# Patient Record
Sex: Female | Born: 1980 | Race: White | Hispanic: No | Marital: Married | Smoking: Never smoker
Health system: Southern US, Community
[De-identification: ages and names within clinical notes are randomized; demographics above are authoritative.]

## PROBLEM LIST (undated history)

## (undated) ENCOUNTER — Inpatient Hospital Stay (HOSPITAL_COMMUNITY): Payer: Self-pay

## (undated) DIAGNOSIS — R0602 Shortness of breath: Secondary | ICD-10-CM

## (undated) DIAGNOSIS — Z950 Presence of cardiac pacemaker: Secondary | ICD-10-CM

## (undated) DIAGNOSIS — F329 Major depressive disorder, single episode, unspecified: Secondary | ICD-10-CM

## (undated) DIAGNOSIS — E039 Hypothyroidism, unspecified: Secondary | ICD-10-CM

## (undated) DIAGNOSIS — M549 Dorsalgia, unspecified: Secondary | ICD-10-CM

## (undated) DIAGNOSIS — Z8719 Personal history of other diseases of the digestive system: Secondary | ICD-10-CM

## (undated) DIAGNOSIS — Z87898 Personal history of other specified conditions: Secondary | ICD-10-CM

## (undated) DIAGNOSIS — F439 Reaction to severe stress, unspecified: Secondary | ICD-10-CM

## (undated) DIAGNOSIS — N201 Calculus of ureter: Secondary | ICD-10-CM

## (undated) DIAGNOSIS — K219 Gastro-esophageal reflux disease without esophagitis: Secondary | ICD-10-CM

## (undated) DIAGNOSIS — I471 Supraventricular tachycardia, unspecified: Secondary | ICD-10-CM

## (undated) DIAGNOSIS — E785 Hyperlipidemia, unspecified: Secondary | ICD-10-CM

## (undated) DIAGNOSIS — R519 Headache, unspecified: Secondary | ICD-10-CM

## (undated) DIAGNOSIS — N200 Calculus of kidney: Secondary | ICD-10-CM

## (undated) DIAGNOSIS — I10 Essential (primary) hypertension: Secondary | ICD-10-CM

## (undated) DIAGNOSIS — G4730 Sleep apnea, unspecified: Secondary | ICD-10-CM

## (undated) DIAGNOSIS — R002 Palpitations: Secondary | ICD-10-CM

## (undated) DIAGNOSIS — Z973 Presence of spectacles and contact lenses: Secondary | ICD-10-CM

## (undated) DIAGNOSIS — F419 Anxiety disorder, unspecified: Secondary | ICD-10-CM

## (undated) DIAGNOSIS — Z9289 Personal history of other medical treatment: Secondary | ICD-10-CM

## (undated) DIAGNOSIS — Z8669 Personal history of other diseases of the nervous system and sense organs: Secondary | ICD-10-CM

## (undated) DIAGNOSIS — K829 Disease of gallbladder, unspecified: Secondary | ICD-10-CM

## (undated) DIAGNOSIS — G479 Sleep disorder, unspecified: Secondary | ICD-10-CM

## (undated) DIAGNOSIS — I319 Disease of pericardium, unspecified: Secondary | ICD-10-CM

## (undated) DIAGNOSIS — R55 Syncope and collapse: Secondary | ICD-10-CM

## (undated) DIAGNOSIS — Z87442 Personal history of urinary calculi: Secondary | ICD-10-CM

## (undated) DIAGNOSIS — R12 Heartburn: Secondary | ICD-10-CM

## (undated) DIAGNOSIS — F32A Depression, unspecified: Secondary | ICD-10-CM

## (undated) DIAGNOSIS — R51 Headache: Secondary | ICD-10-CM

## (undated) DIAGNOSIS — M940 Chondrocostal junction syndrome [Tietze]: Secondary | ICD-10-CM

## (undated) HISTORY — DX: Sleep disorder, unspecified: G47.9

## (undated) HISTORY — DX: Syncope and collapse: R55

## (undated) HISTORY — DX: Personal history of other diseases of the nervous system and sense organs: Z86.69

## (undated) HISTORY — DX: Palpitations: R00.2

## (undated) HISTORY — DX: Heartburn: R12

## (undated) HISTORY — DX: Shortness of breath: R06.02

## (undated) HISTORY — DX: Calculus of kidney: N20.0

## (undated) HISTORY — DX: Personal history of other diseases of the digestive system: Z87.19

## (undated) HISTORY — DX: Anxiety disorder, unspecified: F41.9

## (undated) HISTORY — PX: ABDOMINAL HYSTERECTOMY: SHX81

## (undated) HISTORY — DX: Essential (primary) hypertension: I10

## (undated) HISTORY — DX: Personal history of other medical treatment: Z92.89

## (undated) HISTORY — DX: Supraventricular tachycardia, unspecified: I47.10

## (undated) HISTORY — DX: Gastro-esophageal reflux disease without esophagitis: K21.9

## (undated) HISTORY — PX: CHOLECYSTECTOMY: SHX55

## (undated) HISTORY — DX: Supraventricular tachycardia: I47.1

## (undated) HISTORY — PX: WISDOM TOOTH EXTRACTION: SHX21

## (undated) HISTORY — DX: Dorsalgia, unspecified: M54.9

## (undated) HISTORY — DX: Chondrocostal junction syndrome (tietze): M94.0

## (undated) HISTORY — DX: Disease of pericardium, unspecified: I31.9

## (undated) HISTORY — DX: Reaction to severe stress, unspecified: F43.9

## (undated) HISTORY — DX: Personal history of other specified conditions: Z87.898

## (undated) HISTORY — DX: Disease of gallbladder, unspecified: K82.9

## (undated) HISTORY — DX: Hyperlipidemia, unspecified: E78.5

## (undated) SURGERY — Surgical Case
Anesthesia: *Unknown

---

## 1998-11-24 ENCOUNTER — Other Ambulatory Visit: Admission: RE | Admit: 1998-11-24 | Discharge: 1998-11-24 | Payer: Self-pay | Admitting: Obstetrics and Gynecology

## 2000-01-05 ENCOUNTER — Other Ambulatory Visit: Admission: RE | Admit: 2000-01-05 | Discharge: 2000-01-05 | Payer: Self-pay | Admitting: Obstetrics and Gynecology

## 2000-07-18 ENCOUNTER — Ambulatory Visit (HOSPITAL_COMMUNITY): Admission: RE | Admit: 2000-07-18 | Discharge: 2000-07-18 | Payer: Self-pay | Admitting: *Deleted

## 2001-02-28 ENCOUNTER — Other Ambulatory Visit: Admission: RE | Admit: 2001-02-28 | Discharge: 2001-02-28 | Payer: Self-pay | Admitting: Obstetrics and Gynecology

## 2001-07-12 ENCOUNTER — Emergency Department (HOSPITAL_COMMUNITY): Admission: EM | Admit: 2001-07-12 | Discharge: 2001-07-12 | Payer: Self-pay

## 2002-06-05 ENCOUNTER — Other Ambulatory Visit: Admission: RE | Admit: 2002-06-05 | Discharge: 2002-06-05 | Payer: Self-pay | Admitting: Obstetrics and Gynecology

## 2003-07-04 ENCOUNTER — Emergency Department (HOSPITAL_COMMUNITY): Admission: EM | Admit: 2003-07-04 | Discharge: 2003-07-04 | Payer: Self-pay | Admitting: Family Medicine

## 2003-07-12 ENCOUNTER — Emergency Department (HOSPITAL_COMMUNITY): Admission: EM | Admit: 2003-07-12 | Discharge: 2003-07-12 | Payer: Self-pay | Admitting: Emergency Medicine

## 2003-09-16 ENCOUNTER — Ambulatory Visit (HOSPITAL_COMMUNITY): Admission: RE | Admit: 2003-09-16 | Discharge: 2003-09-16 | Payer: Self-pay | Admitting: Family Medicine

## 2004-04-24 ENCOUNTER — Other Ambulatory Visit: Admission: RE | Admit: 2004-04-24 | Discharge: 2004-04-24 | Payer: Self-pay | Admitting: Obstetrics and Gynecology

## 2004-11-15 ENCOUNTER — Encounter (HOSPITAL_COMMUNITY): Admission: RE | Admit: 2004-11-15 | Discharge: 2004-12-13 | Payer: Self-pay | Admitting: Family Medicine

## 2004-11-18 ENCOUNTER — Emergency Department (HOSPITAL_COMMUNITY): Admission: EM | Admit: 2004-11-18 | Discharge: 2004-11-18 | Payer: Self-pay | Admitting: Emergency Medicine

## 2005-02-02 ENCOUNTER — Emergency Department (HOSPITAL_COMMUNITY): Admission: EM | Admit: 2005-02-02 | Discharge: 2005-02-02 | Payer: Self-pay | Admitting: Family Medicine

## 2005-05-04 ENCOUNTER — Other Ambulatory Visit: Admission: RE | Admit: 2005-05-04 | Discharge: 2005-05-04 | Payer: Self-pay | Admitting: Obstetrics and Gynecology

## 2005-06-07 ENCOUNTER — Encounter: Admission: RE | Admit: 2005-06-07 | Discharge: 2005-06-07 | Payer: Self-pay | Admitting: Obstetrics and Gynecology

## 2005-07-11 ENCOUNTER — Emergency Department (HOSPITAL_COMMUNITY): Admission: EM | Admit: 2005-07-11 | Discharge: 2005-07-11 | Payer: Self-pay | Admitting: Emergency Medicine

## 2005-07-17 ENCOUNTER — Ambulatory Visit (HOSPITAL_COMMUNITY): Admission: RE | Admit: 2005-07-17 | Discharge: 2005-07-17 | Payer: Self-pay | Admitting: *Deleted

## 2005-07-17 HISTORY — PX: OTHER SURGICAL HISTORY: SHX169

## 2005-07-31 ENCOUNTER — Ambulatory Visit: Payer: Self-pay | Admitting: Internal Medicine

## 2006-04-09 ENCOUNTER — Emergency Department (HOSPITAL_COMMUNITY): Admission: EM | Admit: 2006-04-09 | Discharge: 2006-04-09 | Payer: Self-pay | Admitting: Emergency Medicine

## 2006-07-15 ENCOUNTER — Ambulatory Visit (HOSPITAL_COMMUNITY): Admission: RE | Admit: 2006-07-15 | Discharge: 2006-07-15 | Payer: Self-pay | Admitting: Obstetrics and Gynecology

## 2006-07-25 ENCOUNTER — Inpatient Hospital Stay (HOSPITAL_COMMUNITY): Admission: AD | Admit: 2006-07-25 | Discharge: 2006-07-25 | Payer: Self-pay | Admitting: Obstetrics and Gynecology

## 2006-09-22 ENCOUNTER — Inpatient Hospital Stay (HOSPITAL_COMMUNITY): Admission: AD | Admit: 2006-09-22 | Discharge: 2006-09-22 | Payer: Self-pay | Admitting: Obstetrics and Gynecology

## 2006-12-18 ENCOUNTER — Inpatient Hospital Stay (HOSPITAL_COMMUNITY): Admission: AD | Admit: 2006-12-18 | Discharge: 2006-12-21 | Payer: Self-pay | Admitting: Obstetrics and Gynecology

## 2006-12-24 ENCOUNTER — Ambulatory Visit: Admission: RE | Admit: 2006-12-24 | Discharge: 2006-12-24 | Payer: Self-pay | Admitting: Obstetrics and Gynecology

## 2007-07-06 ENCOUNTER — Emergency Department (HOSPITAL_COMMUNITY): Admission: EM | Admit: 2007-07-06 | Discharge: 2007-07-06 | Payer: Self-pay | Admitting: Family Medicine

## 2007-11-24 HISTORY — PX: CHOLECYSTECTOMY: SHX55

## 2007-12-10 ENCOUNTER — Encounter (INDEPENDENT_AMBULATORY_CARE_PROVIDER_SITE_OTHER): Payer: Self-pay | Admitting: General Surgery

## 2007-12-10 ENCOUNTER — Ambulatory Visit (HOSPITAL_COMMUNITY): Admission: RE | Admit: 2007-12-10 | Discharge: 2007-12-10 | Payer: Self-pay | Admitting: General Surgery

## 2007-12-18 ENCOUNTER — Encounter: Admission: RE | Admit: 2007-12-18 | Discharge: 2007-12-18 | Payer: Self-pay | Admitting: General Surgery

## 2007-12-19 ENCOUNTER — Encounter: Admission: RE | Admit: 2007-12-19 | Discharge: 2007-12-19 | Payer: Self-pay | Admitting: General Surgery

## 2007-12-24 ENCOUNTER — Ambulatory Visit (HOSPITAL_COMMUNITY): Admission: RE | Admit: 2007-12-24 | Discharge: 2007-12-24 | Payer: Self-pay | Admitting: Gastroenterology

## 2008-01-25 ENCOUNTER — Emergency Department (HOSPITAL_BASED_OUTPATIENT_CLINIC_OR_DEPARTMENT_OTHER): Admission: EM | Admit: 2008-01-25 | Discharge: 2008-01-25 | Payer: Self-pay | Admitting: Emergency Medicine

## 2010-07-16 ENCOUNTER — Encounter: Payer: Self-pay | Admitting: Obstetrics and Gynecology

## 2010-07-17 ENCOUNTER — Encounter: Payer: Self-pay | Admitting: Gastroenterology

## 2010-09-13 ENCOUNTER — Ambulatory Visit (INDEPENDENT_AMBULATORY_CARE_PROVIDER_SITE_OTHER): Payer: Self-pay | Admitting: Cardiovascular Disease

## 2010-09-13 ENCOUNTER — Encounter: Payer: Self-pay | Admitting: Cardiovascular Disease

## 2010-09-13 ENCOUNTER — Ambulatory Visit: Payer: Self-pay | Admitting: *Deleted

## 2010-09-13 ENCOUNTER — Encounter: Payer: Self-pay | Admitting: *Deleted

## 2010-09-13 VITALS — BP 126/88 | HR 64 | Wt 175.0 lb

## 2010-09-13 DIAGNOSIS — I319 Disease of pericardium, unspecified: Secondary | ICD-10-CM

## 2010-09-13 DIAGNOSIS — I10 Essential (primary) hypertension: Secondary | ICD-10-CM | POA: Insufficient documentation

## 2010-09-13 DIAGNOSIS — M940 Chondrocostal junction syndrome [Tietze]: Secondary | ICD-10-CM

## 2010-09-13 DIAGNOSIS — R55 Syncope and collapse: Secondary | ICD-10-CM

## 2010-09-13 HISTORY — DX: Disease of pericardium, unspecified: I31.9

## 2010-09-13 HISTORY — DX: Chondrocostal junction syndrome (tietze): M94.0

## 2010-09-13 MED ORDER — HYDROCHLOROTHIAZIDE 12.5 MG PO CAPS: 12.5000 mg | ORAL_CAPSULE | Freq: Every day | ORAL | Status: DC

## 2010-09-13 NOTE — Progress Notes (Signed)
Subjective: Carmen [name] is a young female who we are asked to see today for episodes of chest pain. She is a previous patient of Dr. Reyes Ivan.She started having episodes of chest pain several days ago. The chest pain was described as a heaviness and was centered around her left breast. She originally thought she had some breast tenderness and started taking some vitamin E. The pain has persisted although it is a little bit better. It is exacerbated by lying back and is easily she leans forwards.It was associated with dyspnea.  She denies any PND or orthopnea. She denies any syncope or presyncope.  Current Outpatient Prescriptions  Medication Sig Dispense Refill  . aspirin 81 MG tablet Take 81 mg by mouth daily.        . calcium-vitamin D (OSCAL WITH D) 500-200 MG-UNIT per tablet Take 1 tablet by mouth 2 (two) times daily.        . diclofenac (VOLTAREN) 75 MG EC tablet Take 75 mg by mouth 2 (two) times daily.        . fish oil-omega-3 fatty acids 1000 MG capsule Take 2 g by mouth 3 (three) times daily.        . metoprolol (TOPROL-XL) 50 MG 24 hr tablet Take 50 mg by mouth daily.        . multivitamin (THERAGRAN) per tablet Take 1 tablet by mouth daily.        Marland Kitchen omeprazole (PRILOSEC) 40 MG capsule Take 40 mg by mouth daily.        . SUMAtriptan (IMITREX) 50 MG tablet Take 50 mg by mouth every 2 (two) hours as needed.        . vitamin E 400 UNIT capsule Take 400 Units by mouth daily.        Marland Kitchen DISCONTD: carvedilol (COREG) 12.5 MG tablet Take 12.5 mg by mouth 2 (two) times daily with a meal.         Allergies  Allergen Reactions  . Quinolones Nausea Only    Patient Active Problem List  Diagnoses  . Pericarditis  . Hypertension  . Syncope  . Costochondritis    History  Smoking status  . Never Smoker   Smokeless tobacco  . Not on file    History  Alcohol Use No    Family History  Problem Relation Age of Onset  . Hypertension Mother   . Heart disease Mother   . Heart attack Father    . Hyperlipidemia Father     Review of Systems: The patient denies any heat or cold intolerance.  No weight gain or weight loss.  The patient denies headaches or blurry vision.  There is no cough or sputum production.  The patient denies dizziness.  There is no hematuria or hematochezia.  The patient denies any muscle aches or arthritis.  The patient denies any rash.  The patient denies frequent falling or instability.  There is no history of depression or anxiety.  All other systems were reviewed and are negative.  Physical Exam: The patient is alert and oriented x 3.  The mood and affect are normal.  The skin is warm and dry.  Color is normal.  The HEENT exam reveals that the sclera are nonicteric.  The mucous membranes are moist.  The carotids are 2+ without bruits.  There is no thyromegaly.  There is no JVD.  The lungs are clear.  The chest wall Is very tender especially along her sternum.  The heart exam reveals a  regular rate with a normal S1 and S2.  There are no murmurs, gallops, or rubs.  The PMI is not displaced.   Abdominal exam reveals good bowel sounds.  There is no guarding or rebound.  There is no hepatosplenomegaly or tenderness.  There are no masses.  Exam of the legs reveal no clubbing, cyanosis, or edema.  The legs are without rashes.  The distal pulses are intact.  Cranial nerves II - XII are intact.  Motor and sensory functions are intact.  The gait is normal.  ECG: Normal sinus rhythm. She has no ST or T wave changes.  Assessment / Plan:

## 2010-09-13 NOTE — Assessment & Plan Note (Signed)
Carmen [name] presents with chest wall tenderness and I suspect that she has costochondritis.Her symptoms certainly sound like pericarditis from last week although she does not have any PR depression and ST segment elevation on her EKG today. I suspect that this costochondritis will gradually resolve. She is already on Voltaren. I'll see her back in a month.

## 2010-09-13 NOTE — Assessment & Plan Note (Signed)
Her diastolic pressure is a little bit high today and she states that it typically is in the 90 range. We'll add HCTZ 12.5 mg a day. I'll see her back in one month for an office visit and complete metabolic profile.

## 2010-09-13 NOTE — Assessment & Plan Note (Signed)
She had symptoms consistent with pericarditis last week. I think that her chest pain today is no due to costochondritis. We'll continue to follow her closely for this.

## 2010-10-12 ENCOUNTER — Other Ambulatory Visit: Payer: Self-pay | Admitting: *Deleted

## 2010-10-12 DIAGNOSIS — E7800 Pure hypercholesterolemia, unspecified: Secondary | ICD-10-CM

## 2010-10-13 ENCOUNTER — Ambulatory Visit: Payer: Self-pay | Admitting: Cardiovascular Disease

## 2010-10-16 ENCOUNTER — Other Ambulatory Visit (INDEPENDENT_AMBULATORY_CARE_PROVIDER_SITE_OTHER): Payer: Managed Care, Other (non HMO) | Admitting: *Deleted

## 2010-10-16 ENCOUNTER — Ambulatory Visit (INDEPENDENT_AMBULATORY_CARE_PROVIDER_SITE_OTHER): Payer: Managed Care, Other (non HMO) | Admitting: Cardiovascular Disease

## 2010-10-16 ENCOUNTER — Encounter: Payer: Self-pay | Admitting: Cardiovascular Disease

## 2010-10-16 ENCOUNTER — Other Ambulatory Visit (INDEPENDENT_AMBULATORY_CARE_PROVIDER_SITE_OTHER): Payer: PRIVATE HEALTH INSURANCE | Admitting: Cardiovascular Disease

## 2010-10-16 VITALS — BP 120/90 | HR 86 | Ht 63.0 in | Wt 177.4 lb

## 2010-10-16 DIAGNOSIS — E785 Hyperlipidemia, unspecified: Secondary | ICD-10-CM

## 2010-10-16 DIAGNOSIS — E7800 Pure hypercholesterolemia, unspecified: Secondary | ICD-10-CM

## 2010-10-16 DIAGNOSIS — I10 Essential (primary) hypertension: Secondary | ICD-10-CM

## 2010-10-16 LAB — BASIC METABOLIC PANEL
BUN: 18 mg/dL (ref 6–23)
CO2: 29 mEq/L (ref 19–32)
Calcium: 9.6 mg/dL (ref 8.4–10.5)
Chloride: 100 mEq/L (ref 96–112)
Creatinine, Ser: 0.7 mg/dL (ref 0.4–1.2)
GFR: 112.56 mL/min (ref >60.00)
Glucose, Bld: 80 mg/dL (ref 70–99)
Potassium: 4 mEq/L (ref 3.5–5.1)
Sodium: 136 mEq/L (ref 135–145)

## 2010-10-16 LAB — HEPATIC FUNCTION PANEL
ALT: 20 U/L (ref 0–35)
AST: 22 U/L (ref 0–37)
Albumin: 4.2 g/dL (ref 3.5–5.2)
Alkaline Phosphatase: 56 U/L (ref 39–117)
Bilirubin, Direct: 0 mg/dL (ref 0.0–0.3)
Total Bilirubin: 0.6 mg/dL (ref 0.3–1.2)
Total Protein: 7.5 g/dL (ref 6.0–8.3)

## 2010-10-16 LAB — LIPID PANEL
Cholesterol: 203 mg/dL — ABNORMAL HIGH (ref 0–200)
HDL: 36.7 mg/dL — ABNORMAL LOW (ref >39.00)
Total CHOL/HDL Ratio: 6
Triglycerides: 231 mg/dL — ABNORMAL HIGH (ref 0.0–149.0)
VLDL: 46.2 mg/dL — ABNORMAL HIGH (ref 0.0–40.0)

## 2010-10-16 LAB — LDL CHOLESTEROL, DIRECT: Direct LDL: 136.1 mg/dL

## 2010-10-16 MED ORDER — POTASSIUM CHLORIDE ER 10 MEQ PO TBCR: 10.0000 meq | EXTENDED_RELEASE_TABLET | Freq: Two times a day (BID) | ORAL | Status: DC

## 2010-10-16 MED ORDER — HYDROCHLOROTHIAZIDE 25 MG PO TABS: 25.0000 mg | ORAL_TABLET | Freq: Every day | ORAL | Status: DC

## 2010-10-16 NOTE — Assessment & Plan Note (Signed)
Carmen [name] presents with some persistently elevated blood pressure.  She's been on low-dose HCTZ for the past month.  I would like to check a basic metabolic profile today as well as a lipid profile and hepatic profile.  We will increase her HCTZ to 25 mg a day. We will also add potassium chloride 10 mEq a day. I'll see her back in the office in 3 months for office visit and basic metabolic profile.

## 2010-10-16 NOTE — Progress Notes (Signed)
[name] Date of Birth  May 30, 1981 [facility] [address]     [address] [place], [place]  [address] [phone]  Fax  [phone]  History of Present Illness:  Carmen [name]  is a young female with a history of hypertension and history of chest pains.  She has not had any further episodes of chest pain since I last saw her a month ago. Her blood pressure has remained a little bit elevated. She continues to walk at lunch about 1.2 miles every day. She complains of back and leg pain.   Current Outpatient Prescriptions on File Prior to Visit  Medication Sig Dispense Refill  . aspirin 81 MG tablet Take 81 mg by mouth daily.        . calcium-vitamin D (OSCAL WITH D) 500-200 MG-UNIT per tablet Take 1 tablet by mouth 2 (two) times daily.        . diclofenac (VOLTAREN) 75 MG EC tablet Take 75 mg by mouth 2 (two) times daily.        . fish oil-omega-3 fatty acids 1000 MG capsule Take 2 g by mouth 3 (three) times daily.        . metoprolol (TOPROL-XL) 50 MG 24 hr tablet Take 50 mg by mouth daily.        . multivitamin (THERAGRAN) per tablet Take 1 tablet by mouth daily.        Marland Kitchen omeprazole (PRILOSEC) 40 MG capsule Take 40 mg by mouth daily.        . SUMAtriptan (IMITREX) 50 MG tablet Take 50 mg by mouth every 2 (two) hours as needed.        . vitamin E 400 UNIT capsule Take 400 Units by mouth daily.        Marland Kitchen DISCONTD: hydrochlorothiazide (,MICROZIDE/HYDRODIURIL,) 12.5 MG capsule Take 1 capsule (12.5 mg total) by mouth daily.  30 capsule  11    Allergies  Allergen Reactions  . Quinolones Nausea Only    Past Medical History  Diagnosis Date  . Syncope and collapse     neurocardiogenic  . Hypertension     Past Surgical History  Procedure Date  . Cholecystectomy     11/2007    History  Smoking status  . Never Smoker   Smokeless tobacco  . Not on file    History  Alcohol Use No    Family History  Problem Relation Age of Onset  .  Hypertension Mother   . Heart disease Mother   . Heart attack Father   . Hyperlipidemia Father     Reviw of Systems:  Reviewed in the HPI.  All other systems are negative.  Physical Exam: BP 120/90  Pulse 86  Ht 5\' 3"  (1.6 m)  Wt 177 lb 6.4 oz (80.468 kg)  BMI 31.42 kg/m2  LMP 10/07/2010 The patient is alert and oriented x 3.  The mood and affect are normal.  The skin is warm and dry.  Color is normal.  The HEENT exam reveals that the sclera are nonicteric.  The mucous membranes are moist.  The carotids are 2+ without bruits.  There is no thyromegaly.  There is no JVD.  The lungs are clear.  The chest wall is non tender.  The heart exam reveals a regular rate with a normal S1 and S2.  There are no murmurs, gallops, or rubs.  The PMI is not displaced.   Abdominal exam reveals good bowel sounds.  There is no guarding or rebound.  There is no hepatosplenomegaly or tenderness.  There are no masses.  Exam of the legs reveal no clubbing, cyanosis, or edema.  The legs are without rashes.  The distal pulses are intact.  Cranial nerves II - XII are intact.  Motor and sensory functions are intact.  The gait is normal.  Assessment / Plan:

## 2010-10-17 ENCOUNTER — Telehealth: Payer: Self-pay | Admitting: *Deleted

## 2010-10-17 DIAGNOSIS — E785 Hyperlipidemia, unspecified: Secondary | ICD-10-CM

## 2010-10-17 NOTE — Telephone Encounter (Signed)
Message copied by Mahalia Longest on Tue Oct 17, 2010  4:33 PM ------      Message from: [place], [place]      Created: Mon Oct 16, 2010  4:30 PM       Continue a good diet and exercise.  Recheck in 6 months.

## 2010-10-25 ENCOUNTER — Telehealth: Payer: Self-pay | Admitting: Cardiovascular Disease

## 2010-10-25 ENCOUNTER — Telehealth: Payer: Self-pay | Admitting: *Deleted

## 2010-10-25 NOTE — Telephone Encounter (Signed)
Pt called and she will call me back as she is with a pt. jodette Cherrill Scrima rn

## 2010-10-25 NOTE — Telephone Encounter (Signed)
Pt having a hard time taking deep breath since she was started on potassium with the increased HCTZ. She understands that SOB isn't a side effect of K+ but wanted your opinion. I asked her to get a bp and pulse, she stated her pulse has been normal and will have someone check her BP. Please  Advise, jodette

## 2010-10-25 NOTE — Telephone Encounter (Signed)
Pt to come in am between 12 and 1pm tomorrow for spo2 check for c/o "cant get a good breathe. Pt will call before coming to make sure he is here. Alfonso Ramus RN

## 2010-10-25 NOTE — Telephone Encounter (Signed)
HAVE PATIENT OVER HEAD PAGED, POTASSIUM QUESTION/SOB SOMETIMES. CHART PLACED IN BOX.

## 2010-10-25 NOTE — Telephone Encounter (Signed)
I dont think her dyspnea is due to the HCTZ and KCl.  ? Due to the weather / bronchospasm / high ozone levels. We can check her O2 sat tomorrow in the afternoon with ambulation.  Check BP and HR as you have already suggested.  I can see her Thursday  Am for ov if needed.

## 2010-10-26 ENCOUNTER — Ambulatory Visit: Payer: Managed Care, Other (non HMO) | Admitting: Cardiovascular Disease

## 2010-10-26 ENCOUNTER — Encounter: Payer: Self-pay | Admitting: *Deleted

## 2010-10-26 DIAGNOSIS — I471 Supraventricular tachycardia: Secondary | ICD-10-CM

## 2010-10-26 DIAGNOSIS — G479 Sleep disorder, unspecified: Secondary | ICD-10-CM | POA: Insufficient documentation

## 2010-10-26 DIAGNOSIS — R002 Palpitations: Secondary | ICD-10-CM

## 2010-10-26 DIAGNOSIS — R55 Syncope and collapse: Secondary | ICD-10-CM | POA: Insufficient documentation

## 2010-11-07 NOTE — Op Note (Signed)
NAMELAWANDA, HOLZHEIMER                 ACCOUNT NO.:  [id number]   MEDICAL RECORD NO.:  [id number]          PATIENT TYPE:  AMB   LOCATION:  SDS                          FACILITY:  [facility]   PHYSICIAN:  Sharlet Salina T. Hoxworth, M.D.DATE OF BIRTH:  02-03-1981   DATE OF PROCEDURE:  12/10/2007  DATE OF DISCHARGE:  12/10/2007                               OPERATIVE REPORT   PREOPERATIVE DIAGNOSIS:  Cholecystitis.   POSTOPERATIVE DIAGNOSIS:  Cholecystitis.   SURGICAL PROCEDURES:  Laparoscopic cholecystectomy with intraoperative  cholangiogram.   SURGEON:  Sharlet Salina T. Hoxworth, MD   ASSISTANT:  Magnus Ivan, RNFA   BRIEF HISTORY:  Ms. Bishop Limbo is a 30 year old female with repeated episodic  epigastric right upper quadrant abdominal pain and nausea.  She has had  a thorough workup including a CT scan that suggested possible  cholelithiasis, although abdominal ultrasound was negative.  The HIDA  scan was equivocal.  She has a strong family history of gallbladder  disease and her symptoms developed during pregnancy.  Due to the strong  clinical suspicion for cholecystitis and small gallstones, I recommended  proceeding with laparoscopic cholecystectomy with cholangiogram.  The  nature of the procedure, its indications, risks of bleeding, infection,  bile leak, and bile duct injury, and failure to relieve symptoms were  discussed and understood.  The patient was then brought to the operating  room for this procedure.   DESCRIPTION OF OPERATION:  The patient was brought to the operating room  and placed in the supine position on the operating table, and general  orotracheal anesthesia was induced.  The abdomen was widely sterilely  prepped and draped.  PAS were placed.  Correct patient and procedure  were verified.  She received preoperative antibiotics.  Local anesthesia  was used to infiltrate the trocar sites prior to the incisions.  Access  was obtained with a 1-cm open Hasson technique at the  umbilicus.  Standard four-port technique was used.  The gallbladder was visualized  and was not severely inflamed, but did have evidence of cholesterolosis.  The fundus was grasped and elevated above the liver and the infundibulum  retracted inferolaterally.  Peritoneum anterior and posterior to Calot  triangle was incised, and fibrofatty tissue was stripped off the neck of  the gallbladder toward the porta hepatis.  Calot triangle was thoroughly  dissected.  The cystic artery was identified coursing up out of the  gallbladder wall.  The distal gallbladder and cystic duct were  skeletonized and the cystic duct gallbladder junction dissected 360  degrees.  When the anatomy appeared clear, the cystic artery was singly  clipped as was the cystic duct at the gallbladder junction and an  operative cholangiogram obtained through the cystic duct.  This showed  good filling of a small delicate common bile duct and intrahepatic ducts  with free flow into the duodenum and no filling defects.  Following  this, the cholangiocatheter was removed, and the cystic duct was doubly  clipped proximally and divided.  The cystic artery was further clipped  distally and divided.  The gallbladder was then dissected free from  its  bed using hook cautery and removed through the umbilicus.  Complete  hemostasis was obtained in the gallbladder bed and the abdomen was  irrigated until clear.  There was no evidence of trocar injury.  All CO2  was evacuated, trocars were  removed, and the mattress sutures were secured to the umbilicus.  Skin  incisions were closed with subcuticular Monocryl and Dermabond.  Sponge,  needle, and instrument counts were correct.  The patient was taken to  recovery in good condition.      Lorne Skeens. Hoxworth, M.D.  Electronically Signed     BTH/MEDQ  D:  12/10/2007  T:  12/11/2007  Job:  [id number]

## 2010-11-10 NOTE — Cardiovascular Report (Signed)
Carmen [name], Carmen [name] NO.:  [id number]   MEDICAL RECORD NO.:  [id number]          PATIENT TYPE:  OIB   LOCATION:  2899                         FACILITY:  [facility]   PHYSICIAN:  Elmore Guise., M.D.DATE OF BIRTH:  1980-09-06   DATE OF PROCEDURE:  07/17/2005  DATE OF DISCHARGE:  07/17/2005                              CARDIAC CATHETERIZATION   INDICATIONS FOR PROCEDURE:  Syncopal episode   DESCRIPTION OF PROCEDURE:  The procedure risks and benefits were explained  to the patient at length and informed consent was obtained. The patient was  brought to the cardiac cath lab in a fasting and nonsedated state. She had a  peripheral IV in her right forearm area. She was placed on blood pressure,  ECG and pulse oximetry monitoring. Baseline recordings were then recorded in  the supine position for 5 minutes. The patient was then placed in a 70  degree head up tilt position and monitored.   RESULTS:  Baseline tilt:  1.  Baseline supine vital signs showed blood pressure ranging from 127-      131/65-80 with heart rate to be in normal sinus rhythm with a low of 68      and a high of 70. Her oxygen saturations were 100%  2.  With the 70 degree tilt, her initial vital signs were blood pressure      117/68 and heart rate of 71, in normal sinus rhythm with O2 saturations      to be at 95%. She stay in the upright position. After 10 minutes, her      blood pressure remained 117/63, heart rate of 77. At 20 minutes, her      blood pressure was 110/49, heart rate of 77. At 21 minutes, the patient      started to feel clammy and weird.  Her blood pressure was 100/48 and      her heart rate had decreased down to 58 beats per minute in a sinus      bradycardia. At 1 minute later, her blood pressure was 105/60. She      continued to have sinus bradycardia down to 40 beats per minute. This      was followed by an unresponsive episode and a significant sinus pause up      to 8-10  seconds. The patient did receive transcutaneous pacing for      approximately 5 seconds. She was placed immediately in the supine      position. Her heart rate responded appropriately back to 80 beats per      minute and her blood pressure 1 minute later was 136/86. The patient was      without complaint back to her normal mental state. Her blood pressure      was 119/64, heart rate of 75 in normal sinus rhythm. Procedure was      stopped after the patient was observed for approximately 20 minutes      following her syncopal spell.   IMPRESSION:  Positive tilt table study with significant cardioinhibitory  response.   PLAN:  At  this time, we will titrate off her Toprol. We will add Florinef  and Zoloft to her regimen. I have asked her to restrict her driving until  she is given the okay to resume her normal activities. She will have a  follow-up event monitor as well as repeat tilt in four weeks. Should she  have any further bradycardia or a significant sinus pauses, we also  discussed the potential for permanent pacemaker implant.      Elmore Guise., M.D.  Electronically Signed     TWK/MEDQ  D:  07/17/2005  T:  07/18/2005  Job:  [id number]

## 2010-11-10 NOTE — Consult Note (Signed)
NAME:  Carmen [name], Carmen [name]                       ACCOUNT NO.:  [id number]   MEDICAL RECORD NO.:  [id number]                   PATIENT TYPE:  EMS   LOCATION:  MAJO                                 FACILITY:  [facility]   PHYSICIAN:  Doylene Canning. Ladona Ridgel, M.D.               DATE OF BIRTH:  Jan 07, 1981   DATE OF CONSULTATION:  07/12/2003  DATE OF DISCHARGE:                                   CONSULTATION   Consultation requested by Dr. Nanetta Batty.   INDICATIONS FOR CONSULTATION:  Evaluation of tachypalpitations and  supraventricular tachycardia.   HISTORY OF PRESENT ILLNESS:  The patient is a 30 year old young lady with a  history of hypertension.  She has had tachypalpitations for at least a year,  perhaps longer.  She also notes that she has felt weak and fatigued and  tired for the last 6-8 months.  She initially noted severe palpitations when  she had a head cold.  At that time, she had an episode of frank syncope.  She continued to have tachypalpitations and pulse rates in the 140-150 beats  per minute range.  The patient works as a Pharmacologist.  She was  subsequently seen by Dr. Nanetta Batty for evaluation and had a cardiac  monitor worn.  This has demonstrated an episode of what appears to be a long  RP tachycardia (sinus tachycardia versus atrial tachycardia versus unusual  AV NRT back on January 11).  This tachycardia is interesting in that it is a  regular narrow tachycardia at a rate of approximately 160 beats per minute.  There appear to be P waves in front of each QRS apparently.  Interestingly,  when this occurred the patient was not doing any extreme exertion.  With the  palpitations, she feels fatigued.  She is not certain that these start and  stop suddenly and certainly stop gradually.  At times, there is associated  nausea and diaphoresis.  She does have palpitations but these are not  severe.  In the past, she has had three or four or more caffeinated  beverages a  day though she has tried to cut back on these.   PAST MEDICAL HISTORY:  1. Hypertension.  She is currently on an ACE inhibitor.  2. She has a remote history of seizure activity and was evaluated at [facility] for this in the past.  She is presently not on any seizure     medications and has not had any seizures for many years.   FAMILY HISTORY:  Notable for a father dying of an MI at age 25.   SOCIAL HISTORY:  The patient is single.  She works at [employer].  She  has a boyfriend.  She denies tobacco or ethanol abuse though she does use up  to caffeinated beverages per day.   REVIEW OF SYSTEMS:  Notable for no fevers, chills, or night sweats.  No  vision or hearing problems.  She does note an approximate 20-pound weight  gain in the last eight months.  She denies any skin rashes or skin problems.  She denies chest pain but does have shortness of breath and dyspnea on  exertion.  She has had one episode of syncope and palpitations.  She denies  arthralgias or joint pain and swelling.  She denies dysuria, hematuria, or  nocturia.  She denies weakness, numbness, depression.  She denies nausea,  vomiting, diarrhea, or constipation at present.  She denies heat or cold  intolerance and has had normal thyroid study.   PHYSICAL EXAMINATION:  GENERAL:  She is a pleasant, well-appearing young  woman in no distress.  VITAL SIGNS:  Blood pressure was 162/97, the pulse was 100 and regular, the  respirations were 18, temperature is 98.5.  HEENT:  Normocephalic and atraumatic.  Pupils equal and round.  The  oropharynx was moist.  The sclerae were anicteric.  NECK:  No jugular venous distention.  There was no thyromegaly.  The trachea  was midline and carotids were 2+ and symmetric.  LUNGS:  Clear bilaterally to auscultation.  There were no wheezes, rales, or  rhonchi.  CARDIOVASCULAR:  Regular rate and rhythm with normal S1 and S2.  ABDOMEN:  Soft, nontender, nondistended.  There was no  organomegaly.  EXTREMITIES:  No cyanosis, clubbing, or edema.  NEUROLOGIC:  She was alert and oriented x3 with cranial nerves II-XII  grossly intact.  Her strength was 5/5 and symmetric.   EKG at baseline demonstrates a normal sinus rhythm with no ventricular  preexcitation.   IMPRESSION:  1. Hypertension.  2. Recurrent tachypalpitations with a documented narrow complex tachycardia     at a rate of approximately 160 beats per minute.  3. Syncope as previously noted, most likely neurally mediated.   DISCUSSION:  The patient's tachypalpitations are most likely due to sinus  tachycardia.  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  There is also the  possibility of an atrial tachycardia.  I discussed treatment options with  the patient.  I agree with Dr. Hazle Coca recommendations for continuing her  calcium channel blockers and starting low-dose beta-blockers to see if her  heart rate can be under better control.  If she is intolerant of these  medications or having other problems, then I would be happy to see her back  on an as-needed basis.  Ultimately, electrophysiology study may be required  to determine what the mechanism of her arrhythmia is and if possible  catheter ablation.                                               Doylene Canning. Ladona Ridgel, M.D.    GWT/MEDQ  D:  07/12/2003  T:  07/12/2003  Job:  [id number]   cc:   Kathrine Cords, R.N. [facility]

## 2010-12-22 ENCOUNTER — Other Ambulatory Visit: Payer: Self-pay | Admitting: Family Medicine

## 2010-12-22 DIAGNOSIS — R51 Headache: Secondary | ICD-10-CM

## 2010-12-26 ENCOUNTER — Ambulatory Visit
Admission: RE | Admit: 2010-12-26 | Discharge: 2010-12-26 | Disposition: A | Discharge status: Home / Self Care | Payer: PRIVATE HEALTH INSURANCE | Source: Ambulatory Visit | Attending: Family Medicine | Admitting: Family Medicine

## 2010-12-26 DIAGNOSIS — R51 Headache: Secondary | ICD-10-CM

## 2011-02-28 ENCOUNTER — Other Ambulatory Visit: Payer: Self-pay | Admitting: Family Medicine

## 2011-02-28 DIAGNOSIS — Z1231 Encounter for screening mammogram for malignant neoplasm of breast: Secondary | ICD-10-CM

## 2011-02-28 DIAGNOSIS — Z803 Family history of malignant neoplasm of breast: Secondary | ICD-10-CM

## 2011-03-08 ENCOUNTER — Ambulatory Visit: Payer: PRIVATE HEALTH INSURANCE

## 2011-03-12 ENCOUNTER — Ambulatory Visit
Admission: RE | Admit: 2011-03-12 | Discharge: 2011-03-12 | Disposition: A | Discharge status: Home / Self Care | Payer: PRIVATE HEALTH INSURANCE | Source: Ambulatory Visit | Attending: Family Medicine | Admitting: Family Medicine

## 2011-03-12 DIAGNOSIS — Z803 Family history of malignant neoplasm of breast: Secondary | ICD-10-CM

## 2011-03-12 DIAGNOSIS — Z1231 Encounter for screening mammogram for malignant neoplasm of breast: Secondary | ICD-10-CM

## 2011-03-13 ENCOUNTER — Ambulatory Visit: Payer: PRIVATE HEALTH INSURANCE

## 2011-03-22 LAB — COMPREHENSIVE METABOLIC PANEL
ALT: 18
AST: 17
Albumin: 4.3
Alkaline Phosphatase: 59
BUN: 11
CO2: 24
Calcium: 9.5
Chloride: 111
Creatinine, Ser: 0.78
GFR calc Af Amer: >60
GFR calc non Af Amer: >60
Glucose, Bld: 92
Potassium: 4.2
Sodium: 140
Total Bilirubin: 0.6
Total Protein: 7.5

## 2011-03-22 LAB — URINALYSIS, ROUTINE W REFLEX MICROSCOPIC
Bilirubin Urine: NEGATIVE
Glucose, UA: NEGATIVE
Hgb urine dipstick: NEGATIVE
Ketones, ur: NEGATIVE
Nitrite: NEGATIVE
Protein, ur: NEGATIVE
Specific Gravity, Urine: 1.02
Urobilinogen, UA: 0.2
pH: 6.5

## 2011-03-22 LAB — DIFFERENTIAL
Basophils Absolute: 0
Basophils Relative: 1
Eosinophils Absolute: 0.2
Eosinophils Relative: 3
Lymphocytes Relative: 26
Lymphs Abs: 1.9
Monocytes Absolute: 0.4
Monocytes Relative: 6
Neutro Abs: 4.8
Neutrophils Relative %: 66

## 2011-03-22 LAB — URINE MICROSCOPIC-ADD ON

## 2011-03-22 LAB — CBC
HCT: 42
Hemoglobin: 14.8
MCHC: 35.2
MCV: 89.6
Platelets: 231
RBC: 4.69
RDW: 12.2
WBC: 7.3

## 2011-03-23 LAB — DIFFERENTIAL
Basophils Absolute: 0
Basophils Relative: 0
Eosinophils Absolute: 0.2
Eosinophils Relative: 3
Lymphocytes Relative: 34
Lymphs Abs: 2.5
Monocytes Absolute: 0.5
Monocytes Relative: 6
Neutro Abs: 4.1
Neutrophils Relative %: 56

## 2011-03-23 LAB — URINALYSIS, ROUTINE W REFLEX MICROSCOPIC
Bilirubin Urine: NEGATIVE
Glucose, UA: NEGATIVE
Ketones, ur: NEGATIVE
Nitrite: POSITIVE — AB
Protein, ur: NEGATIVE
Specific Gravity, Urine: 1.024
Urobilinogen, UA: 1
pH: 5.5

## 2011-03-23 LAB — COMPREHENSIVE METABOLIC PANEL
ALT: 19
AST: 37
Albumin: 4.9
Alkaline Phosphatase: 69
BUN: 18
CO2: 21
Calcium: 9.8
Chloride: 109
Creatinine, Ser: 0.8
GFR calc Af Amer: >60
GFR calc non Af Amer: >60
Glucose, Bld: 89
Potassium: 4
Sodium: 143
Total Bilirubin: 0.5
Total Protein: 8.4 — ABNORMAL HIGH

## 2011-03-23 LAB — URINE MICROSCOPIC-ADD ON

## 2011-03-23 LAB — CBC
HCT: 42.8
Hemoglobin: 14.8
MCHC: 34.6
MCV: 89.8
Platelets: 212
RBC: 4.77
RDW: 12.2
WBC: 7.3

## 2011-03-23 LAB — POCT CARDIAC MARKERS
CKMB, poc: <1 — ABNORMAL LOW
Myoglobin, poc: 31.7
Troponin i, poc: <0.05

## 2011-03-23 LAB — URINE CULTURE: Colony Count: 60000

## 2011-03-23 LAB — PREGNANCY, URINE: Preg Test, Ur: NEGATIVE

## 2011-03-23 LAB — LIPASE, BLOOD: Lipase: 287

## 2011-04-02 ENCOUNTER — Ambulatory Visit
Admission: RE | Admit: 2011-04-02 | Discharge: 2011-04-02 | Disposition: A | Discharge status: Home / Self Care | Payer: PRIVATE HEALTH INSURANCE | Source: Ambulatory Visit | Attending: Family Medicine | Admitting: Family Medicine

## 2011-04-02 ENCOUNTER — Other Ambulatory Visit: Payer: Self-pay | Admitting: Family Medicine

## 2011-04-02 DIAGNOSIS — N63 Unspecified lump in unspecified breast: Secondary | ICD-10-CM

## 2011-04-11 LAB — CBC
HCT: 28.8 — ABNORMAL LOW
HCT: 30.2 — ABNORMAL LOW
HCT: 34.5 — ABNORMAL LOW
Hemoglobin: 10.3 — ABNORMAL LOW
Hemoglobin: 11.7 — ABNORMAL LOW
Hemoglobin: 9.9 — ABNORMAL LOW
MCHC: 33.8
MCHC: 34
MCHC: 34.4
MCV: 87.8
MCV: 88
MCV: 88.6
Platelets: 194
Platelets: 228
Platelets: 241
RBC: 3.29 — ABNORMAL LOW
RBC: 3.41 — ABNORMAL LOW
RBC: 3.92
RDW: 13.5
RDW: 13.8
RDW: 14
WBC: 10.3
WBC: 15.8 — ABNORMAL HIGH
WBC: 9.9

## 2011-04-11 LAB — RPR: RPR Ser Ql: NONREACTIVE

## 2011-04-11 LAB — COMPREHENSIVE METABOLIC PANEL
ALT: 15
AST: 23
Albumin: 2.4 — ABNORMAL LOW
Alkaline Phosphatase: 72
BUN: 5 — ABNORMAL LOW
CO2: 24
Calcium: 8.6
Chloride: 110
Creatinine, Ser: 0.59
GFR calc Af Amer: >60
GFR calc non Af Amer: >60
Glucose, Bld: 118 — ABNORMAL HIGH
Potassium: 3.3 — ABNORMAL LOW
Sodium: 140
Total Bilirubin: 0.2 — ABNORMAL LOW
Total Protein: 5.7 — ABNORMAL LOW

## 2011-04-11 LAB — LACTATE DEHYDROGENASE: LDH: 214

## 2011-04-11 LAB — URIC ACID: Uric Acid, Serum: 5.2

## 2011-10-26 ENCOUNTER — Emergency Department
Admission: EM | Admit: 2011-10-26 | Discharge: 2011-10-26 | Disposition: A | Discharge status: Home / Self Care | Payer: PRIVATE HEALTH INSURANCE | Source: Home / Self Care | Attending: Family Medicine | Admitting: Family Medicine

## 2011-10-26 DIAGNOSIS — H6980 Other specified disorders of Eustachian tube, unspecified ear: Secondary | ICD-10-CM

## 2011-10-26 NOTE — ED Notes (Signed)
Patient complains of bilateral ear pain, left is worse than right, for 2 days. She describes the pain as severe sharp shooting pain that is 10/10 on the pain scale. The episodes last for a few seconds. The frequency of the episodes have increased in the last 24 hours. Carmen [name] has taken Sudafed with little relief.

## 2011-10-26 NOTE — Discharge Instructions (Signed)
Take plain Mucinex (guaifenesin) twice daily for cough and congestion.  Increase fluid intake May use Afrin nasal spray (or generic oxymetazoline) twice daily for about 5 days.  Also recommend using saline nasal spray several times daily and saline nasal irrigation (AYR is a common brand) Stop all antihistamines for now, and other non-prescription cough/cold preparations.

## 2011-10-26 NOTE — ED Provider Notes (Signed)
History     CSN: [id number]  Arrival date & time 10/26/11  1708   First MD Initiated Contact with Patient 10/26/11 1730      Chief Complaint  Patient presents with  . Otalgia    x 2 days      HPI Comments: Patient states that she had a cold about a week ago that resolved.  For the past two days her ears have felt clogged, worse on the left, and she feels as if her hearing is decreased.  Mild nasal congestion.  No recent fever.                             Patient is a 31 y.o. female presenting with ear pain. The history is provided by the patient.  Otalgia This is a new problem. The current episode started 2 days ago. There is pain in both ears. The problem occurs constantly. The problem has not changed since onset.There has been no fever. The pain is mild. Associated symptoms include rhinorrhea. Pertinent negatives include no ear discharge, no headaches, no hearing loss, no sore throat, no abdominal pain, no diarrhea, no vomiting, no neck pain, no cough and no rash.    Past Medical History  Diagnosis Date  . Syncope and collapse     neurocardiogenic  . Hypertension   . PSVT (paroxysmal supraventricular tachycardia)   . Kidney stones     history of  . Heart palpitations     long history of   . History of migraine headaches   . History of chest pain   . Sleeping difficulties   . History of cholecystitis     Laparoscopic cholecystectomy  . History of tilt table evaluation     IMPRESSION: Positive tilt table study with significant cardioinhibitory  response.  Marland Kitchen History of seizure disorder     remote history of seizure activity and was evaluated at [facility] for this in the past  She is presently not on any seizure  medications and has not had any seizures for many years.  . History of fatigue   . Stress   . Pericarditis 09/13/2010  . Costochondritis 09/13/2010    Past Surgical History  Procedure Date  . Cholecystectomy 11/2007    Cholecystitis - Laparoscopic  cholecystectomy with intraoperative  cholangiogram.  . Tilt table study 07/17/2005    Positive tilt table study with significant cardioinhibitory  response.    Family History  Problem Relation Age of Onset  . Hypertension Mother   . Heart disease Mother   . Cancer Mother     breast  . Heart attack Father   . Hyperlipidemia Father     History  Substance Use Topics  . Smoking status: Never Smoker   . Smokeless tobacco: Never Used  . Alcohol Use: No    OB History    Grav Para Term Preterm Abortions TAB SAB Ect Mult Living                  Review of Systems  HENT: Positive for ear pain and rhinorrhea. Negative for hearing loss, sore throat, neck pain and ear discharge.   Respiratory: Negative for cough.   Gastrointestinal: Negative for vomiting, abdominal pain and diarrhea.  Skin: Negative for rash.  Neurological: Negative for headaches.    Allergies  Quinolones  Home Medications   Current Outpatient Rx  Name Route Sig Dispense Refill  . ACETAMINOPHEN 325 MG PO  TABS Oral Take 650 mg by mouth every 6 (six) hours as needed.    Marland Kitchen OMEPRAZOLE 40 MG PO CPDR Oral Take 40 mg by mouth daily.      . ASPIRIN 81 MG PO TABS Oral Take 81 mg by mouth daily.      Marland Kitchen CALCIUM CARBONATE-VITAMIN D 500-200 MG-UNIT PO TABS Oral Take 1 tablet by mouth 2 (two) times daily.      Marland Kitchen DICLOFENAC SODIUM 75 MG PO TBEC Oral Take 75 mg by mouth 2 (two) times daily.      . OMEGA-3 FATTY ACIDS 1000 MG PO CAPS Oral Take 2 g by mouth 3 (three) times daily.      Marland Kitchen HYDROCHLOROTHIAZIDE 25 MG PO TABS Oral Take 1 tablet (25 mg total) by mouth daily. 30 tablet 11  . METOPROLOL SUCCINATE ER 50 MG PO TB24 Oral Take 50 mg by mouth daily.      . MULTIVITAMINS PO TABS Oral Take 1 tablet by mouth daily.      Marland Kitchen POTASSIUM CHLORIDE ER 10 MEQ PO TBCR Oral Take 1 tablet (10 mEq total) by mouth 2 (two) times daily. 60 tablet 11  . SUMATRIPTAN SUCCINATE 50 MG PO TABS Oral Take 50 mg by mouth every 2 (two) hours as needed.       Marland Kitchen VITAMIN E 400 UNITS PO CAPS Oral Take 400 Units by mouth daily.        BP 130/86  Pulse 74  Temp(Src) 98.5 F (36.9 C) (Oral)  Resp 16  Ht 5\' 3"  (1.6 m)  Wt 187 lb (84.823 kg)  BMI 33.13 kg/m2  SpO2 100%  Physical Exam Nursing notes and Vital Signs reviewed. Appearance:  Patient appears healthy, stated age, and in no acute distress Eyes:  Pupils are equal, round, and reactive to light and accomodation.  Extraocular movement is intact.  Conjunctivae are not inflamed  Ears:  Canals normal.  Tympanic membranes normal.  No TMJ tenderness Nose:  Mildly congested turbinates.  No sinus tenderness.   Pharynx:  Normal Neck:  Supple.  Slightly tender shotty left anterior node is palpated   Lungs:  Clear to auscultation.  Breath sounds are equal.  Heart:  Regular rate and rhythm without murmurs, rubs, or gallops.  Skin:  No rash present.   ED Course  Procedures  none  Labs Reviewed -  Tympanogram:  Normal bilaterally    1. Eustachian tube dysfunction       MDM  There is no evidence of bacterial infection today.   Take plain Mucinex (guaifenesin) twice daily for cough and congestion.  Increase fluid intake May use Afrin nasal spray (or generic oxymetazoline) twice daily for about 5 days.  Also recommend using saline nasal spray several times daily and saline nasal irrigation (AYR is a common brand) Stop all antihistamines for now, and other non-prescription cough/cold preparations. Followup with ENT if not improving.        Lattie Haw, MD 10/26/11 1850

## 2011-10-28 ENCOUNTER — Telehealth: Payer: Self-pay | Admitting: Family Medicine

## 2012-02-26 ENCOUNTER — Other Ambulatory Visit: Payer: Self-pay | Admitting: Family Medicine

## 2012-02-26 DIAGNOSIS — Z1231 Encounter for screening mammogram for malignant neoplasm of breast: Secondary | ICD-10-CM

## 2012-02-26 DIAGNOSIS — Z803 Family history of malignant neoplasm of breast: Secondary | ICD-10-CM

## 2012-02-27 ENCOUNTER — Ambulatory Visit
Admission: RE | Admit: 2012-02-27 | Discharge: 2012-02-27 | Disposition: A | Discharge status: Home / Self Care | Payer: PRIVATE HEALTH INSURANCE | Source: Ambulatory Visit | Attending: Family Medicine | Admitting: Family Medicine

## 2012-02-27 DIAGNOSIS — Z1231 Encounter for screening mammogram for malignant neoplasm of breast: Secondary | ICD-10-CM

## 2012-03-05 ENCOUNTER — Ambulatory Visit
Admission: RE | Admit: 2012-03-05 | Discharge: 2012-03-05 | Disposition: A | Discharge status: Home / Self Care | Payer: PRIVATE HEALTH INSURANCE | Source: Ambulatory Visit | Attending: Family Medicine | Admitting: Family Medicine

## 2012-03-05 DIAGNOSIS — Z803 Family history of malignant neoplasm of breast: Secondary | ICD-10-CM

## 2012-03-05 MED ORDER — GADOBENATE DIMEGLUMINE 529 MG/ML IV SOLN: 17.0000 mL | Freq: Once | INTRAVENOUS | Status: AC | PRN

## 2012-08-21 ENCOUNTER — Ambulatory Visit (INDEPENDENT_AMBULATORY_CARE_PROVIDER_SITE_OTHER): Payer: PRIVATE HEALTH INSURANCE | Admitting: General Surgery

## 2012-09-02 ENCOUNTER — Ambulatory Visit (INDEPENDENT_AMBULATORY_CARE_PROVIDER_SITE_OTHER): Payer: PRIVATE HEALTH INSURANCE | Admitting: General Surgery

## 2012-09-02 ENCOUNTER — Encounter (INDEPENDENT_AMBULATORY_CARE_PROVIDER_SITE_OTHER): Payer: Self-pay | Admitting: General Surgery

## 2012-09-02 VITALS — BP 130/70 | HR 70 | Temp 98.0°F | Resp 18 | Ht 63.0 in | Wt 174.0 lb

## 2012-09-02 DIAGNOSIS — K602 Anal fissure, unspecified: Secondary | ICD-10-CM

## 2012-09-02 MED ORDER — NITROGLYCERIN 0.4 % RE OINT: 1.0000 "application " | TOPICAL_OINTMENT | Freq: Two times a day (BID) | RECTAL | Status: DC

## 2012-09-02 NOTE — Patient Instructions (Signed)

## 2012-09-02 NOTE — Progress Notes (Addendum)
Chief Complaint  Patient presents with  . New Evaluation    eval anal fissure    HISTORY: Carmen [name] is a 32 y.o. female who presents to the office with rectal pain.  This had been occurring for since early Dec.  She has tried diltiazem cream for over 8 wks straight in the past with no success.  It is intermittent in nature.  Her bowel habits are regular and her bowel movements are soft.  Her fiber intake is moderate.  She denies any bleeding.  She has underwent a couple anal dilations with Dr Elnoria Howard with no permanent success.  She has a history of episiotomies and vaginal tearing with her childbirths.  She currently denies any pain with BM's but does having aching pain after BM's.    Past Medical History  Diagnosis Date  . Syncope and collapse     neurocardiogenic  . Hypertension   . PSVT (paroxysmal supraventricular tachycardia)   . Kidney stones     history of  . Heart palpitations     long history of   . History of migraine headaches   . History of chest pain   . Sleeping difficulties   . History of cholecystitis     Laparoscopic cholecystectomy  . History of tilt table evaluation     IMPRESSION: Positive tilt table study with significant cardioinhibitory  response.  Marland Kitchen History of seizure disorder     remote history of seizure activity and was evaluated at [facility] for this in the past  She is presently not on any seizure  medications and has not had any seizures for many years.  . History of fatigue   . Stress   . Pericarditis 09/13/2010  . Costochondritis 09/13/2010  . GERD (gastroesophageal reflux disease)   . Anxiety   . Hyperlipidemia       Past Surgical History  Procedure Laterality Date  . Cholecystectomy  11/2007    Cholecystitis - Laparoscopic cholecystectomy with intraoperative  cholangiogram.  . Tilt table study  07/17/2005    Positive tilt table study with significant cardioinhibitory  response.        Current Outpatient Prescriptions  Medication Sig  Dispense Refill  . ALPRAZolam (XANAX) 0.25 MG tablet Take 0.25 mg by mouth at bedtime as needed for sleep. 1/2 tablet PRN.      Marland Kitchen aspirin 81 MG tablet Take 81 mg by mouth daily.        Marland Kitchen docusate sodium (COLACE) 100 MG capsule Take 100 mg by mouth at bedtime.      . fish oil-omega-3 fatty acids 1000 MG capsule Take 2 g by mouth 2 (two) times daily.       . metoprolol (TOPROL-XL) 50 MG 24 hr tablet Take 50 mg by mouth 2 (two) times daily.       . norgestrel-ethinyl estradiol (LO/OVRAL,CRYSELLE) 0.3-30 MG-MCG tablet Take 1 tablet by mouth daily.      Marland Kitchen omeprazole (PRILOSEC) 40 MG capsule Take 40 mg by mouth daily.        . SUMAtriptan (IMITREX) 50 MG tablet Take 50 mg by mouth every 2 (two) hours as needed.        . topiramate (TOPAMAX) 100 MG tablet Take 100 mg by mouth at bedtime.      . vitamin E 400 UNIT capsule Take 400 Units by mouth daily.         No current facility-administered medications for this visit.      Allergies  Allergen Reactions  . Quinolones Nausea Only      Family History  Problem Relation Age of Onset  . Hypertension Mother   . Heart disease Mother   . Cancer Mother     breast  . Heart attack Father   . Hyperlipidemia Father   . Hepatitis C Father   . Cancer Maternal Aunt     Breast    History   Social History  . Marital Status: Married    Spouse Name: N/A    Number of Children: N/A  . Years of Education: N/A   Social History Main Topics  . Smoking status: Never Smoker   . Smokeless tobacco: Never Used  . Alcohol Use: No  . Drug Use: No  . Sexually Active: Yes    Birth Control/ Protection: IUD   Other Topics Concern  . None   Social History Narrative  . None      REVIEW OF SYSTEMS - PERTINENT POSITIVES ONLY: Review of Systems - General ROS: negative for - chills, fever or weight loss Hematological and Lymphatic ROS: negative for - bleeding problems, blood clots or bruising Respiratory ROS: no cough, shortness of breath, or  wheezing Cardiovascular ROS: no chest pain or dyspnea on exertion Gastrointestinal ROS: no abdominal pain, change in bowel habits, or black or bloody stools Genito-Urinary ROS: no dysuria, trouble voiding, or hematuria  EXAM: Filed Vitals:   09/02/12 1023  BP: 130/70  Pulse: 70  Temp: 98 F (36.7 C)  Resp: 18    General appearance: alert and cooperative Resp: clear to auscultation bilaterally Cardio: regular rate and rhythm GI: soft, non-tender; bowel sounds normal; no masses,  no organomegaly   Procedure: Anoscopy Surgeon: Maisie Fus Diagnosis: anal pain  Assistant: Morris After the risks and benefits were explained, verbal consent was obtained for above procedure  Anesthesia: none Findings: moderate sized posterior anal fissure with inflammatory skin tag    ASSESSMENT AND PLAN: Carmen [name] is a 32 y.o. F with a 4 month history of anal fissure.  She has failed medical management.  Given her childbirth history, I do not think she would be a good candidate for a sphincterotomy.  I told her that I did not feel that nitroglycerine cream will change the situation enough to heal the area completely, given the size of the fissure.  I have recommended that she undergo a chemical sphincterotomy with botox.  We discussed the risk of temporary incontinence of this procedure.  All management options were discussed in detail.  She has agreed to proceed with the botox injections.   Addendum: After evaluating the cost of surgery, the patient has elected to try Rectiv first.  We will perform surgery if this does not work.  I will see her back in 4 weeks.    Vanita Panda, MD Colon and Rectal Surgery / General Surgery [facility]      Visit Diagnoses: 1. Anal fissure     Primary Care Physician: Alichia Alridge Not In System

## 2012-09-02 NOTE — Addendum Note (Signed)
Addended by: Vanita Panda on: 09/02/2012 12:13 PM   Modules accepted: Orders

## 2012-09-07 ENCOUNTER — Encounter (INDEPENDENT_AMBULATORY_CARE_PROVIDER_SITE_OTHER): Payer: Self-pay | Admitting: General Surgery

## 2013-04-30 ENCOUNTER — Other Ambulatory Visit: Payer: Self-pay

## 2014-02-10 ENCOUNTER — Encounter (HOSPITAL_COMMUNITY): Payer: Self-pay | Admitting: *Deleted

## 2014-02-10 ENCOUNTER — Inpatient Hospital Stay (HOSPITAL_COMMUNITY)
Admission: AD | Admit: 2014-02-10 | Discharge: 2014-02-10 | Disposition: A | Discharge status: Home / Self Care | Payer: Managed Care, Other (non HMO) | Source: Ambulatory Visit | Attending: Family Medicine | Admitting: Family Medicine

## 2014-02-10 ENCOUNTER — Inpatient Hospital Stay (HOSPITAL_COMMUNITY): Payer: Managed Care, Other (non HMO)

## 2014-02-10 ENCOUNTER — Inpatient Hospital Stay (EMERGENCY_DEPARTMENT_HOSPITAL)
Admission: AD | Admit: 2014-02-10 | Discharge: 2014-02-10 | Disposition: A | Discharge status: Home / Self Care | Payer: Managed Care, Other (non HMO) | Source: Ambulatory Visit | Attending: Family Medicine | Admitting: Family Medicine

## 2014-02-10 DIAGNOSIS — R109 Unspecified abdominal pain: Secondary | ICD-10-CM

## 2014-02-10 DIAGNOSIS — O210 Mild hyperemesis gravidarum: Secondary | ICD-10-CM | POA: Diagnosis not present

## 2014-02-10 DIAGNOSIS — O26899 Other specified pregnancy related conditions, unspecified trimester: Secondary | ICD-10-CM

## 2014-02-10 DIAGNOSIS — O9989 Other specified diseases and conditions complicating pregnancy, childbirth and the puerperium: Secondary | ICD-10-CM | POA: Diagnosis not present

## 2014-02-10 DIAGNOSIS — R1032 Left lower quadrant pain: Secondary | ICD-10-CM

## 2014-02-10 LAB — URINALYSIS, ROUTINE W REFLEX MICROSCOPIC
Bilirubin Urine: NEGATIVE
Glucose, UA: NEGATIVE mg/dL
Hgb urine dipstick: NEGATIVE
Ketones, ur: 15 mg/dL — AB
Nitrite: NEGATIVE
Protein, ur: NEGATIVE mg/dL
Specific Gravity, Urine: 1.01 (ref 1.005–1.030)
Urobilinogen, UA: 0.2 mg/dL (ref 0.0–1.0)
pH: 6 (ref 5.0–8.0)

## 2014-02-10 LAB — WET PREP, GENITAL
Clue Cells Wet Prep HPF POC: NONE SEEN
Trich, Wet Prep: NONE SEEN
Yeast Wet Prep HPF POC: NONE SEEN

## 2014-02-10 LAB — CBC
HCT: 41.4 % (ref 36.0–46.0)
HCT: 39 % (ref 36.0–46.0)
Hemoglobin: 14.4 g/dL (ref 12.0–15.0)
Hemoglobin: 13.7 g/dL (ref 12.0–15.0)
MCH: 30.3 pg (ref 26.0–34.0)
MCH: 30.6 pg (ref 26.0–34.0)
MCHC: 34.8 g/dL (ref 30.0–36.0)
MCHC: 35.1 g/dL (ref 30.0–36.0)
MCV: 87.2 fL (ref 78.0–100.0)
MCV: 87.1 fL (ref 78.0–100.0)
Platelets: 201 10*3/uL (ref 150–400)
Platelets: 203 10*3/uL (ref 150–400)
RBC: 4.75 MIL/uL (ref 3.87–5.11)
RBC: 4.48 MIL/uL (ref 3.87–5.11)
RDW: 13.1 % (ref 11.5–15.5)
RDW: 13.1 % (ref 11.5–15.5)
WBC: 6.3 10*3/uL (ref 4.0–10.5)
WBC: 8.7 10*3/uL (ref 4.0–10.5)

## 2014-02-10 LAB — URINE MICROSCOPIC-ADD ON

## 2014-02-10 LAB — ABO/RH: ABO/RH(D): AB POS

## 2014-02-10 LAB — POCT PREGNANCY, URINE: Preg Test, Ur: POSITIVE — AB

## 2014-02-10 LAB — HCG, QUANTITATIVE, PREGNANCY: hCG, Beta Chain, Quant, S: 329 m[IU]/mL — ABNORMAL HIGH (ref <5)

## 2014-02-10 MED ORDER — PROMETHAZINE HCL 25 MG PO TABS: 12.5000 mg | ORAL_TABLET | Freq: Four times a day (QID) | ORAL | Status: DC | PRN

## 2014-02-10 MED ORDER — HYDROCODONE-ACETAMINOPHEN 5-325 MG PO TABS
2.0000 | ORAL_TABLET | Freq: Once | ORAL | Status: AC
  Administered 2014-02-10: 2 via ORAL
  Filled 2014-02-10: qty 2

## 2014-02-10 MED ORDER — HYOSCYAMINE SULFATE 0.125 MG PO TABS
0.2500 mg | ORAL_TABLET | Freq: Once | ORAL | Status: AC
  Administered 2014-02-10: 0.25 mg via ORAL
  Filled 2014-02-10: qty 2

## 2014-02-10 MED ORDER — HYDROMORPHONE HCL PF 1 MG/ML IJ SOLN: 1.0000 mg | Freq: Once | INTRAMUSCULAR | Status: DC

## 2014-02-10 MED ORDER — OXYCODONE-ACETAMINOPHEN 5-325 MG PO TABS: 1.0000 | ORAL_TABLET | Freq: Four times a day (QID) | ORAL | Status: DC | PRN

## 2014-02-10 NOTE — MAU Note (Signed)
Pt was scheduled Friday for Novasure.  Has now canceled due to positive UPT at home.  Pt states had been using condom except for one time on 01-22-14.  Pt took the morning after pill on 01-23-14.

## 2014-02-10 NOTE — Discharge Instructions (Signed)

## 2014-02-10 NOTE — MAU Note (Signed)
Pt presents to MAU with complaints of an increase in abdominal pain. Pt was evaluated this morning in MAU for the same issue and had an Ultrasound and blood work. Reports she was suppose to follow up this friday

## 2014-02-10 NOTE — MAU Provider Note (Signed)
History     CSN: [id number]  Arrival date and time: 02/10/14 1659   First Provider Initiated Contact with Patient 02/10/14 1856      Chief Complaint  Patient presents with  . Abdominal Pain   HPI  Ms. Carmen [name] is a 33 y.o. female G2P1001 at [redacted]w[redacted]d who presents with worsening abdominal pain. She was here in MAU earlier today with left sided abdominal pain; positive Quant and US showed possible IUP, no yolk sac. The patient was instructed to return in 48 hours for a repeat beta hcg level and given ectopic precautions. The patient returns this evening with worsening pain; denies vaginal bleeding.   OB History   Grav Para Term Preterm Abortions TAB SAB Ect Mult Living   2 1 1       1       Past Medical History  Diagnosis Date  . Syncope and collapse     neurocardiogenic  . Hypertension   . PSVT (paroxysmal supraventricular tachycardia)   . Kidney stones     history of  . Heart palpitations     long history of   . History of migraine headaches   . History of chest pain   . Sleeping difficulties   . History of cholecystitis     Laparoscopic cholecystectomy  . History of tilt table evaluation     IMPRESSION: Positive tilt table study with significant cardioinhibitory  response.  Marland Kitchen History of seizure disorder     remote history of seizure activity and was evaluated at [facility] for this in the past  She is presently not on any seizure  medications and has not had any seizures for many years.  . History of fatigue   . Stress   . Pericarditis 09/13/2010  . Costochondritis 09/13/2010  . GERD (gastroesophageal reflux disease)   . Anxiety   . Hyperlipidemia     Past Surgical History  Procedure Laterality Date  . Cholecystectomy  11/2007    Cholecystitis - Laparoscopic cholecystectomy with intraoperative  cholangiogram.  . Tilt table study  07/17/2005    Positive tilt table study with significant cardioinhibitory  response.  . Cholecystectomy      Family History   Problem Relation Age of Onset  . Hypertension Mother   . Heart disease Mother   . Cancer Mother     breast  . Heart attack Father   . Hyperlipidemia Father   . Hepatitis C Father   . Cancer Maternal Aunt     Breast    History  Substance Use Topics  . Smoking status: Never Smoker   . Smokeless tobacco: Never Used  . Alcohol Use: No    Allergies:  Allergies  Allergen Reactions  . Dilaudid [Hydromorphone Hcl]     "I just about crawled out of my skin"  . Quinolones Nausea Only    Prescriptions prior to admission  Medication Sig Dispense Refill  . omeprazole (PRILOSEC) 40 MG capsule Take 40 mg by mouth daily.        . promethazine (PHENERGAN) 25 MG tablet Take 0.5 tablets (12.5 mg total) by mouth every 6 (six) hours as needed for nausea or vomiting.  30 tablet  0   Results for orders placed during the hospital encounter of 02/10/14 (from the past 48 hour(s))  CBC     Status: None   Collection Time    02/10/14  7:58 PM      Result Value Ref Range   WBC 8.7  4.0 - 10.5 K/uL   RBC 4.48  3.87 - 5.11 MIL/uL   Hemoglobin 13.7  12.0 - 15.0 g/dL   HCT 16.139.0  09.636.0 - 04.546.0 %   MCV 87.1  78.0 - 100.0 fL   MCH 30.6  26.0 - 34.0 pg   MCHC 35.1  30.0 - 36.0 g/dL   RDW 40.913.1  81.111.5 - 91.415.5 %   Platelets 203  150 - 400 K/uL    Review of Systems  Gastrointestinal: Positive for nausea, vomiting, abdominal pain (Left mid- lower abdominal pain; sharp and shooting ) and diarrhea (3 loose stools in the past 24 hours ). Negative for constipation.   Physical Exam   Blood pressure 126/80, pulse 93, resp. rate 16, last menstrual period 12/30/2013.  Physical Exam  Constitutional: She appears well-developed and well-nourished. No distress.  Cardiovascular: Normal rate.   Respiratory: Effort normal. No respiratory distress.  GI: Soft. Normal appearance. There is tenderness. There is guarding. There is no rebound.    Neurological: She is alert.  Skin: Skin is warm. She is diaphoretic.   Psychiatric: Her mood appears anxious.    MAU Course  Procedures None  MDM Vicodin 2 tabs Levsin PO CBC Discussed patient with Dr. Emelda FearFerguson; I will send the patient for another US to assess for free fluid.  Pt requests additional pain medication; unable to given dilaudid due to allergy. I offered morphine to the patient and she decided to hold off on additional pain medication at this time.  Report given to J.Ethier PAC who resumes care of the patient.    Iona HansenJennifer Irene Rasch, NP  02/10/2014, 8:30 PM   2000 - Patient in US. Care assumed from Venia CarbonJennifer Rasch, NP  [place] Ob Comp Less 14 Wks  02/10/2014   CLINICAL DATA:  Abdominal pain, early pregnancy.  EXAM: OBSTETRIC <14 WK ULTRASOUND  TECHNIQUE: Transabdominal ultrasound was performed for evaluation of the gestation as well as the maternal uterus and adnexal regions.  COMPARISON:  None.  FINDINGS: Intrauterine gestational sac: Not visualized  Yolk sac:  Not visualized  Embryo:  Not visualized  Maternal uterus/adnexae: Right ovary unremarkable. Left ovary not well visualized due to overlying bowel gas. No adnexal masses. No free fluid. Heterogeneous appearance of the endometrium which is approximately 11 mm in thickness.  IMPRESSION: No intrauterine pregnancy visualized. Differential considerations would include early intrauterine pregnancy too early to visualize, spontaneous abortion, or occult ectopic pregnancy. Recommend close clinical followup and serial quantitative beta HCGs and ultrasounds.   Electronically Signed   By: Charlett NoseKevin  Dover M.D.   On: 02/10/2014 12:19   [place] Ob Transvaginal  02/10/2014   CLINICAL DATA:  Positive pregnancy test with worsening left lower quadrant pain.  EXAM: TRANSVAGINAL OB ULTRASOUND  TECHNIQUE: Transvaginal ultrasound was performed for complete evaluation of the gestation as well as the maternal uterus, adnexal regions, and pelvic cul-de-sac.  COMPARISON:  Ultrasound exam from earlier today.  FINDINGS:  Intrauterine gestational sac: No intrauterine gestational sac visualized.  Yolk sac:  Not visualized.  Embryo:  Not visualized.  Cardiac Activity: N/A  Maternal uterus/adnexae: No evidence for adnexal mass. The maternal ovaries are sonographically normal bilaterally. There is a trace amount of simple appearing free fluid in the cul-de-sac.  IMPRESSION: No interval change in exam. No intrauterine gestation identified. Given the history of a positive pregnancy test, differential considerations include intrauterine gestation too early to visualize, completed abortion, or nonvisualized ectopic pregnancy. Close clinical correlation is recommended with serial beta-hCG and followup ultrasound  as warranted.   Electronically Signed   By: Kennith Center M.D.   On: 02/10/2014 20:53   US Ob Transvaginal  02/10/2014   CLINICAL DATA:  Abdominal pain, early pregnancy.  EXAM: OBSTETRIC <14 WK ULTRASOUND  TECHNIQUE: Transabdominal ultrasound was performed for evaluation of the gestation as well as the maternal uterus and adnexal regions.  COMPARISON:  None.  FINDINGS: Intrauterine gestational sac: Not visualized  Yolk sac:  Not visualized  Embryo:  Not visualized  Maternal uterus/adnexae: Right ovary unremarkable. Left ovary not well visualized due to overlying bowel gas. No adnexal masses. No free fluid. Heterogeneous appearance of the endometrium which is approximately 11 mm in thickness.  IMPRESSION: No intrauterine pregnancy visualized. Differential considerations would include early intrauterine pregnancy too early to visualize, spontaneous abortion, or occult ectopic pregnancy. Recommend close clinical followup and serial quantitative beta HCGs and ultrasounds.   Electronically Signed   By: Charlett Nose M.D.   On: 02/10/2014 12:19   MDM Patient reports some improvement with medications Discussed with Dr. Emelda Fear. Ok for discharge and follow-up in 48 hours  Assessment and Plan  A: LLQ abdominal pain Pregnancy of  unknown location Diarrhea  P: Discharge home Rx for percocet given to patient Ectopic precautions discussed Discussed Imodium PRN diarrhea BRAT diet information given Patient advised to return to MAU in 48 hours for follow-up labs or sooner if symptoms worsen  Freddi Starr, PA-C  02/10/2014 9:02 PM

## 2014-02-10 NOTE — MAU Provider Note (Signed)
Attestation of Attending Supervision of Advanced Practitioner (PA/CNM/NP): Evaluation and management procedures were performed by the Advanced Practitioner under my supervision and collaboration.  I have reviewed the Advanced Practitioner's note and chart, and I agree with the management and plan.  Reva BoresPRATT,Aailyah Dunbar S, MD [facility] Attending 02/10/2014 1:09 PM

## 2014-02-10 NOTE — MAU Note (Signed)
Pt presents to MAU with complaints of lower abdominal cramping with nausea for a couple of weeks. Positive pregnancy test 2 weeks ago

## 2014-02-10 NOTE — Discharge Instructions (Signed)

## 2014-02-10 NOTE — MAU Provider Note (Signed)
History     CSN: [id number]  Arrival date and time: 02/10/14 1025   First Provider Initiated Contact with Patient 02/10/14 1109      Chief Complaint  Patient presents with  . Morning Sickness  . Possible Pregnancy   HPI  Ms. Carmen [name] is a 33 y.o. female G2P1001 at [redacted]w[redacted]d who presents with N/V, possible pregnancy and left sided abdominal pain. She was scheduled to have Norasure done on Friday; this was to be done at [facility]. The patient had unprotected sex and took the morning after pill on August 1. She had a positive pregnancy test last week. She had one episode of vaginal spotting on August 12 and denies spotting or vaginal bleeding at this time.    OB History   Grav Para Term Preterm Abortions TAB SAB Ect Mult Living   2 1 1       1       Past Medical History  Diagnosis Date  . Syncope and collapse     neurocardiogenic  . Hypertension   . PSVT (paroxysmal supraventricular tachycardia)   . Kidney stones     history of  . Heart palpitations     long history of   . History of migraine headaches   . History of chest pain   . Sleeping difficulties   . History of cholecystitis     Laparoscopic cholecystectomy  . History of tilt table evaluation     IMPRESSION: Positive tilt table study with significant cardioinhibitory  response.  Marland Kitchen History of seizure disorder     remote history of seizure activity and was evaluated at [facility] for this in the past  She is presently not on any seizure  medications and has not had any seizures for many years.  . History of fatigue   . Stress   . Pericarditis 09/13/2010  . Costochondritis 09/13/2010  . GERD (gastroesophageal reflux disease)   . Anxiety   . Hyperlipidemia     Past Surgical History  Procedure Laterality Date  . Cholecystectomy  11/2007    Cholecystitis - Laparoscopic cholecystectomy with intraoperative  cholangiogram.  . Tilt table study  07/17/2005    Positive tilt table study with significant cardioinhibitory   response.  . Cholecystectomy      Family History  Problem Relation Age of Onset  . Hypertension Mother   . Heart disease Mother   . Cancer Mother     breast  . Heart attack Father   . Hyperlipidemia Father   . Hepatitis C Father   . Cancer Maternal Aunt     Breast    History  Substance Use Topics  . Smoking status: Never Smoker   . Smokeless tobacco: Never Used  . Alcohol Use: No    Allergies:  Allergies  Allergen Reactions  . Quinolones Nausea Only    Prescriptions prior to admission  Medication Sig Dispense Refill  . ALPRAZolam (XANAX) 0.25 MG tablet Take 0.25 mg by mouth at bedtime as needed for sleep. 1/2 tablet PRN.      Marland Kitchen aspirin 81 MG tablet Take 81 mg by mouth daily.        Marland Kitchen docusate sodium (COLACE) 100 MG capsule Take 100 mg by mouth at bedtime.      . fish oil-omega-3 fatty acids 1000 MG capsule Take 2 g by mouth 2 (two) times daily.       . metoprolol (TOPROL-XL) 50 MG 24 hr tablet Take 50 mg by mouth 2 (  two) times daily.       . Nitroglycerin 0.4 % OINT Place 1 application rectally 2 (two) times daily.  1 Tube  0  . norgestrel-ethinyl estradiol (LO/OVRAL,CRYSELLE) 0.3-30 MG-MCG tablet Take 1 tablet by mouth daily.      Marland Kitchen omeprazole (PRILOSEC) 40 MG capsule Take 40 mg by mouth daily.        . SUMAtriptan (IMITREX) 50 MG tablet Take 50 mg by mouth every 2 (two) hours as needed.        . topiramate (TOPAMAX) 100 MG tablet Take 100 mg by mouth at bedtime.      . vitamin E 400 UNIT capsule Take 400 Units by mouth daily.         Results for orders placed during the hospital encounter of 02/10/14 (from the past 48 hour(s))  URINALYSIS, ROUTINE W REFLEX MICROSCOPIC     Status: Abnormal   Collection Time    02/10/14 10:52 AM      Result Value Ref Range   Color, Urine YELLOW  YELLOW   APPearance CLOUDY (*) CLEAR   Specific Gravity, Urine 1.010  1.005 - 1.030   pH 6.0  5.0 - 8.0   Glucose, UA NEGATIVE  NEGATIVE mg/dL   Hgb urine dipstick NEGATIVE  NEGATIVE    Bilirubin Urine NEGATIVE  NEGATIVE   Ketones, ur 15 (*) NEGATIVE mg/dL   Protein, ur NEGATIVE  NEGATIVE mg/dL   Urobilinogen, UA 0.2  0.0 - 1.0 mg/dL   Nitrite NEGATIVE  NEGATIVE   Leukocytes, UA MODERATE (*) NEGATIVE  URINE MICROSCOPIC-ADD ON     Status: Abnormal   Collection Time    02/10/14 10:52 AM      Result Value Ref Range   Squamous Epithelial / LPF MANY (*) RARE   WBC, UA 3-6  <3 WBC/hpf   Bacteria, UA FEW (*) RARE   Urine-Other MUCOUS PRESENT    POCT PREGNANCY, URINE     Status: Abnormal   Collection Time    02/10/14 10:55 AM      Result Value Ref Range   Preg Test, Ur POSITIVE (*) NEGATIVE   Comment:            THE SENSITIVITY OF THIS     METHODOLOGY IS >24 mIU/mL  WET PREP, GENITAL     Status: Abnormal   Collection Time    02/10/14 11:15 AM      Result Value Ref Range   Yeast Wet Prep HPF POC NONE SEEN  NONE SEEN   Trich, Wet Prep NONE SEEN  NONE SEEN   Clue Cells Wet Prep HPF POC NONE SEEN  NONE SEEN   WBC, Wet Prep HPF POC FEW (*) NONE SEEN   Comment: FEW BACTERIA SEEN  CBC     Status: None   Collection Time    02/10/14 11:25 AM      Result Value Ref Range   WBC 6.3  4.0 - 10.5 K/uL   RBC 4.75  3.87 - 5.11 MIL/uL   Hemoglobin 14.4  12.0 - 15.0 g/dL   HCT 16.1  09.6 - 04.5 %   MCV 87.2  78.0 - 100.0 fL   MCH 30.3  26.0 - 34.0 pg   MCHC 34.8  30.0 - 36.0 g/dL   RDW 40.9  81.1 - 91.4 %   Platelets 201  150 - 400 K/uL  HCG, QUANTITATIVE, PREGNANCY     Status: Abnormal   Collection Time    02/10/14 11:25 AM  Result Value Ref Range   hCG, Beta Chain, Quant, S 329 (*) <5 mIU/mL   Comment:              GEST. AGE      CONC.  (mIU/mL)       <=1 WEEK        5 - 50         2 WEEKS       50 - 500         3 WEEKS       100 - 10,000         4 WEEKS     1,000 - 30,000         5 WEEKS     3,500 - 115,000       6-8 WEEKS     12,000 - 270,000        12 WEEKS     15,000 - 220,000                FEMALE AND NON-PREGNANT FEMALE:         LESS THAN 5 mIU/mL    US  Ob Comp Less 14 Wks  02/10/2014   CLINICAL DATA:  Abdominal pain, early pregnancy.  EXAM: OBSTETRIC <14 WK ULTRASOUND  TECHNIQUE: Transabdominal ultrasound was performed for evaluation of the gestation as well as the maternal uterus and adnexal regions.  COMPARISON:  None.  FINDINGS: Intrauterine gestational sac: Not visualized  Yolk sac:  Not visualized  Embryo:  Not visualized  Maternal uterus/adnexae: Right ovary unremarkable. Left ovary not well visualized due to overlying bowel gas. No adnexal masses. No free fluid. Heterogeneous appearance of the endometrium which is approximately 11 mm in thickness.  IMPRESSION: No intrauterine pregnancy visualized. Differential considerations would include early intrauterine pregnancy too early to visualize, spontaneous abortion, or occult ectopic pregnancy. Recommend close clinical followup and serial quantitative beta HCGs and ultrasounds.   Electronically Signed   By: Charlett Nose M.D.   On: 02/10/2014 12:19   US Ob Transvaginal  02/10/2014   CLINICAL DATA:  Abdominal pain, early pregnancy.  EXAM: OBSTETRIC <14 WK ULTRASOUND  TECHNIQUE: Transabdominal ultrasound was performed for evaluation of the gestation as well as the maternal uterus and adnexal regions.  COMPARISON:  None.  FINDINGS: Intrauterine gestational sac: Not visualized  Yolk sac:  Not visualized  Embryo:  Not visualized  Maternal uterus/adnexae: Right ovary unremarkable. Left ovary not well visualized due to overlying bowel gas. No adnexal masses. No free fluid. Heterogeneous appearance of the endometrium which is approximately 11 mm in thickness.  IMPRESSION: No intrauterine pregnancy visualized. Differential considerations would include early intrauterine pregnancy too early to visualize, spontaneous abortion, or occult ectopic pregnancy. Recommend close clinical followup and serial quantitative beta HCGs and ultrasounds.   Electronically Signed   By: Charlett Nose M.D.   On: 02/10/2014 12:19     Review of Systems  Constitutional: Negative for fever and chills.  Gastrointestinal: Positive for nausea, vomiting, abdominal pain (Left sided abdominal cramping ) and diarrhea.  Genitourinary: Negative for dysuria, urgency, frequency and hematuria.       No vaginal discharge. No vaginal bleeding. No dysuria.   Musculoskeletal: Negative for back pain.   Physical Exam   Blood pressure 141/97, pulse 93, temperature 98.2 F (36.8 C), temperature source Oral, resp. rate 16, height 5' 2.5" (1.588 m), weight 82.555 kg (182 lb), last menstrual period 12/30/2013.  Physical Exam  Constitutional: She is oriented to person, place,  and time. She appears well-developed and well-nourished. No distress.  HENT:  Head: Normocephalic.  Eyes: Pupils are equal, round, and reactive to light.  Respiratory: Effort normal.  GI: Soft. Normal appearance. She exhibits no distension. There is tenderness. There is no rebound and no guarding.    Genitourinary: No vaginal discharge found.  Speculum exam: Vagina - Small amount of creamy discharge, no odor Cervix - No contact bleeding Bimanual exam: Cervix closed Uterus non tender, normal size Adnexa non tender, no masses bilaterally GC/Chlam, wet prep done Chaperone present for exam.   Musculoskeletal: Normal range of motion.  Neurological: She is alert and oriented to person, place, and time.  Skin: Skin is warm. She is not diaphoretic.  Psychiatric: Her behavior is normal.    MAU Course  Procedures None  MDM CBC ABO Hcg UA Urine preg US  Assessment and Plan   A:  1. Abdominal pain in pregnancy   2.   Nausea and vomiting in pregnancy  P: Discharge home in stable condition  Ectopic precautions discussed at length; return to MAU with any increased pain  Return in 48 hours for beta hcg  RX: phenergan  Increase PO intake   Iona HansenJennifer Irene Janella Rogala, NP  02/10/2014, 12:54 PM

## 2014-02-11 LAB — GC/CHLAMYDIA PROBE AMP
CT Probe RNA: NEGATIVE
GC Probe RNA: NEGATIVE

## 2014-02-12 ENCOUNTER — Encounter (HOSPITAL_COMMUNITY): Payer: Self-pay | Admitting: Advanced Practice Midwife

## 2014-02-12 ENCOUNTER — Inpatient Hospital Stay (HOSPITAL_COMMUNITY)
Admission: AD | Admit: 2014-02-12 | Discharge: 2014-02-12 | Disposition: A | Discharge status: Home / Self Care | Payer: Managed Care, Other (non HMO) | Source: Ambulatory Visit | Attending: Obstetrics & Gynecology | Admitting: Obstetrics & Gynecology

## 2014-02-12 DIAGNOSIS — O209 Hemorrhage in early pregnancy, unspecified: Secondary | ICD-10-CM | POA: Diagnosis present

## 2014-02-12 DIAGNOSIS — O99891 Other specified diseases and conditions complicating pregnancy: Secondary | ICD-10-CM | POA: Diagnosis not present

## 2014-02-12 DIAGNOSIS — O9989 Other specified diseases and conditions complicating pregnancy, childbirth and the puerperium: Secondary | ICD-10-CM

## 2014-02-12 DIAGNOSIS — O26899 Other specified pregnancy related conditions, unspecified trimester: Secondary | ICD-10-CM

## 2014-02-12 DIAGNOSIS — R109 Unspecified abdominal pain: Secondary | ICD-10-CM | POA: Diagnosis not present

## 2014-02-12 LAB — HCG, QUANTITATIVE, PREGNANCY: hCG, Beta Chain, Quant, S: 667 m[IU]/mL — ABNORMAL HIGH (ref <5)

## 2014-02-12 NOTE — MAU Provider Note (Signed)
Attestation of Attending Supervision of Advanced Practitioner: Evaluation and management procedures were performed by the PA/NP/CNM/OB Fellow under my supervision/collaboration. Chart reviewed and agree with management and plan.We discussed pt management during eval process  Brolin Dambrosia V 02/12/2014 7:53 AM

## 2014-02-12 NOTE — MAU Provider Note (Signed)
Carmen [name] is a 33 y.o. G2P1001 at 2757w2d here for follow up quant HCG. Vaginal bleeding: none. Abdominal pain: shooting and intermittent.   Prior HCGs: 8/19 - 329  Prior U/S: 8/19 (u/s x 2) - no evidence of IUP or ectopic  Past Medical History  Diagnosis Date  . Syncope and collapse     neurocardiogenic  . Hypertension   . PSVT (paroxysmal supraventricular tachycardia)   . Kidney stones     history of  . Heart palpitations     long history of   . History of migraine headaches   . History of chest pain   . Sleeping difficulties   . History of cholecystitis     Laparoscopic cholecystectomy  . History of tilt table evaluation     IMPRESSION: Positive tilt table study with significant cardioinhibitory  response.  Marland Kitchen. History of seizure disorder     remote history of seizure activity and [facility]  She is presently not on any seizure  medications and has not had any seizures for many years.  . History of fatigue   . Stress   . Pericarditis 09/13/2010  . Costochondritis 09/13/2010  . GERD (gastroesophageal reflux disease)   . Anxiety   . Hyperlipidemia     No prescriptions prior to admission    Allergies  Allergen Reactions  . Dilaudid [Hydromorphone Hcl]     "I just about crawled out of my skin"  . Quinolones Nausea Only    Review of Systems - Negative except as noted in HPI  Objective BP 139/94  Pulse 94  Temp(Src) 98.7 F (37.1 C) (Oral)  Resp 16  SpO2 100%  LMP 12/30/2013  General: Alert, oriented, no acute distress  Results for orders placed during the hospital encounter of 02/12/14 (from the past 24 hour(s))  HCG, QUANTITATIVE, PREGNANCY     Status: Abnormal   Collection Time    02/12/14  9:00 AM      Result Value Ref Range   hCG, Beta Chain, Quant, S 667 (*) <5 mIU/mL    Assessment/Plan 1. Abdominal pain in pregnancy, antepartum   F/u in 1 week for rpt u/s, ectopic precautions rev'd    Medication List          omeprazole 40 MG capsule  Commonly known as:  PRILOSEC  Take 40 mg by mouth daily.     oxyCODONE-acetaminophen 5-325 MG per tablet  Commonly known as:  PERCOCET/ROXICET  Take 1-2 tablets by mouth every 6 (six) hours as needed for severe pain.     promethazine 25 MG tablet  Commonly known as:  PHENERGAN  Take 0.5 tablets (12.5 mg total) by mouth every 6 (six) hours as needed for nausea or vomiting.        Follow-up Information   [facility] 1 week. (someone will call to schedule appointment)    Specialty:  Radiology   Contact information:   [address] [id number] [place] [address] [phone]       Follow-up Information   [facility] with [facility] In 1 week. (someone will call to schedule appointment)    Specialty:  Radiology   Contact information:   [address] [id number] [place] [address] [phone]      Liadan [name] 2:13 PM 02/12/14

## 2014-02-12 NOTE — MAU Note (Signed)
[facility]. Has intermittent sharp shooting pain, denies bleeding.

## 2014-02-15 NOTE — MAU Provider Note (Signed)
Attestation of Attending Supervision of Advanced Practitioner (PA/CNM/NP): Evaluation and management procedures were performed by the Advanced Practitioner under my supervision and collaboration.  I have reviewed the Advanced Practitioner's note and chart, and I agree with the management and plan.  Shevawn Langenberg, MD, FACOG Attending Obstetrician & Gynecologist Faculty Practice, [facility]   

## 2014-04-26 ENCOUNTER — Encounter (HOSPITAL_COMMUNITY): Payer: Self-pay | Admitting: Advanced Practice Midwife

## 2014-06-25 HISTORY — PX: PACEMAKER INSERTION: SHX728

## 2014-08-22 ENCOUNTER — Encounter: Payer: Self-pay | Admitting: *Deleted

## 2014-08-22 ENCOUNTER — Emergency Department (INDEPENDENT_AMBULATORY_CARE_PROVIDER_SITE_OTHER)
Admission: EM | Admit: 2014-08-22 | Discharge: 2014-08-22 | Disposition: A | Discharge status: Home / Self Care | Payer: Managed Care, Other (non HMO) | Source: Home / Self Care

## 2014-08-22 DIAGNOSIS — S68119A Complete traumatic metacarpophalangeal amputation of unspecified finger, initial encounter: Secondary | ICD-10-CM

## 2014-08-22 DIAGNOSIS — S61209A Unspecified open wound of unspecified finger without damage to nail, initial encounter: Secondary | ICD-10-CM

## 2014-08-22 DIAGNOSIS — Z23 Encounter for immunization: Secondary | ICD-10-CM

## 2014-08-22 MED ORDER — HYDROCODONE-ACETAMINOPHEN 5-325 MG PO TABS: ORAL_TABLET | ORAL | Status: DC

## 2014-08-22 MED ORDER — TETANUS-DIPHTH-ACELL PERTUSSIS 5-2.5-18.5 LF-MCG/0.5 IM SUSP
0.5000 mL | Freq: Once | INTRAMUSCULAR | Status: AC
  Administered 2014-08-22: 0.5 mL via INTRAMUSCULAR

## 2014-08-22 MED ORDER — KETOROLAC TROMETHAMINE 60 MG/2ML IM SOLN
60.0000 mg | Freq: Once | INTRAMUSCULAR | Status: AC
  Administered 2014-08-22: 60 mg via INTRAMUSCULAR

## 2014-08-22 NOTE — ED Notes (Signed)
Pt cut thumb and middle finger on R hand while using a new slicer in her kitchen.  The bleeding has not stopped and it happened approx 45 minutes ago.  Pain 10/10

## 2014-08-22 NOTE — ED Provider Notes (Signed)
CSN: [id number]     Arrival date & time 08/22/14  1740 History   None    Chief Complaint  Patient presents with  . Extremity Laceration    2 fingers R hand      HPI Comments: Patient cut her thumb and middle finger on right hand while using a new slicer in her kitchen. The bleeding has not stopped and it happened approx 45 minutes ago. Pain 10/10.  Not sure of last Tdap.      Patient is a 34 y.o. female presenting with skin laceration. The history is provided by the patient and the spouse.  Laceration Location:  Finger Finger laceration location:  R thumb and R middle finger Depth:  Through underlying tissue Quality: avulsion   Bleeding: controlled with pressure   Time since incident:  45 minutes Laceration mechanism:  Knife Pain details:    Quality:  Aching   Severity:  Severe   Timing:  Constant   Progression:  Unchanged Foreign body present:  No foreign bodies Relieved by:  Pressure Worsened by:  Pressure Tetanus status:  Out of date   Past Medical History  Diagnosis Date  . Syncope and collapse     neurocardiogenic  . Hypertension   . PSVT (paroxysmal supraventricular tachycardia)   . Kidney stones     history of  . Heart palpitations     long history of   . History of migraine headaches   . History of chest pain   . Sleeping difficulties   . History of cholecystitis     Laparoscopic cholecystectomy  . History of tilt table evaluation     IMPRESSION: Positive tilt table study with significant cardioinhibitory  response.  Marland Kitchen History of seizure disorder     remote history of seizure activity and was evaluated at [facility] for this in the past  She is presently not on any seizure  medications and has not had any seizures for many years.  . History of fatigue   . Stress   . Pericarditis 09/13/2010  . Costochondritis 09/13/2010  . GERD (gastroesophageal reflux disease)   . Anxiety   . Hyperlipidemia    Past Surgical History  Procedure Laterality Date    . Cholecystectomy  11/2007    Cholecystitis - Laparoscopic cholecystectomy with intraoperative  cholangiogram.  . Tilt table study  07/17/2005    Positive tilt table study with significant cardioinhibitory  response.  . Cholecystectomy    . Pacemaker insertion  2016   Family History  Problem Relation Age of Onset  . Hypertension Mother   . Heart disease Mother   . Cancer Mother     breast  . Heart attack Father   . Hyperlipidemia Father   . Hepatitis C Father   . Cancer Maternal Aunt     Breast   History  Substance Use Topics  . Smoking status: Never Smoker   . Smokeless tobacco: Never Used  . Alcohol Use: No   OB History    Gravida Para Term Preterm AB TAB SAB Ectopic Multiple Living   Review of Systems  All other systems reviewed and are negative.   Allergies  Dilaudid and Quinolones  Home Medications   Prior to Admission medications   Medication Sig Start Date End Date Taking? Authorizing Provider  metoprolol (LOPRESSOR) 50 MG tablet Take 50 mg by mouth 2 (two) times daily.   Yes Historical  Provider, MD  sertraline (ZOLOFT) 50 MG tablet Take 50 mg by mouth daily.   Yes Historical Provider, MD  HYDROcodone-acetaminophen (NORCO/VICODIN) 5-325 MG per tablet Take one by mouth at bedtime as needed for pain 08/22/14   Lattie HawStephen A Apolonia Ellwood, MD  omeprazole (PRILOSEC) 40 MG capsule Take 40 mg by mouth daily.      Historical Provider, MD  oxyCODONE-acetaminophen (PERCOCET/ROXICET) 5-325 MG per tablet Take 1-2 tablets by mouth every 6 (six) hours as needed for severe pain. 02/10/14   Marny LowensteinJulie N Wenzel, PA-C  promethazine (PHENERGAN) 25 MG tablet Take 0.5 tablets (12.5 mg total) by mouth every 6 (six) hours as needed for nausea or vomiting. 02/10/14   Iona HansenJennifer Irene Rasch, NP   Pulse 83  Temp(Src) 98.3 F (36.8 C) (Oral)  Wt 190 lb (86.183 kg)  SpO2 99%  LMP  (LMP Unknown)  Breastfeeding? Unknown Physical Exam  Constitutional: She is oriented to person, place,  and time. She appears well-developed and well-nourished. No distress.  HENT:  Head: Atraumatic.  Eyes: Pupils are equal, round, and reactive to light.  Musculoskeletal:       Hands: Right thumb tip and right third finger tips have superficial cutaneous avulsions, each about 1cm2 area.  Neurological: She is alert and oriented to person, place, and time.  Skin: Skin is warm and dry.    ED Course  Procedures  Fingertip wounds cleansed with Betadine and lavaged with saline.  Bleeding controlled with pressure and Celox hemostatic powder.  Applied Xeroform gauze dressing and light compression dressing.          MDM   1. Avulsion of fingertip, initial encounter    Toradol 60mg  IM.  Tdap administered.  Rx for Vicodin 5-325 for pain at night.  Leave bandage in place until followup visit tomorrow.  Elevate hand as much as possible.  May take Tylenol or Ibuprofen daytime for pain. Return tomorrow for follow-up     Lattie HawStephen A Adanya Sosinski, MD 08/23/14 [phone]

## 2014-08-22 NOTE — Discharge Instructions (Signed)
Leave bandage in place until followup visit tomorrow.  Elevate hand as much as possible.  May take Tylenol or Ibuprofen daytime for pain.

## 2014-08-23 ENCOUNTER — Emergency Department
Admission: EM | Admit: 2014-08-23 | Discharge: 2014-08-23 | Disposition: A | Discharge status: Home / Self Care | Payer: Managed Care, Other (non HMO) | Source: Home / Self Care

## 2014-08-23 ENCOUNTER — Encounter: Payer: Self-pay | Admitting: *Deleted

## 2014-08-23 NOTE — ED Notes (Signed)
The pt is here today for a dressing change.

## 2014-08-25 ENCOUNTER — Emergency Department
Admission: EM | Admit: 2014-08-25 | Discharge: 2014-08-25 | Disposition: A | Discharge status: Home / Self Care | Payer: Managed Care, Other (non HMO) | Source: Home / Self Care | Attending: Family Medicine | Admitting: Family Medicine

## 2014-08-25 ENCOUNTER — Encounter: Payer: Self-pay | Admitting: *Deleted

## 2014-08-25 DIAGNOSIS — Z4800 Encounter for change or removal of nonsurgical wound dressing: Secondary | ICD-10-CM

## 2014-08-25 NOTE — ED Notes (Signed)
Carmen [name] is here for a dressing change from her finger lacerations on 08/22/14.

## 2014-08-25 NOTE — ED Provider Notes (Signed)
CSN: [id number]     Arrival date & time 08/25/14  0912 History   First MD Initiated Contact with Patient 08/25/14 1014     Chief Complaint  Patient presents with  . Dressing Change      HPI Comments: Patient returns for follow-up and dressing change of fingertip avulsion wounds.  She has no complaints and states that pain has decreased.  The history is provided by the patient.    Past Medical History  Diagnosis Date  . Syncope and collapse     neurocardiogenic  . Hypertension   . PSVT (paroxysmal supraventricular tachycardia)   . Kidney stones     history of  . Heart palpitations     long history of   . History of migraine headaches   . History of chest pain   . Sleeping difficulties   . History of cholecystitis     Laparoscopic cholecystectomy  . History of tilt table evaluation     IMPRESSION: Positive tilt table study with significant cardioinhibitory  response.  Marland Kitchen History of seizure disorder     remote history of seizure activity and was evaluated at [facility] for this in the past  She is presently not on any seizure  medications and has not had any seizures for many years.  . History of fatigue   . Stress   . Pericarditis 09/13/2010  . Costochondritis 09/13/2010  . GERD (gastroesophageal reflux disease)   . Anxiety   . Hyperlipidemia    Past Surgical History  Procedure Laterality Date  . Cholecystectomy  11/2007    Cholecystitis - Laparoscopic cholecystectomy with intraoperative  cholangiogram.  . Tilt table study  07/17/2005    Positive tilt table study with significant cardioinhibitory  response.  . Cholecystectomy    . Pacemaker insertion  2016   Family History  Problem Relation Age of Onset  . Hypertension Mother   . Heart disease Mother   . Cancer Mother     breast  . Heart attack Father   . Hyperlipidemia Father   . Hepatitis C Father   . Cancer Maternal Aunt     Breast   History  Substance Use Topics  . Smoking status: Never Smoker   .  Smokeless tobacco: Never Used  . Alcohol Use: No   OB History    Gravida Para Term Preterm AB TAB SAB Ectopic Multiple Living   Review of Systems No fevers, chills, and sweats.  No fingertip drainage, and no increase in pain. Allergies  Dilaudid and Quinolones  Home Medications   Prior to Admission medications   Medication Sig Start Date End Date Taking? Authorizing Provider  HYDROcodone-acetaminophen (NORCO/VICODIN) 5-325 MG per tablet Take one by mouth at bedtime as needed for pain 08/22/14   Lattie Haw, MD  metoprolol (LOPRESSOR) 50 MG tablet Take 50 mg by mouth 2 (two) times daily.    Historical Provider, MD  omeprazole (PRILOSEC) 40 MG capsule Take 40 mg by mouth daily.      Historical Provider, MD  oxyCODONE-acetaminophen (PERCOCET/ROXICET) 5-325 MG per tablet Take 1-2 tablets by mouth every 6 (six) hours as needed for severe pain. 02/10/14   Marny Lowenstein, PA-C  promethazine (PHENERGAN) 25 MG tablet Take 0.5 tablets (12.5 mg total) by mouth every 6 (six) hours as needed for nausea or vomiting. 02/10/14   Iona Hansen Rasch, NP  sertraline (ZOLOFT) 50 MG tablet Take  50 mg by mouth daily.    Historical Provider, MD   Temp(Src) 98.4 F (36.9 C) (Oral)  LMP  (LMP Unknown) Physical Exam Patient appears comfortable and in no distress.  After removing fingertip bandages, wounds show good granulation tissue and no evidence cellulitis. ED Course  Procedures  none     MDM   1. Dressing change or removal, nonsurgical wound; no evidence cellulitis     Remove dressing every 3 days and reapply Mepelex Lite dressing until healed.  Given extra pack of Mepelex Lite dressing.  Return for any signs of infection.    Lattie HawStephen A Melonie Germani, MD 08/26/14 2200

## 2014-08-25 NOTE — Discharge Instructions (Signed)
Remove dressing every 3 days and reapply Mepelex Lite dressing until healed.  Return for any signs of infection.

## 2015-06-21 ENCOUNTER — Emergency Department (HOSPITAL_BASED_OUTPATIENT_CLINIC_OR_DEPARTMENT_OTHER)
Admission: EM | Admit: 2015-06-21 | Discharge: 2015-06-21 | Disposition: A | Discharge status: Home / Self Care | Payer: Federal, State, Local not specified - PPO | Attending: Emergency Medicine | Admitting: Emergency Medicine

## 2015-06-21 ENCOUNTER — Encounter (HOSPITAL_BASED_OUTPATIENT_CLINIC_OR_DEPARTMENT_OTHER): Payer: Self-pay | Admitting: Emergency Medicine

## 2015-06-21 DIAGNOSIS — N390 Urinary tract infection, site not specified: Secondary | ICD-10-CM

## 2015-06-21 DIAGNOSIS — Z8639 Personal history of other endocrine, nutritional and metabolic disease: Secondary | ICD-10-CM | POA: Diagnosis not present

## 2015-06-21 DIAGNOSIS — F419 Anxiety disorder, unspecified: Secondary | ICD-10-CM | POA: Insufficient documentation

## 2015-06-21 DIAGNOSIS — I10 Essential (primary) hypertension: Secondary | ICD-10-CM | POA: Diagnosis not present

## 2015-06-21 DIAGNOSIS — Z8669 Personal history of other diseases of the nervous system and sense organs: Secondary | ICD-10-CM | POA: Diagnosis not present

## 2015-06-21 DIAGNOSIS — R109 Unspecified abdominal pain: Secondary | ICD-10-CM

## 2015-06-21 DIAGNOSIS — R300 Dysuria: Secondary | ICD-10-CM | POA: Diagnosis present

## 2015-06-21 DIAGNOSIS — Z3202 Encounter for pregnancy test, result negative: Secondary | ICD-10-CM | POA: Diagnosis not present

## 2015-06-21 DIAGNOSIS — Z79899 Other long term (current) drug therapy: Secondary | ICD-10-CM | POA: Insufficient documentation

## 2015-06-21 DIAGNOSIS — Z8739 Personal history of other diseases of the musculoskeletal system and connective tissue: Secondary | ICD-10-CM | POA: Diagnosis not present

## 2015-06-21 DIAGNOSIS — K219 Gastro-esophageal reflux disease without esophagitis: Secondary | ICD-10-CM | POA: Insufficient documentation

## 2015-06-21 DIAGNOSIS — Z87442 Personal history of urinary calculi: Secondary | ICD-10-CM | POA: Diagnosis not present

## 2015-06-21 LAB — URINALYSIS, ROUTINE W REFLEX MICROSCOPIC
Bilirubin Urine: NEGATIVE
Glucose, UA: NEGATIVE mg/dL
Ketones, ur: NEGATIVE mg/dL
Nitrite: NEGATIVE
Protein, ur: NEGATIVE mg/dL
Specific Gravity, Urine: 1.026 (ref 1.005–1.030)
pH: 5.5 (ref 5.0–8.0)

## 2015-06-21 LAB — URINE MICROSCOPIC-ADD ON

## 2015-06-21 LAB — PREGNANCY, URINE: Preg Test, Ur: NEGATIVE

## 2015-06-21 MED ORDER — IBUPROFEN 400 MG PO TABS
600.0000 mg | ORAL_TABLET | Freq: Once | ORAL | Status: AC
  Administered 2015-06-21: 600 mg via ORAL
  Filled 2015-06-21: qty 1

## 2015-06-21 MED ORDER — SULFAMETHOXAZOLE-TRIMETHOPRIM 800-160 MG PO TABS
1.0000 | ORAL_TABLET | Freq: Once | ORAL | Status: AC
  Administered 2015-06-21: 1 via ORAL
  Filled 2015-06-21: qty 1

## 2015-06-21 MED ORDER — OXYCODONE-ACETAMINOPHEN 5-325 MG PO TABS
1.0000 | ORAL_TABLET | Freq: Once | ORAL | Status: DC
  Filled 2015-06-21: qty 1

## 2015-06-21 MED ORDER — TRAMADOL HCL 50 MG PO TABS: 50.0000 mg | ORAL_TABLET | Freq: Four times a day (QID) | ORAL | Status: DC | PRN

## 2015-06-21 MED ORDER — SULFAMETHOXAZOLE-TRIMETHOPRIM 800-160 MG PO TABS: 1.0000 | ORAL_TABLET | Freq: Once | ORAL | Status: DC

## 2015-06-21 NOTE — Discharge Instructions (Signed)
Dysuria °Dysuria is pain or discomfort while urinating. The pain or discomfort may be felt in the tube that carries urine out of the bladder (urethra) or in the surrounding tissue of the genitals. The pain may also be felt in the groin area, lower abdomen, and lower back. You may have to urinate frequently or have the sudden feeling that you have to urinate (urgency). Dysuria can affect both men and women, but is more common in women. °Dysuria can be caused by many different things, including: °· Urinary tract infection in women. °· Infection of the kidney or bladder. °· Kidney stones or bladder stones. °· Certain sexually transmitted infections (STIs), such as chlamydia. °· Dehydration. °· Inflammation of the vagina. °· Use of certain medicines. °· Use of certain soaps or scented products that cause irritation. °HOME CARE INSTRUCTIONS °Watch your dysuria for any changes. The following actions may help to reduce any discomfort you are feeling: °· Drink enough fluid to keep your urine clear or pale yellow. °· Empty your bladder often. Avoid holding urine for long periods of time. °· After a bowel movement or urination, women should cleanse from front to back, using each tissue only once. °· Empty your bladder after sexual intercourse. °· Take medicines only as directed by your health care provider. °· If you were prescribed an antibiotic medicine, finish it all even if you start to feel better. °· Avoid caffeine, tea, and alcohol. They can irritate the bladder and make dysuria worse. In men, alcohol may irritate the prostate. °· Keep all follow-up visits as directed by your health care provider. This is important. °· If you had any tests done to find the cause of dysuria, it is your responsibility to obtain your test results. Ask the lab or department performing the test when and how you will get your results. Talk with your health care provider if you have any questions about your results. °SEEK MEDICAL CARE  IF: °· You develop pain in your back or sides. °· You have a fever. °· You have nausea or vomiting. °· You have blood in your urine. °· You are not urinating as often as you usually do. °SEEK IMMEDIATE MEDICAL CARE IF: °· You pain is severe and not relieved with medicines. °· You are unable to hold down any fluids. °· You or someone else notices a change in your mental function. °· You have a rapid heartbeat at rest. °· You have shaking or chills. °· You feel extremely weak. °  °This information is not intended to replace advice given to you by your health care provider. Make sure you discuss any questions you have with your health care provider. °  °Document Released: 03/09/2004 Document Revised: 07/02/2014 Document Reviewed: 02/04/2014 °Elsevier Interactive Patient Education ©2016 Elsevier Inc. ° °Urinary Tract Infection °Urinary tract infections (UTIs) can develop anywhere along your urinary tract. Your urinary tract is your body's drainage system for removing wastes and extra water. Your urinary tract includes two kidneys, two ureters, a bladder, and a urethra. Your kidneys are a pair of bean-shaped organs. Each kidney is about the size of your fist. They are located below your ribs, one on each side of your spine. °CAUSES °Infections are caused by microbes, which are microscopic organisms, including fungi, viruses, and bacteria. These organisms are so small that they can only be seen through a microscope. Bacteria are the microbes that most commonly cause UTIs. °SYMPTOMS  °Symptoms of UTIs may vary by age and gender of the patient   and by the location of the infection. Symptoms in young women typically include a frequent and intense urge to urinate and a painful, burning feeling in the bladder or urethra during urination. Older women and men are more likely to be tired, shaky, and weak and have muscle aches and abdominal pain. A fever may mean the infection is in your kidneys. Other symptoms of a kidney  infection include pain in your back or sides below the ribs, nausea, and vomiting. °DIAGNOSIS °To diagnose a UTI, your caregiver will ask you about your symptoms. Your caregiver will also ask you to provide a urine sample. The urine sample will be tested for bacteria and white blood cells. White blood cells are made by your body to help fight infection. °TREATMENT  °Typically, UTIs can be treated with medication. Because most UTIs are caused by a bacterial infection, they usually can be treated with the use of antibiotics. The choice of antibiotic and length of treatment depend on your symptoms and the type of bacteria causing your infection. °HOME CARE INSTRUCTIONS °· If you were prescribed antibiotics, take them exactly as your caregiver instructs you. Finish the medication even if you feel better after you have only taken some of the medication. °· Drink enough water and fluids to keep your urine clear or pale yellow. °· Avoid caffeine, tea, and carbonated beverages. They tend to irritate your bladder. °· Empty your bladder often. Avoid holding urine for long periods of time. °· Empty your bladder before and after sexual intercourse. °· After a bowel movement, women should cleanse from front to back. Use each tissue only once. °SEEK MEDICAL CARE IF:  °· You have back pain. °· You develop a fever. °· Your symptoms do not begin to resolve within 3 days. °SEEK IMMEDIATE MEDICAL CARE IF:  °· You have severe back pain or lower abdominal pain. °· You develop chills. °· You have nausea or vomiting. °· You have continued burning or discomfort with urination. °MAKE SURE YOU:  °· Understand these instructions. °· Will watch your condition. °· Will get help right away if you are not doing well or get worse. °  °This information is not intended to replace advice given to you by your health care provider. Make sure you discuss any questions you have with your health care provider. °  °Document Released: 03/21/2005 Document  Revised: 03/02/2015 Document Reviewed: 07/20/2011 °Elsevier Interactive Patient Education ©2016 Elsevier Inc. ° °

## 2015-06-21 NOTE — ED Notes (Signed)
Pt having right sided flank pain radiating to front groin.  Pt having some urgency and frequency.  Pain started this am.

## 2015-06-21 NOTE — ED Notes (Signed)
Pt refused oxycodone because she is by herself and driving.

## 2015-06-22 LAB — URINE CULTURE

## 2015-07-01 NOTE — ED Provider Notes (Signed)
CSN: [id number]     Arrival date & time 06/21/15  0801 History   First MD Initiated Contact with Patient 06/21/15 [phone]     Chief Complaint  Patient presents with  . Dysuria     (Consider location/radiation/quality/duration/timing/severity/associated sxs/prior Treatment) HPI   34yF with R flank pain radiating to RLQ. Onset this morning. Persistent since. No appreciable exacerbating or relieving factors. Associated dysuria and urgency. No n/v. No unusual vaginal bleeding or discharge. No fever or chills.   Past Medical History  Diagnosis Date  . Syncope and collapse     neurocardiogenic  . Hypertension   . PSVT (paroxysmal supraventricular tachycardia) (HCC)   . Kidney stones     history of  . Heart palpitations     long history of   . History of migraine headaches   . History of chest pain   . Sleeping difficulties   . History of cholecystitis     Laparoscopic cholecystectomy  . History of tilt table evaluation     IMPRESSION: Positive tilt table study with significant cardioinhibitory  response.  Marland Kitchen. History of seizure disorder     remote history of seizure activity and was evaluated at [facility] for this in the past  She is presently not on any seizure  medications and has not had any seizures for many years.  . History of fatigue   . Stress   . Pericarditis 09/13/2010  . Costochondritis 09/13/2010  . GERD (gastroesophageal reflux disease)   . Anxiety   . Hyperlipidemia    Past Surgical History  Procedure Laterality Date  . Cholecystectomy  11/2007    Cholecystitis - Laparoscopic cholecystectomy with intraoperative  cholangiogram.  . Tilt table study  07/17/2005    Positive tilt table study with significant cardioinhibitory  response.  . Cholecystectomy    . Pacemaker insertion  2016   Family History  Problem Relation Age of Onset  . Hypertension Mother   . Heart disease Mother   . Cancer Mother     breast  . Heart attack Father   . Hyperlipidemia Father   .  Hepatitis C Father   . Cancer Maternal Aunt     Breast   Social History  Substance Use Topics  . Smoking status: Never Smoker   . Smokeless tobacco: Never Used  . Alcohol Use: No   OB History    Gravida Para Term Preterm AB TAB SAB Ectopic Multiple Living   2 1 1       1      Review of Systems  All systems reviewed and negative, other than as noted in HPI.   Allergies  Dilaudid and Quinolones  Home Medications   Prior to Admission medications   Medication Sig Start Date End Date Taking? Authorizing Provider  FLUoxetine (PROZAC) 20 MG capsule Take 20 mg by mouth daily.   Yes Historical Provider, MD  methylphenidate 36 MG PO CR tablet Take 36 mg by mouth daily.   Yes Historical Provider, MD  metoprolol succinate (TOPROL-XL) 50 MG 24 hr tablet Take 50 mg by mouth 2 (two) times daily. Take with or immediately following a meal.   Yes Historical Provider, MD  metoprolol (LOPRESSOR) 50 MG tablet Take 50 mg by mouth 2 (two) times daily.    Historical Provider, MD  omeprazole (PRILOSEC) 40 MG capsule Take 40 mg by mouth daily.      Historical Provider, MD  sulfamethoxazole-trimethoprim (BACTRIM DS,SEPTRA DS) 800-160 MG tablet Take 1 tablet by mouth  once. 06/21/15   Raeford Razor, MD  traMADol (ULTRAM) 50 MG tablet Take 1 tablet (50 mg total) by mouth every 6 (six) hours as needed. 06/21/15   Raeford Razor, MD   BP 138/98 mmHg  Pulse 70  Temp(Src) 98.5 F (36.9 C) (Oral)  Resp 20  Ht 5\' 3"  (1.6 m)  Wt 198 lb (89.812 kg)  BMI 35.08 kg/m2  SpO2 100% Physical Exam  Constitutional: She appears well-developed and well-nourished. No distress.  HENT:  Head: Normocephalic and atraumatic.  Eyes: Conjunctivae are normal. Right eye exhibits no discharge. Left eye exhibits no discharge.  Neck: Neck supple.  Cardiovascular: Normal rate, regular rhythm and normal heart sounds.  Exam reveals no gallop and no friction rub.   No murmur heard. Pulmonary/Chest: Effort normal and breath sounds  normal. No respiratory distress.  Abdominal: Soft. She exhibits no distension. There is no tenderness.  Genitourinary:  No cva tenderness  Musculoskeletal: She exhibits no edema or tenderness.  Neurological: She is alert.  Skin: Skin is warm and dry.  Psychiatric: She has a normal mood and affect. Her behavior is normal. Thought content normal.  Nursing note and vitals reviewed.   ED Course  Procedures (including critical care time) Labs Review Labs Reviewed  URINALYSIS, ROUTINE W REFLEX MICROSCOPIC (NOT AT [facility]) - Abnormal; Notable for the following:    APPearance CLOUDY (*)    Hgb urine dipstick LARGE (*)    Leukocytes, UA SMALL (*)    All other components within normal limits  URINE MICROSCOPIC-ADD ON - Abnormal; Notable for the following:    Squamous Epithelial / LPF 6-30 (*)    Bacteria, UA MANY (*)    All other components within normal limits  URINE CULTURE  PREGNANCY, URINE    Imaging Review No results found. I have personally reviewed and evaluated these images and lab results as part of my medical decision-making.   EKG Interpretation None      MDM   Final diagnoses:  UTI (lower urinary tract infection)  Flank pain    34yF with dysuria. Afebrile. Nontoxic. Benign abdominal exam.  Possible uti. Send culture. Abx.     Raeford Razor, MD 07/01/15 1147

## 2016-06-08 ENCOUNTER — Encounter: Payer: Self-pay | Admitting: Obstetrics and Gynecology

## 2016-07-13 ENCOUNTER — Encounter (HOSPITAL_COMMUNITY): Payer: Self-pay

## 2016-07-13 ENCOUNTER — Emergency Department (HOSPITAL_COMMUNITY)
Admission: EM | Admit: 2016-07-13 | Discharge: 2016-07-13 | Disposition: A | Discharge status: Home / Self Care | Payer: Managed Care, Other (non HMO) | Attending: Emergency Medicine | Admitting: Emergency Medicine

## 2016-07-13 DIAGNOSIS — Z950 Presence of cardiac pacemaker: Secondary | ICD-10-CM | POA: Insufficient documentation

## 2016-07-13 DIAGNOSIS — I10 Essential (primary) hypertension: Secondary | ICD-10-CM | POA: Insufficient documentation

## 2016-07-13 DIAGNOSIS — R55 Syncope and collapse: Secondary | ICD-10-CM | POA: Insufficient documentation

## 2016-07-13 DIAGNOSIS — K529 Noninfective gastroenteritis and colitis, unspecified: Secondary | ICD-10-CM

## 2016-07-13 DIAGNOSIS — Z79899 Other long term (current) drug therapy: Secondary | ICD-10-CM | POA: Insufficient documentation

## 2016-07-13 LAB — BASIC METABOLIC PANEL
Anion gap: 8 (ref 5–15)
BUN: 7 mg/dL (ref 6–20)
CO2: 21 mmol/L — ABNORMAL LOW (ref 22–32)
Calcium: 9.3 mg/dL (ref 8.9–10.3)
Chloride: 109 mmol/L (ref 101–111)
Creatinine, Ser: 0.93 mg/dL (ref 0.44–1.00)
GFR calc Af Amer: >60 mL/min (ref >60)
GFR calc non Af Amer: >60 mL/min (ref >60)
Glucose, Bld: 96 mg/dL (ref 65–99)
Potassium: 3.8 mmol/L (ref 3.5–5.1)
Sodium: 138 mmol/L (ref 135–145)

## 2016-07-13 LAB — CBC
HCT: 43 % (ref 36.0–46.0)
Hemoglobin: 15.3 g/dL — ABNORMAL HIGH (ref 12.0–15.0)
MCH: 31 pg (ref 26.0–34.0)
MCHC: 35.6 g/dL (ref 30.0–36.0)
MCV: 87.2 fL (ref 78.0–100.0)
Platelets: 262 10*3/uL (ref 150–400)
RBC: 4.93 MIL/uL (ref 3.87–5.11)
RDW: 13.3 % (ref 11.5–15.5)
WBC: 6.9 10*3/uL (ref 4.0–10.5)

## 2016-07-13 LAB — I-STAT BETA HCG BLOOD, ED (MC, WL, AP ONLY): I-stat hCG, quantitative: <5 m[IU]/mL (ref <5)

## 2016-07-13 LAB — URINALYSIS, ROUTINE W REFLEX MICROSCOPIC
Bilirubin Urine: NEGATIVE
Glucose, UA: NEGATIVE mg/dL
Hgb urine dipstick: NEGATIVE
Ketones, ur: NEGATIVE mg/dL
Leukocytes, UA: NEGATIVE
Nitrite: NEGATIVE
Protein, ur: NEGATIVE mg/dL
Specific Gravity, Urine: 1.017 (ref 1.005–1.030)
pH: 5 (ref 5.0–8.0)

## 2016-07-13 MED ORDER — ONDANSETRON HCL 4 MG/2ML IJ SOLN
4.0000 mg | Freq: Once | INTRAMUSCULAR | Status: AC
  Administered 2016-07-13: 4 mg via INTRAVENOUS
  Filled 2016-07-13: qty 2

## 2016-07-13 MED ORDER — DIPHENOXYLATE-ATROPINE 2.5-0.025 MG PO TABS
2.0000 | ORAL_TABLET | Freq: Once | ORAL | Status: AC
  Administered 2016-07-13: 2 via ORAL
  Filled 2016-07-13: qty 2

## 2016-07-13 MED ORDER — SODIUM CHLORIDE 0.9 % IV BOLUS (SEPSIS)
2000.0000 mL | Freq: Once | INTRAVENOUS | Status: AC
  Administered 2016-07-13: 2000 mL via INTRAVENOUS

## 2016-07-13 NOTE — ED Triage Notes (Signed)
PER EMS: pt from work; reports feeling bad the past few days but today she felt more weak and nauseous than normal. Around noon she told co-workers "I dont feel so well" and they said she became pale, cool and clammy and then had syncopal episode lasting about 45 seconds. Co-workers lowered her to ground, no fall. Pt was A&Ox4 upon EMS arrival. Pt on abx for UTI, started abx yesterday. 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 Cardiologist is with [facility]. BP-134/70, HR-72, CBG-92, RR-16, 100% RA.

## 2016-07-13 NOTE — ED Provider Notes (Signed)
[facility]-EMERGENCY DEPT Provider Note   CSN: [id number] Arrival date & time: 07/13/16  1258     History   Chief Complaint Chief Complaint  Patient presents with  . Loss of Consciousness    HPI Carmen [name] is a 36 y.o. female. Chief complaint is syncope  HPI:  Patient presents for evaluation after episode of syncope/near syncope at her workplace. She's been ill for the last 2 days with vomiting and diarrhea. 2 days ago she had dysuria and called her family practice physician who called her in an antibiotic for possible UTI. She was aware. She was feeling lightheaded. 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 She states she felt the symptoms that she used to feel before passing out. However she felt like her pacemaker "took over" she was slumped against the wall and slid to the ground but did not have syncope and does not have amnesia for the event. No tongue biting, seizure activity, incontinence.  She used to be on Midrin and Florinef for syncope. Once she had her pacemaker she was taken off of these. 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 3 weeks ago. She queries as to whether the "diuretic" could be causing her symptoms as well.  Past Medical History:  Diagnosis Date  . Anxiety   . Costochondritis 09/13/2010  . GERD (gastroesophageal reflux disease)   . Heart palpitations    long history of   . History of chest pain   . History of cholecystitis    Laparoscopic cholecystectomy  . History of fatigue   . History of migraine headaches   . History of seizure disorder    remote history of seizure activity and was evaluated at [facility] for this in the past  She is presently not on any seizure  medications and has not had any seizures for many years.  . History of tilt table evaluation    IMPRESSION: Positive tilt table study with significant  cardioinhibitory  response.  Marland Kitchen. Hyperlipidemia   . Hypertension   . Kidney stones    history of  . Pericarditis 09/13/2010  . PSVT (paroxysmal supraventricular tachycardia) (HCC)   . Sleeping difficulties   . Stress   . Syncope and collapse    neurocardiogenic    Patient Active Problem List   Diagnosis Date Noted  . PSVT (paroxysmal supraventricular tachycardia) (HCC)   . Heart palpitations   . Syncope and collapse   . Sleeping difficulties   . Pericarditis 09/13/2010  . Hypertension 09/13/2010  . Syncope 09/13/2010  . Costochondritis 09/13/2010    Past Surgical History:  Procedure Laterality Date  . CHOLECYSTECTOMY  11/2007   Cholecystitis - Laparoscopic cholecystectomy with intraoperative  cholangiogram.  . CHOLECYSTECTOMY    . PACEMAKER INSERTION  2016  . Tilt Table Study  07/17/2005   Positive tilt table study with significant cardioinhibitory  response.    OB History    Gravida Para Term Preterm AB Living   2 1 1     1    SAB TAB Ectopic Multiple Live Births                   Home Medications    Prior to Admission medications   Medication Sig Start Date End Date Taking? Authorizing Provider  acetaZOLAMIDE (DIAMOX) 500 MG capsule Take 500 mg by mouth 2 (two) times daily.  Yes Historical Provider, MD  baclofen (LIORESAL) 10 MG tablet Take 10 mg by mouth 3 (three) times daily as needed for muscle spasms.   Yes Historical Provider, MD  chlorzoxazone (PARAFON FORTE DSC) 500 MG tablet Take 500 mg by mouth 4 (four) times daily as needed for muscle spasms. Headache, muscle spasms.   Yes Historical Provider, MD  FLUoxetine (PROZAC) 20 MG capsule Take 40 mg by mouth daily.    Yes Historical Provider, MD  levothyroxine (SYNTHROID, LEVOTHROID) 50 MCG tablet Take 50 mcg by mouth daily before breakfast.   Yes Historical Provider, MD  metoprolol succinate (TOPROL-XL) 50 MG 24 hr tablet Take 100 mg by mouth every morning. Take with or immediately following a meal.    Yes  Historical Provider, MD  nitrofurantoin (MACRODANTIN) 100 MG capsule Take 100 mg by mouth 2 (two) times daily. Started on 07-12-16 for 5 days   Yes Historical Provider, MD  omeprazole (PRILOSEC) 40 MG capsule Take 40 mg by mouth daily.     Yes Historical Provider, MD  SUMAtriptan (IMITREX) 50 MG tablet Take 50 mg by mouth every 2 (two) hours as needed for migraine. May repeat in 2 hours if headache persists or recurs.   Yes Historical Provider, MD  methylphenidate 36 MG PO CR tablet Take 36 mg by mouth daily.    Historical Provider, MD  metoprolol (LOPRESSOR) 50 MG tablet Take 50 mg by mouth 2 (two) times daily.    Historical Provider, MD  sulfamethoxazole-trimethoprim (BACTRIM DS,SEPTRA DS) 800-160 MG tablet Take 1 tablet by mouth once. Patient not taking: Reported on 07/13/2016 06/21/15   Raeford Razor, MD  traMADol (ULTRAM) 50 MG tablet Take 1 tablet (50 mg total) by mouth every 6 (six) hours as needed. Patient not taking: Reported on 07/13/2016 06/21/15   Raeford Razor, MD    Family History Family History  Problem Relation Age of Onset  . Hypertension Mother   . Heart disease Mother   . Cancer Mother     breast  . Heart attack Father   . Hyperlipidemia Father   . Hepatitis C Father   . Cancer Maternal Aunt     Breast    Social History Social History  Substance Use Topics  . Smoking status: Never Smoker  . Smokeless tobacco: Never Used  . Alcohol use No     Allergies   Dilaudid [hydromorphone hcl] and Quinolones   Review of Systems Review of Systems  Constitutional: Negative for appetite change, chills, diaphoresis, fatigue and fever.  HENT: Negative for mouth sores, sore throat and trouble swallowing.   Eyes: Negative for visual disturbance.  Respiratory: Negative for cough, chest tightness, shortness of breath and wheezing.   Cardiovascular: Negative for chest pain.       Syncope  Gastrointestinal: Positive for diarrhea, nausea and vomiting. Negative for abdominal  distention and abdominal pain.  Endocrine: Negative for polydipsia, polyphagia and polyuria.  Genitourinary: Positive for dysuria and urgency. Negative for frequency and hematuria.  Musculoskeletal: Negative for gait problem.  Skin: Negative for color change, pallor and rash.  Neurological: Negative for dizziness, syncope, light-headedness and headaches.  Hematological: Does not bruise/bleed easily.  Psychiatric/Behavioral: Negative for behavioral problems and confusion.     Physical Exam Updated Vital Signs BP 129/79 (BP Location: Left Arm)   Pulse 62   Temp 98.9 F (37.2 C) (Oral)   Resp 18   SpO2 99%   Physical Exam  Constitutional: She is oriented to person, place, and time. She appears  well-developed and well-nourished. No distress.  HENT:  Head: Normocephalic.  Eyes: Conjunctivae are normal. Pupils are equal, round, and reactive to light. No scleral icterus.  Neck: Normal range of motion. Neck supple. No thyromegaly present.  Cardiovascular: Normal rate and regular rhythm.  Exam reveals no gallop and no friction rub.   No murmur heard. Heart rate 60-65. Occasionally paced at 60. Occasionally at her native rhythm.  Pulmonary/Chest: Effort normal and breath sounds normal. No respiratory distress. She has no wheezes. She has no rales.  Abdominal: Soft. Bowel sounds are normal. She exhibits no distension. There is no tenderness. There is no rebound.  Musculoskeletal: Normal range of motion.  Neurological: She is alert and oriented to person, place, and time.  Skin: Skin is warm and dry. No rash noted.  Psychiatric: She has a normal mood and affect. Her behavior is normal.     ED Treatments / Results  Labs (all labs ordered are listed, but only abnormal results are displayed) Labs Reviewed  BASIC METABOLIC PANEL - Abnormal; Notable for the following:       Result Value   CO2 21 (*)    All other components within normal limits  CBC - Abnormal; Notable for the following:     Hemoglobin 15.3 (*)    All other components within normal limits  URINALYSIS, ROUTINE W REFLEX MICROSCOPIC - Abnormal; Notable for the following:    Color, Urine AMBER (*)    All other components within normal limits  I-STAT BETA HCG BLOOD, ED ([facility], [facility], [facility] ONLY)    EKG  EKG Interpretation  Date/Time:  Friday July 13 2016 13:03:03 EST Ventricular Rate:  65 PR Interval:    QRS Duration: 100 QT Interval:  425 QTC Calculation: 442 R Axis:   51 Text Interpretation:  Atrial-paced complexes New since previous tracing Otherwise no significant change Confirmed by KNOTT MD, DANIEL ([id number]) on 07/14/2016 7:08:48 PM       Radiology No results found.  Procedures Procedures (including critical care time)  Medications Ordered in ED Medications  sodium chloride 0.9 % bolus 2,000 mL (0 mLs Intravenous Stopped 07/13/16 1548)  ondansetron (ZOFRAN) injection 4 mg (4 mg Intravenous Given 07/13/16 1431)  diphenoxylate-atropine (LOMOTIL) 2.5-0.025 MG per tablet 2 tablet (2 tablets Oral Given 07/13/16 1429)     Initial Impression / Assessment and Plan / ED Course  I have reviewed the triage vital signs and the nursing notes.  Pertinent labs & imaging results that were available during my care of the patient were reviewed by me and considered in my medical decision making (see chart for details).   syncope, likely multifactorial.  Likely some dehydration secondary to her vomiting and diarrhea for the last 48 hours. Possibly related to her Diamox, although the diuretic and this is negligible. Anabiotic for her possible UTI may be contributing to her diarrhea. We'll check urine and rehydrate.  Pacemaker interrogation shows a dual-chamber pacemaker last checked 1/4. Device function normally. 0.3% ventricular paced. Atrial paced ventricular  63 bpm back up. No episodes of tachycardia events today.   Final Clinical Impressions(s) / ED Diagnoses   Final diagnoses:  Near syncope  Gastroenteritis      New Prescriptions Discharge Medication List as of 07/13/2016  3:22 PM       Rolland Porter, MD 07/17/16 2045

## 2016-07-13 NOTE — ED Notes (Signed)
Pt reports that she has not been feeling well for a few months and prior to syncopal episode today pt called cardiologist for a check up. Pt reports taking Diamox starting Dec 22nd and that is when she started to feel different. Pt reports she did not have her am dose of diamox. Pt also reports this is not like her syncopal episodes in the past because she remembers it and it was not as long as prior episodes. Pt reports this is the first episode she has had since her pacemaker was in place.

## 2016-07-13 NOTE — ED Notes (Signed)
Patient verbalized understanding of discharge instructions and denies any further needs or questions at this time. VS stable. Patient ambulatory with steady gait, RN escorted to ED entrance in wheelchair.   

## 2016-07-13 NOTE — Discharge Instructions (Signed)
Stop antibiotic. If urine culture shows need for additional antibiotics she will be contacted. Follow-up with your cardiologist. However, no indication that today's episode was a cardiac event. It is likely related to vomiting diarrhea, and dehydration.

## 2016-07-13 NOTE — ED Notes (Signed)
Iv removed and pt getting dressed at this time.

## 2017-02-26 ENCOUNTER — Emergency Department
Admission: EM | Admit: 2017-02-26 | Discharge: 2017-02-26 | Disposition: A | Discharge status: Home / Self Care | Payer: Managed Care, Other (non HMO) | Source: Home / Self Care

## 2017-02-26 ENCOUNTER — Encounter: Payer: Self-pay | Admitting: *Deleted

## 2017-02-26 DIAGNOSIS — T424X4A Poisoning by benzodiazepines, undetermined, initial encounter: Secondary | ICD-10-CM

## 2017-02-26 NOTE — ED Triage Notes (Signed)
Pt reports that she took approximately 40 tablets of Klonopin .5mg  at 10:25am. She was upset because her Autistic son had "an anger outburst" this morning while at his behavior health appt. She went home and took "a hand full" of Klonopin pills, but realized it was a bad choice and came to our facility.

## 2017-02-26 NOTE — ED Notes (Signed)
EMS arrived. Report given. Saline locked IV left in place for EMS. Clemens Catholichristy Miyuki Rzasa, LPN
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
## 2017-02-27 ENCOUNTER — Telehealth: Payer: Self-pay | Admitting: *Deleted

## 2017-02-27 NOTE — Telephone Encounter (Signed)
Attempted to call to check pts status NA and her VM was full. I was unable to leave a message.

## 2017-02-28 ENCOUNTER — Telehealth: Payer: Self-pay

## 2017-02-28 NOTE — Telephone Encounter (Signed)
Left VM to call UC

## 2017-03-01 ENCOUNTER — Telehealth: Payer: Self-pay | Admitting: *Deleted

## 2017-08-09 ENCOUNTER — Emergency Department (HOSPITAL_BASED_OUTPATIENT_CLINIC_OR_DEPARTMENT_OTHER)
Admission: EM | Admit: 2017-08-09 | Discharge: 2017-08-09 | Disposition: A | Discharge status: Home / Self Care | Payer: BLUE CROSS/BLUE SHIELD | Attending: Emergency Medicine | Admitting: Emergency Medicine

## 2017-08-09 ENCOUNTER — Encounter (HOSPITAL_BASED_OUTPATIENT_CLINIC_OR_DEPARTMENT_OTHER): Payer: Self-pay | Admitting: *Deleted

## 2017-08-09 ENCOUNTER — Other Ambulatory Visit: Payer: Self-pay

## 2017-08-09 ENCOUNTER — Emergency Department (HOSPITAL_BASED_OUTPATIENT_CLINIC_OR_DEPARTMENT_OTHER): Payer: BLUE CROSS/BLUE SHIELD

## 2017-08-09 DIAGNOSIS — Z950 Presence of cardiac pacemaker: Secondary | ICD-10-CM | POA: Diagnosis not present

## 2017-08-09 DIAGNOSIS — Y92411 Interstate highway as the place of occurrence of the external cause: Secondary | ICD-10-CM | POA: Insufficient documentation

## 2017-08-09 DIAGNOSIS — I10 Essential (primary) hypertension: Secondary | ICD-10-CM | POA: Insufficient documentation

## 2017-08-09 DIAGNOSIS — Y9389 Activity, other specified: Secondary | ICD-10-CM | POA: Insufficient documentation

## 2017-08-09 DIAGNOSIS — S1093XA Contusion of unspecified part of neck, initial encounter: Secondary | ICD-10-CM | POA: Diagnosis not present

## 2017-08-09 DIAGNOSIS — Y999 Unspecified external cause status: Secondary | ICD-10-CM | POA: Diagnosis not present

## 2017-08-09 DIAGNOSIS — T07XXXA Unspecified multiple injuries, initial encounter: Secondary | ICD-10-CM

## 2017-08-09 DIAGNOSIS — Z79899 Other long term (current) drug therapy: Secondary | ICD-10-CM | POA: Insufficient documentation

## 2017-08-09 DIAGNOSIS — S20229A Contusion of unspecified back wall of thorax, initial encounter: Secondary | ICD-10-CM | POA: Diagnosis not present

## 2017-08-09 DIAGNOSIS — S20219A Contusion of unspecified front wall of thorax, initial encounter: Secondary | ICD-10-CM | POA: Diagnosis not present

## 2017-08-09 DIAGNOSIS — S299XXA Unspecified injury of thorax, initial encounter: Secondary | ICD-10-CM | POA: Diagnosis present

## 2017-08-09 MED ORDER — IBUPROFEN 600 MG PO TABS: 600.0000 mg | ORAL_TABLET | Freq: Four times a day (QID) | ORAL | 0 refills | Status: DC | PRN

## 2017-08-09 MED ORDER — NAPROXEN 250 MG PO TABS
500.0000 mg | ORAL_TABLET | Freq: Once | ORAL | Status: AC
  Administered 2017-08-09: 500 mg via ORAL
  Filled 2017-08-09: qty 2
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
## 2017-08-09 NOTE — ED Notes (Signed)
Pt verbalizes understanding of d/c instructions and denies any further needs at this time. 
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
## 2017-10-15 ENCOUNTER — Other Ambulatory Visit: Payer: Self-pay | Admitting: Obstetrics and Gynecology

## 2017-10-31 NOTE — Patient Instructions (Addendum)
Your procedure is scheduled on: Thursday Nov 14, 2017 at 8:30 am  Enter through the Main Entrance of [facility] at: 7:00 am  Pick up the phone at the desk and dial [phone].  Call this number if you have problems the morning of surgery: [phone].  Remember: Do NOT eat food or drink any liquids after: Midnight on Wednesday May 22  Take these medicines the morning of surgery with a SIP OF WATER: Allegra, Levothyroxine, Vyvanse, Metoprolol, oxcarbazepine, Venlafaxine XR, Xiidra eye drops   Prilosec, clonazepam, ranitidine, sumatriptan  STOP ALL VITAMINS, HERBAL MEDICATIONS,SUPPLEMENTS 1 WEEK PRIOR TO SURGERY  DO NOT SMOKE DAY OF SURGERY  Do NOT wear jewelry (body piercing), metal hair clips/bobby pins, make-up, or nail polish. Do NOT wear lotions, powders, or perfumes.  You may wear deoderant. Do NOT shave for 48 hours prior to surgery. Do NOT bring valuables to the hospital. Contacts, dentures, or bridgework may not be worn into surgery. Leave suitcase in car.  After surgery it may be brought to your room.    For patients admitted to the hospital, checkout time is 11:00 AM the day of discharge.

## 2017-11-04 ENCOUNTER — Other Ambulatory Visit: Payer: Self-pay

## 2017-11-04 ENCOUNTER — Encounter (HOSPITAL_COMMUNITY): Payer: Self-pay

## 2017-11-04 ENCOUNTER — Encounter (HOSPITAL_COMMUNITY)
Admission: RE | Admit: 2017-11-04 | Discharge: 2017-11-04 | Disposition: A | Discharge status: Home / Self Care | Payer: BLUE CROSS/BLUE SHIELD | Source: Ambulatory Visit | Attending: Obstetrics and Gynecology | Admitting: Obstetrics and Gynecology

## 2017-11-04 DIAGNOSIS — Z01812 Encounter for preprocedural laboratory examination: Secondary | ICD-10-CM | POA: Diagnosis present

## 2017-11-04 HISTORY — DX: Headache, unspecified: R51.9

## 2017-11-04 HISTORY — DX: Hypothyroidism, unspecified: E03.9

## 2017-11-04 HISTORY — DX: Personal history of urinary calculi: Z87.442

## 2017-11-04 HISTORY — DX: Major depressive disorder, single episode, unspecified: F32.9

## 2017-11-04 HISTORY — DX: Depression, unspecified: F32.A

## 2017-11-04 HISTORY — DX: Presence of cardiac pacemaker: Z95.0

## 2017-11-04 HISTORY — DX: Headache: R51

## 2017-11-04 LAB — BASIC METABOLIC PANEL
Anion gap: 9 (ref 5–15)
BUN: 12 mg/dL (ref 6–20)
CO2: 22 mmol/L (ref 22–32)
Calcium: 9.1 mg/dL (ref 8.9–10.3)
Chloride: 105 mmol/L (ref 101–111)
Creatinine, Ser: 0.64 mg/dL (ref 0.44–1.00)
GFR calc Af Amer: >60 mL/min (ref >60)
GFR calc non Af Amer: >60 mL/min (ref >60)
Glucose, Bld: 91 mg/dL (ref 65–99)
Potassium: 3.7 mmol/L (ref 3.5–5.1)
Sodium: 136 mmol/L (ref 135–145)

## 2017-11-04 LAB — CBC
HCT: 40.7 % (ref 36.0–46.0)
Hemoglobin: 14.1 g/dL (ref 12.0–15.0)
MCH: 31.1 pg (ref 26.0–34.0)
MCHC: 34.6 g/dL (ref 30.0–36.0)
MCV: 89.8 fL (ref 78.0–100.0)
Platelets: 242 10*3/uL (ref 150–400)
RBC: 4.53 MIL/uL (ref 3.87–5.11)
RDW: 13.1 % (ref 11.5–15.5)
WBC: 4.8 10*3/uL (ref 4.0–10.5)

## 2017-11-05 ENCOUNTER — Other Ambulatory Visit (HOSPITAL_COMMUNITY): Payer: Self-pay | Admitting: Obstetrics and Gynecology

## 2017-11-05 NOTE — H&P (Signed)
Carmen [name] is a 37 y.o.  female, 8P: 1-0-2-1 who presents for hysterectomy , placement of tension free vaginal tape,  and anterior-posterior colporrhaphy because of urinary incontinence and pelvic relaxation.  For many years the patient has noticed increasing difficulty with the evacuation of her stool, such that,  she now has to apply perineal pressure to achieve. During intercourse she feels a "blockage" as if something within her vagina is hindering penetration and has not been able to retain a tampon during her menstrual period.  Additionally she has urinary incontinence with cough or sneeze, urinary urgency and post void dribbling. She admits to positional dyspareunia but only spots during her menstrual period because she has a Mirena IUD.   This spotting, in recent months occurs 20 days out of the month and is accompanied by left sided cramping rated 4/10 on a 10 point pain scale.  The patient was considering pelvic floor physical therapy for her symptoms however,  since she has a pacemaker she is not a candidate. A Lumax urodynamic study showed stress urinary incontinence with intrinsic sphincter dysfunction with some components of overactive bladder.  In June 2018 a pelvic ultrasound showed an anteverted uterus: 6.3 x 3.88 x 5.31 cm (fundus to external os-8.8 cm)  endometrium: 0.63 cm,  right ovary-2.85 cm and left ovary-2.89 cm.  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   Past Medical History  OB History: G: 3: P: 1-0-2-1;  SVB 2008,  6lbs. 9 oz.  GYN History: menarche: 37 YO;      LMP: see HPI     Contraception: Mirena IUD    Denies history of abnormal PAP smear or STDs.    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 Tape Hives  . Dilaudid [Hydromorphone Hcl] Itching    "I just about crawled out of my skin"  . Quinolones Nausea Only   Ultram, Percocet and Vicodin causes the patient to have insomnia and become hyperactive   Denies sensitivity to peanuts, shellfish, soy, latex or adhesives (but able to use skin glue).   ROS: Admits to glasses,  frequent migraines, seasonal allergies, urticaria but   denies  vision changes, nasal congestion, dysphagia, tinnitus, dizziness, hoarseness, cough,  chest pain, shortness of breath, nausea, vomiting, diarrhea,constipation,  urinary frequency, dysuria, hematuria, vaginitis symptoms, pelvic pain, swelling of joints,easy bruising,  myalgias, arthralgias, skin rashes, unexplained weight loss and except as is mentioned in the history of present illness, patient's review of systems is otherwise negative.  Physical Exam  Bp: 126/78   P: 91 bpm  Temperature: 98.8 degree F orally  Weight: 206 lbs.  Height:   BMI: 36.5  Neck: supple without masses or thyromegaly Lungs: clear to auscultation Heart: regular rate and  rhythm Abdomen: soft, non-tender and no organomegaly Pelvic:EGBUS- wnl; vagina-normal rugae with relaxation; uterus-normal size, cervix without lesions or motion tenderness; IUD strings visible; adnexae-no tenderness or masses Extremities:  no clubbing, cyanosis or edema   Assesment: Female Stress Incontinence                      Pelvic Prolapse   Disposition:  A discussion was held with patient regarding the indication for her procedure(s) along with the risks, which include but are not limited  reaction to anesthesia, damage to adjacent organs, infection, urinary retention, erosion of tension free vaginal tape and excessive bleeding.  The patient verbalized understanding of these risks and has consented to proceed with a Total Vaginal Hysterectomy,  Bilateral Salpingectomy,  Posterior Colporrhaphy,  Possible Anterior Colporrhaphy,  Placement of Tension Free Vaginal Tape and Cystoscopy at [facility] on Nov 14, 2017.  CSN# [id number]   Jamire Shabazz J. Lowell Guitar, PA-C  for Dr  Dois Davenport A. Rivard

## 2017-11-14 ENCOUNTER — Observation Stay (HOSPITAL_COMMUNITY)
Admission: RE | Admit: 2017-11-14 | Discharge: 2017-11-17 | Disposition: A | Discharge status: Home / Self Care | Payer: BLUE CROSS/BLUE SHIELD | Source: Ambulatory Visit | Attending: Obstetrics and Gynecology | Admitting: Obstetrics and Gynecology

## 2017-11-14 ENCOUNTER — Encounter (HOSPITAL_COMMUNITY)
Admission: RE | Disposition: A | Discharge status: Home / Self Care | Payer: Self-pay | Source: Ambulatory Visit | Attending: Obstetrics and Gynecology

## 2017-11-14 ENCOUNTER — Encounter (HOSPITAL_COMMUNITY): Payer: Self-pay | Admitting: Anesthesiology

## 2017-11-14 ENCOUNTER — Ambulatory Visit (HOSPITAL_COMMUNITY): Payer: BLUE CROSS/BLUE SHIELD | Admitting: Anesthesiology

## 2017-11-14 ENCOUNTER — Other Ambulatory Visit: Payer: Self-pay

## 2017-11-14 DIAGNOSIS — N3946 Mixed incontinence: Secondary | ICD-10-CM | POA: Diagnosis not present

## 2017-11-14 DIAGNOSIS — F419 Anxiety disorder, unspecified: Secondary | ICD-10-CM | POA: Insufficient documentation

## 2017-11-14 DIAGNOSIS — N800 Endometriosis of uterus: Secondary | ICD-10-CM | POA: Diagnosis not present

## 2017-11-14 DIAGNOSIS — N819 Female genital prolapse, unspecified: Secondary | ICD-10-CM

## 2017-11-14 DIAGNOSIS — N8110 Cystocele, unspecified: Secondary | ICD-10-CM | POA: Insufficient documentation

## 2017-11-14 DIAGNOSIS — E669 Obesity, unspecified: Secondary | ICD-10-CM | POA: Diagnosis not present

## 2017-11-14 DIAGNOSIS — I10 Essential (primary) hypertension: Secondary | ICD-10-CM | POA: Diagnosis not present

## 2017-11-14 DIAGNOSIS — E785 Hyperlipidemia, unspecified: Secondary | ICD-10-CM | POA: Diagnosis not present

## 2017-11-14 DIAGNOSIS — Z7982 Long term (current) use of aspirin: Secondary | ICD-10-CM | POA: Insufficient documentation

## 2017-11-14 DIAGNOSIS — Z79899 Other long term (current) drug therapy: Secondary | ICD-10-CM | POA: Diagnosis not present

## 2017-11-14 DIAGNOSIS — I471 Supraventricular tachycardia: Secondary | ICD-10-CM

## 2017-11-14 DIAGNOSIS — F329 Major depressive disorder, single episode, unspecified: Secondary | ICD-10-CM | POA: Insufficient documentation

## 2017-11-14 DIAGNOSIS — Z6836 Body mass index (BMI) 36.0-36.9, adult: Secondary | ICD-10-CM | POA: Diagnosis not present

## 2017-11-14 DIAGNOSIS — N816 Rectocele: Secondary | ICD-10-CM | POA: Insufficient documentation

## 2017-11-14 DIAGNOSIS — R32 Unspecified urinary incontinence: Secondary | ICD-10-CM | POA: Diagnosis present

## 2017-11-14 DIAGNOSIS — N8189 Other female genital prolapse: Secondary | ICD-10-CM | POA: Diagnosis present

## 2017-11-14 DIAGNOSIS — E039 Hypothyroidism, unspecified: Secondary | ICD-10-CM | POA: Insufficient documentation

## 2017-11-14 HISTORY — PX: BLADDER SUSPENSION: SHX72

## 2017-11-14 HISTORY — PX: CYSTOSCOPY: SHX5120

## 2017-11-14 HISTORY — PX: VAGINAL HYSTERECTOMY: SHX2639

## 2017-11-14 HISTORY — PX: ANTERIOR AND POSTERIOR REPAIR: SHX5121

## 2017-11-14 HISTORY — PX: BILATERAL SALPINGECTOMY: SHX5743

## 2017-11-14 LAB — TYPE AND SCREEN
ABO/RH(D): AB POS
Antibody Screen: NEGATIVE

## 2017-11-14 LAB — BASIC METABOLIC PANEL
Anion gap: 11 (ref 5–15)
BUN: 17 mg/dL (ref 6–20)
CO2: 23 mmol/L (ref 22–32)
Calcium: 8.6 mg/dL — ABNORMAL LOW (ref 8.9–10.3)
Chloride: 102 mmol/L (ref 101–111)
Creatinine, Ser: 0.71 mg/dL (ref 0.44–1.00)
GFR calc Af Amer: >60 mL/min (ref >60)
GFR calc non Af Amer: >60 mL/min (ref >60)
Glucose, Bld: 77 mg/dL (ref 65–99)
Potassium: 3.4 mmol/L — ABNORMAL LOW (ref 3.5–5.1)
Sodium: 136 mmol/L (ref 135–145)

## 2017-11-14 LAB — CBC
HCT: 41.1 % (ref 36.0–46.0)
Hemoglobin: 14.2 g/dL (ref 12.0–15.0)
MCH: 31.9 pg (ref 26.0–34.0)
MCHC: 34.5 g/dL (ref 30.0–36.0)
MCV: 92.4 fL (ref 78.0–100.0)
Platelets: 212 10*3/uL (ref 150–400)
RBC: 4.45 MIL/uL (ref 3.87–5.11)
RDW: 13 % (ref 11.5–15.5)
WBC: 6.8 10*3/uL (ref 4.0–10.5)

## 2017-11-14 LAB — PREGNANCY, URINE: Preg Test, Ur: NEGATIVE

## 2017-11-14 SURGERY — URETHROPEXY, USING TRANSVAGINAL TAPE
Anesthesia: General | Site: Vagina

## 2017-11-14 MED ORDER — DOCUSATE SODIUM 100 MG PO CAPS
100.0000 mg | ORAL_CAPSULE | Freq: Two times a day (BID) | ORAL | Status: DC
  Administered 2017-11-14 – 2017-11-17 (×6): 100 mg via ORAL
  Filled 2017-11-14 (×6): qty 1

## 2017-11-14 MED ORDER — PANTOPRAZOLE SODIUM 40 MG PO TBEC
40.0000 mg | DELAYED_RELEASE_TABLET | Freq: Every day | ORAL | Status: DC
  Administered 2017-11-15 – 2017-11-17 (×3): 40 mg via ORAL
  Filled 2017-11-14 (×3): qty 1

## 2017-11-14 MED ORDER — FENTANYL CITRATE (PF) 100 MCG/2ML IJ SOLN
25.0000 ug | INTRAMUSCULAR | Status: DC | PRN
  Administered 2017-11-14: 50 ug via INTRAVENOUS

## 2017-11-14 MED ORDER — LACTATED RINGERS IV SOLN
INTRAVENOUS | Status: DC
  Administered 2017-11-14 (×3): via INTRAVENOUS

## 2017-11-14 MED ORDER — GABAPENTIN 300 MG PO CAPS
ORAL_CAPSULE | ORAL | Status: AC
  Filled 2017-11-14: qty 1

## 2017-11-14 MED ORDER — LIDOCAINE HCL (CARDIAC) PF 100 MG/5ML IV SOSY
PREFILLED_SYRINGE | INTRAVENOUS | Status: AC
  Filled 2017-11-14: qty 5

## 2017-11-14 MED ORDER — PHENYLEPHRINE 40 MCG/ML (10ML) SYRINGE FOR IV PUSH (FOR BLOOD PRESSURE SUPPORT)
PREFILLED_SYRINGE | INTRAVENOUS | Status: AC
  Filled 2017-11-14: qty 10

## 2017-11-14 MED ORDER — ONDANSETRON HCL 4 MG PO TABS: 4.0000 mg | ORAL_TABLET | Freq: Three times a day (TID) | ORAL | Status: DC | PRN

## 2017-11-14 MED ORDER — STERILE WATER FOR IRRIGATION IR SOLN
Status: DC | PRN
  Administered 2017-11-14: 1000 mL via INTRAVESICAL

## 2017-11-14 MED ORDER — FENTANYL CITRATE (PF) 100 MCG/2ML IJ SOLN
INTRAMUSCULAR | Status: AC
  Filled 2017-11-14: qty 2

## 2017-11-14 MED ORDER — LACTATED RINGERS IV SOLN: INTRAVENOUS | Status: DC

## 2017-11-14 MED ORDER — FENTANYL CITRATE (PF) 100 MCG/2ML IJ SOLN
25.0000 ug | INTRAMUSCULAR | Status: DC | PRN
  Administered 2017-11-14 – 2017-11-15 (×4): 25 ug via INTRAVENOUS
  Filled 2017-11-14 (×4): qty 2

## 2017-11-14 MED ORDER — LIDOCAINE-EPINEPHRINE (PF) 1 %-1:200000 IJ SOLN
INTRAMUSCULAR | Status: DC | PRN
  Administered 2017-11-14: 17 mL

## 2017-11-14 MED ORDER — CEFOTETAN DISODIUM 2 G IJ SOLR
INTRAMUSCULAR | Status: AC
  Filled 2017-11-14: qty 2

## 2017-11-14 MED ORDER — FENTANYL CITRATE (PF) 250 MCG/5ML IJ SOLN
INTRAMUSCULAR | Status: AC
  Filled 2017-11-14: qty 5

## 2017-11-14 MED ORDER — DIPHENHYDRAMINE HCL 50 MG/ML IJ SOLN
INTRAMUSCULAR | Status: AC
  Filled 2017-11-14: qty 1

## 2017-11-14 MED ORDER — IBUPROFEN 600 MG PO TABS: 600.0000 mg | ORAL_TABLET | Freq: Four times a day (QID) | ORAL | Status: DC

## 2017-11-14 MED ORDER — ESMOLOL HCL 100 MG/10ML IV SOLN
INTRAVENOUS | Status: AC
  Filled 2017-11-14: qty 10

## 2017-11-14 MED ORDER — SCOPOLAMINE 1 MG/3DAYS TD PT72
1.0000 | MEDICATED_PATCH | Freq: Once | TRANSDERMAL | Status: DC
  Administered 2017-11-14: 1.5 mg via TRANSDERMAL

## 2017-11-14 MED ORDER — FENTANYL CITRATE (PF) 250 MCG/5ML IJ SOLN
INTRAMUSCULAR | Status: AC
Start: 2017-11-14 — End: ?
  Filled 2017-11-14: qty 5

## 2017-11-14 MED ORDER — LEVOTHYROXINE SODIUM 50 MCG PO TABS
50.0000 ug | ORAL_TABLET | Freq: Every day | ORAL | Status: DC
  Administered 2017-11-15 – 2017-11-17 (×3): 50 ug via ORAL
  Filled 2017-11-14 (×4): qty 1

## 2017-11-14 MED ORDER — ZOLPIDEM TARTRATE 5 MG PO TABS
5.0000 mg | ORAL_TABLET | Freq: Every evening | ORAL | Status: DC | PRN
  Administered 2017-11-14: 5 mg via ORAL
  Filled 2017-11-14: qty 1

## 2017-11-14 MED ORDER — KETOROLAC TROMETHAMINE 30 MG/ML IJ SOLN
30.0000 mg | Freq: Four times a day (QID) | INTRAMUSCULAR | Status: DC
  Administered 2017-11-14: 30 mg via INTRAVENOUS
  Filled 2017-11-14: qty 1

## 2017-11-14 MED ORDER — ESTRADIOL 0.1 MG/GM VA CREA
TOPICAL_CREAM | VAGINAL | Status: AC
  Filled 2017-11-14: qty 42.5

## 2017-11-14 MED ORDER — CEFOTETAN DISODIUM-DEXTROSE 2-2.08 GM-%(50ML) IV SOLR
2.0000 g | INTRAVENOUS | Status: AC
  Administered 2017-11-14: 2 g via INTRAVENOUS

## 2017-11-14 MED ORDER — MENTHOL 3 MG MT LOZG: 1.0000 | LOZENGE | OROMUCOSAL | Status: DC | PRN

## 2017-11-14 MED ORDER — GLYCOPYRROLATE 0.2 MG/ML IJ SOLN
INTRAMUSCULAR | Status: DC | PRN
  Administered 2017-11-14: 0.1 mg via INTRAVENOUS

## 2017-11-14 MED ORDER — LIDOCAINE-EPINEPHRINE (PF) 1 %-1:200000 IJ SOLN
INTRAMUSCULAR | Status: AC
  Filled 2017-11-14: qty 30

## 2017-11-14 MED ORDER — DEXAMETHASONE SODIUM PHOSPHATE 10 MG/ML IJ SOLN
INTRAMUSCULAR | Status: AC
  Filled 2017-11-14: qty 1

## 2017-11-14 MED ORDER — KETOROLAC TROMETHAMINE 30 MG/ML IJ SOLN
INTRAMUSCULAR | Status: AC
  Filled 2017-11-14: qty 2

## 2017-11-14 MED ORDER — PROPOFOL 10 MG/ML IV BOLUS
INTRAVENOUS | Status: AC
  Filled 2017-11-14: qty 20

## 2017-11-14 MED ORDER — PROPOFOL 10 MG/ML IV BOLUS
INTRAVENOUS | Status: DC | PRN
  Administered 2017-11-14: 200 mg via INTRAVENOUS

## 2017-11-14 MED ORDER — LISDEXAMFETAMINE DIMESYLATE 20 MG PO CAPS
40.0000 mg | ORAL_CAPSULE | ORAL | Status: DC
Start: 2017-11-15 — End: 2017-11-14
  Filled 2017-11-14: qty 2

## 2017-11-14 MED ORDER — DEXAMETHASONE SODIUM PHOSPHATE 10 MG/ML IJ SOLN
INTRAMUSCULAR | Status: DC | PRN
  Administered 2017-11-14 (×2): 5 mg via INTRAVENOUS

## 2017-11-14 MED ORDER — ONDANSETRON HCL 4 MG/2ML IJ SOLN
INTRAMUSCULAR | Status: AC
  Filled 2017-11-14: qty 2

## 2017-11-14 MED ORDER — VASOPRESSIN 20 UNIT/ML IV SOLN
INTRAVENOUS | Status: AC
  Filled 2017-11-14: qty 2

## 2017-11-14 MED ORDER — ONDANSETRON HCL 4 MG/2ML IJ SOLN
INTRAMUSCULAR | Status: DC | PRN
  Administered 2017-11-14: 4 mg via INTRAVENOUS

## 2017-11-14 MED ORDER — ROCURONIUM BROMIDE 100 MG/10ML IV SOLN
INTRAVENOUS | Status: DC | PRN
  Administered 2017-11-14: 2 mg via INTRAVENOUS
  Administered 2017-11-14: 50 mg via INTRAVENOUS
  Administered 2017-11-14: 10 mg via INTRAVENOUS
  Administered 2017-11-14: 5 mg via INTRAVENOUS
  Administered 2017-11-14: 10 mg via INTRAVENOUS
  Administered 2017-11-14: 5 mg via INTRAVENOUS

## 2017-11-14 MED ORDER — OXCARBAZEPINE 300 MG PO TABS
300.0000 mg | ORAL_TABLET | Freq: Two times a day (BID) | ORAL | Status: DC
  Administered 2017-11-14 – 2017-11-17 (×6): 300 mg via ORAL
  Filled 2017-11-14 (×8): qty 1

## 2017-11-14 MED ORDER — GABAPENTIN 300 MG PO CAPS
300.0000 mg | ORAL_CAPSULE | Freq: Once | ORAL | Status: AC
  Administered 2017-11-14: 300 mg via ORAL

## 2017-11-14 MED ORDER — ACETAMINOPHEN 500 MG PO TABS
ORAL_TABLET | ORAL | Status: AC
  Filled 2017-11-14: qty 2

## 2017-11-14 MED ORDER — MIDAZOLAM HCL 2 MG/2ML IJ SOLN
INTRAMUSCULAR | Status: DC | PRN
  Administered 2017-11-14: 1 mg via INTRAVENOUS

## 2017-11-14 MED ORDER — IBUPROFEN 600 MG PO TABS
600.0000 mg | ORAL_TABLET | Freq: Four times a day (QID) | ORAL | Status: DC | PRN
  Administered 2017-11-15: 600 mg via ORAL
  Filled 2017-11-14 (×2): qty 1

## 2017-11-14 MED ORDER — SODIUM CHLORIDE 0.9 % IJ SOLN
INTRAMUSCULAR | Status: AC
  Filled 2017-11-14: qty 10

## 2017-11-14 MED ORDER — SUGAMMADEX SODIUM 200 MG/2ML IV SOLN
INTRAVENOUS | Status: DC | PRN
  Administered 2017-11-14: 200 mg via INTRAVENOUS

## 2017-11-14 MED ORDER — SUGAMMADEX SODIUM 200 MG/2ML IV SOLN
INTRAVENOUS | Status: AC
  Filled 2017-11-14: qty 2

## 2017-11-14 MED ORDER — METOPROLOL SUCCINATE ER 100 MG PO TB24
100.0000 mg | ORAL_TABLET | Freq: Every day | ORAL | Status: DC
  Administered 2017-11-15 – 2017-11-17 (×3): 100 mg via ORAL
  Filled 2017-11-14 (×4): qty 1

## 2017-11-14 MED ORDER — GLYCOPYRROLATE 0.2 MG/ML IJ SOLN
INTRAMUSCULAR | Status: AC
  Filled 2017-11-14: qty 1

## 2017-11-14 MED ORDER — ONDANSETRON HCL 4 MG/2ML IJ SOLN: 4.0000 mg | Freq: Three times a day (TID) | INTRAMUSCULAR | Status: DC | PRN

## 2017-11-14 MED ORDER — VENLAFAXINE HCL ER 150 MG PO CP24
150.0000 mg | ORAL_CAPSULE | Freq: Every day | ORAL | Status: DC
  Administered 2017-11-15 – 2017-11-17 (×3): 150 mg via ORAL
  Filled 2017-11-14 (×4): qty 1

## 2017-11-14 MED ORDER — CLONAZEPAM 0.5 MG PO TABS
1.0000 mg | ORAL_TABLET | Freq: Two times a day (BID) | ORAL | Status: DC | PRN
  Administered 2017-11-15: 1 mg via ORAL
  Filled 2017-11-14: qty 2

## 2017-11-14 MED ORDER — VASOPRESSIN 20 UNIT/ML IV SOLN
INTRAVENOUS | Status: DC | PRN
  Administered 2017-11-14: 5 mL via INTRAMUSCULAR

## 2017-11-14 MED ORDER — SODIUM CHLORIDE 0.9 % IJ SOLN
INTRAMUSCULAR | Status: AC
  Filled 2017-11-14: qty 150

## 2017-11-14 MED ORDER — FENTANYL CITRATE (PF) 100 MCG/2ML IJ SOLN
INTRAMUSCULAR | Status: DC | PRN
  Administered 2017-11-14 (×6): 50 ug via INTRAVENOUS
  Administered 2017-11-14: 100 ug via INTRAVENOUS

## 2017-11-14 MED ORDER — KETOROLAC TROMETHAMINE 30 MG/ML IJ SOLN
INTRAMUSCULAR | Status: DC | PRN
  Administered 2017-11-14 (×2): 30 mg via INTRAVENOUS

## 2017-11-14 MED ORDER — MIDAZOLAM HCL 2 MG/2ML IJ SOLN
INTRAMUSCULAR | Status: AC
  Filled 2017-11-14: qty 2

## 2017-11-14 MED ORDER — SCOPOLAMINE 1 MG/3DAYS TD PT72
MEDICATED_PATCH | TRANSDERMAL | Status: AC
  Filled 2017-11-14: qty 1

## 2017-11-14 MED ORDER — VASOPRESSIN 20 UNIT/ML IV SOLN
INTRAVENOUS | Status: DC | PRN
  Administered 2017-11-14: 20 mL via INTRAMUSCULAR

## 2017-11-14 MED ORDER — ESTRADIOL 0.1 MG/GM VA CREA
TOPICAL_CREAM | VAGINAL | Status: DC | PRN
  Administered 2017-11-14: 1 via VAGINAL

## 2017-11-14 MED ORDER — ROCURONIUM BROMIDE 100 MG/10ML IV SOLN
INTRAVENOUS | Status: AC
  Filled 2017-11-14: qty 1

## 2017-11-14 MED ORDER — PROMETHAZINE HCL 25 MG/ML IJ SOLN: 6.2500 mg | INTRAMUSCULAR | Status: DC | PRN

## 2017-11-14 MED ORDER — LIDOCAINE HCL (CARDIAC) PF 100 MG/5ML IV SOSY
PREFILLED_SYRINGE | INTRAVENOUS | Status: DC | PRN
  Administered 2017-11-14: 100 mg via INTRAVENOUS

## 2017-11-14 MED ORDER — ACETAMINOPHEN 500 MG PO TABS
1000.0000 mg | ORAL_TABLET | Freq: Once | ORAL | Status: AC
  Administered 2017-11-14: 1000 mg via ORAL

## 2017-11-14 MED ORDER — OXYCODONE-ACETAMINOPHEN 5-325 MG PO TABS
1.0000 | ORAL_TABLET | Freq: Four times a day (QID) | ORAL | Status: DC | PRN
  Administered 2017-11-14 – 2017-11-16 (×5): 2 via ORAL
  Administered 2017-11-16: 1 via ORAL
  Administered 2017-11-16: 2 via ORAL
  Administered 2017-11-16 – 2017-11-17 (×2): 1 via ORAL
  Filled 2017-11-14: qty 2
  Filled 2017-11-14: qty 1
  Filled 2017-11-14: qty 2
  Filled 2017-11-14 (×2): qty 1
  Filled 2017-11-14 (×4): qty 2

## 2017-11-14 MED ORDER — SUMATRIPTAN SUCCINATE 100 MG PO TABS
100.0000 mg | ORAL_TABLET | ORAL | Status: DC | PRN
  Filled 2017-11-14: qty 1

## 2017-11-14 SURGICAL SUPPLY — 56 items
ADH SKN CLS APL DERMABOND .7 (GAUZE/BANDAGES/DRESSINGS) ×2
BLADE SURG 11 STRL SS (BLADE) ×4 IMPLANT
BLADE SURG 15 STRL LF C SS BP (BLADE) ×2 IMPLANT
BLADE SURG 15 STRL SS (BLADE) ×4
CANISTER SUCT 3000ML PPV (MISCELLANEOUS) ×8 IMPLANT
CATH FOLEY 2WAY SLVR  5CC 18FR (CATHETERS) ×2
CATH FOLEY 2WAY SLVR 5CC 18FR (CATHETERS) ×2 IMPLANT
CATH ROBINSON RED A/P 14FR (CATHETERS) ×2 IMPLANT
CONT PATH 16OZ SNAP LID 3702 (MISCELLANEOUS) IMPLANT
DECANTER SPIKE VIAL GLASS SM (MISCELLANEOUS) ×4 IMPLANT
DERMABOND ADVANCED (GAUZE/BANDAGES/DRESSINGS) ×2
DERMABOND ADVANCED .7 DNX12 (GAUZE/BANDAGES/DRESSINGS) ×2 IMPLANT
DRAPE SHEET LG 3/4 BI-LAMINATE (DRAPES) ×16 IMPLANT
DRAPE STERI URO 9X17 APER PCH (DRAPES) ×4 IMPLANT
GAUZE PACKING 1 X5 YD ST (GAUZE/BANDAGES/DRESSINGS) ×4 IMPLANT
GAUZE PACKING 2X5 YD STRL (GAUZE/BANDAGES/DRESSINGS) IMPLANT
GAUZE SPONGE 4X4 16PLY XRAY LF (GAUZE/BANDAGES/DRESSINGS) ×6 IMPLANT
GLOVE BIO SURGEON STRL SZ7.5 (GLOVE) ×4 IMPLANT
GLOVE BIOGEL PI IND STRL 6.5 (GLOVE) IMPLANT
GLOVE BIOGEL PI IND STRL 7.0 (GLOVE) ×6 IMPLANT
GLOVE BIOGEL PI IND STRL 7.5 (GLOVE) ×2 IMPLANT
GLOVE BIOGEL PI INDICATOR 6.5 (GLOVE) ×4
GLOVE BIOGEL PI INDICATOR 7.0 (GLOVE) ×6
GLOVE BIOGEL PI INDICATOR 7.5 (GLOVE) ×2
GLOVE ECLIPSE 6.5 STRL STRAW (GLOVE) ×6 IMPLANT
GLOVE SURG SS PI 7.0 STRL IVOR (GLOVE) ×10 IMPLANT
GOWN STRL REUS W/TWL LRG LVL3 (GOWN DISPOSABLE) ×24 IMPLANT
NDL SPNL 20GX3.5 QUINCKE YW (NEEDLE) ×2 IMPLANT
NEEDLE HYPO 22GX1.5 SAFETY (NEEDLE) ×4 IMPLANT
NEEDLE SPNL 20GX3.5 QUINCKE YW (NEEDLE) ×4 IMPLANT
NS IRRIG 1000ML POUR BTL (IV SOLUTION) ×8 IMPLANT
PACK VAGINAL WOMENS (CUSTOM PROCEDURE TRAY) ×6 IMPLANT
PAD OB MATERNITY 4.3X12.25 (PERSONAL CARE ITEMS) ×4 IMPLANT
SET CYSTO W/LG BORE CLAMP LF (SET/KITS/TRAYS/PACK) ×4 IMPLANT
SLING TRANS VAGINAL TAPE (Sling) ×2 IMPLANT
SLING UTERINE/ABD GYNECARE TVT (Sling) ×4 IMPLANT
SUT MNCRL AB 3-0 PS2 27 (SUTURE) IMPLANT
SUT MNCRL AB 4-0 PS2 18 (SUTURE) IMPLANT
SUT VIC AB 0 CT1 18XCR BRD8 (SUTURE) ×6 IMPLANT
SUT VIC AB 0 CT1 27 (SUTURE) ×16
SUT VIC AB 0 CT1 27XBRD ANBCTR (SUTURE) ×6 IMPLANT
SUT VIC AB 0 CT1 36 (SUTURE) ×8 IMPLANT
SUT VIC AB 0 CT1 8-18 (SUTURE) ×12
SUT VIC AB 2-0 CT1 27 (SUTURE)
SUT VIC AB 2-0 CT1 TAPERPNT 27 (SUTURE) IMPLANT
SUT VIC AB 2-0 CT2 27 (SUTURE) IMPLANT
SUT VIC AB 2-0 SH 27 (SUTURE) ×36
SUT VIC AB 2-0 SH 27XBRD (SUTURE) ×24 IMPLANT
SUT VIC AB 3-0 SH 27 (SUTURE) ×8
SUT VIC AB 3-0 SH 27X BRD (SUTURE) ×4 IMPLANT
SUT VICRYL 0 TIES 12 18 (SUTURE) ×4 IMPLANT
SYR TB 1ML 25GX5/8 (SYRINGE) ×4 IMPLANT
TOWEL OR 17X24 6PK STRL BLUE (TOWEL DISPOSABLE) ×16 IMPLANT
TRAY FOL W/BAG SLVR 16FR STRL (SET/KITS/TRAYS/PACK) IMPLANT
TRAY FOLEY W/BAG SLVR 14FR (SET/KITS/TRAYS/PACK) ×8 IMPLANT
TRAY FOLEY W/BAG SLVR 16FR LF (SET/KITS/TRAYS/PACK) ×4

## 2017-11-14 NOTE — Transfer of Care (Signed)
Immediate Anesthesia Transfer of Care Note  Patient: Carmen [name]  Procedure(s) Performed: TRANSVAGINAL TAPE (TVT) PROCEDURE (Bilateral Vagina ) CYSTOSCOPY (N/A Vagina ) HYSTERECTOMY VAGINAL (N/A Vagina ) BILATERAL SALPINGECTOMY (Bilateral Vagina ) ANTERIOR (CYSTOCELE) AND POSTERIOR REPAIR (RECTOCELE) (N/A Vagina )  Patient Location: PACU  Anesthesia Type:General  Level of Consciousness: awake, alert  and oriented  Airway & Oxygen Therapy: Patient Spontanous Breathing and Patient connected to nasal cannula oxygen  Post-op Assessment: Report given to RN and Post -op Vital signs reviewed and stable  Post vital signs: Reviewed and stable  Last Vitals:  Vitals Value Taken Time  BP 135/83 11/14/2017  1:15 PM  Temp    Pulse 81 11/14/2017  1:17 PM  Resp 18 11/14/2017  1:17 PM  SpO2 95 % 11/14/2017  1:17 PM  Vitals shown include unvalidated device data.  Last Pain:  Vitals:   11/14/17 0740  TempSrc: Oral      Patients Stated Pain Goal: 3 (11/14/17 0740)  Complications: No apparent anesthesia complications

## 2017-11-14 NOTE — Discharge Instructions (Signed)
Call [facility] @ [phone] if: ° °You have a temperature greater than or equal to 100.4 degrees Farenheit orally °You have pain that is not made better by the pain medication given and taken as directed °You have excessive bleeding or problems urinating ° °Take Colace (Docusate Sodium/Stool Softener) 100 mg 2-3 times daily while taking narcotic pain medicine to avoid constipation or until bowel movements are regular. °Take Ibuprofen 600 mg  with food every 6 hours for 5 days then as needed for pain ° °You may drive after 2 weeks °You may walk up steps ° °You may shower  °You may resume a regular diet °Keep incisions clean and dry ° °Do not lift over 15 pounds for 6 weeks °Avoid anything in vagina for 6 weeks (or until after your post-operative visit) ° °

## 2017-11-14 NOTE — Progress Notes (Signed)
Contacted provider in regards to patients pain. Patient complains of pain in the bottom area and cramping at incision area. Doctor suggested a fentanyl PCA. Talked to patient about that idea and she said she would rather just have fentanyl in her IV every few hours and an Ambien for sleep.

## 2017-11-14 NOTE — Anesthesia Procedure Notes (Signed)
Procedure Name: Intubation Date/Time: 11/14/2017 9:08 AM Performed by: Flossie Dibble, CRNA Pre-anesthesia Checklist: Patient identified, Patient being monitored, Timeout performed, Emergency Drugs available and Suction available Patient Re-evaluated:Patient Re-evaluated prior to induction Oxygen Delivery Method: Circle System Utilized Preoxygenation: Pre-oxygenation with 100% oxygen Induction Type: IV induction Ventilation: Mask ventilation without difficulty and Oral airway inserted - appropriate to patient size Laryngoscope Size: Mac and 3 Grade View: Grade II Tube type: Oral Tube size: 7.0 mm Number of attempts: 1 Airway Equipment and Method: stylet Placement Confirmation: ETT inserted through vocal cords under direct vision,  positive ETCO2 and breath sounds checked- equal and bilateral Secured at: 21 cm Tube secured with: Tape Dental Injury: Teeth and Oropharynx as per pre-operative assessment  Difficulty Due To: Difficult Airway- due to anterior larynx

## 2017-11-14 NOTE — Anesthesia Postprocedure Evaluation (Signed)
Anesthesia Post Note  Patient: Tejasvi A Peche  Procedure(s) Performed: TRANSVAGINAL TAPE (TVT) PROCEDURE (Bilateral Vagina ) CYSTOSCOPY (N/A Vagina ) HYSTERECTOMY VAGINAL (N/A Vagina ) BILATERAL SALPINGECTOMY (Bilateral Vagina ) ANTERIOR (CYSTOCELE) AND POSTERIOR REPAIR (RECTOCELE) (N/A Vagina )     Patient location during evaluation: PACU Anesthesia Type: General Level of consciousness: awake and alert Pain management: pain level controlled Vital Signs Assessment: post-procedure vital signs reviewed and stable Respiratory status: spontaneous breathing, nonlabored ventilation, respiratory function stable and patient connected to nasal cannula oxygen Cardiovascular status: blood pressure returned to baseline and stable Postop Assessment: no apparent nausea or vomiting Anesthetic complications: no    Last Vitals:  Vitals:   11/14/17 1415 11/14/17 1433  BP: (!) 136/91 (!) 145/82  Pulse: 94 70  Resp: (!) 23 20  Temp:  36.6 C  SpO2: 97% 96%    Last Pain:  Vitals:   11/14/17 1433  TempSrc:   PainSc: 9    Pain Goal: Patients Stated Pain Goal: 3 (11/14/17 0740)               Cecile Hearing

## 2017-11-14 NOTE — Op Note (Signed)
Preoperative diagnosis:pelvic prolapse with urinary stress incontinence  Postoperative diagnosis: Same   Anesthesia: General  Anesthesiologist: Dr Desmond Lope  Procedure: Total vaginal hysterectomy, bilateral salpyngectomy with anterior and posterior repair and perineoplasty   Surgeon: Dr. Dois Davenport Akshath Mccarey   Assistant: [name] P.A.-C.   Estimated blood loss: 250 cc   Procedure:   After being informed of the planned procedure with possible complications including but not limited to infection, bleeding, injury to other organs, need for laparotomy, expected hospitals they and recovery time, informed consent was obtained and patient was taken to or #4.   She was given general anesthesia with endotracheal intubation without any complication. She was placed in the lithotomy position prepped and draped in a sterile fashion and a Foley catheter was inserted in her bladder.   A weighted speculum is inserted in the vagina and the uterus was grasped with 1 Christella Hartigan forcep. We infiltrate the mucosa around the cervix using  Vasopressine 20 units/100 cc saline. We perform a circular incision around the cervix and then proceed with blunt and sharp dissection of the anterior and posterior vaginal mucosa. This identifies the posterior cul-de-sac which was sharply opened. The bladder was retracted upward . Both uterosacral ligaments are isolated on a Rogers forcep, sectioned, sutured with a transfixed suture of 0 Vicryl maintained for future suspension. The anterior cul-de-sac is then dissected up and the peritoneum is opened sharply. This allows for easy isolation of the vascular pedicle on each side using a Rogers forcep. The vascular pedicle is then sectioned and sutured with a transfixed suture of 0 Vicryl. We can then isolate part of the broad ligament on each side using a Rogers forceps. These pedicles are sectioned and sutured with a transfixed suture of 0 Vicryl. We now have easy access to the final pedicle  incorporating the round ligament, the utero-ovarian ligament and tube. This last pedicle is then clamped with Rogers forcep, sectioned and doubly sutured with a transfixed suture of 0 Vicryl and a free tie of 0 Vicryl. The uterus is removed.   Both ovaries are visualized and normal. We have  access to both tubes so we proceed with bilateral salpingectomy by isolating the mesosalpinx on a Rogers forceps bilaterally. Each tube is then sharply removed and the pedicle is sutured with a transfix suture of 0 Vicryl and secured with a free tie of 0 Vicryl.   We then systematically verified hemostasis on all pedicles. Hemostasis is completed with 2 figure-of-eight sutures of 0 Vicryl. Hemostasis is then confirmed adequate. We proceed with closure of the posterior cul-de-sac by placing a suture reuniting the 2 uterosacral ligaments as well as the posterior cul-de-sac with 0 Vicryl. We then placed 2 vaginal suspension sutures using 0 Vicryl which will you reunite the posterior vaginal mucosa, the posterior peritoneum, the corresponding uterosacral ligaments and the anterior peritoneum. The sutures are then tied which closes the peritoneum at the same time.   Proceeding with anterior repair: We isolate the anterior vaginal mucosa using Allis forceps and infiltrated using Vasopressine 20 units/50 cc saline. The vaginal mucosa is then undermined medially and sharply dissected all the way to 1 cm below the urethrovesical junction. We proceed with sharp and blunt dissection of the prevesical fascia until the cystocele was completely freed. We then correct the cystocele with multilayered U- stitches of 2-0 Vicryl until complete resolution. Excess of vaginal mucosa is excised. The anterior vaginal mucosa is then closed using figure-of-eight stitches of 3-0 Vicryl.   The vaginal cuff is  then closed transversely using figure-of-eight sutures of 0 Vicryl.   Proceeding with posterior repair: We grasp the posterior forceps on a  distance of 2.5 cm infiltrate the posterior vaginal mucosa using 1% lidocaine with epinephrine 1 in 200,000. We sharply dissect the posterior vaginal mucosa midline until 2 cm from the vaginal cuff. We then sharply and bluntly dissect this mucosa away from the perirectal fascia until the rectocele was completely mobilized. We then correct the rectocele will multilayered U-stitches of 2-0 Vicryl until complete resolution. The excess vaginal mucosa is excised and the posterior vaginal mucosa is closed with a running lock suture of 3-0 Vicryl. The perineal muscles are then reapproximated with simple sutures of 3-0 Vicryl. The perineal skin is then closed with subcuticular suture of 3-0 Vicryl.   Instruments and sponge count is complete x2.  The procedure is well tolerated by the patient who is left to the care of Dr Su Hilt for placement of TVT (see separate op note)  Estimated blood loss is 250 cc   Specimen: Uterus with 2 tubes sent to pathology

## 2017-11-14 NOTE — Interval H&P Note (Signed)
History and Physical Interval Note:  11/14/2017 8:31 AM  Carmen [name]  has presented today for surgery, with the diagnosis of Mixed Urinary Incontience  The various methods of treatment have been discussed with the patient and family. After consideration of risks, benefits and other options for treatment, the patient has consented to  Procedure(s): TRANSVAGINAL TAPE (TVT) PROCEDURE (Bilateral) HYSTERECTOMY VAGINAL (N/A) BILATERAL SALPINGECTOMY (Bilateral) ANTERIOR (CYSTOCELE) AND POSTERIOR REPAIR (RECTOCELE) (N/A) as a surgical intervention .  The patient's history has been reviewed, patient examined, no change in status, stable for surgery.  I have reviewed the patient's chart and labs.  Questions were answered to the patient's satisfaction.     Dois Davenport A Valentine Kuechle

## 2017-11-14 NOTE — Op Note (Signed)
Preop Diagnosis: SUI  Postop Diagnosis: SUI  Procedure:1.TVT 2. Cystoscopy  Fluids: See flowsheet  UOP 75 cc  EBL 75 cc  Complications:none  Procedure:The patient was taken to the operating room after the risks, benefits and alternatives were discussed with patient, the patient verbalized understanding and consent signed and witnessed. The patient was placed under general anesthesia and prepped and draped in the normal sterile fashion in the dorsal lithotomy position. A weighted speculum was placed in the patient's vagina and the anterior vaginal wall was injected with dilute pitressin at a concentration of 20 units of pitressin in a total of 100cc of normal saline.  An incision was made in the anterior wall of the vagina for approximately 1cm beneath the midurethra and the underlying tissue was dissected away from the anterior vaginal wall down to the level of the lower symphysis pubis bilaterally. Attention was then turned to the mons pubis where two 5 mm incisions were made 2 fingerbreadths from the midline. The transabdominal guide was then passed through the mons pubis incision on the patient's right down through the space of Retzius and out through the anterior vaginal wall after deflecting the rigid urethral catheter guide to the ipsilateral side. The same was done on the contralateral side. Cystoscopy was performed and no invadvertant bladder injury was noted. The bladder was drained with a Foley while deflecting the rigid urethral catheter guide to the patient's right and the mesh was attached to the transabdominal guide and elevated up through the space of Retzius and out through the incision on the mons pubis on the ipsilateral side. The same was done on the contralateral side. Cystoscopy was performed again and no inadvertant bladder injury was noted. The 36 French Foley was left in the urethra and a large Tresa Endo was placed between the urethra and the mesh in order to leave the mesh slack  beneath the midurethra. The mesh was then cut flush with the skin at the mons pubis incisions bilaterally. Indigo carmine had been administered, cystoscopy was performed again and bilateral ureters were noted to efflux without difficulty. The bilateral incisions on the mons pubis were then cleaned and Dermabond applied. The anterior vaginal wall incision was repaired with 2-0 vicryl with interrupted stitches.  Vagina was packed with estrogen soaked vaginal packing.  Sponge, lap and needle count was correct.  The patient tolerated the procedure well and was returned to the recovery room in good condition.

## 2017-11-14 NOTE — Anesthesia Preprocedure Evaluation (Addendum)
Anesthesia Evaluation  Patient identified by MRN, date of birth, ID band Patient awake    Reviewed: Allergy & Precautions, NPO status , Patient's Chart, lab work & pertinent test results, reviewed documented beta blocker date and time   Airway Mallampati: II  TM Distance: >3 FB Neck ROM: Full    Dental  (+) Teeth Intact, Dental Advisory Given   Pulmonary neg pulmonary ROS,    Pulmonary exam normal breath sounds clear to auscultation       Cardiovascular hypertension, Pt. on home beta blockers Normal cardiovascular exam+ dysrhythmias Supra Ventricular Tachycardia + pacemaker  Rhythm:Regular Rate:Normal  postural orthostatic tachycardia syndrome  11/13/17 Stress Echo:  Normal left ventricular function and global wall motion with stress. There were no segmental wall motion abnormalities post exercise. There was normal   increase in global LV function post exercise. The estimated LV ejection fraction is 65-70% with stress. Negative exercise echocardiography for inducible ischemia at target heart rate.    Neuro/Psych  Headaches, Seizures -,  PSYCHIATRIC DISORDERS Anxiety Depression Neurocardiogenic syncope     GI/Hepatic Neg liver ROS, GERD  Medicated,  Endo/Other  Hypothyroidism Obesity   Renal/GU negative Renal ROS   Mixed Urinary Incontience    Musculoskeletal negative musculoskeletal ROS (+)   Abdominal   Peds  (+) ATTENTION DEFICIT DISORDER WITHOUT HYPERACTIVITY Hematology negative hematology ROS (+)   Anesthesia Other Findings Day of surgery medications reviewed with the patient.  Reproductive/Obstetrics                             Anesthesia Physical Anesthesia Plan  ASA: IV  Anesthesia Plan: General   Post-op Pain Management:    Induction: Intravenous  PONV Risk Score and Plan: 3 and Ondansetron, Dexamethasone and Midazolam  Airway Management Planned: Oral  ETT  Additional Equipment:   Intra-op Plan:   Post-operative Plan: Extubation in OR  Informed Consent: I have reviewed the patients History and Physical, chart, labs and discussed the procedure including the risks, benefits and alternatives for the proposed anesthesia with the patient or authorized representative who has indicated his/her understanding and acceptance.   Dental advisory given  Plan Discussed with: CRNA  Anesthesia Plan Comments:         Anesthesia Quick Evaluation

## 2017-11-15 DIAGNOSIS — N800 Endometriosis of uterus: Secondary | ICD-10-CM | POA: Diagnosis not present

## 2017-11-15 LAB — CBC
HCT: 35.3 % — ABNORMAL LOW (ref 36.0–46.0)
Hemoglobin: 11.9 g/dL — ABNORMAL LOW (ref 12.0–15.0)
MCH: 31.3 pg (ref 26.0–34.0)
MCHC: 33.7 g/dL (ref 30.0–36.0)
MCV: 92.9 fL (ref 78.0–100.0)
Platelets: 235 10*3/uL (ref 150–400)
RBC: 3.8 MIL/uL — ABNORMAL LOW (ref 3.87–5.11)
RDW: 13.3 % (ref 11.5–15.5)
WBC: 15.6 10*3/uL — ABNORMAL HIGH (ref 4.0–10.5)

## 2017-11-15 MED ORDER — BENZOCAINE-MENTHOL 20-0.5 % EX AERO
1.0000 "application " | INHALATION_SPRAY | Freq: Four times a day (QID) | CUTANEOUS | Status: DC | PRN
  Administered 2017-11-15: 1 via TOPICAL
  Filled 2017-11-15: qty 56

## 2017-11-15 MED ORDER — SIMETHICONE 80 MG PO CHEW
80.0000 mg | CHEWABLE_TABLET | Freq: Four times a day (QID) | ORAL | Status: DC | PRN
  Administered 2017-11-15 – 2017-11-16 (×3): 80 mg via ORAL
  Filled 2017-11-15 (×3): qty 1

## 2017-11-15 MED ORDER — DIBUCAINE 1 % RE OINT
TOPICAL_OINTMENT | Freq: Three times a day (TID) | RECTAL | Status: DC | PRN
  Administered 2017-11-15: 21:00:00 via RECTAL
  Filled 2017-11-15: qty 28

## 2017-11-15 MED ORDER — IBUPROFEN 600 MG PO TABS
600.0000 mg | ORAL_TABLET | Freq: Four times a day (QID) | ORAL | Status: DC
  Administered 2017-11-15 – 2017-11-17 (×9): 600 mg via ORAL
  Filled 2017-11-15 (×10): qty 1

## 2017-11-15 MED ORDER — LIDOCAINE 5 % EX OINT
TOPICAL_OINTMENT | Freq: Four times a day (QID) | CUTANEOUS | Status: DC | PRN
  Administered 2017-11-16: 22:00:00 via TOPICAL
  Filled 2017-11-15: qty 35.44

## 2017-11-15 MED ORDER — CLONAZEPAM 0.5 MG PO TABS
1.0000 mg | ORAL_TABLET | Freq: Two times a day (BID) | ORAL | Status: DC
  Administered 2017-11-15 – 2017-11-17 (×4): 1 mg via ORAL
  Filled 2017-11-15 (×4): qty 2

## 2017-11-15 MED ORDER — ZOLPIDEM TARTRATE 5 MG PO TABS
10.0000 mg | ORAL_TABLET | Freq: Every evening | ORAL | Status: DC | PRN
Start: 2017-11-15 — End: 2017-11-17
  Administered 2017-11-15 – 2017-11-16 (×2): 10 mg via ORAL
  Filled 2017-11-15 (×2): qty 2

## 2017-11-15 NOTE — Plan of Care (Addendum)
Patient is in a constant pain. She has pain in the buttocks and perineal area. Pain medicine has been given throughout the night but nothing takes the pain away. Will monitor patient today and see if it gets better.   Orders from doctor to set patient up with sitz bath, lidocaine spray, anxiety medicine, and 10 mg percocet. Dr. Stann Mainland be around to assess patient soon.

## 2017-11-15 NOTE — Progress Notes (Addendum)
Patient has removed both IV sites. Will give PO medications for pain.Patient is cramping and very emotional. She is complaining of buttocks pain and cannot lay on area. Nurse gave cold compress and witch hazel pads for possible hemorrhoids. Will reassess in 30 minutes.

## 2017-11-15 NOTE — Progress Notes (Addendum)
Carmen [name] is a36 y.o.  [id number]  Post Op Date # 1 TVH/BS/TVT/A-P Colporrhaphy/ Cystoscopy  Subjective: Patient is Postoperative course complicated by poor pain control.  Patient declined Fentanyl PCA and removed her IV therefore Ketorolac order was not given. Patient has Pain is not well controlled.  Medications being used: narcotic analgesics including Fentanyl and Percocet.. Most recent dosing of Percocet (#2) tablets offered some increased pain relief and the Fentanyl (when given) would offer only relief for 1.5 hours.  The patient has tolerated a regular diet and ambulated in the halls-both without difficulty.   Complains of pelvic cramping and pain in her perineal area.  She has not voided yet and will be catheterized.    Objective: Vital signs in last 24 hours: Temp:  [97.9 F (36.6 C)-99 F (37.2 C)] 99 F (37.2 C) (05/24 0751) Pulse Rate:  [70-97] 71 (05/24 0751) Resp:  [9-23] 18 (05/24 0751) BP: (110-148)/(60-96) 139/76 (05/24 0751) SpO2:  [93 %-99 %] 98 % (05/24 0751) Weight:  [207 lb 4 oz (94 kg)] 207 lb 4 oz (94 kg) (05/23 1609)  Intake/Output from previous day: 05/23 0701 - 05/24 0700 In: 2730 [P.O.:530; I.V.:2200] Out: 2685 [Urine:2360] Intake/Output this shift: No intake/output data recorded. Recent Labs  Lab 11/14/17 0716  WBC 6.8  HGB 14.2  HCT 41.1  PLT 212     Recent Labs  Lab 11/14/17 0716  NA 136  K 3.4*  CL 102  CO2 23  BUN 17  CREATININE 0.71  CALCIUM 8.6*  GLUCOSE 77     EXAM: General: alert, cooperative and moderate distress Resp: clear to auscultation bilaterally Cardio: regular rate and rhythm, S1, S2 normal, no murmur, click, rub or gallop GI: Bowel sounds present but somewhat diminished; soft with tenderness and guarding in supra-pubic area. Extremities: Homans sign is negative, no sign of DVT and no calf tenderness. Vaginal Bleeding: faint pink dry stain on perineal pad; ? mild edema of mons area.   Assessment: s/p  Procedure(s): TRANSVAGINAL TAPE (TVT) PROCEDURE HYSTERECTOMY VAGINAL BILATERAL SALPINGECTOMY ANTERIOR (CYSTOCELE) AND POSTERIOR REPAIR (RECTOCELE) CYSTOSCOPY: stable and poor pain management.  Plan: Consulted Drs. Su Hilt and Rivard with recommendatiions to: in & out catheterization; sitz baths, change pain medication to oxycodone 5-10 mg with acetaminophen 1000 mg every 6 hours along with Ibuprofen 600 mg every 6 hours and ice pack to perineal area.  Routine care  LOS: 0 days    [name], PA-C 11/15/2017 8:14 AM   Addendum:  In & Out Cath yielded 700 cc of urine with the patient feeling much better and tenderness in the supra pubic area now without guarding.  1435 Pt did not pass TOV.  Will replace foley and try again in the morning.  +BS.  Pt with severe pain after 2.5hrs.  Relieved once bladder emptied. Minimal vaginal bleeding.  Swelling at mons with some bruising.  If unable to pass TOV tomorrow, pt will likely go home with foley for a week.  Urine to cx.  Pt requesting dibucaine topical ointment for hemorrhoids.

## 2017-11-15 NOTE — Progress Notes (Signed)
Carmen [name] is a36 y.o.  [id number]  Post Op Date # 1 TVH/BS/TVT/A-P Colporrhaphy/ Cystoscopy  Subjective: Pain is much improved with current regimen of Ibuprofen/Percocet as well as in and out cath.  The patient has tolerated a regular diet and ambulated in the halls-both without difficulty.   Complains of pelvic cramping and pain in her perineal area which is also better with sitz bath. Her Foley was just reinserted by Dr Su Hilt for bladder rest and 2nd voiding trial tomorrow.    Objective: Vital signs in last 24 hours: Temp:  [98.2 F (36.8 C)-99 F (37.2 C)] 99 F (37.2 C) (05/24 0751) Pulse Rate:  [70-97] 77 (05/24 1218) Resp:  [16-18] 18 (05/24 1218) BP: (110-148)/(65-96) 134/81 (05/24 1218) SpO2:  [96 %-99 %] 99 % (05/24 1218) Weight:  [207 lb 4 oz (94 kg)] 207 lb 4 oz (94 kg) (05/23 1609)  Intake/Output from previous day: 05/23 0701 - 05/24 0700 In: 2730 [P.O.:530; I.V.:2200] Out: 2685 [Urine:2360] Intake/Output this shift: Total I/O In: -  Out: 1800 [Urine:1800] Recent Labs  Lab 11/14/17 0716 11/15/17 0745  WBC 6.8 15.6*  HGB 14.2 11.9*  HCT 41.1 35.3*  PLT 212 235     Recent Labs  Lab 11/14/17 0716  NA 136  K 3.4*  CL 102  CO2 23  BUN 17  CREATININE 0.71  CALCIUM 8.6*  GLUCOSE 77      Assessment: s/p Procedure(s): TRANSVAGINAL TAPE (TVT) PROCEDURE HYSTERECTOMY VAGINAL BILATERAL SALPINGECTOMY ANTERIOR (CYSTOCELE) AND POSTERIOR REPAIR (RECTOCELE) CYSTOSCOPY: stable and better pain control  Plan: Continue to optimize pain control Bladder rest until morning Post-op recovery expectations reviewed Questions answered  LOS: 0 days    Esmeralda Arthur, MD 11/15/2017 2:59 PM

## 2017-11-16 DIAGNOSIS — N800 Endometriosis of uterus: Secondary | ICD-10-CM | POA: Diagnosis not present

## 2017-11-16 LAB — URINALYSIS, ROUTINE W REFLEX MICROSCOPIC
Bilirubin Urine: NEGATIVE
Glucose, UA: NEGATIVE mg/dL
Ketones, ur: NEGATIVE mg/dL
Nitrite: NEGATIVE
Protein, ur: NEGATIVE mg/dL
RBC / HPF: >50 RBC/hpf — ABNORMAL HIGH (ref 0–5)
Specific Gravity, Urine: 1.014 (ref 1.005–1.030)
pH: 5 (ref 5.0–8.0)

## 2017-11-16 LAB — URINE CULTURE: Culture: NO GROWTH

## 2017-11-16 NOTE — Plan of Care (Signed)
Patient has been resting in bed. She has voided x4 this shift. Urine is clear, yellow, and no odor present. Patient has taken Percocet and Motrin for pain and reports that she has some relief.

## 2017-11-16 NOTE — Progress Notes (Signed)
Pt sleeping. 

## 2017-11-16 NOTE — Progress Notes (Signed)
Subjective: Patient reports incisional pain, tolerating PO and + flatus.  Pt no longer felt urge to void after straight cath.  Bladder function is returning.  She is ambulating.  Pain is adequately controlled.  Objective: I have reviewed patient's vital signs and intake and output.   General: alert and mild distress Resp: clear to auscultation bilaterally Cardio: regular rate and rhythm GI: softly distended, BS are slightly decreased compared with yesterday, no rebound or guarding, incisions c/d with dermabond intact Extremities: no calf tenderness Vaginal Bleeding: minimal   Assessment/Plan: POD 2 s/p TVH,BS,A-P Repair,TVT and Cystoscopy with slow GI function although improving.  May be worsened by the percocet.   Pt is voiding.  Since foley removed, she has put out 100, 300, 300 and 100.  Still felt like she had to go.  Cath'd for 700cc clear urine.  Pt had been pushing fluids.  Pt told to limit fluids and void every 1-2hrs.   Tmax 100.2 IS encouraged SCDs for DVT prophylaxis. Will cont to observe today   LOS: 0 days    Purcell Nails 11/16/2017, 12:36 PM

## 2017-11-17 DIAGNOSIS — N800 Endometriosis of uterus: Secondary | ICD-10-CM | POA: Diagnosis not present

## 2017-11-17 DIAGNOSIS — N819 Female genital prolapse, unspecified: Secondary | ICD-10-CM

## 2017-11-17 MED ORDER — OXYCODONE-ACETAMINOPHEN 5-325 MG PO TABS: 1.0000 | ORAL_TABLET | Freq: Four times a day (QID) | ORAL | 0 refills | Status: DC | PRN

## 2017-11-17 MED ORDER — IBUPROFEN 600 MG PO TABS: 600.0000 mg | ORAL_TABLET | Freq: Four times a day (QID) | ORAL | 2 refills | Status: DC

## 2017-11-17 MED ORDER — AMOXICILLIN-POT CLAVULANATE 875-125 MG PO TABS: 1.0000 | ORAL_TABLET | Freq: Two times a day (BID) | ORAL | 0 refills | Status: AC

## 2017-11-17 MED ORDER — ZOLPIDEM TARTRATE 5 MG PO TABS: 5.0000 mg | ORAL_TABLET | Freq: Every evening | ORAL | 0 refills | Status: DC | PRN

## 2017-11-17 NOTE — Progress Notes (Signed)
CSW received consult for patient due to patient having a ten year old son with autism who currently resides in a residential facility called [facility] in [place], [place]. CSW spoke with NT's invovled in patient care, [place] and [place] for information about case. Patient's son has been inpatient at facility for approximately 30 days and is well connected to resources per report to NT. Patient is able to visit her son weekly and have phone contact throughout the week. There is no further CSW intervention needed at this time.  Edwin Dada, MSW, LCSW-A Clinical Social Worker [facility] [phone]

## 2017-11-17 NOTE — Progress Notes (Signed)
All discharge teaching completed. All printed discharge instructions given and explained to the patient. Patient given prescription. She verbalizes an understanding of all instructions given and denies any questions at this time.

## 2017-11-17 NOTE — Progress Notes (Signed)
3 Days Post-Op Procedure(s) (LRB): TRANSVAGINAL TAPE (TVT) PROCEDURE (Bilateral) HYSTERECTOMY VAGINAL (N/A) BILATERAL SALPINGECTOMY (Bilateral) ANTERIOR (CYSTOCELE) AND POSTERIOR REPAIR (RECTOCELE) (N/A) CYSTOSCOPY (N/A)  Subjective: Patient reports that pain is well managed.  Tolerating normal diet  without difficulty. No nausea / vomiting.  Ambulating and voiding 300 cc per void.. Initial dysuria is improving. Feels like she is completely emptying.  Had a BM this morning.  Objective: BP 128/82   Pulse 71   Temp 97.7 F (36.5 C) (Oral)   Resp 16   Ht  (1.6 m)   Wt 207 lb 4 oz (94 kg)   SpO2 98%   BMI 36.71 kg/m  Lungs: clear Heart: normal rate and rhythm Abdomen:soft and appropriately tender with normal BS Extremities: Homans sign is negative, no sign of DVT Incision: suprapubic bruising is increased but incisions are healing well Perineum: healing well  Assessment: s/p Procedure(s): TRANSVAGINAL TAPE (TVT) PROCEDURE HYSTERECTOMY VAGINAL BILATERAL SALPINGECTOMY ANTERIOR (CYSTOCELE) AND POSTERIOR REPAIR (RECTOCELE) CYSTOSCOPY: progressing well  Plan: Discharge home  Instructions reviewed Questions answered  LOS: 0 days    Dois Davenport A Quinley Nesler 11/17/2017, 11:25 AM

## 2017-11-21 ENCOUNTER — Encounter (HOSPITAL_COMMUNITY): Payer: Self-pay | Admitting: Obstetrics and Gynecology

## 2017-11-21 NOTE — Discharge Summary (Signed)
Physician Discharge Summary  Patient ID: Carmen [name] MRN: [id number] DOB/AGE: 1981/05/01 37 y.o.  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  Post operative course was marked by poor pain control (due to narcotic analgesic intolerance)  and urinary retention.  By post operative day # 3 however,  the patient had good pain control with oral analgesia, had  resumed bowel and bladder function and was deemed ready for discharge home.  Discharge hemoglobin was 11.9.  Disposition: Home to Self Care  Discharge Medications:  Allergies as of 11/17/2017      Reactions   Tape Hives   Adhesive tape - tegaderm/paper tape ok   Dilaudid [hydromorphone Hcl] Itching   "I just about crawled out of my skin"   Quinolones Nausea Only      Medication List    TAKE these medications   amoxicillin-clavulanate 875-125 MG tablet Commonly known as:  AUGMENTIN Take 1 tablet by mouth 2 (two) times daily for 5 days.   aspirin EC 81 MG tablet Take 182 mg by mouth daily.   BIOTIN PO Take 1 capsule by mouth daily.   clonazePAM 1 MG tablet Commonly known as:  KLONOPIN Take 1 mg by mouth 2 (two) times daily as needed for anxiety.   fexofenadine 180 MG tablet Commonly known as:  ALLEGRA Take 180 mg by mouth 2 (two) times daily as needed (hives).   ibuprofen 600 MG tablet Commonly known as:  ADVIL,MOTRIN Take 1 tablet (600 mg total) by mouth every 6 (six) hours. What changed:    when to take this  reasons to take this    levothyroxine 50 MCG tablet Commonly known as:  SYNTHROID, LEVOTHROID Take 50 mcg by mouth daily before breakfast.   lisdexamfetamine 40 MG capsule Commonly known as:  VYVANSE Take 40 mg by mouth every morning.   Magnesium 400 MG Caps Take 400 mg by mouth daily.   metoprolol succinate 50 MG 24 hr tablet Commonly known as:  TOPROL-XL Take 100 mg by mouth daily. Take with or immediately following a meal.   multivitamin with minerals Tabs tablet Take 1 tablet by mouth daily.   omeprazole 40 MG capsule Commonly known as:  PRILOSEC Take 40 mg by mouth every other day.   Oxcarbazepine 300 MG tablet Commonly known as:  TRILEPTAL Take 300 mg by mouth 2 (two) times daily.   oxyCODONE-acetaminophen 5-325 MG tablet Commonly known as:  PERCOCET/ROXICET Take 1-2 tablets by mouth every 6 (six) hours as needed for moderate pain.   ranitidine 150 MG tablet Commonly known as:  ZANTAC Take 150 mg by mouth 2 (two) times daily as needed (hives).   SUMAtriptan 100 MG tablet Commonly known as:  IMITREX Take 100 mg by mouth every 2 (two) hours as needed for migraine. May repeat in 2 hours if headache persists or recurs.   venlafaxine XR 150 MG 24 hr capsule Commonly known as:  EFFEXOR-XR Take 150 mg by mouth daily.   XIIDRA 5 % Soln Generic drug:  Lifitegrast Place 1 drop into both eyes 2 (two) times daily.   zolpidem 5 MG tablet  Commonly known as:  AMBIEN Take 1 tablet (5 mg total) by mouth at bedtime as needed for sleep.         Follow-up: Dr. Woodroe Mode. Su Hilt on Nov 20, 2017 at 11:30 a.m.,  Dr. Dois Davenport A. Rivard on December 19, 2017 at 3:30 p.m.  and Dr. Woodroe Mode. Su Hilt on December 19, 2017 at 3:00 p.m.    Signed: [name], PA-C 11/21/2017, 2:34 PM

## 2018-01-09 ENCOUNTER — Other Ambulatory Visit: Payer: Self-pay

## 2018-01-09 ENCOUNTER — Emergency Department
Admission: EM | Admit: 2018-01-09 | Discharge: 2018-01-09 | Disposition: A | Discharge status: Home / Self Care | Payer: BLUE CROSS/BLUE SHIELD | Source: Home / Self Care | Attending: Family Medicine | Admitting: Family Medicine

## 2018-01-09 ENCOUNTER — Emergency Department (INDEPENDENT_AMBULATORY_CARE_PROVIDER_SITE_OTHER): Payer: BLUE CROSS/BLUE SHIELD

## 2018-01-09 ENCOUNTER — Encounter: Payer: Self-pay | Admitting: Emergency Medicine

## 2018-01-09 DIAGNOSIS — R9341 Abnormal radiologic findings on diagnostic imaging of renal pelvis, ureter, or bladder: Secondary | ICD-10-CM

## 2018-01-09 DIAGNOSIS — N200 Calculus of kidney: Secondary | ICD-10-CM

## 2018-01-09 DIAGNOSIS — R197 Diarrhea, unspecified: Secondary | ICD-10-CM

## 2018-01-09 DIAGNOSIS — Z9889 Other specified postprocedural states: Secondary | ICD-10-CM | POA: Diagnosis not present

## 2018-01-09 DIAGNOSIS — N3001 Acute cystitis with hematuria: Secondary | ICD-10-CM

## 2018-01-09 DIAGNOSIS — R112 Nausea with vomiting, unspecified: Secondary | ICD-10-CM

## 2018-01-09 LAB — POCT CBC W AUTO DIFF (K'VILLE URGENT CARE)

## 2018-01-09 MED ORDER — ONDANSETRON 4 MG PO TBDP
4.0000 mg | ORAL_TABLET | Freq: Once | ORAL | Status: AC
  Administered 2018-01-09: 4 mg via ORAL

## 2018-01-09 MED ORDER — CEFTRIAXONE SODIUM 1 G IJ SOLR
1.0000 g | Freq: Once | INTRAMUSCULAR | Status: AC
  Administered 2018-01-09: 1 g via INTRAMUSCULAR

## 2018-01-09 MED ORDER — ONDANSETRON HCL 4 MG PO TABS: 4.0000 mg | ORAL_TABLET | Freq: Four times a day (QID) | ORAL | 0 refills | Status: DC

## 2018-01-09 NOTE — ED Triage Notes (Signed)
Emesis, fatigue, UTI, nauseated since this am, DX with UTI yesterday, started on Macrobid, doesn't feel better.

## 2018-01-09 NOTE — Discharge Instructions (Signed)
°  Please continue to take your macrobid as prescribed. You may want to take with a meal to help prevent stomach upset. Avoid fried fatty foods and dairy until your nausea, vomiting and diarrhea have resolved for at least 24-48 hours as these foods can worsen stomach upset.  Please call your urologist tomorrow and let them know you were given a shot of Rocephin (ceftriaxone) antibiotic and to check on your urine culture in case the macrobid needs to be changed.  Please call 911 or go to the hospital if symptoms worsening- worsening pain, dizziness/passing out, unable to keep down fluids, or other new concerning symptoms develop.

## 2018-01-09 NOTE — ED Provider Notes (Signed)
[name]    CSN: [id number] Arrival date & time: 01/09/18  1315     History   Chief Complaint Chief Complaint  Patient presents with  . Emesis    HPI Carmen [name] is a 37 y.o. female.   HPI Carmen [name] is a 37 y.o. female presenting to UC with c/o nausea, vomiting x1, low back pain and fatigue that started this morning. She was dx with a UTI yesterday and started on Macrobid.  She called [facility] today who advised her to go to the emergency department for further evaluation and treatment. Due to financial reasons, pt decided to come to [facility] first.  She wants to make sure she does not have a kidney stone, which she has had in the past as well as a kidney infection. She does report having a transvaginal tape procedure and cystoscopy in May but has been cleared by her OB/GYN, who does not believe today symptoms are due to the surgery.  Denies known fever or chills. Denies vaginal bleeding but has had hematuria.    Past Medical History:  Diagnosis Date  . Anxiety   . Costochondritis 09/13/2010  . Depression   . GERD (gastroesophageal reflux disease)   . Headache    migrains  . Heart palpitations    long history of   . History of chest pain   . History of cholecystitis    Laparoscopic cholecystectomy  . History of fatigue   . History of kidney stones   . History of migraine headaches   . History of seizure disorder    remote history of seizure activity and was evaluated at [facility] for this in the past  She is presently not on any seizure  medications and has not had any seizures for many years.  . History of tilt table evaluation    IMPRESSION: Positive tilt table study with significant cardioinhibitory  response.  Marland Kitchen Hyperlipidemia   . Hypertension   . Hypothyroidism   . Kidney stones    history of  . Pericarditis 09/13/2010  . Presence of permanent cardiac pacemaker   . PSVT (paroxysmal supraventricular tachycardia) (HCC)   . Sleeping  difficulties   . Stress   . SVD (spontaneous vaginal delivery)    x 1  . Syncope and collapse    neurocardiogenic    Patient Active Problem List   Diagnosis Date Noted  . Pelvic prolapse 11/17/2017  . Incontinence of urine in female 11/14/2017  . PSVT (paroxysmal supraventricular tachycardia) (HCC)   . Heart palpitations   . Syncope and collapse   . Sleeping difficulties   . Pericarditis 09/13/2010  . Hypertension 09/13/2010  . Syncope 09/13/2010  . Costochondritis 09/13/2010    Past Surgical History:  Procedure Laterality Date  . ANTERIOR AND POSTERIOR REPAIR N/A 11/14/2017   Procedure: ANTERIOR (CYSTOCELE) AND POSTERIOR REPAIR (RECTOCELE);  Surgeon: Silverio Lay, MD;  Location: [facility] ORS;  Service: Gynecology;  Laterality: N/A;  . BILATERAL SALPINGECTOMY Bilateral 11/14/2017   Procedure: BILATERAL SALPINGECTOMY;  Surgeon: Silverio Lay, MD;  Location: [facility]S;  Service: Gynecology;  Laterality: Bilateral;  . BLADDER SUSPENSION Bilateral 11/14/2017   Procedure: TRANSVAGINAL TAPE (TVT) PROCEDURE;  Surgeon: Osborn Coho, MD;  Location: [facility] ORS;  Service: Gynecology;  Laterality: Bilateral;  . CHOLECYSTECTOMY  11/2007   Cholecystitis - Laparoscopic cholecystectomy with intraoperative  cholangiogram.  . CHOLECYSTECTOMY    . CYSTOSCOPY N/A 11/14/2017   Procedure: CYSTOSCOPY;  Surgeon: Osborn Coho, MD;  Location: [facility]  ORS;  Service: Gynecology;  Laterality: N/A;  . PACEMAKER INSERTION  2016  . Tilt Table Study  07/17/2005   Positive tilt table study with significant cardioinhibitory  response.  Marland Kitchen VAGINAL HYSTERECTOMY N/A 11/14/2017   Procedure: HYSTERECTOMY VAGINAL;  Surgeon: Silverio Lay, MD;  Location: [facility] ORS;  Service: Gynecology;  Laterality: N/A;  . WISDOM TOOTH EXTRACTION      OB History    Gravida  2   Para  1   Term  1   Preterm      AB      Living  1     SAB      TAB      Ectopic      Multiple      Live Births               Home Medications      Prior to Admission medications   Medication Sig Start Date End Date Taking? Authorizing Provider  aspirin EC 81 MG tablet Take 182 mg by mouth daily.    [provider]  BIOTIN PO Take 1 capsule by mouth daily.    [provider]  clonazePAM (KLONOPIN) 1 MG tablet Take 1 mg by mouth 2 (two) times daily as needed for anxiety.    [provider]  fexofenadine (ALLEGRA) 180 MG tablet Take 180 mg by mouth 2 (two) times daily as needed (hives).    [provider]  ibuprofen (ADVIL,MOTRIN) 600 MG tablet Take 1 tablet (600 mg total) by mouth every 6 (six) hours. 11/17/17   Silverio Lay, MD  levothyroxine (SYNTHROID, LEVOTHROID) 50 MCG tablet Take 50 mcg by mouth daily before breakfast.    [provider]  lisdexamfetamine (VYVANSE) 40 MG capsule Take 40 mg by mouth every morning.    [provider]  Magnesium 400 MG CAPS Take 400 mg by mouth daily.    [provider]  metoprolol succinate (TOPROL-XL) 50 MG 24 hr tablet Take 100 mg by mouth daily. Take with or immediately following a meal.    [provider]  Multiple Vitamin (MULTIVITAMIN WITH MINERALS) TABS tablet Take 1 tablet by mouth daily.    [provider]  omeprazole (PRILOSEC) 40 MG capsule Take 40 mg by mouth every other day.     [provider]  ondansetron (ZOFRAN) 4 MG tablet Take 1 tablet (4 mg total) by mouth every 6 (six) hours. 01/09/18   Lurene Shadow, PA-C  Oxcarbazepine (TRILEPTAL) 300 MG tablet Take 300 mg by mouth 2 (two) times daily.    [provider]  oxyCODONE-acetaminophen (PERCOCET/ROXICET) 5-325 MG tablet Take 1-2 tablets by mouth every 6 (six) hours as needed for moderate pain. 11/17/17   Silverio Lay, MD  ranitidine (ZANTAC) 150 MG tablet Take 150 mg by mouth 2 (two) times daily as needed (hives).    [provider]  SUMAtriptan (IMITREX) 100 MG tablet Take 100 mg by mouth every 2 (two) hours as needed for  migraine. May repeat in 2 hours if headache persists or recurs.    [provider]  venlafaxine XR (EFFEXOR-XR) 150 MG 24 hr capsule Take 150 mg by mouth daily.    [provider]  XIIDRA 5 % SOLN Place 1 drop into both eyes 2 (two) times daily. 09/26/17   [provider]  zolpidem (AMBIEN) 5 MG tablet Take 1 tablet (5 mg total) by mouth at bedtime as needed for sleep. 11/17/17   Silverio Lay, MD  Family History Family History  Problem Relation Age of Onset  . Hypertension Mother   . Heart disease Mother   . Cancer Mother        breast  . Heart attack Father   . Hyperlipidemia Father   . Hepatitis C Father   . Cancer Maternal Aunt        Breast    Social History Social History   Tobacco Use  . Smoking status: Never Smoker  . Smokeless tobacco: Never Used  Substance Use Topics  . Alcohol use: No  . Drug use: No     Allergies   Tape; Dilaudid [hydromorphone hcl]; and Quinolones   Review of Systems Review of Systems  Constitutional: Positive for fatigue. Negative for chills and fever.  HENT: Negative for congestion and sore throat.   Respiratory: Negative for chest tightness and shortness of breath.   Gastrointestinal: Positive for abdominal pain, diarrhea, nausea and vomiting.  Genitourinary: Positive for dysuria, flank pain and hematuria. Negative for frequency.  Musculoskeletal: Positive for back pain and myalgias.  Neurological: Negative for dizziness, light-headedness and headaches.     Physical Exam Triage Vital Signs ED Triage Vitals  Enc Vitals Group     BP 01/09/18 1342 (!) 150/103     Pulse Rate 01/09/18 1342 95     Resp --      Temp 01/09/18 1342 98.5 F (36.9 C)     Temp Source 01/09/18 1342 Oral     SpO2 01/09/18 1342 97 %     Weight 01/09/18 1344 205 lb (93 kg)     Height 01/09/18 1344 5\' 3"  (1.6 m)     Head Circumference --      Peak Flow --      Pain Score 01/09/18 1343 5     Pain Loc --      Pain Edu? --       Excl. in GC? --    No data found.  Updated Vital Signs BP (!) 136/108 (BP Location: Right Arm)   Pulse 95   Temp 98.5 F (36.9 C) (Oral)   Ht 5\' 3"  (1.6 m)   Wt 205 lb (93 kg)   SpO2 97%   BMI 36.31 kg/m      Physical Exam  Constitutional: She is oriented to person, place, and time. She appears well-developed and well-nourished. No distress.  HENT:  Head: Normocephalic and atraumatic.  Mouth/Throat: Oropharynx is clear and moist.  Eyes: EOM are normal.  Neck: Normal range of motion. Neck supple.  Cardiovascular: Normal rate and regular rhythm.  Pulmonary/Chest: Effort normal and breath sounds normal. No stridor. No respiratory distress. She has no wheezes. She has no rales.  Abdominal: Soft. She exhibits no distension and no mass. There is tenderness in the suprapubic area and left lower quadrant. There is CVA tenderness ( left). There is no rebound and no guarding.  Musculoskeletal: Normal range of motion.  Neurological: She is alert and oriented to person, place, and time.  Skin: Skin is warm and dry. She is not diaphoretic.  Psychiatric: She has a normal mood and affect. Her behavior is normal.  Nursing note and vitals reviewed.    UC Treatments / Results  Labs (all labs ordered are listed, but only abnormal results are displayed) Labs Reviewed  COMPLETE METABOLIC PANEL WITH GFR  POCT CBC W AUTO DIFF ([facility])   Urinalysis, Dipstick (automated) From [facility], Dr. Hadley Pen 01/08/2018  12:07:07 Nitrite: POSITIVE Leukocytes: 3+ WBC: >  60/hpf Bacteria: Mod (26-50/hpf)  Culture pending.  (see scanned UA report in pt files)  EKG None  Radiology Ct Renal Stone Study  Result Date: 01/09/2018 CLINICAL DATA:  Status post hysterectomy and bladder surgery in May of 2019. Patient complains of flank pain and bloating since surgery. EXAM: CT ABDOMEN AND PELVIS WITHOUT CONTRAST TECHNIQUE: Multidetector CT imaging of the abdomen and pelvis was  performed following the standard protocol without IV contrast. COMPARISON:  December 19, 2007 FINDINGS: Lower chest: No acute abnormality. Hepatobiliary: No focal liver abnormality is seen. Status post cholecystectomy. No biliary dilatation. Pancreas: Unremarkable. No pancreatic ductal dilatation or surrounding inflammatory changes. Spleen: Normal in size without focal abnormality. Adrenals/Urinary Tract: The adrenal glands are normal. There are nonobstructing stones in both kidneys. No hydronephrosis is identified bilaterally. No focal stone is identified in bilateral collecting systems. The bladder is decompressed with thickened wall which may be secondary to decompression of bladder. Stranding is noted surrounding the bladder. Stomach/Bowel: Small hiatal hernia is identified. The stomach is otherwise normal. The appendix is not definitely seen but no inflammation is noted around cecum. No evidence of bowel wall thickening, distention, or inflammatory changes. Vascular/Lymphatic: No significant vascular findings are present. No enlarged abdominal or pelvic lymph nodes. Reproductive: Status post hysterectomy. Small cyst is identified in the left ovary. Other: None. Musculoskeletal: No acute abnormality identified. IMPRESSION: Nonobstructing stones identified in both kidneys. There is no hydronephrosis bilaterally. Decompressed bladder with diffuse thickened wall which may be secondary to decompression. Stranding is noted surrounding the bladder, this can be seen in cystitis. Electronically Signed   By: Sherian ReinWei-Chen  Lin M.D.   On: 01/09/2018 14:42    Procedures Procedures (including critical care time)  Medications Ordered in UC Medications  ondansetron (ZOFRAN-ODT) disintegrating tablet 4 mg (4 mg Oral Given 01/09/18 1422)  cefTRIAXone (ROCEPHIN) injection 1 g (1 g Intramuscular Given 01/09/18 1521)    Initial Impression / Assessment and Plan / UC Course  I have reviewed the triage vital signs and the nursing  notes.  Pertinent labs & imaging results that were available during my care of the patient were reviewed by me and considered in my medical decision making (see chart for details).     Discussed imaging above.  Pt brought her UA results from yesterday, noted above. Pt did take Azo PTA. UA not repeated today  Pt noted [facility] did send off a urine culture yesterday, pending. Symptoms likely due to cystitis, possible early pyelonephritis Rocephin 1g IM given today while culture pending Home care instructions provided below.    Final Clinical Impressions(s) / UC Diagnoses   Final diagnoses:  Acute cystitis with hematuria  Nausea vomiting and diarrhea    Discharge Instructions      Please continue to take your macrobid as prescribed. You may want to take with a meal to help prevent stomach upset. Avoid fried fatty foods and dairy until your nausea, vomiting and diarrhea have resolved for at least 24-48 hours as these foods can worsen stomach upset.  Please call your urologist tomorrow and let them know you were given a shot of Rocephin (ceftriaxone) antibiotic and to check on your urine culture in case the macrobid needs to be changed.  Please call 911 or go to the hospital if symptoms worsening- worsening pain, dizziness/passing out, unable to keep down fluids, or other new concerning symptoms develop.     ED Prescriptions    Medication Sig Dispense Auth. Provider   ondansetron (ZOFRAN) 4 MG tablet  Take 1 tablet (4 mg total) by mouth every 6 (six) hours. 12 tablet Lurene Shadow, PA-C     Controlled Substance Prescriptions [place] Controlled Substance Registry consulted? Not Applicable   Rolla Plate 01/09/18 1836

## 2018-01-10 LAB — COMPLETE METABOLIC PANEL WITH GFR
AG Ratio: 1.6 (calc) (ref 1.0–2.5)
ALT: 17 U/L (ref 6–29)
AST: 18 U/L (ref 10–30)
Albumin: 4.5 g/dL (ref 3.6–5.1)
Alkaline phosphatase (APISO): 83 U/L (ref 33–115)
BUN: 11 mg/dL (ref 7–25)
CO2: 24 mmol/L (ref 20–32)
Calcium: 9.3 mg/dL (ref 8.6–10.2)
Chloride: 104 mmol/L (ref 98–110)
Creat: 0.65 mg/dL (ref 0.50–1.10)
GFR, Est African American: 131 mL/min/{1.73_m2} (ref >=60)
GFR, Est Non African American: 113 mL/min/{1.73_m2} (ref >=60)
Globulin: 2.8 g/dL (calc) (ref 1.9–3.7)
Glucose, Bld: 88 mg/dL (ref 65–99)
Potassium: 3.9 mmol/L (ref 3.5–5.3)
Sodium: 138 mmol/L (ref 135–146)
Total Bilirubin: 0.4 mg/dL (ref 0.2–1.2)
Total Protein: 7.3 g/dL (ref 6.1–8.1)

## 2018-01-10 LAB — EXTRA LAV TOP TUBE

## 2018-08-26 ENCOUNTER — Encounter (HOSPITAL_BASED_OUTPATIENT_CLINIC_OR_DEPARTMENT_OTHER): Payer: Self-pay | Admitting: Emergency Medicine

## 2018-08-26 ENCOUNTER — Other Ambulatory Visit: Payer: Self-pay

## 2018-08-26 ENCOUNTER — Emergency Department (HOSPITAL_BASED_OUTPATIENT_CLINIC_OR_DEPARTMENT_OTHER)
Admission: EM | Admit: 2018-08-26 | Discharge: 2018-08-27 | Disposition: A | Discharge status: Home / Self Care | Payer: BLUE CROSS/BLUE SHIELD | Attending: Emergency Medicine | Admitting: Emergency Medicine

## 2018-08-26 DIAGNOSIS — Y999 Unspecified external cause status: Secondary | ICD-10-CM | POA: Diagnosis not present

## 2018-08-26 DIAGNOSIS — Y929 Unspecified place or not applicable: Secondary | ICD-10-CM | POA: Diagnosis not present

## 2018-08-26 DIAGNOSIS — Z79899 Other long term (current) drug therapy: Secondary | ICD-10-CM | POA: Diagnosis not present

## 2018-08-26 DIAGNOSIS — E039 Hypothyroidism, unspecified: Secondary | ICD-10-CM | POA: Diagnosis not present

## 2018-08-26 DIAGNOSIS — I10 Essential (primary) hypertension: Secondary | ICD-10-CM | POA: Insufficient documentation

## 2018-08-26 DIAGNOSIS — S0990XA Unspecified injury of head, initial encounter: Secondary | ICD-10-CM | POA: Insufficient documentation

## 2018-08-26 DIAGNOSIS — Z7982 Long term (current) use of aspirin: Secondary | ICD-10-CM | POA: Insufficient documentation

## 2018-08-26 DIAGNOSIS — Y939 Activity, unspecified: Secondary | ICD-10-CM | POA: Diagnosis not present
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
## 2018-08-27 ENCOUNTER — Emergency Department (HOSPITAL_BASED_OUTPATIENT_CLINIC_OR_DEPARTMENT_OTHER): Payer: BLUE CROSS/BLUE SHIELD

## 2018-08-27 MED ORDER — IBUPROFEN 800 MG PO TABS
800.0000 mg | ORAL_TABLET | Freq: Once | ORAL | Status: AC
  Administered 2018-08-27: 800 mg via ORAL
  Filled 2018-08-27: qty 1

## 2018-08-27 MED ORDER — ACETAMINOPHEN 500 MG PO TABS
1000.0000 mg | ORAL_TABLET | Freq: Once | ORAL | Status: AC
  Administered 2018-08-27: 1000 mg via ORAL
  Filled 2018-08-27: qty 2

## 2018-08-27 NOTE — ED Notes (Signed)
PT states understanding of care given, follow up care. PT ambulated from ED to car with a steady gait.  
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
## 2018-11-06 ENCOUNTER — Other Ambulatory Visit: Payer: Self-pay | Admitting: Obstetrics and Gynecology

## 2018-11-06 DIAGNOSIS — R319 Hematuria, unspecified: Secondary | ICD-10-CM

## 2018-11-06 DIAGNOSIS — R109 Unspecified abdominal pain: Secondary | ICD-10-CM

## 2018-11-07 ENCOUNTER — Ambulatory Visit (INDEPENDENT_AMBULATORY_CARE_PROVIDER_SITE_OTHER): Payer: BLUE CROSS/BLUE SHIELD

## 2018-11-07 ENCOUNTER — Other Ambulatory Visit: Payer: Self-pay

## 2018-11-07 ENCOUNTER — Emergency Department (HOSPITAL_BASED_OUTPATIENT_CLINIC_OR_DEPARTMENT_OTHER)
Admission: EM | Admit: 2018-11-07 | Discharge: 2018-11-07 | Disposition: A | Discharge status: Home / Self Care | Payer: BLUE CROSS/BLUE SHIELD | Attending: Emergency Medicine | Admitting: Emergency Medicine

## 2018-11-07 ENCOUNTER — Encounter (HOSPITAL_BASED_OUTPATIENT_CLINIC_OR_DEPARTMENT_OTHER): Payer: Self-pay | Admitting: Adult Health

## 2018-11-07 DIAGNOSIS — N3289 Other specified disorders of bladder: Secondary | ICD-10-CM | POA: Diagnosis not present

## 2018-11-07 DIAGNOSIS — Z7982 Long term (current) use of aspirin: Secondary | ICD-10-CM | POA: Insufficient documentation

## 2018-11-07 DIAGNOSIS — R319 Hematuria, unspecified: Secondary | ICD-10-CM | POA: Diagnosis not present

## 2018-11-07 DIAGNOSIS — R109 Unspecified abdominal pain: Secondary | ICD-10-CM | POA: Diagnosis not present

## 2018-11-07 DIAGNOSIS — Z79899 Other long term (current) drug therapy: Secondary | ICD-10-CM | POA: Diagnosis not present

## 2018-11-07 DIAGNOSIS — R7989 Other specified abnormal findings of blood chemistry: Secondary | ICD-10-CM

## 2018-11-07 DIAGNOSIS — R339 Retention of urine, unspecified: Secondary | ICD-10-CM | POA: Insufficient documentation

## 2018-11-07 LAB — PREGNANCY, URINE: Preg Test, Ur: NEGATIVE

## 2018-11-07 LAB — URINALYSIS, ROUTINE W REFLEX MICROSCOPIC

## 2018-11-07 LAB — CBC WITH DIFFERENTIAL/PLATELET
Abs Immature Granulocytes: 0.01 10*3/uL (ref 0.00–0.07)
Basophils Absolute: 0 10*3/uL (ref 0.0–0.1)
Basophils Relative: 0 %
Eosinophils Absolute: 0.2 10*3/uL (ref 0.0–0.5)
Eosinophils Relative: 4 %
HCT: 37.1 % (ref 36.0–46.0)
Hemoglobin: 12.5 g/dL (ref 12.0–15.0)
Immature Granulocytes: 0 %
Lymphocytes Relative: 28 %
Lymphs Abs: 1.1 10*3/uL (ref 0.7–4.0)
MCH: 31.2 pg (ref 26.0–34.0)
MCHC: 33.7 g/dL (ref 30.0–36.0)
MCV: 92.5 fL (ref 80.0–100.0)
Monocytes Absolute: 0.3 10*3/uL (ref 0.1–1.0)
Monocytes Relative: 7 %
Neutro Abs: 2.3 10*3/uL (ref 1.7–7.7)
Neutrophils Relative %: 61 %
Platelets: 179 10*3/uL (ref 150–400)
RBC: 4.01 MIL/uL (ref 3.87–5.11)
RDW: 12.5 % (ref 11.5–15.5)
WBC: 3.9 10*3/uL — ABNORMAL LOW (ref 4.0–10.5)
nRBC: 0 % (ref 0.0–0.2)

## 2018-11-07 LAB — COMPREHENSIVE METABOLIC PANEL
ALT: 299 U/L — ABNORMAL HIGH (ref 0–44)
AST: 390 U/L — ABNORMAL HIGH (ref 15–41)
Albumin: 3.9 g/dL (ref 3.5–5.0)
Alkaline Phosphatase: 123 U/L (ref 38–126)
Anion gap: 7 (ref 5–15)
BUN: 9 mg/dL (ref 6–20)
CO2: 26 mmol/L (ref 22–32)
Calcium: 9.2 mg/dL (ref 8.9–10.3)
Chloride: 101 mmol/L (ref 98–111)
Creatinine, Ser: 0.85 mg/dL (ref 0.44–1.00)
GFR calc Af Amer: >60 mL/min (ref >60)
GFR calc non Af Amer: >60 mL/min (ref >60)
Glucose, Bld: 90 mg/dL (ref 70–99)
Potassium: 3.7 mmol/L (ref 3.5–5.1)
Sodium: 134 mmol/L — ABNORMAL LOW (ref 135–145)
Total Bilirubin: 0.7 mg/dL (ref 0.3–1.2)
Total Protein: 7.2 g/dL (ref 6.5–8.1)

## 2018-11-07 LAB — URINALYSIS, MICROSCOPIC (REFLEX): RBC / HPF: >50 RBC/hpf (ref 0–5)

## 2018-11-07 MED ORDER — FENTANYL CITRATE (PF) 100 MCG/2ML IJ SOLN
25.0000 ug | Freq: Once | INTRAMUSCULAR | Status: AC
  Administered 2018-11-07: 25 ug via INTRAVENOUS
  Filled 2018-11-07: qty 2

## 2018-11-07 NOTE — Discharge Instructions (Addendum)
Continue taking home medications as prescribed.   Call Dr. Shannan Harper office with urology on Monday to set up follow-up appointment. Keep the Foley in to help prevent urinary retention. Follow-up with your primary care doctor for further evaluation of your liver.  In the meantime, do not use Tylenol, and stop drinking alcohol. Return to the emergency room with any new, worsening, concerning symptoms.

## 2018-11-07 NOTE — ED Triage Notes (Signed)
Pt reports that she has hematuria and has since April 28th. She had a CT scan today, her MD believes it is a stone. What changed is that she is now unable to void and has not voided since 7:30 Am today. She says it feels like she needs to urinate but is unable.

## 2018-11-07 NOTE — ED Notes (Signed)
Instructed pt on leg bag application. Supplies and demonstration by pt complete. Verbalized understanding. Pt to see Urology on Monday 5/19

## 2018-11-07 NOTE — ED Provider Notes (Signed)
[facility] EMERGENCY DEPARTMENT Provider Note   CSN: [id number] Arrival date & time: 11/07/18  1403    History   Chief Complaint Chief Complaint  Patient presents with   Urinary Retention    HPI Carmen [name] is a 38 y.o. female presenting for evaluation of urinary retention.  Patient states she has been having issues with hematuria for the past several weeks.  She saw her GYN who performed surgery on her bladder and did her hysterectomy last year, and was started on antibiotic for possible UTI.  There was concern for kidney stone, so CT scan was ordered today.  Results are not returned.  However, patient has been unable to urinate since 730 this morning.  Patient states she has the need to urinate, but is unable to do so.  She states it is not pain that is preventing her from urinating, but actual inability.  Patient states she does have supra pubic pain, she does not present for the past several weeks.  She denies fevers, chills, sore throat, cough, chest pain, nausea, vomiting, upper abdominal pain.  She reports loose bowel movements, but denies diarrhea or blood in her stool.  Patient states she has not been having urgency or dysuria similar to previous UTIs.  Patient states she had her husband look at her external genitalia and he felt it looked abnormal, however when she sent a picture to her ob/gyn, they were unconcerned.  Additional history obtained from chart review.  Patient with a history of kidney stones, GERD, depression, hypertension, SVT status post pacemaker, hyperlipidemia, hypothyroidism.   Patient states she does not drink a glass of wine or a hard cider about once every 3 days.  She denies Tylenol use.  She denies smoking or drug use.     HPI  Past Medical History:  Diagnosis Date   Anxiety    Costochondritis 09/13/2010   Depression    GERD (gastroesophageal reflux disease)    Headache    migrains   Heart palpitations    long history of     History of chest pain    History of cholecystitis    Laparoscopic cholecystectomy   History of fatigue    History of kidney stones    History of migraine headaches    History of seizure disorder    remote history of seizure activity and was evaluated at [facility] for this in the past  She is presently not on any seizure  medications and has not had any seizures for many years.   History of tilt table evaluation    IMPRESSION: Positive tilt table study with significant cardioinhibitory  response.   Hyperlipidemia    Hypertension    Hypothyroidism    Kidney stones    history of   Pericarditis 09/13/2010   Presence of permanent cardiac pacemaker    PSVT (paroxysmal supraventricular tachycardia) (HCC)    Sleeping difficulties    Stress    SVD (spontaneous vaginal delivery)    x 1   Syncope and collapse    neurocardiogenic    Patient Active Problem List   Diagnosis Date Noted   Pelvic prolapse 11/17/2017   Incontinence of urine in female 11/14/2017   PSVT (paroxysmal supraventricular tachycardia) (HCC)    Heart palpitations    Syncope and collapse    Sleeping difficulties    Pericarditis 09/13/2010   Hypertension 09/13/2010   Syncope 09/13/2010   Costochondritis 09/13/2010    Past Surgical History:  Procedure Laterality Date  ANTERIOR AND POSTERIOR REPAIR N/A 11/14/2017   Procedure: ANTERIOR (CYSTOCELE) AND POSTERIOR REPAIR (RECTOCELE);  Surgeon: Silverio Lay, MD;  Location: [facility];  Service: Gynecology;  Laterality: N/A;   BILATERAL SALPINGECTOMY Bilateral 11/14/2017   Procedure: BILATERAL SALPINGECTOMY;  Surgeon: Silverio Lay, MD;  Location: [facility];  Service: Gynecology;  Laterality: Bilateral;   BLADDER SUSPENSION Bilateral 11/14/2017   Procedure: TRANSVAGINAL TAPE (TVT) PROCEDURE;  Surgeon: Osborn Coho, MD;  Location: [facility];  Service: Gynecology;  Laterality: Bilateral;   CHOLECYSTECTOMY  11/2007   Cholecystitis - Laparoscopic  cholecystectomy with intraoperative  cholangiogram.   CHOLECYSTECTOMY     CYSTOSCOPY N/A 11/14/2017   Procedure: CYSTOSCOPY;  Surgeon: Osborn Coho, MD;  Location: [facility];  Service: Gynecology;  Laterality: N/A;   PACEMAKER INSERTION  2016   Tilt Table Study  07/17/2005   Positive tilt table study with significant cardioinhibitory  response.   VAGINAL HYSTERECTOMY N/A 11/14/2017   Procedure: HYSTERECTOMY VAGINAL;  Surgeon: Silverio Lay, MD;  Location: [facility];  Service: Gynecology;  Laterality: N/A;   WISDOM TOOTH EXTRACTION       OB History    Gravida  2   Para  1   Term  1   Preterm      AB      Living  1     SAB      TAB      Ectopic      Multiple      Live Births               Home Medications    Prior to Admission medications   Medication Sig Start Date End Date Taking? Authorizing Provider  aspirin EC 81 MG tablet Take 182 mg by mouth daily.    [provider]  BIOTIN PO Take 1 capsule by mouth daily.    [provider]  clonazePAM (KLONOPIN) 1 MG tablet Take 1 mg by mouth 2 (two) times daily as needed for anxiety.    [provider]  fexofenadine (ALLEGRA) 180 MG tablet Take 180 mg by mouth 2 (two) times daily as needed (hives).    [provider]  ibuprofen (ADVIL,MOTRIN) 600 MG tablet Take 1 tablet (600 mg total) by mouth every 6 (six) hours. 11/17/17   Silverio Lay, MD  levothyroxine (SYNTHROID, LEVOTHROID) 50 MCG tablet Take 50 mcg by mouth daily before breakfast.    [provider]  lisdexamfetamine (VYVANSE) 40 MG capsule Take 40 mg by mouth every morning.    [provider]  Magnesium 400 MG CAPS Take 400 mg by mouth daily.    [provider]  metoprolol succinate (TOPROL-XL) 50 MG 24 hr tablet Take 100 mg by mouth daily. Take with or immediately following a meal.    [provider]  Multiple Vitamin (MULTIVITAMIN WITH MINERALS) TABS tablet Take 1 tablet by mouth daily.     [provider]  omeprazole (PRILOSEC) 40 MG capsule Take 40 mg by mouth every other day.     [provider]  ondansetron (ZOFRAN) 4 MG tablet Take 1 tablet (4 mg total) by mouth every 6 (six) hours. 01/09/18   Lurene Shadow, PA-C  Oxcarbazepine (TRILEPTAL) 300 MG tablet Take 300 mg by mouth 2 (two) times daily.    [provider]  oxyCODONE-acetaminophen (PERCOCET/ROXICET) 5-325 MG tablet Take 1-2 tablets by mouth every 6 (six) hours as needed for moderate pain. 11/17/17   Silverio Lay, MD  ranitidine (ZANTAC) 150 MG tablet  Take 150 mg by mouth 2 (two) times daily as needed (hives).    [provider]  SUMAtriptan (IMITREX) 100 MG tablet Take 100 mg by mouth every 2 (two) hours as needed for migraine. May repeat in 2 hours if headache persists or recurs.    [provider]  venlafaxine XR (EFFEXOR-XR) 150 MG 24 hr capsule Take 150 mg by mouth daily.    [provider]  XIIDRA 5 % SOLN Place 1 drop into both eyes 2 (two) times daily. 09/26/17   [provider]  zolpidem (AMBIEN) 5 MG tablet Take 1 tablet (5 mg total) by mouth at bedtime as needed for sleep. 11/17/17   Silverio Lay, MD    Family History Family History  Problem Relation Age of Onset   Hypertension Mother    Heart disease Mother    Cancer Mother        breast   Heart attack Father    Hyperlipidemia Father    Hepatitis C Father    Cancer Maternal Aunt        Breast    Social History Social History   Tobacco Use   Smoking status: Never Smoker   Smokeless tobacco: Never Used  Substance Use Topics   Alcohol use: No   Drug use: No     Allergies   Tape; Dilaudid [hydromorphone hcl]; and Quinolones   Review of Systems Review of Systems  Genitourinary: Positive for difficulty urinating, hematuria and pelvic pain (suprapubic pain).  All other systems reviewed and are negative.      Physical Exam Updated Vital Signs BP 125/86    Pulse  83    Temp 97.8 F (36.6 C) (Oral)    Resp 20    LMP  (LMP Unknown)    SpO2 100%   Physical Exam Vitals signs and nursing note reviewed. Exam conducted with a chaperone present.  Constitutional:      General: She is not in acute distress.    Appearance: She is well-developed.     Comments: Appears nontoxic  HENT:     Head: Normocephalic and atraumatic.  Eyes:     Conjunctiva/sclera: Conjunctivae normal.     Pupils: Pupils are equal, round, and reactive to light.  Neck:     Musculoskeletal: Normal range of motion and neck supple.  Cardiovascular:     Rate and Rhythm: Normal rate and regular rhythm.  Pulmonary:     Effort: Pulmonary effort is normal. No respiratory distress.     Breath sounds: Normal breath sounds. No wheezing.  Abdominal:     General: There is no distension.     Palpations: Abdomen is soft. There is no mass.     Tenderness: There is no abdominal tenderness. There is no guarding or rebound.     Comments: Obese female.  No tenderness palpation.  Soft without rigidity, guarding, distention.  Negative rebound.  Genitourinary:    Comments: No external bleeding noted.  No tears or signs of trauma. Musculoskeletal: Normal range of motion.  Skin:    General: Skin is warm and dry.  Neurological:     Mental Status: She is alert and oriented to person, place, and time.      ED Treatments / Results  Labs (all labs ordered are listed, but only abnormal results are displayed) Labs Reviewed  URINALYSIS, ROUTINE W REFLEX MICROSCOPIC - Abnormal; Notable for the following components:      Result Value   Color, Urine RED (*)  APPearance TURBID (*)    Glucose, UA   (*)    Value: TEST NOT REPORTED DUE TO COLOR INTERFERENCE OF URINE PIGMENT   Hgb urine dipstick   (*)    Value: TEST NOT REPORTED DUE TO COLOR INTERFERENCE OF URINE PIGMENT   Bilirubin Urine   (*)    Value: TEST NOT REPORTED DUE TO COLOR INTERFERENCE OF URINE PIGMENT   Ketones, ur   (*)    Value: TEST NOT  REPORTED DUE TO COLOR INTERFERENCE OF URINE PIGMENT   Protein, ur   (*)    Value: TEST NOT REPORTED DUE TO COLOR INTERFERENCE OF URINE PIGMENT   Nitrite   (*)    Value: TEST NOT REPORTED DUE TO COLOR INTERFERENCE OF URINE PIGMENT   Leukocytes,Ua   (*)    Value: TEST NOT REPORTED DUE TO COLOR INTERFERENCE OF URINE PIGMENT   All other components within normal limits  CBC WITH DIFFERENTIAL/PLATELET - Abnormal; Notable for the following components:   WBC 3.9 (*)    All other components within normal limits  COMPREHENSIVE METABOLIC PANEL - Abnormal; Notable for the following components:   Sodium 134 (*)    AST 390 (*)    ALT 299 (*)    All other components within normal limits  URINALYSIS, MICROSCOPIC (REFLEX) - Abnormal; Notable for the following components:   Bacteria, UA MANY (*)    All other components within normal limits  URINE CULTURE  PREGNANCY, URINE    EKG None  Radiology Ct Renal Stone Study  Result Date: 11/07/2018 CLINICAL DATA:  38 year old female with a history of gross hematuria EXAM: CT ABDOMEN AND PELVIS WITHOUT CONTRAST TECHNIQUE: Multidetector CT imaging of the abdomen and pelvis was performed following the standard protocol without IV contrast. COMPARISON:  01/09/2018 FINDINGS: Lower chest: No acute abnormality. Hepatobiliary: Unremarkable liver.  Cholecystectomy Pancreas: Unremarkable pancreas Spleen: Unremarkable spleen Adrenals/Urinary Tract: Unremarkable adrenal glands. Right kidney demonstrates punctate nonobstructive stones in the dependent collecting system measuring 1 mm-2 mm. Stone burden is relatively unchanged from the comparison CT. No hydronephrosis. Unremarkable course of the right ureter. Left kidney demonstrates punctate calcifications in the collecting system, measuring 1 mm-2 mm. No hydronephrosis. No perinephric stranding. Unremarkable course of the left ureter. Urinary bladder partially distended with hyperdense material layered at the dependent aspect  compatible with acute blood products. There is gas within the anti dependent aspect of the urinary bladder with mild stranding. No radiopaque stones within the urinary bladder. Stomach/Bowel: Unremarkable stomach. Unremarkable small bowel. Normal appendix. Moderate stool burden. No inflammatory changes adjacent to colon. No obstruction. Vascular/Lymphatic: No atherosclerotic changes.  No adenopathy. Reproductive: Hysterectomy. Other: None Musculoskeletal: No acute displaced fracture. IMPRESSION: Hyperdense fluid layered at the dependent urinary bladder compatible with acute blood products, with uncertain etiology. There is tiny punctate gas within the anti dependent urinary bladder which may be related to recent instrumentation. Bilateral nonobstructing nephrolithiasis with otherwise unremarkable appearance of the kidneys. Electronically Signed   By: Gilmer Mor D.O.   On: 11/07/2018 15:14    Procedures Procedures (including critical care time)  Medications Ordered in ED Medications  fentaNYL (SUBLIMAZE) injection 25 mcg (25 mcg Intravenous Given 11/07/18 1556)     Initial Impression / Assessment and Plan / ED Course  I have reviewed the triage vital signs and the nursing notes.  Pertinent labs & imaging results that were available during my care of the patient were reviewed by me and considered in my medical decision making (see chart for  details).        Patient presenting for evaluation of urinary retention and hematuria.  Physical exam reassuring, she appears nontoxic.  However, I am concerned about her inability to urinate.  Bladder scan shows almost 400 cc in her bladder.  Will place foley.  External exam reassuring, no bleeding or signs of trauma.  Will obtain labs to assess kidney function, hemoglobin, will send urine for testing.  Foley output shows bloody urine.  Labs show elevation in liver enzymes, but are otherwise reassuring.  Hemoglobin stable.  Unknown cause for patient's  elevation of liver enzymes, however as she does not have right upper quadrant abdominal pain, has a history of cholecystectomy, and denies alcohol, Tylenol, or other drug use, will have patient follow-up on an outpatient basis.  CT performed earlier reviewed by radiologist myself, shows what appears to be blood products in the bladder without obvious cause.  Also shows gas, but this is likely due to Foley placement several days ago.  No obvious kidney stone.  No abnormality of the liver noted on the CT.  Will discuss with Dr. Su Hiltoberts, patient's OB/GYN who performed her surgery last year.   Discussed with Dr. Su Hiltoberts' partner, who felt reassured from an ob/gyn perspective, but recommended urology consult.  Discussed with Dr. Alvester MorinBell from urology, who recommended OP f/u next week. Recommended d/c with foley.   Discussed findings and plan with pt. discussed importance of follow-up with urology as well as PCP for further evaluation of her liver enzymes.  Encourage cessation of alcohol and Tylenol until cause has been identified.  At this time, patient appears safe for discharge.  Return precautions given.  Patient states she understands agrees plan.  Final Clinical Impressions(s) / ED Diagnoses   Final diagnoses:  Urinary retention  Hematuria, unspecified type  Blood clot in bladder  Elevated LFTs    ED Discharge Orders    None       Alveria ApleyCaccavale, Velvie Thomaston, PA-C 11/07/18 1733    Cathren LaineSteinl, Kevin, MD 11/08/18 [phone]

## 2018-11-08 ENCOUNTER — Other Ambulatory Visit: Payer: Self-pay

## 2018-11-08 ENCOUNTER — Emergency Department (HOSPITAL_BASED_OUTPATIENT_CLINIC_OR_DEPARTMENT_OTHER)
Admission: EM | Admit: 2018-11-08 | Discharge: 2018-11-09 | Disposition: A | Discharge status: Home / Self Care | Payer: BLUE CROSS/BLUE SHIELD | Attending: Emergency Medicine | Admitting: Emergency Medicine

## 2018-11-08 ENCOUNTER — Encounter (HOSPITAL_BASED_OUTPATIENT_CLINIC_OR_DEPARTMENT_OTHER): Payer: Self-pay | Admitting: Emergency Medicine

## 2018-11-08 DIAGNOSIS — Y69 Unspecified misadventure during surgical and medical care: Secondary | ICD-10-CM | POA: Insufficient documentation

## 2018-11-08 DIAGNOSIS — Z950 Presence of cardiac pacemaker: Secondary | ICD-10-CM | POA: Insufficient documentation

## 2018-11-08 DIAGNOSIS — T839XXA Unspecified complication of genitourinary prosthetic device, implant and graft, initial encounter: Secondary | ICD-10-CM

## 2018-11-08 DIAGNOSIS — R7989 Other specified abnormal findings of blood chemistry: Secondary | ICD-10-CM | POA: Diagnosis not present

## 2018-11-08 DIAGNOSIS — Z79899 Other long term (current) drug therapy: Secondary | ICD-10-CM | POA: Diagnosis not present

## 2018-11-08 DIAGNOSIS — Z7982 Long term (current) use of aspirin: Secondary | ICD-10-CM | POA: Diagnosis not present

## 2018-11-08 DIAGNOSIS — T83091A Other mechanical complication of indwelling urethral catheter, initial encounter: Secondary | ICD-10-CM | POA: Diagnosis not present

## 2018-11-08 DIAGNOSIS — R339 Retention of urine, unspecified: Secondary | ICD-10-CM | POA: Diagnosis present

## 2018-11-08 LAB — URINE CULTURE: Culture: >=100000 — AB

## 2018-11-08 LAB — CBC WITH DIFFERENTIAL/PLATELET
Abs Immature Granulocytes: 0.02 10*3/uL (ref 0.00–0.07)
Basophils Absolute: 0 10*3/uL (ref 0.0–0.1)
Basophils Relative: 0 %
Eosinophils Absolute: 0.4 10*3/uL (ref 0.0–0.5)
Eosinophils Relative: 8 %
HCT: 39.1 % (ref 36.0–46.0)
Hemoglobin: 12.6 g/dL (ref 12.0–15.0)
Immature Granulocytes: 0 %
Lymphocytes Relative: 43 %
Lymphs Abs: 2.2 10*3/uL (ref 0.7–4.0)
MCH: 30.7 pg (ref 26.0–34.0)
MCHC: 32.2 g/dL (ref 30.0–36.0)
MCV: 95.1 fL (ref 80.0–100.0)
Monocytes Absolute: 0.4 10*3/uL (ref 0.1–1.0)
Monocytes Relative: 8 %
Neutro Abs: 2.1 10*3/uL (ref 1.7–7.7)
Neutrophils Relative %: 41 %
Platelets: 196 10*3/uL (ref 150–400)
RBC: 4.11 MIL/uL (ref 3.87–5.11)
RDW: 12.7 % (ref 11.5–15.5)
WBC: 5.2 10*3/uL (ref 4.0–10.5)
nRBC: 0 % (ref 0.0–0.2)

## 2018-11-08 NOTE — ED Provider Notes (Signed)
[facility] EMERGENCY DEPARTMENT Provider Note   CSN: [id number] Arrival date & time: 11/08/18  2247    History   Chief Complaint Chief Complaint  Patient presents with   Urinary Retention    HPI Carmen [name] is a 38 y.o. female.     Patient is a 38 year old female with past medical history of hypertension, renal calculi, depression, palpitations.  She presents today for evaluation of Foley catheter problems.  She has had UTIs in the past, and recently had gross hematuria.  She was seen by her primary doctor and treated with antibiotics.  She was seen here yesterday for urinary retention.  A Foley catheter was placed and CT scan was obtained.  This showed clots throughout the bladder, but no mass or stones.  Patient to follow-up next week with urology, however this evening she began to feel distended and reports leaking around her catheter.  Patient also with elevated liver functions.  The etiology of this is undetermined and she has outpatient follow-up with GI forthcoming.  She denies to me she is having any fevers, chills, or abdominal pain.  The history is provided by the patient.    Past Medical History:  Diagnosis Date   Anxiety    Costochondritis 09/13/2010   Depression    GERD (gastroesophageal reflux disease)    Headache    migrains   Heart palpitations    long history of    History of chest pain    History of cholecystitis    Laparoscopic cholecystectomy   History of fatigue    History of kidney stones    History of migraine headaches    History of seizure disorder    remote history of seizure activity and was evaluated at [facility] for this in the past  She is presently not on any seizure  medications and has not had any seizures for many years.   History of tilt table evaluation    IMPRESSION: Positive tilt table study with significant cardioinhibitory  response.   Hyperlipidemia    Hypertension    Hypothyroidism    Kidney  stones    history of   Pericarditis 09/13/2010   Presence of permanent cardiac pacemaker    PSVT (paroxysmal supraventricular tachycardia) (HCC)    Sleeping difficulties    Stress    SVD (spontaneous vaginal delivery)    x 1   Syncope and collapse    neurocardiogenic    Patient Active Problem List   Diagnosis Date Noted   Pelvic prolapse 11/17/2017   Incontinence of urine in female 11/14/2017   PSVT (paroxysmal supraventricular tachycardia) (HCC)    Heart palpitations    Syncope and collapse    Sleeping difficulties    Pericarditis 09/13/2010   Hypertension 09/13/2010   Syncope 09/13/2010   Costochondritis 09/13/2010    Past Surgical History:  Procedure Laterality Date   ANTERIOR AND POSTERIOR REPAIR N/A 11/14/2017   Procedure: ANTERIOR (CYSTOCELE) AND POSTERIOR REPAIR (RECTOCELE);  Surgeon: Silverio Lay, MD;  Location: [facility];  Service: Gynecology;  Laterality: N/A;   BILATERAL SALPINGECTOMY Bilateral 11/14/2017   Procedure: BILATERAL SALPINGECTOMY;  Surgeon: Silverio Lay, MD;  Location: [facility];  Service: Gynecology;  Laterality: Bilateral;   BLADDER SUSPENSION Bilateral 11/14/2017   Procedure: TRANSVAGINAL TAPE (TVT) PROCEDURE;  Surgeon: Osborn Coho, MD;  Location: [facility];  Service: Gynecology;  Laterality: Bilateral;   CHOLECYSTECTOMY  11/2007   Cholecystitis - Laparoscopic cholecystectomy with intraoperative  cholangiogram.   CHOLECYSTECTOMY  CYSTOSCOPY N/A 11/14/2017   Procedure: CYSTOSCOPY;  Surgeon: Osborn Cohooberts, Angela, MD;  Location: [facility];  Service: Gynecology;  Laterality: N/A;   PACEMAKER INSERTION  2016   Tilt Table Study  07/17/2005   Positive tilt table study with significant cardioinhibitory  response.   VAGINAL HYSTERECTOMY N/A 11/14/2017   Procedure: HYSTERECTOMY VAGINAL;  Surgeon: Silverio Layivard, Sandra, MD;  Location: [facility];  Service: Gynecology;  Laterality: N/A;   WISDOM TOOTH EXTRACTION       OB History    Gravida  2   Para    1   Term  1   Preterm      AB      Living  1     SAB      TAB      Ectopic      Multiple      Live Births               Home Medications    Prior to Admission medications   Medication Sig Start Date End Date Taking? Authorizing Provider  aspirin EC 81 MG tablet Take 182 mg by mouth daily.    [provider]  BIOTIN PO Take 1 capsule by mouth daily.    [provider]  clonazePAM (KLONOPIN) 1 MG tablet Take 1 mg by mouth 2 (two) times daily as needed for anxiety.    [provider]  fexofenadine (ALLEGRA) 180 MG tablet Take 180 mg by mouth 2 (two) times daily as needed (hives).    [provider]  ibuprofen (ADVIL,MOTRIN) 600 MG tablet Take 1 tablet (600 mg total) by mouth every 6 (six) hours. 11/17/17   Silverio Layivard, Sandra, MD  levothyroxine (SYNTHROID, LEVOTHROID) 50 MCG tablet Take 50 mcg by mouth daily before breakfast.    [provider]  lisdexamfetamine (VYVANSE) 40 MG capsule Take 40 mg by mouth every morning.    [provider]  Magnesium 400 MG CAPS Take 400 mg by mouth daily.    [provider]  metoprolol succinate (TOPROL-XL) 50 MG 24 hr tablet Take 100 mg by mouth daily. Take with or immediately following a meal.    [provider]  Multiple Vitamin (MULTIVITAMIN WITH MINERALS) TABS tablet Take 1 tablet by mouth daily.    [provider]  omeprazole (PRILOSEC) 40 MG capsule Take 40 mg by mouth every other day.     [provider]  ondansetron (ZOFRAN) 4 MG tablet Take 1 tablet (4 mg total) by mouth every 6 (six) hours. 01/09/18   Lurene ShadowPhelps, Erin O, PA-C  Oxcarbazepine (TRILEPTAL) 300 MG tablet Take 300 mg by mouth 2 (two) times daily.    [provider]  oxyCODONE-acetaminophen (PERCOCET/ROXICET) 5-325 MG tablet Take 1-2 tablets by mouth every 6 (six) hours as needed for moderate pain. 11/17/17   Silverio Layivard, Sandra, MD  ranitidine (ZANTAC) 150 MG tablet Take 150 mg by mouth 2  (two) times daily as needed (hives).    [provider]  SUMAtriptan (IMITREX) 100 MG tablet Take 100 mg by mouth every 2 (two) hours as needed for migraine. May repeat in 2 hours if headache persists or recurs.    [provider]  venlafaxine XR (EFFEXOR-XR) 150 MG 24 hr capsule Take 150 mg by mouth daily.    [provider]  XIIDRA 5 % SOLN Place 1 drop into both eyes 2 (two) times daily. 09/26/17   [provider]  zolpidem (AMBIEN) 5 MG tablet Take 1 tablet (5  mg total) by mouth at bedtime as needed for sleep. 11/17/17   Silverio Lay, MD    Family History Family History  Problem Relation Age of Onset   Hypertension Mother    Heart disease Mother    Cancer Mother        breast   Heart attack Father    Hyperlipidemia Father    Hepatitis C Father    Cancer Maternal Aunt        Breast    Social History Social History   Tobacco Use   Smoking status: Never Smoker   Smokeless tobacco: Never Used  Substance Use Topics   Alcohol use: No   Drug use: No     Allergies   Tape; Dilaudid [hydromorphone hcl]; and Quinolones   Review of Systems Review of Systems  All other systems reviewed and are negative.    Physical Exam Updated Vital Signs BP (!) 136/92    Pulse 88    Temp 98 F (36.7 C) (Oral)    Resp 18    Ht  (1.575 m)    Wt 93 kg    LMP  (LMP Unknown)    SpO2 100%    BMI 37.49 kg/m   Physical Exam Vitals signs and nursing note reviewed.  Constitutional:      General: She is not in acute distress.    Appearance: She is well-developed. She is not diaphoretic.  HENT:     Head: Normocephalic and atraumatic.  Neck:     Musculoskeletal: Normal range of motion and neck supple.  Cardiovascular:     Rate and Rhythm: Normal rate and regular rhythm.     Heart sounds: No murmur. No friction rub. No gallop.   Pulmonary:     Effort: Pulmonary effort is normal. No respiratory distress.     Breath sounds: Normal breath  sounds. No wheezing.  Abdominal:     General: Bowel sounds are normal. There is no distension.     Palpations: Abdomen is soft.     Tenderness: There is no abdominal tenderness.  Musculoskeletal: Normal range of motion.  Skin:    General: Skin is warm and dry.  Neurological:     Mental Status: She is alert and oriented to person, place, and time.      ED Treatments / Results  Labs (all labs ordered are listed, but only abnormal results are displayed) Labs Reviewed  COMPREHENSIVE METABOLIC PANEL  HEPATITIS PANEL, ACUTE    EKG None  Radiology Ct Renal Stone Study  Result Date: 11/07/2018 CLINICAL DATA:  38 year old female with a history of gross hematuria EXAM: CT ABDOMEN AND PELVIS WITHOUT CONTRAST TECHNIQUE: Multidetector CT imaging of the abdomen and pelvis was performed following the standard protocol without IV contrast. COMPARISON:  01/09/2018 FINDINGS: Lower chest: No acute abnormality. Hepatobiliary: Unremarkable liver.  Cholecystectomy Pancreas: Unremarkable pancreas Spleen: Unremarkable spleen Adrenals/Urinary Tract: Unremarkable adrenal glands. Right kidney demonstrates punctate nonobstructive stones in the dependent collecting system measuring 1 mm-2 mm. Stone burden is relatively unchanged from the comparison CT. No hydronephrosis. Unremarkable course of the right ureter. Left kidney demonstrates punctate calcifications in the collecting system, measuring 1 mm-2 mm. No hydronephrosis. No perinephric stranding. Unremarkable course of the left ureter. Urinary bladder partially distended with hyperdense material layered at the dependent aspect compatible with acute blood products. There is gas within the anti dependent aspect of the urinary bladder with mild stranding. No radiopaque stones within the urinary bladder. Stomach/Bowel: Unremarkable stomach. Unremarkable small bowel.  Normal appendix. Moderate stool burden. No inflammatory changes adjacent to colon. No obstruction.  Vascular/Lymphatic: No atherosclerotic changes.  No adenopathy. Reproductive: Hysterectomy. Other: None Musculoskeletal: No acute displaced fracture. IMPRESSION: Hyperdense fluid layered at the dependent urinary bladder compatible with acute blood products, with uncertain etiology. There is tiny punctate gas within the anti dependent urinary bladder which may be related to recent instrumentation. Bilateral nonobstructing nephrolithiasis with otherwise unremarkable appearance of the kidneys. Electronically Signed   By: Gilmer Mor D.O.   On: 11/07/2018 15:14    Procedures Procedures (including critical care time)  Medications Ordered in ED Medications - No data to display   Initial Impression / Assessment and Plan / ED Course  I have reviewed the triage vital signs and the nursing notes.  Pertinent labs & imaging results that were available during my care of the patient were reviewed by me and considered in my medical decision making (see chart for details).  Patient's catheter change and is now draining.  She is feeling much better.  Patient's laboratory studies were repeated.  Her AST and ALT which were elevated yesterday are significantly improving.  I am uncertain as to the etiology of this elevation of her LFTs, however an acute hepatitis panel was obtained.  This likely will not be back for many hours.  Patient will be discharged with these results pending.  To follow-up with urology as previously recommended.  She may also need to see a gastroenterologist related to her elevation of LFTs.  Final Clinical Impressions(s) / ED Diagnoses   Final diagnoses:  None    ED Discharge Orders    None       Geoffery Lyons, MD 11/09/18 [phone]

## 2018-11-08 NOTE — ED Triage Notes (Signed)
Pt was seen here yesterday and a foley was placed for urinary retention. Today she had the experience distention, the feeling of urgency. Last time she emptied her bag around 1730 and has a[prrox 100cc in foley bag

## 2018-11-09 ENCOUNTER — Telehealth (HOSPITAL_COMMUNITY): Payer: Self-pay | Admitting: Pharmacist

## 2018-11-09 ENCOUNTER — Telehealth: Payer: Self-pay | Admitting: Emergency Medicine

## 2018-11-09 LAB — COMPREHENSIVE METABOLIC PANEL
ALT: 186 U/L — ABNORMAL HIGH (ref 0–44)
AST: 116 U/L — ABNORMAL HIGH (ref 15–41)
Albumin: 3.8 g/dL (ref 3.5–5.0)
Alkaline Phosphatase: 115 U/L (ref 38–126)
Anion gap: 6 (ref 5–15)
BUN: 7 mg/dL (ref 6–20)
CO2: 23 mmol/L (ref 22–32)
Calcium: 8.6 mg/dL — ABNORMAL LOW (ref 8.9–10.3)
Chloride: 108 mmol/L (ref 98–111)
Creatinine, Ser: 0.8 mg/dL (ref 0.44–1.00)
GFR calc Af Amer: >60 mL/min (ref >60)
GFR calc non Af Amer: >60 mL/min (ref >60)
Glucose, Bld: 83 mg/dL (ref 70–99)
Potassium: 3.9 mmol/L (ref 3.5–5.1)
Sodium: 137 mmol/L (ref 135–145)
Total Bilirubin: 0.4 mg/dL (ref 0.3–1.2)
Total Protein: 6.7 g/dL (ref 6.5–8.1)

## 2018-11-09 MED ORDER — HYDROCODONE-ACETAMINOPHEN 5-325 MG PO TABS
2.0000 | ORAL_TABLET | Freq: Once | ORAL | Status: AC
  Administered 2018-11-09: 2 via ORAL
  Filled 2018-11-09: qty 2

## 2018-11-09 MED ORDER — TRAMADOL HCL 50 MG PO TABS: 50.0000 mg | ORAL_TABLET | Freq: Four times a day (QID) | ORAL | 0 refills | Status: DC | PRN

## 2018-11-09 NOTE — Discharge Instructions (Addendum)
Follow-up with urology on Monday as previously recommended, and return to the ER if symptoms significantly worsen or change.

## 2018-11-09 NOTE — ED Notes (Signed)
ED Provider at bedside. 

## 2018-11-09 NOTE — Progress Notes (Signed)
ED Antimicrobial Stewardship Positive Culture Follow Up   Carmen [name] is an 38 y.o. female who presented to [facility]   Recent Results (from the past 720 hour(s))  Urine culture     Status: Abnormal   Collection Time: 11/07/18  2:40 PM  Result Value Ref Range Status   Specimen Description   Final    URINE, CLEAN CATCH Performed at [facility], [address] [place], [place] [address]    Special Requests   Final    NONE Performed at [facility], [address] [place], [place] [address]    Culture >=100,000 COLONIES/mL VIRIDANS STREPTOCOCCUS (A)  Final   Report Status 11/08/2018 FINAL  Final    []  Treated with  organism resistant to prescribed antimicrobial [x]  Patient discharged originally without antimicrobial agent and treatment is now indicated  New antibiotic prescription: Amoxicillin 500 mg po bid x 7 days  ED Provider: B. Les Pou 11/09/2018, 9:31 AM Clinical Pharmacist [phone]

## 2018-11-09 NOTE — Telephone Encounter (Signed)
Post ED Visit - Positive Culture Follow-up: Unsuccessful Patient Follow-up  Culture assessed and recommendations reviewed by:  []  Enzo Bi, Pharm.D. []  Celedonio Miyamoto, Pharm.D., BCPS AQ-ID []  Garvin Fila, Pharm.D., BCPS []  Georgina Pillion, Pharm.D., BCPS []  Golf, [place].D., BCPS, AAHIVP []  Estella Husk, Pharm.D., BCPS, AAHIVP []  Sherlynn Carbon, PharmD [x]  Berlin Hun, PharmD, BCPS  Positive urine culture  [x]  Patient discharged without antimicrobial prescription and treatment is now indicated []  Organism is resistant to prescribed ED discharge antimicrobial []  Patient with positive blood cultures  Needs new antibiotic: Amoxicillin 500 mg PO BID x seven days Britni Henderly PA  Unable to contact patient after 3 attempts, letter will be sent to address on file  Norm Parcel 11/09/2018, 5:47 PM

## 2018-11-10 LAB — HEPATITIS PANEL, ACUTE
HCV Ab: <0.1 s/co ratio (ref 0.0–0.9)
Hep A IgM: NEGATIVE
Hep B C IgM: NEGATIVE
Hepatitis B Surface Ag: NEGATIVE

## 2019-01-22 ENCOUNTER — Encounter (HOSPITAL_BASED_OUTPATIENT_CLINIC_OR_DEPARTMENT_OTHER): Payer: Self-pay

## 2019-01-22 ENCOUNTER — Emergency Department (HOSPITAL_BASED_OUTPATIENT_CLINIC_OR_DEPARTMENT_OTHER)
Admission: EM | Admit: 2019-01-22 | Discharge: 2019-01-22 | Disposition: A | Discharge status: Home / Self Care | Payer: BC Managed Care – PPO | Attending: Emergency Medicine | Admitting: Emergency Medicine

## 2019-01-22 ENCOUNTER — Other Ambulatory Visit: Payer: Self-pay

## 2019-01-22 DIAGNOSIS — Z7982 Long term (current) use of aspirin: Secondary | ICD-10-CM | POA: Diagnosis not present

## 2019-01-22 DIAGNOSIS — Z79899 Other long term (current) drug therapy: Secondary | ICD-10-CM | POA: Insufficient documentation

## 2019-01-22 DIAGNOSIS — N390 Urinary tract infection, site not specified: Secondary | ICD-10-CM

## 2019-01-22 DIAGNOSIS — I10 Essential (primary) hypertension: Secondary | ICD-10-CM | POA: Insufficient documentation

## 2019-01-22 DIAGNOSIS — E039 Hypothyroidism, unspecified: Secondary | ICD-10-CM | POA: Insufficient documentation

## 2019-01-22 DIAGNOSIS — R339 Retention of urine, unspecified: Secondary | ICD-10-CM | POA: Insufficient documentation

## 2019-01-22 DIAGNOSIS — Z950 Presence of cardiac pacemaker: Secondary | ICD-10-CM | POA: Insufficient documentation

## 2019-01-22 LAB — COMPREHENSIVE METABOLIC PANEL
ALT: 20 U/L (ref 0–44)
AST: 20 U/L (ref 15–41)
Albumin: 4.2 g/dL (ref 3.5–5.0)
Alkaline Phosphatase: 77 U/L (ref 38–126)
Anion gap: 9 (ref 5–15)
BUN: 15 mg/dL (ref 6–20)
CO2: 24 mmol/L (ref 22–32)
Calcium: 9.2 mg/dL (ref 8.9–10.3)
Chloride: 103 mmol/L (ref 98–111)
Creatinine, Ser: 0.88 mg/dL (ref 0.44–1.00)
GFR calc Af Amer: >60 mL/min (ref >60)
GFR calc non Af Amer: >60 mL/min (ref >60)
Glucose, Bld: 98 mg/dL (ref 70–99)
Potassium: 3.9 mmol/L (ref 3.5–5.1)
Sodium: 136 mmol/L (ref 135–145)
Total Bilirubin: 0.2 mg/dL — ABNORMAL LOW (ref 0.3–1.2)
Total Protein: 7 g/dL (ref 6.5–8.1)

## 2019-01-22 LAB — URINALYSIS, ROUTINE W REFLEX MICROSCOPIC
Bilirubin Urine: NEGATIVE
Glucose, UA: NEGATIVE mg/dL
Ketones, ur: NEGATIVE mg/dL
Leukocytes,Ua: NEGATIVE
Nitrite: NEGATIVE
Protein, ur: NEGATIVE mg/dL
Specific Gravity, Urine: <1.005 — ABNORMAL LOW (ref 1.005–1.030)
pH: 6 (ref 5.0–8.0)

## 2019-01-22 LAB — CBC WITH DIFFERENTIAL/PLATELET
Abs Immature Granulocytes: 0.02 10*3/uL (ref 0.00–0.07)
Basophils Absolute: 0 10*3/uL (ref 0.0–0.1)
Basophils Relative: 1 %
Eosinophils Absolute: 0.4 10*3/uL (ref 0.0–0.5)
Eosinophils Relative: 4 %
HCT: 39.6 % (ref 36.0–46.0)
Hemoglobin: 13.5 g/dL (ref 12.0–15.0)
Immature Granulocytes: 0 %
Lymphocytes Relative: 30 %
Lymphs Abs: 2.5 10*3/uL (ref 0.7–4.0)
MCH: 31.5 pg (ref 26.0–34.0)
MCHC: 34.1 g/dL (ref 30.0–36.0)
MCV: 92.5 fL (ref 80.0–100.0)
Monocytes Absolute: 0.7 10*3/uL (ref 0.1–1.0)
Monocytes Relative: 9 %
Neutro Abs: 4.6 10*3/uL (ref 1.7–7.7)
Neutrophils Relative %: 56 %
Platelets: 245 10*3/uL (ref 150–400)
RBC: 4.28 MIL/uL (ref 3.87–5.11)
RDW: 13.2 % (ref 11.5–15.5)
WBC: 8.2 10*3/uL (ref 4.0–10.5)
nRBC: 0 % (ref 0.0–0.2)

## 2019-01-22 LAB — URINALYSIS, MICROSCOPIC (REFLEX)

## 2019-01-22 LAB — PREGNANCY, URINE: Preg Test, Ur: NEGATIVE

## 2019-01-22 MED ORDER — SODIUM CHLORIDE 0.9 % IV BOLUS
1000.0000 mL | Freq: Once | INTRAVENOUS | Status: AC
  Administered 2019-01-22: 22:00:00 1000 mL via INTRAVENOUS

## 2019-01-22 MED ORDER — CEPHALEXIN 500 MG PO CAPS: 500.0000 mg | ORAL_CAPSULE | Freq: Three times a day (TID) | ORAL | 0 refills | Status: DC

## 2019-01-22 MED ORDER — PROMETHAZINE HCL 12.5 MG PO TABS: 12.5000 mg | ORAL_TABLET | Freq: Four times a day (QID) | ORAL | 0 refills | Status: DC | PRN

## 2019-01-22 MED ORDER — PROMETHAZINE HCL 25 MG/ML IJ SOLN
12.5000 mg | Freq: Once | INTRAMUSCULAR | Status: AC
  Administered 2019-01-22: 12.5 mg via INTRAVENOUS

## 2019-01-22 MED ORDER — HYDROCODONE-ACETAMINOPHEN 5-325 MG PO TABS: 2.0000 | ORAL_TABLET | ORAL | 0 refills | Status: DC | PRN

## 2019-01-22 MED ORDER — SODIUM CHLORIDE 0.9 % IV SOLN: INTRAVENOUS | Status: DC | PRN

## 2019-01-22 MED ORDER — SODIUM CHLORIDE 0.9 % IV SOLN
1.0000 g | Freq: Once | INTRAVENOUS | Status: AC
  Administered 2019-01-22: 1 g via INTRAVENOUS
  Filled 2019-01-22: qty 10

## 2019-01-22 MED ORDER — PROMETHAZINE HCL 25 MG/ML IJ SOLN
INTRAMUSCULAR | Status: AC
  Filled 2019-01-22: qty 1

## 2019-01-22 MED ORDER — MORPHINE SULFATE (PF) 4 MG/ML IV SOLN
4.0000 mg | Freq: Once | INTRAVENOUS | Status: AC
  Administered 2019-01-22: 23:00:00 4 mg via INTRAVENOUS
  Filled 2019-01-22: qty 1

## 2019-01-22 MED ORDER — FENTANYL CITRATE (PF) 100 MCG/2ML IJ SOLN
50.0000 ug | Freq: Once | INTRAMUSCULAR | Status: AC
  Administered 2019-01-22: 22:00:00 50 ug via INTRAVENOUS
  Filled 2019-01-22: qty 2

## 2019-01-22 NOTE — ED Notes (Signed)
Pt teaching provided on medications that may cause drowsiness. Pt instructed not to drive or operate heavy machinery while taking the prescribed medication. Pt verbalized understanding.   

## 2019-01-22 NOTE — Discharge Instructions (Signed)
Keep foley in place   Take vicodin for severe pain   Take phenergan for nausea   Take keflex for UTI   See urologist on Monday   Return to ER if your foley is clogged, severe pain, fever, vomiting

## 2019-01-22 NOTE — ED Provider Notes (Signed)
[facility] EMERGENCY DEPARTMENT Provider Note   CSN: [id number] Arrival date & time: 01/22/19  2038     History   Chief Complaint Chief Complaint  Patient presents with  . Urinary Retention    HPI Carmen [name] is a 38 y.o. female history of reflux, recurrent UTIs and urinary retention requiring Foley catheters, here presenting with hematuria, urinary retention.  Patient states that she is on chronic nitrofurantoin over the last several months and she has not had any urinary tract infection .  But she states that prior to that, she has recurrent UTIs and had cystoscopies showed diffuse bladder irritation.  She also states that she had required previous Foley catheters and goes into retention.  She states that since 6 PM today.  She has trouble urinating.  She noticed a lot of pelvic pressure.  She is concerned that she may go into retention.  She denies any back pain or leg pain or numbness or weakness.  Patient states that she has no spinal problems that she knows of or fevers or chills.     The history is provided by the patient.    Past Medical History:  Diagnosis Date  . Anxiety   . Costochondritis 09/13/2010  . Depression   . GERD (gastroesophageal reflux disease)   . Headache    migrains  . Heart palpitations    long history of   . History of chest pain   . History of cholecystitis    Laparoscopic cholecystectomy  . History of fatigue   . History of kidney stones   . History of migraine headaches   . History of seizure disorder    remote history of seizure activity and was [facility] in the past  She is presently not on any seizure  medications and has not had any seizures for many years.  . History of tilt table evaluation    IMPRESSION: Positive tilt table study with significant cardioinhibitory  response.  Marland Kitchen. Hyperlipidemia   . Hypertension   . Hypothyroidism   . Kidney stones    history of  . Pericarditis 09/13/2010  .  Presence of permanent cardiac pacemaker   . PSVT (paroxysmal supraventricular tachycardia) (HCC)   . Sleeping difficulties   . Stress   . SVD (spontaneous vaginal delivery)    x 1  . Syncope and collapse    neurocardiogenic    Patient Active Problem List   Diagnosis Date Noted  . Pelvic prolapse 11/17/2017  . Incontinence of urine in female 11/14/2017  . PSVT (paroxysmal supraventricular tachycardia) (HCC)   . Heart palpitations   . Syncope and collapse   . Sleeping difficulties   . Pericarditis 09/13/2010  . Hypertension 09/13/2010  . Syncope 09/13/2010  . Costochondritis 09/13/2010    Past Surgical History:  Procedure Laterality Date  . ANTERIOR AND POSTERIOR REPAIR N/A 11/14/2017   Procedure: ANTERIOR (CYSTOCELE) AND POSTERIOR REPAIR (RECTOCELE);  Surgeon: Silverio Layivard, Sandra, MD;  Location: [facility];  Service: Gynecology;  Laterality: N/A;  . BILATERAL SALPINGECTOMY Bilateral 11/14/2017   Procedure: BILATERAL SALPINGECTOMY;  Surgeon: Silverio Layivard, Sandra, MD;  Location: [facility];  Service: Gynecology;  Laterality: Bilateral;  . BLADDER SUSPENSION Bilateral 11/14/2017   Procedure: TRANSVAGINAL TAPE (TVT) PROCEDURE;  Surgeon: Osborn Cohooberts, Angela, MD;  Location: [facility];  Service: Gynecology;  Laterality: Bilateral;  . CHOLECYSTECTOMY  11/2007   Cholecystitis - Laparoscopic cholecystectomy with intraoperative  cholangiogram.  . CHOLECYSTECTOMY    . CYSTOSCOPY N/A 11/14/2017  Procedure: CYSTOSCOPY;  Surgeon: Osborn Cohooberts, Angela, MD;  Location: [facility];  Service: Gynecology;  Laterality: N/A;  . PACEMAKER INSERTION  2016  . Tilt Table Study  07/17/2005   Positive tilt table study with significant cardioinhibitory  response.  Marland Kitchen. VAGINAL HYSTERECTOMY N/A 11/14/2017   Procedure: HYSTERECTOMY VAGINAL;  Surgeon: Silverio Layivard, Sandra, MD;  Location: [facility];  Service: Gynecology;  Laterality: N/A;  . WISDOM TOOTH EXTRACTION       OB History    Gravida  2   Para  1   Term  1   Preterm      AB      Living   1     SAB      TAB      Ectopic      Multiple      Live Births               Home Medications    Prior to Admission medications   Medication Sig Start Date End Date Taking? Authorizing Provider  aspirin EC 81 MG tablet Take 182 mg by mouth daily.    [provider]  BIOTIN PO Take 1 capsule by mouth daily.    [provider]  clonazePAM (KLONOPIN) 1 MG tablet Take 1 mg by mouth 2 (two) times daily as needed for anxiety.    [provider]  fexofenadine (ALLEGRA) 180 MG tablet Take 180 mg by mouth 2 (two) times daily as needed (hives).    [provider]  ibuprofen (ADVIL,MOTRIN) 600 MG tablet Take 1 tablet (600 mg total) by mouth every 6 (six) hours. 11/17/17   Silverio Layivard, Sandra, MD  levothyroxine (SYNTHROID, LEVOTHROID) 50 MCG tablet Take 50 mcg by mouth daily before breakfast.    [provider]  lisdexamfetamine (VYVANSE) 40 MG capsule Take 40 mg by mouth every morning.    [provider]  Magnesium 400 MG CAPS Take 400 mg by mouth daily.    [provider]  metoprolol succinate (TOPROL-XL) 50 MG 24 hr tablet Take 100 mg by mouth daily. Take with or immediately following a meal.    [provider]  Multiple Vitamin (MULTIVITAMIN WITH MINERALS) TABS tablet Take 1 tablet by mouth daily.    [provider]  omeprazole (PRILOSEC) 40 MG capsule Take 40 mg by mouth every other day.     [provider]  ondansetron (ZOFRAN) 4 MG tablet Take 1 tablet (4 mg total) by mouth every 6 (six) hours. 01/09/18   Lurene ShadowPhelps, Erin O, PA-C  Oxcarbazepine (TRILEPTAL) 300 MG tablet Take 300 mg by mouth 2 (two) times daily.    [provider]  oxyCODONE-acetaminophen (PERCOCET/ROXICET) 5-325 MG tablet Take 1-2 tablets by mouth every 6 (six) hours as needed for moderate pain. 11/17/17   Silverio Layivard, Sandra, MD  ranitidine (ZANTAC) 150 MG tablet Take 150 mg by mouth 2 (two) times daily as needed (hives).    [provider]  SUMAtriptan (IMITREX) 100 MG tablet Take 100 mg by mouth every 2 (two) hours as needed for migraine. May repeat in 2 hours if headache persists or recurs.    [provider]  traMADol (ULTRAM) 50 MG tablet Take 1 tablet (50 mg total) by mouth every 6 (six) hours as needed. 11/09/18   Geoffery Lyonselo, Douglas, MD  venlafaxine XR (EFFEXOR-XR) 150 MG 24 hr capsule Take 150 mg by mouth daily.    [provider]  XIIDRA 5 % SOLN Place 1 drop into both eyes  2 (two) times daily. 09/26/17   [provider]  zolpidem (AMBIEN) 5 MG tablet Take 1 tablet (5 mg total) by mouth at bedtime as needed for sleep. 11/17/17   Silverio Layivard, Sandra, MD    Family History Family History  Problem Relation Age of Onset  . Hypertension Mother   . Heart disease Mother   . Cancer Mother        breast  . Heart attack Father   . Hyperlipidemia Father   . Hepatitis C Father   . Cancer Maternal Aunt        Breast    Social History Social History   Tobacco Use  . Smoking status: Never Smoker  . Smokeless tobacco: Never Used  Substance Use Topics  . Alcohol use: No  . Drug use: No     Allergies   Tape, Dilaudid [hydromorphone hcl], and Quinolones   Review of Systems Review of Systems  Genitourinary: Positive for difficulty urinating.  All other systems reviewed and are negative.    Physical Exam Updated Vital Signs BP (!) 138/107 (BP Location: Left Arm)   Pulse 84   Temp 98.2 F (36.8 C) (Oral)   Resp 20   Ht 5\' 2"  (1.575 m)   Wt 93.4 kg   LMP  (LMP Unknown)   SpO2 100%   BMI 37.68 kg/m   Physical Exam Vitals signs and nursing note reviewed.  Constitutional:      Comments: Uncomfortable   HENT:     Head: Normocephalic.     Nose: Nose normal.     Mouth/Throat:     Mouth: Mucous membranes are moist.  Eyes:     Extraocular Movements: Extraocular movements intact.     Pupils: Pupils are equal, round, and reactive to light.  Neck:     Musculoskeletal: Normal  range of motion.  Cardiovascular:     Rate and Rhythm: Normal rate and regular rhythm.     Pulses: Normal pulses.     Heart sounds: Normal heart sounds.  Pulmonary:     Effort: Pulmonary effort is normal.     Breath sounds: Normal breath sounds.  Abdominal:     General: Abdomen is flat.     Comments: + suprapubic tenderness, no CVAT   Musculoskeletal: Normal range of motion.     Comments: No spinal tenderness. Nl strength and sensation bilateral lower extremities. Nl reflexes bilaterally, no saddle anesthesia   Skin:    General: Skin is warm.     Capillary Refill: Capillary refill takes less than 2 seconds.  Neurological:     General: No focal deficit present.     Mental Status: She is oriented to person, place, and time.  Psychiatric:        Mood and Affect: Mood normal.        Behavior: Behavior normal.      ED Treatments / Results  Labs (all labs ordered are listed, but only abnormal results are displayed) Labs Reviewed  PREGNANCY, URINE  URINALYSIS, ROUTINE W REFLEX MICROSCOPIC  CBC WITH DIFFERENTIAL/PLATELET  COMPREHENSIVE METABOLIC PANEL    EKG None  Radiology No results found.  Procedures Procedures (including critical care time)  Medications Ordered in ED Medications  sodium chloride 0.9 % bolus 1,000 mL (has no administration in time range)  fentaNYL (SUBLIMAZE) injection 50 mcg (has no administration in time range)     Initial Impression / Assessment and Plan / ED Course  I have reviewed the triage vital signs and the  nursing notes.  Pertinent labs & imaging results that were available during my care of the patient were reviewed by me and considered in my medical decision making (see chart for details).        Carmen [name] is a 38 y.o. female history urinary retention here presenting with urinary retention.  Bladder scan showed 360 cc in her bladder.  This is a recurrent problem and she has no back pain and no neuro deficits in the lower  extremities. I do not think this is a cauda equina syndrome.  I think this is likely recurrent UTI or secondary to her hematuria.  Will Place Foley and get CBC and chemistry.  11:25 PM  Patient's foley came out 500 cc. UA showed many bacteria but no blood. Kidney function unremarkable. Will dc home with foley and pain meds and keflex. She has urology follow up   Final Clinical Impressions(s) / ED Diagnoses   Final diagnoses:  None    ED Discharge Orders    None       Drenda Freeze, MD 01/22/19 2329

## 2019-01-22 NOTE — ED Triage Notes (Signed)
Pt c/o hematuria x 2 days-urinary retention x today-pt states she has been having urinary issues since April and has been on abx since then with a foley cath placed and removed-pt NAD-steady gait

## 2019-01-22 NOTE — ED Notes (Signed)
Pt vomiting after Morphine. States Zofran gives her a headache. To inform MD

## 2019-01-23 ENCOUNTER — Telehealth (HOSPITAL_BASED_OUTPATIENT_CLINIC_OR_DEPARTMENT_OTHER): Payer: Self-pay | Admitting: Emergency Medicine

## 2019-01-23 MED ORDER — HYDROCODONE-ACETAMINOPHEN 5-325 MG PO TABS: 2.0000 | ORAL_TABLET | ORAL | 0 refills | Status: DC | PRN

## 2019-01-23 NOTE — Telephone Encounter (Signed)
[facility] says that they did not receive pt's rx from yesterday.  Have called in the keflex and pheneragn.  Will re-prescribe the hydrocodone.

## 2019-06-15 ENCOUNTER — Other Ambulatory Visit: Payer: Self-pay

## 2019-06-15 ENCOUNTER — Emergency Department (HOSPITAL_BASED_OUTPATIENT_CLINIC_OR_DEPARTMENT_OTHER): Payer: Medicaid Other

## 2019-06-15 ENCOUNTER — Inpatient Hospital Stay (HOSPITAL_BASED_OUTPATIENT_CLINIC_OR_DEPARTMENT_OTHER)
Admission: EM | Admit: 2019-06-15 | Discharge: 2019-06-18 | DRG: 069 | Disposition: A | Discharge status: Home / Self Care | Payer: Medicaid Other | Attending: Neurology | Admitting: Neurology

## 2019-06-15 ENCOUNTER — Encounter (HOSPITAL_BASED_OUTPATIENT_CLINIC_OR_DEPARTMENT_OTHER): Payer: Self-pay

## 2019-06-15 ENCOUNTER — Inpatient Hospital Stay (HOSPITAL_COMMUNITY): Payer: Medicaid Other

## 2019-06-15 DIAGNOSIS — K219 Gastro-esophageal reflux disease without esophagitis: Secondary | ICD-10-CM | POA: Diagnosis present

## 2019-06-15 DIAGNOSIS — Z6836 Body mass index (BMI) 36.0-36.9, adult: Secondary | ICD-10-CM

## 2019-06-15 DIAGNOSIS — Z885 Allergy status to narcotic agent status: Secondary | ICD-10-CM | POA: Diagnosis not present

## 2019-06-15 DIAGNOSIS — F419 Anxiety disorder, unspecified: Secondary | ICD-10-CM | POA: Diagnosis present

## 2019-06-15 DIAGNOSIS — Z950 Presence of cardiac pacemaker: Secondary | ICD-10-CM | POA: Diagnosis not present

## 2019-06-15 DIAGNOSIS — R471 Dysarthria and anarthria: Secondary | ICD-10-CM | POA: Diagnosis present

## 2019-06-15 DIAGNOSIS — Z7982 Long term (current) use of aspirin: Secondary | ICD-10-CM

## 2019-06-15 DIAGNOSIS — G8191 Hemiplegia, unspecified affecting right dominant side: Secondary | ICD-10-CM | POA: Diagnosis present

## 2019-06-15 DIAGNOSIS — E669 Obesity, unspecified: Secondary | ICD-10-CM | POA: Diagnosis present

## 2019-06-15 DIAGNOSIS — Z79890 Hormone replacement therapy (postmenopausal): Secondary | ICD-10-CM | POA: Diagnosis not present

## 2019-06-15 DIAGNOSIS — E785 Hyperlipidemia, unspecified: Secondary | ICD-10-CM | POA: Diagnosis present

## 2019-06-15 DIAGNOSIS — I10 Essential (primary) hypertension: Secondary | ICD-10-CM | POA: Diagnosis present

## 2019-06-15 DIAGNOSIS — Z803 Family history of malignant neoplasm of breast: Secondary | ICD-10-CM | POA: Diagnosis not present

## 2019-06-15 DIAGNOSIS — Z23 Encounter for immunization: Secondary | ICD-10-CM

## 2019-06-15 DIAGNOSIS — Z8249 Family history of ischemic heart disease and other diseases of the circulatory system: Secondary | ICD-10-CM | POA: Diagnosis not present

## 2019-06-15 DIAGNOSIS — R29706 NIHSS score 6: Secondary | ICD-10-CM | POA: Diagnosis present

## 2019-06-15 DIAGNOSIS — Z91048 Other nonmedicinal substance allergy status: Secondary | ICD-10-CM | POA: Diagnosis not present

## 2019-06-15 DIAGNOSIS — R55 Syncope and collapse: Secondary | ICD-10-CM | POA: Diagnosis present

## 2019-06-15 DIAGNOSIS — G4733 Obstructive sleep apnea (adult) (pediatric): Secondary | ICD-10-CM | POA: Diagnosis present

## 2019-06-15 DIAGNOSIS — F449 Dissociative and conversion disorder, unspecified: Secondary | ICD-10-CM | POA: Diagnosis present

## 2019-06-15 DIAGNOSIS — I639 Cerebral infarction, unspecified: Secondary | ICD-10-CM

## 2019-06-15 DIAGNOSIS — W19XXXA Unspecified fall, initial encounter: Secondary | ICD-10-CM

## 2019-06-15 DIAGNOSIS — G459 Transient cerebral ischemic attack, unspecified: Secondary | ICD-10-CM | POA: Diagnosis present

## 2019-06-15 DIAGNOSIS — Z9282 Status post administration of tPA (rtPA) in a different facility within the last 24 hours prior to admission to current facility: Secondary | ICD-10-CM

## 2019-06-15 DIAGNOSIS — Z8349 Family history of other endocrine, nutritional and metabolic diseases: Secondary | ICD-10-CM | POA: Diagnosis not present

## 2019-06-15 DIAGNOSIS — I6389 Other cerebral infarction: Secondary | ICD-10-CM | POA: Diagnosis not present

## 2019-06-15 DIAGNOSIS — E119 Type 2 diabetes mellitus without complications: Secondary | ICD-10-CM | POA: Diagnosis present

## 2019-06-15 DIAGNOSIS — R2990 Unspecified symptoms and signs involving the nervous system: Secondary | ICD-10-CM | POA: Diagnosis present

## 2019-06-15 DIAGNOSIS — Z20828 Contact with and (suspected) exposure to other viral communicable diseases: Secondary | ICD-10-CM | POA: Diagnosis present

## 2019-06-15 DIAGNOSIS — E039 Hypothyroidism, unspecified: Secondary | ICD-10-CM | POA: Diagnosis present

## 2019-06-15 DIAGNOSIS — Z888 Allergy status to other drugs, medicaments and biological substances status: Secondary | ICD-10-CM

## 2019-06-15 DIAGNOSIS — I6300 Cerebral infarction due to thrombosis of unspecified precerebral artery: Secondary | ICD-10-CM | POA: Diagnosis not present

## 2019-06-15 DIAGNOSIS — G43109 Migraine with aura, not intractable, without status migrainosus: Secondary | ICD-10-CM | POA: Diagnosis present

## 2019-06-15 HISTORY — DX: Syncope and collapse: R55

## 2019-06-15 HISTORY — DX: Cerebral infarction, unspecified: I63.9

## 2019-06-15 LAB — DIFFERENTIAL
Abs Immature Granulocytes: 0.01 10*3/uL (ref 0.00–0.07)
Basophils Absolute: 0 10*3/uL (ref 0.0–0.1)
Basophils Relative: 1 %
Eosinophils Absolute: 0.2 10*3/uL (ref 0.0–0.5)
Eosinophils Relative: 3 %
Immature Granulocytes: 0 %
Lymphocytes Relative: 27 %
Lymphs Abs: 1.7 10*3/uL (ref 0.7–4.0)
Monocytes Absolute: 0.3 10*3/uL (ref 0.1–1.0)
Monocytes Relative: 5 %
Neutro Abs: 3.9 10*3/uL (ref 1.7–7.7)
Neutrophils Relative %: 64 %

## 2019-06-15 LAB — CBC
HCT: 42.3 % (ref 36.0–46.0)
Hemoglobin: 14.3 g/dL (ref 12.0–15.0)
MCH: 31.2 pg (ref 26.0–34.0)
MCHC: 33.8 g/dL (ref 30.0–36.0)
MCV: 92.4 fL (ref 80.0–100.0)
Platelets: 263 10*3/uL (ref 150–400)
RBC: 4.58 MIL/uL (ref 3.87–5.11)
RDW: 12.7 % (ref 11.5–15.5)
WBC: 6.1 10*3/uL (ref 4.0–10.5)
nRBC: 0 % (ref 0.0–0.2)

## 2019-06-15 LAB — COMPREHENSIVE METABOLIC PANEL
ALT: 22 U/L (ref 0–44)
AST: 20 U/L (ref 15–41)
Albumin: 4.4 g/dL (ref 3.5–5.0)
Alkaline Phosphatase: 66 U/L (ref 38–126)
Anion gap: 7 (ref 5–15)
BUN: 12 mg/dL (ref 6–20)
CO2: 22 mmol/L (ref 22–32)
Calcium: 9.1 mg/dL (ref 8.9–10.3)
Chloride: 110 mmol/L (ref 98–111)
Creatinine, Ser: 0.78 mg/dL (ref 0.44–1.00)
GFR calc Af Amer: >60 mL/min (ref >60)
GFR calc non Af Amer: >60 mL/min (ref >60)
Glucose, Bld: 94 mg/dL (ref 70–99)
Potassium: 3.8 mmol/L (ref 3.5–5.1)
Sodium: 139 mmol/L (ref 135–145)
Total Bilirubin: 0.4 mg/dL (ref 0.3–1.2)
Total Protein: 7.6 g/dL (ref 6.5–8.1)

## 2019-06-15 LAB — APTT: aPTT: 27 seconds (ref 24–36)

## 2019-06-15 LAB — PROTIME-INR
INR: 1 (ref 0.8–1.2)
Prothrombin Time: 12.9 seconds (ref 11.4–15.2)

## 2019-06-15 LAB — CBG MONITORING, ED: Glucose-Capillary: 82 mg/dL (ref 70–99)

## 2019-06-15 LAB — MRSA PCR SCREENING: MRSA by PCR: NEGATIVE

## 2019-06-15 LAB — ETHANOL: Alcohol, Ethyl (B): <10 mg/dL (ref <10)

## 2019-06-15 MED ORDER — DIPHENHYDRAMINE HCL 25 MG PO CAPS
25.0000 mg | ORAL_CAPSULE | Freq: Once | ORAL | Status: AC
  Administered 2019-06-16: 25 mg via ORAL
  Filled 2019-06-15: qty 1

## 2019-06-15 MED ORDER — VENLAFAXINE HCL ER 75 MG PO CP24
150.0000 mg | ORAL_CAPSULE | Freq: Every day | ORAL | Status: DC
  Administered 2019-06-16 – 2019-06-18 (×3): 150 mg via ORAL
  Filled 2019-06-15 (×2): qty 2
  Filled 2019-06-15: qty 1

## 2019-06-15 MED ORDER — ACETAMINOPHEN 650 MG RE SUPP: 650.0000 mg | RECTAL | Status: DC | PRN

## 2019-06-15 MED ORDER — ACETAMINOPHEN 325 MG PO TABS
650.0000 mg | ORAL_TABLET | ORAL | Status: DC | PRN
  Administered 2019-06-16 – 2019-06-17 (×4): 650 mg via ORAL
  Filled 2019-06-15 (×4): qty 2

## 2019-06-15 MED ORDER — OXCARBAZEPINE 300 MG PO TABS: 300.0000 mg | ORAL_TABLET | Freq: Two times a day (BID) | ORAL | Status: DC

## 2019-06-15 MED ORDER — CLEVIDIPINE BUTYRATE 0.5 MG/ML IV EMUL: 0.0000 mg/h | INTRAVENOUS | Status: DC

## 2019-06-15 MED ORDER — PROMETHAZINE HCL 25 MG/ML IJ SOLN
25.0000 mg | Freq: Once | INTRAMUSCULAR | Status: AC
  Administered 2019-06-16: 25 mg via INTRAVENOUS
  Filled 2019-06-15: qty 1

## 2019-06-15 MED ORDER — ACETAMINOPHEN 160 MG/5ML PO SOLN: 650.0000 mg | ORAL | Status: DC | PRN

## 2019-06-15 MED ORDER — STROKE: EARLY STAGES OF RECOVERY BOOK
Freq: Once | Status: AC
  Filled 2019-06-15: qty 1

## 2019-06-15 MED ORDER — PANTOPRAZOLE SODIUM 40 MG IV SOLR
40.0000 mg | Freq: Every day | INTRAVENOUS | Status: DC
  Administered 2019-06-16 (×2): 40 mg via INTRAVENOUS
  Filled 2019-06-15 (×2): qty 40

## 2019-06-15 MED ORDER — METOPROLOL SUCCINATE ER 100 MG PO TB24
100.0000 mg | ORAL_TABLET | Freq: Every day | ORAL | Status: DC
  Administered 2019-06-16 – 2019-06-18 (×3): 100 mg via ORAL
  Filled 2019-06-15: qty 1
  Filled 2019-06-15: qty 2
  Filled 2019-06-15: qty 1

## 2019-06-15 MED ORDER — ALTEPLASE (STROKE) FULL DOSE INFUSION
0.9000 mg/kg | Freq: Once | INTRAVENOUS | Status: AC
  Administered 2019-06-15: 82.4 mg via INTRAVENOUS
  Filled 2019-06-15 (×2): qty 100

## 2019-06-15 MED ORDER — LIFITEGRAST 5 % OP SOLN: 1.0000 [drp] | Freq: Two times a day (BID) | OPHTHALMIC | Status: DC

## 2019-06-15 MED ORDER — SODIUM CHLORIDE 0.9 % IV SOLN
50.0000 mL | Freq: Once | INTRAVENOUS | Status: AC
  Administered 2019-06-15: 1000 mL via INTRAVENOUS

## 2019-06-15 MED ORDER — ZOLPIDEM TARTRATE 5 MG PO TABS
5.0000 mg | ORAL_TABLET | Freq: Every evening | ORAL | Status: DC | PRN
  Administered 2019-06-16 – 2019-06-17 (×2): 5 mg via ORAL
  Filled 2019-06-15 (×2): qty 1

## 2019-06-15 MED ORDER — IOHEXOL 350 MG/ML SOLN
100.0000 mL | Freq: Once | INTRAVENOUS | Status: AC | PRN
  Administered 2019-06-15: 100 mL via INTRAVENOUS

## 2019-06-15 MED ORDER — LEVOTHYROXINE SODIUM 50 MCG PO TABS
50.0000 ug | ORAL_TABLET | Freq: Every day | ORAL | Status: DC
  Administered 2019-06-16 – 2019-06-18 (×3): 50 ug via ORAL
  Filled 2019-06-15 (×3): qty 1

## 2019-06-15 MED ORDER — SENNOSIDES-DOCUSATE SODIUM 8.6-50 MG PO TABS: 1.0000 | ORAL_TABLET | Freq: Every evening | ORAL | Status: DC | PRN

## 2019-06-15 MED ORDER — SODIUM CHLORIDE 0.9 % IV SOLN: INTRAVENOUS | Status: DC

## 2019-06-15 NOTE — ED Notes (Signed)
Pt speech continues 'to improve , right facial droop continues  but right lower lip has movement with talking. Also speech is different during certain circumstances and to whom she is speaking to

## 2019-06-15 NOTE — ED Notes (Signed)
Patient transported to CT 

## 2019-06-15 NOTE — ED Notes (Signed)
CBG 85

## 2019-06-15 NOTE — ED Notes (Signed)
ED Provider at bedside. 

## 2019-06-15 NOTE — ED Provider Notes (Signed)
[facility] EMERGENCY DEPARTMENT Provider Note   CSN: [id number] Arrival date & time: 06/15/19  1828     History Chief Complaint  Patient presents with  . Loss of Consciousness    Carmen [name] is a 38 y.o. female.  Patient brought in by POV.  Patient had 545 had a witnessed event with her son there.  Where she collapsed.  Complaint of headache and also had difficulty speaking had right facial droop.  Patient has a history of migraines but no history of complicated migraines.  Patient states she is never had any migraines doing thing like this before.  Code stroke via teleneurology was activated upon patient's arrival.  CT head done immediately no acute findings.        Past Medical History:  Diagnosis Date  . Anxiety   . Costochondritis 09/13/2010  . Depression   . GERD (gastroesophageal reflux disease)   . Headache    migrains  . Heart palpitations    long history of   . History of chest pain   . History of cholecystitis    Laparoscopic cholecystectomy  . History of fatigue   . History of kidney stones   . History of migraine headaches   . History of seizure disorder    remote history of seizure activity and was evaluated at [facility] for this in the past  She is presently not on any seizure  medications and has not had any seizures for many years.  . History of tilt table evaluation    IMPRESSION: Positive tilt table study with significant cardioinhibitory  response.  Marland Kitchen Hyperlipidemia   . Hypertension   . Hypothyroidism   . Kidney stones    history of  . Neurocardiogenic syncope   . Pericarditis 09/13/2010  . Presence of permanent cardiac pacemaker   . PSVT (paroxysmal supraventricular tachycardia) (HCC)   . Sleeping difficulties   . Stress   . SVD (spontaneous vaginal delivery)    x 1  . Syncope and collapse    neurocardiogenic    Patient Active Problem List   Diagnosis Date Noted  . Pelvic prolapse 11/17/2017  . Incontinence of urine in  female 11/14/2017  . PSVT (paroxysmal supraventricular tachycardia) (HCC)   . Heart palpitations   . Syncope and collapse   . Sleeping difficulties   . Pericarditis 09/13/2010  . Hypertension 09/13/2010  . Syncope 09/13/2010  . Costochondritis 09/13/2010    Past Surgical History:  Procedure Laterality Date  . ANTERIOR AND POSTERIOR REPAIR N/A 11/14/2017   Procedure: ANTERIOR (CYSTOCELE) AND POSTERIOR REPAIR (RECTOCELE);  Surgeon: Silverio Lay, MD;  Location: [facility] ORS;  Service: Gynecology;  Laterality: N/A;  . BILATERAL SALPINGECTOMY Bilateral 11/14/2017   Procedure: BILATERAL SALPINGECTOMY;  Surgeon: Silverio Lay, MD;  Location: [facility] ORS;  Service: Gynecology;  Laterality: Bilateral;  . BLADDER SUSPENSION Bilateral 11/14/2017   Procedure: TRANSVAGINAL TAPE (TVT) PROCEDURE;  Surgeon: Osborn Coho, MD;  Location: [facility] ORS;  Service: Gynecology;  Laterality: Bilateral;  . CHOLECYSTECTOMY  11/2007   Cholecystitis - Laparoscopic cholecystectomy with intraoperative  cholangiogram.  . CHOLECYSTECTOMY    . CYSTOSCOPY N/A 11/14/2017   Procedure: CYSTOSCOPY;  Surgeon: Osborn Coho, MD;  Location: [facility] ORS;  Service: Gynecology;  Laterality: N/A;  . PACEMAKER INSERTION  2016  . Tilt Table Study  07/17/2005   Positive tilt table study with significant cardioinhibitory  response.  Marland Kitchen VAGINAL HYSTERECTOMY N/A 11/14/2017   Procedure: HYSTERECTOMY VAGINAL;  Surgeon: Silverio Lay, MD;  Location: [facility]  ORS;  Service: Gynecology;  Laterality: N/A;  . WISDOM TOOTH EXTRACTION       OB History    Gravida  2   Para  1   Term  1   Preterm      AB      Living  1     SAB      TAB      Ectopic      Multiple      Live Births              Family History  Problem Relation Age of Onset  . Hypertension Mother   . Heart disease Mother   . Cancer Mother        breast  . Heart attack Father   . Hyperlipidemia Father   . Hepatitis C Father   . Cancer Maternal Aunt        Breast     Social History   Tobacco Use  . Smoking status: Never Smoker  . Smokeless tobacco: Never Used  Substance Use Topics  . Alcohol use: Yes    Comment: rare  . Drug use: No    Home Medications Prior to Admission medications   Medication Sig Start Date End Date Taking? Authorizing Provider  aspirin EC 81 MG tablet Take 182 mg by mouth daily.    [provider]  BIOTIN PO Take 1 capsule by mouth daily.    [provider]  cephALEXin (KEFLEX) 500 MG capsule Take 1 capsule (500 mg total) by mouth 3 (three) times daily. 01/22/19   Charlynne Pander, MD  clonazePAM (KLONOPIN) 1 MG tablet Take 1 mg by mouth 2 (two) times daily as needed for anxiety.    [provider]  fexofenadine (ALLEGRA) 180 MG tablet Take 180 mg by mouth 2 (two) times daily as needed (hives).    [provider]  HYDROcodone-acetaminophen (NORCO/VICODIN) 5-325 MG tablet Take 2 tablets by mouth every 4 (four) hours as needed. 01/23/19   Rolan Bucco, MD  ibuprofen (ADVIL,MOTRIN) 600 MG tablet Take 1 tablet (600 mg total) by mouth every 6 (six) hours. 11/17/17   Silverio Lay, MD  levothyroxine (SYNTHROID, LEVOTHROID) 50 MCG tablet Take 50 mcg by mouth daily before breakfast.    [provider]  lisdexamfetamine (VYVANSE) 40 MG capsule Take 40 mg by mouth every morning.    [provider]  Magnesium 400 MG CAPS Take 400 mg by mouth daily.    [provider]  metoprolol succinate (TOPROL-XL) 50 MG 24 hr tablet Take 100 mg by mouth daily. Take with or immediately following a meal.    [provider]  Multiple Vitamin (MULTIVITAMIN WITH MINERALS) TABS tablet Take 1 tablet by mouth daily.    [provider]  omeprazole (PRILOSEC) 40 MG capsule Take 40 mg by mouth every other day.     [provider]  ondansetron (ZOFRAN) 4 MG tablet Take 1 tablet (4 mg total) by mouth every 6 (six) hours. 01/09/18   Lurene Shadow, PA-C  Oxcarbazepine  (TRILEPTAL) 300 MG tablet Take 300 mg by mouth 2 (two) times daily.    [provider]  oxyCODONE-acetaminophen (PERCOCET/ROXICET) 5-325 MG tablet Take 1-2 tablets by mouth every 6 (six) hours as needed for moderate pain. 11/17/17   Silverio Lay, MD  promethazine (PHENERGAN) 12.5 MG tablet Take 1 tablet (12.5 mg total) by mouth every 6 (six) hours as needed for nausea or vomiting. 01/22/19   Chaney Malling  Hsienta, MD  ranitidine (ZANTAC) 150 MG tablet Take 150 mg by mouth 2 (two) times daily as needed (hives).    [provider]  SUMAtriptan (IMITREX) 100 MG tablet Take 100 mg by mouth every 2 (two) hours as needed for migraine. May repeat in 2 hours if headache persists or recurs.    [provider]  traMADol (ULTRAM) 50 MG tablet Take 1 tablet (50 mg total) by mouth every 6 (six) hours as needed. 11/09/18   Geoffery Lyonselo, Douglas, MD  venlafaxine XR (EFFEXOR-XR) 150 MG 24 hr capsule Take 150 mg by mouth daily.    [provider]  XIIDRA 5 % SOLN Place 1 drop into both eyes 2 (two) times daily. 09/26/17   [provider]  zolpidem (AMBIEN) 5 MG tablet Take 1 tablet (5 mg total) by mouth at bedtime as needed for sleep. 11/17/17   Silverio Layivard, Sandra, MD    Allergies    Tape, Dilaudid [hydromorphone hcl], and Quinolones  Review of Systems   Review of Systems  Constitutional: Negative for chills and fever.  HENT: Negative for congestion, rhinorrhea and sore throat.   Eyes: Negative for visual disturbance.  Respiratory: Negative for cough and shortness of breath.   Cardiovascular: Negative for chest pain and leg swelling.  Gastrointestinal: Negative for abdominal pain, diarrhea, nausea and vomiting.  Genitourinary: Negative for dysuria.  Musculoskeletal: Negative for back pain and neck pain.  Skin: Negative for rash.  Neurological: Positive for facial asymmetry, speech difficulty, weakness, numbness and headaches. Negative for dizziness, seizures and light-headedness.    Hematological: Does not bruise/bleed easily.  Psychiatric/Behavioral: Negative for confusion.    Physical Exam Updated Vital Signs BP (!) 119/94   Pulse 81   Resp 18   Wt 91.6 kg   LMP  (LMP Unknown)   SpO2 100%   BMI 36.95 kg/m   Physical Exam Vitals and nursing note reviewed.  Constitutional:      General: She is not in acute distress.    Appearance: Normal appearance. She is well-developed.  HENT:     Head: Normocephalic and atraumatic.  Eyes:     Conjunctiva/sclera: Conjunctivae normal.     Pupils: Pupils are equal, round, and reactive to light.     Comments: Patient with difficulty having her eyes track to the left side.  Cardiovascular:     Rate and Rhythm: Normal rate and regular rhythm.     Heart sounds: No murmur.  Pulmonary:     Effort: Pulmonary effort is normal. No respiratory distress.     Breath sounds: Normal breath sounds.  Abdominal:     Palpations: Abdomen is soft.     Tenderness: There is no abdominal tenderness.  Musculoskeletal:        General: No swelling. Normal range of motion.     Cervical back: Normal range of motion and neck supple.  Skin:    General: Skin is warm and dry.     Capillary Refill: Capillary refill takes less than 2 seconds.  Neurological:     Mental Status: She is alert.     Cranial Nerves: Cranial nerve deficit present.     Sensory: Sensory deficit present.     Motor: Weakness present.     Comments: With right facial droop.  Some numbness on the right side of the face.  Some right lower extremity weakness.  Patient with speech difficulty but may be secondary to the significant facial droop.     ED Results / Procedures /  Treatments   Labs (all labs ordered are listed, but only abnormal results are displayed) Labs Reviewed  CBC  DIFFERENTIAL  ETHANOL  PROTIME-INR  APTT  COMPREHENSIVE METABOLIC PANEL  RAPID URINE DRUG SCREEN, HOSP PERFORMED  URINALYSIS, ROUTINE W REFLEX MICROSCOPIC  PREGNANCY, URINE  CBG  MONITORING, ED    EKG EKG Interpretation  Date/Time:  Monday June 15 2019 18:44:07 EST Ventricular Rate:  87 PR Interval:    QRS Duration: 80 QT Interval:  372 QTC Calculation: 448 R Axis:   36 Text Interpretation: Sinus rhythm Confirmed by Vanetta Mulders [phone]) on 06/15/2019 6:49:39 PM   Radiology CT HEAD CODE STROKE WO CONTRAST  Result Date: 06/15/2019 CLINICAL DATA:  Code stroke.  Patent unresponsive EXAM: CT HEAD WITHOUT CONTRAST TECHNIQUE: Contiguous axial images were obtained from the base of the skull through the vertex without intravenous contrast. COMPARISON:  August 27, 2018 FINDINGS: Brain: No evidence of acute infarction, hemorrhage, hydrocephalus, extra-axial collection or mass lesion/mass effect. Vascular: No hyperdense vessel or unexpected calcification. Skull: Unremarkable. Sinuses/Orbits: No acute finding. Other: None. ASPECTS ([place]ta Stroke Program Early CT Score) - Ganglionic level infarction (caudate, lentiform nuclei, internal capsule, insula, M1-M3 cortex): 7 - Supraganglionic infarction (M4-M6 cortex): 3 Total score (0-10 with 10 being normal): 10 IMPRESSION: Intracranial hemorrhage, mass effect, or evidence of acute infarction. ASPECTS is 10. Electronically Signed   By: Guadlupe Spanish M.D.   On: 06/15/2019 19:07    Procedures Procedures (including critical care time)   CRITICAL CARE Performed by: Vanetta Mulders Total critical care time: 30 minutes Critical care time was exclusive of separately billable procedures and treating other patients. Critical care was necessary to treat or prevent imminent or life-threatening deterioration. Critical care was time spent personally by me on the following activities: development of treatment plan with patient and/or surrogate as well as nursing, discussions with consultants, evaluation of patient's response to treatment, examination of patient, obtaining history from patient or surrogate, ordering and performing  treatments and interventions, ordering and review of laboratory studies, ordering and review of radiographic studies, pulse oximetry and re-evaluation of patient's condition.  Medications Ordered in ED Medications  alteplase (ACTIVASE) 1 mg/mL infusion 82.4 mg (has no administration in time range)    Followed by  0.9 %  sodium chloride infusion (has no administration in time range)    ED Course  I have reviewed the triage vital signs and the nursing notes.  Pertinent labs & imaging results that were available during my care of the patient were reviewed by me and considered in my medical decision making (see chart for details).    MDM Rules/Calculators/A&P                      Patient with potential code stroke upon presentation went immediately for head CT CTA head was negative.  Teleneurology consult was done.  Based on her symptoms and young age they recommended TPA TPA was initiated.  Discussed with Dr. Otelia Limes at [facility].  He recommended CT angio head and neck which we got no acute findings on that.  Patient showing improvement with the TPA.  Patient will be transferred by [facility] to 4 N. ICU neuro bed.  Accepting is Dr. Otelia Limes.   Patient without any Covid symptoms.  Patient will have asymptomatic Covid testing performed.    Final Clinical Impression(s) / ED Diagnoses Final diagnoses:  Cerebrovascular accident (CVA), unspecified mechanism (HCC)    Rx / DC Orders ED Discharge Orders  None       Fredia Sorrow, MD 06/15/19 2012

## 2019-06-15 NOTE — ED Notes (Signed)
Neuro on teleneuro machine with pt

## 2019-06-15 NOTE — Progress Notes (Signed)
EEG complete - results pending 

## 2019-06-15 NOTE — ED Triage Notes (Addendum)
Per estranged husband and per pt's son-pt was ironing at 60pm-was found by son unresponsive on the floor at 46pm-pt was able to tell them that she had a HA and that was ~6pm-pt able to speak name only with much effort and is tearful-taken to tx area via w/c

## 2019-06-15 NOTE — H&P (Addendum)
Admission H&P    Chief Complaint: Acute onset of right facial weakness, mild right sided weakness, sensory deficit and dysarthria after apparent collapse at home.   HPI: Carmen [name] is an 38 y.o. female who was in her USOH today, ironing at home at 5:30 PM, then was found by her son on the floor at 5:45 after she had apparently collapsed. She was noted to have difficulty speaking with right facial droop and was complaining of a headache. In the ED, her CBG was 85. She has a history of migraines, but none with accompanying neurological deficit. Also with a remote history of seizures listed in Epic, but patient states that these spells were later revealed to be secondary to cardiogenic syncope. Code Stroke was called and Teleneurology assessment was performed. Decision was made to administer tPA. NIHSS per Teleneurology note was 6 (right facial weakness, RLE and RUE drift, sensory deficit and dysarthria).  LSN: 5:30 PM tPA Given: Yes   Past Medical History:  Diagnosis Date  . Anxiety   . Costochondritis 09/13/2010  . Depression   . GERD (gastroesophageal reflux disease)   . Headache    migrains  . Heart palpitations    long history of   . History of chest pain   . History of cholecystitis    Laparoscopic cholecystectomy  . History of fatigue   . History of kidney stones   . History of migraine headaches   . History of seizure disorder    remote history of seizure activity and was [facility] this in the past  She is presently not on any seizure  medications and has not had any seizures for many years.  . History of tilt table evaluation    IMPRESSION: Positive tilt table study with significant cardioinhibitory  response.  Marland Kitchen. Hyperlipidemia   . Hypertension   . Hypothyroidism   . Kidney stones    history of  . Neurocardiogenic syncope   . Pericarditis 09/13/2010  . Presence of permanent cardiac pacemaker   . PSVT (paroxysmal supraventricular tachycardia) (HCC)     . Sleeping difficulties   . Stress   . SVD (spontaneous vaginal delivery)    x 1  . Syncope and collapse    neurocardiogenic    Past Surgical History:  Procedure Laterality Date  . ANTERIOR AND POSTERIOR REPAIR N/A 11/14/2017   Procedure: ANTERIOR (CYSTOCELE) AND POSTERIOR REPAIR (RECTOCELE);  Surgeon: Silverio Layivard, Sandra, MD;  Location: [facility];  Service: Gynecology;  Laterality: N/A;  . BILATERAL SALPINGECTOMY Bilateral 11/14/2017   Procedure: BILATERAL SALPINGECTOMY;  Surgeon: Silverio Layivard, Sandra, MD;  Location: [facility];  Service: Gynecology;  Laterality: Bilateral;  . BLADDER SUSPENSION Bilateral 11/14/2017   Procedure: TRANSVAGINAL TAPE (TVT) PROCEDURE;  Surgeon: Osborn Cohooberts, Angela, MD;  Location: [facility];  Service: Gynecology;  Laterality: Bilateral;  . CHOLECYSTECTOMY  11/2007   Cholecystitis - Laparoscopic cholecystectomy with intraoperative  cholangiogram.  . CHOLECYSTECTOMY    . CYSTOSCOPY N/A 11/14/2017   Procedure: CYSTOSCOPY;  Surgeon: Osborn Cohooberts, Angela, MD;  Location: [facility];  Service: Gynecology;  Laterality: N/A;  . PACEMAKER INSERTION  2016  . Tilt Table Study  07/17/2005   Positive tilt table study with significant cardioinhibitory  response.  Marland Kitchen. VAGINAL HYSTERECTOMY N/A 11/14/2017   Procedure: HYSTERECTOMY VAGINAL;  Surgeon: Silverio Layivard, Sandra, MD;  Location: [facility];  Service: Gynecology;  Laterality: N/A;  . WISDOM TOOTH EXTRACTION      Family History  Problem Relation Age of Onset  . Hypertension Mother   .  Heart disease Mother   . Cancer Mother        breast  . Heart attack Father   . Hyperlipidemia Father   . Hepatitis C Father   . Cancer Maternal Aunt        Breast   Social History:  reports that she has never smoked. She has never used smokeless tobacco. She reports current alcohol use. She reports that she does not use drugs.  Allergies:  Allergies  Allergen Reactions  . Tape Hives    Adhesive tape - tegaderm/paper tape ok  . Dilaudid [Hydromorphone Hcl] Itching    "I  just about crawled out of my skin"  . Quinolones Nausea Only    Home Medications:   ROS: Complains of 10/10 headache and pain along lateral aspect of left ankle. Other symptoms as per HPI. Comprehensive ROS otherwise negative.   Physical Examination: Blood pressure 123/78, pulse 81, resp. rate 12, weight 91.6 kg, SpO2 100 %, unknown if currently breastfeeding.  HEENT-  [place]/AT Lungs - Respirations unlabored Extremities - No edema  Neurologic Examination: Mental Status: Awake and alert. Fully oriented, thought content appropriate.  Speech fluent with intact comprehension and naming.   Cranial Nerves: II:  Visual fields intact with no extinction to DSS. Pupils 5 mm and equally reactive bilaterally.  III,IV, VI: No ptosis. EOMI without nystagmus.  V,VII: Smile symmetric. Facial temp sensation increased on the right (hyperesthesia). VIII: hearing intact to conversation IX,X: Palate rises symmetrically XI: Symmetric shoulder shrug XII: midline tongue extension  Motor: RUE 5/5 RLE max strength elicited in hip flexors/extensors, knee flexors/extensors and ADF/APF is 4+/5 with give-way weakness manifesting several times LUE and LLE 5/5 Sensory: Hyperesthesia to temp RUE and RLE. FT intact x 4. Positive for extinction on the right.  Deep Tendon Reflexes:  2+ bilateral biceps and brachioradialis 3+ patellae bilaterally 2+ achilles bilaterally  Plantars: Right: downgoing   Left: downgoing Cerebellar: No ataxia with FNF bilaterally  Gait: Deferred  Results for orders placed or performed during the hospital encounter of 06/15/19 (from the past 48 hour(s))  Ethanol     Status: None   Collection Time: 06/15/19  6:47 PM  Result Value Ref Range   Alcohol, Ethyl (B) <10 <10 mg/dL    Comment:        LOWEST DETECTABLE LIMIT FOR SERUM ALCOHOL IS 10 mg/dL FOR MEDICAL PURPOSES ONLY Performed at [facility], [address] [place], [place] [address]   Protime-INR     Status:  None   Collection Time: 06/15/19  6:48 PM  Result Value Ref Range   Prothrombin Time 12.9 11.4 - 15.2 seconds   INR 1.0 0.8 - 1.2    Comment: (NOTE) INR goal varies based on device and disease states. Performed at [facility], [address] [place], [place] [address]   APTT     Status: None   Collection Time: 06/15/19  6:48 PM  Result Value Ref Range   aPTT 27 24 - 36 seconds    Comment: Performed at [facility], [address] [place], [place] [address]  CBC     Status: None   Collection Time: 06/15/19  6:48 PM  Result Value Ref Range   WBC 6.1 4.0 - 10.5 K/uL   RBC 4.58 3.87 - 5.11 MIL/uL   Hemoglobin 14.3 12.0 - 15.0 g/dL   HCT 91.4 78.2 - 95.6 %   MCV 92.4 80.0 - 100.0 fL  MCH 31.2 26.0 - 34.0 pg   MCHC 33.8 30.0 - 36.0 g/dL   RDW 09.8 11.9 - 14.7 %   Platelets 263 150 - 400 K/uL   nRBC 0.0 0.0 - 0.2 %    Comment: Performed at [facility], [address] [place], [place] [address]  Differential     Status: None   Collection Time: 06/15/19  6:48 PM  Result Value Ref Range   Neutrophils Relative % 64 %   Neutro Abs 3.9 1.7 - 7.7 K/uL   Lymphocytes Relative 27 %   Lymphs Abs 1.7 0.7 - 4.0 K/uL   Monocytes Relative 5 %   Monocytes Absolute 0.3 0.1 - 1.0 K/uL   Eosinophils Relative 3 %   Eosinophils Absolute 0.2 0.0 - 0.5 K/uL   Basophils Relative 1 %   Basophils Absolute 0.0 0.0 - 0.1 K/uL   Immature Granulocytes 0 %   Abs Immature Granulocytes 0.01 0.00 - 0.07 K/uL    Comment: Performed at [facility], [address] [place], [place] [address]  Comprehensive metabolic panel     Status: None   Collection Time: 06/15/19  6:48 PM  Result Value Ref Range   Sodium 139 135 - 145 mmol/L   Potassium 3.8 3.5 - 5.1 mmol/L   Chloride 110 98 - 111 mmol/L   CO2 22 22 - 32 mmol/L   Glucose, Bld 94 70 - 99 mg/dL   BUN 12 6 - 20 mg/dL   Creatinine, Ser 6.57 0.44 - 1.00 mg/dL   Calcium 9.1 8.9 - 84.6 mg/dL   Total Protein  7.6 6.5 - 8.1 g/dL   Albumin 4.4 3.5 - 5.0 g/dL   AST 20 15 - 41 U/L   ALT 22 0 - 44 U/L   Alkaline Phosphatase 66 38 - 126 U/L   Total Bilirubin 0.4 0.3 - 1.2 mg/dL   GFR calc non Af Amer >60 >60 mL/min   GFR calc Af Amer >60 >60 mL/min   Anion gap 7 5 - 15    Comment: Performed at [facility], [address] [place], [place] [address]  CBG monitoring, ED     Status: None   Collection Time: 06/15/19  6:54 PM  Result Value Ref Range   Glucose-Capillary 82 70 - 99 mg/dL   CT Angio Head W or Wo Contrast  Result Date: 06/15/2019 CLINICAL DATA:  Found unresponsive EXAM: CT ANGIOGRAPHY HEAD AND NECK TECHNIQUE: Multidetector CT imaging of the head and neck was performed using the standard protocol during bolus administration of intravenous contrast. Multiplanar CT image reconstructions and MIPs were obtained to evaluate the vascular anatomy. Carotid stenosis measurements (when applicable) are obtained utilizing NASCET criteria, using the distal internal carotid diameter as the denominator. CONTRAST:  OMNIPAQUE IOHEXOL 350 MG/ML SOLN COMPARISON:  None. FINDINGS: CTA NECK FINDINGS Aortic arch: Great vessel origins are patent. Right carotid system: Patent. No measurable stenosis or evidence of dissection. Left carotid system: Patent. No measurable stenosis or evidence of dissection. Vertebral arteries: Patent. The left vertebral artery is dominant with a likely congenitally diminutive right vertebral artery. Skeleton: Unremarkable. Other neck: No neck mass or adenopathy Upper chest: No apical lung mass. Review of the MIP images confirms the above findings CTA HEAD FINDINGS Anterior circulation: Intracranial internal carotid arteries, middle cerebral arteries, and anterior cerebral arteries are patent. Posterior circulation: Intracranial vertebral arteries, basilar artery, and posterior cerebral arteries are patent. Bilateral posterior communicating  arteries are present. Venous sinuses: As  permitted by contrast timing, patent. Anatomic variants: None Review of the MIP images confirms the above findings IMPRESSION: No large vessel occlusion, hemodynamically significant stenosis, or evidence of dissection. Electronically Signed   By: Macy Mis M.D.   On: 06/15/2019 20:04   CT Angio Neck W and/or Wo Contrast  Result Date: 06/15/2019 CLINICAL DATA:  Found unresponsive EXAM: CT ANGIOGRAPHY HEAD AND NECK TECHNIQUE: Multidetector CT imaging of the head and neck was performed using the standard protocol during bolus administration of intravenous contrast. Multiplanar CT image reconstructions and MIPs were obtained to evaluate the vascular anatomy. Carotid stenosis measurements (when applicable) are obtained utilizing NASCET criteria, using the distal internal carotid diameter as the denominator. CONTRAST:  152mL OMNIPAQUE IOHEXOL 350 MG/ML SOLN COMPARISON:  None. FINDINGS: CTA NECK FINDINGS Aortic arch: Great vessel origins are patent. Right carotid system: Patent. No measurable stenosis or evidence of dissection. Left carotid system: Patent. No measurable stenosis or evidence of dissection. Vertebral arteries: Patent. The left vertebral artery is dominant with a likely congenitally diminutive right vertebral artery. Skeleton: Unremarkable. Other neck: No neck mass or adenopathy Upper chest: No apical lung mass. Review of the MIP images confirms the above findings CTA HEAD FINDINGS Anterior circulation: Intracranial internal carotid arteries, middle cerebral arteries, and anterior cerebral arteries are patent. Posterior circulation: Intracranial vertebral arteries, basilar artery, and posterior cerebral arteries are patent. Bilateral posterior communicating arteries are present. Venous sinuses: As permitted by contrast timing, patent. Anatomic variants: None Review of the MIP images confirms the above findings IMPRESSION: No large vessel occlusion, hemodynamically significant stenosis, or evidence  of dissection. Electronically Signed   By: Macy Mis M.D.   On: 06/15/2019 20:04   CT HEAD CODE STROKE WO CONTRAST  Result Date: 06/15/2019 CLINICAL DATA:  Code stroke.  Patent unresponsive EXAM: CT HEAD WITHOUT CONTRAST TECHNIQUE: Contiguous axial images were obtained from the base of the skull through the vertex without intravenous contrast. COMPARISON:  August 27, 2018 FINDINGS: Brain: No evidence of acute infarction, hemorrhage, hydrocephalus, extra-axial collection or mass lesion/mass effect. Vascular: No hyperdense vessel or unexpected calcification. Skull: Unremarkable. Sinuses/Orbits: No acute finding. Other: None. ASPECTS ([place] Stroke Program Early CT Score) - Ganglionic level infarction (caudate, lentiform nuclei, internal capsule, insula, M1-M3 cortex): 7 - Supraganglionic infarction (M4-M6 cortex): 3 Total score (0-10 with 10 being normal): 10 IMPRESSION: Intracranial hemorrhage, mass effect, or evidence of acute infarction. ASPECTS is 10. Electronically Signed   By: Macy Mis M.D.   On: 06/15/2019 19:07    Assessment: 38 y.o. female presenting with lateralized weakness after being found on the floor initially unresponsive, then dysphasic, complaining of a headache.  1. Status post tPA at OSH. Teleneurology has assessed the patient.  2. CT head normal.  3. CTA head/neck: No LVO 4. DDx for the patient's presentation includes unwitnessed seizure with Todd's paresis, complicated migraine (has a history of migraines, but without neurological deficits in the past) versus stroke.  5. Stroke Risk Factors - HLD and HTN 6. Remote history of seizures listed in Epic, but patient states that these spells were later revealed to be secondary to cardiogenic syncope. 7. Has MRI-compatible pacemaker  Plan: 1. Admitting to the Neuro ICU after the patient has been transferred from [facility].  2. Post-tPA order set to include frequent neuro checks and BP management.  3. No  antiplatelet medications or anticoagulants for at least 24 hours following tPA.  4. DVT prophylaxis with SCDs.  5. May need to be started on a statin.  6. Will need escalation of antiplatelet therapy if follow up CT at 24 hours is negative for hemorrhagic conversion and her overall presentation is felt by the AM Stroke team to be more consistent with stroke than complicated migraine or seizure with Todd's paresis. 7. TTE.  8. Fasting lipid panel, HgbA1c 9. MRI brain with assistance of [employer] rep in the AM.  Model number of pacemaker is [id number] (two additional model numbers on card which may be leads: 5076-45 and 5076-52) 10. PT/OT/Speech.  11. NPO until passes swallow evaluation.  12. Telemetry monitoring 13. EEG (ordered) 14. Benadryl 25 mg po x 1 for mild neck rash (ordered). 15. Phenergan 25 mg IV x 1 for severe headache (ordered).  16. Discontinuing Vyvanse and Ultram 17. Stroke team to call Cardiology in the AM if patient continues to have anxiety regarding bradycardia 18. Plain films of left ankle (ordered)   Electronically signed: Dr. Caryl Pina 06/15/2019, 8:18 PM

## 2019-06-15 NOTE — ED Notes (Signed)
Pt's slurred speech has improved prior to TPA given, right side facial droop cont. Along with right  Grip slightly weak and right leg slightly weak.

## 2019-06-15 NOTE — Consult Note (Signed)
[facility] TeleNeurology Consult Services   Date of Service:   06/15/2019 18:48:04  Impression:     .  I63.9 - Cerebrovascular accident (CVA), unspecified mechanism (HCC)  Comments/Sign-Out: Patient presented with right sided weakness and dysarthria. HCT showed no acute process. Alteplase was offered to the patient. Etiology is ischemic vs complicated migraine. Will be transferred to ICU for further evaluation.  Metrics: Last Known Well: 06/15/2019 17:30:00 [facility] Notification Time: 06/15/2019 18:46:51 Arrival Time: 06/15/2019 18:28:00 Stamp Time: 06/15/2019 18:48:04 Time First Login Attempt: 06/15/2019 18:53:07 Video Start Time: 06/15/2019 18:53:07  Symptoms: AMS NIHSS Start Assessment Time: 06/15/2019 19:00:00 Alteplase Early Mix Decision Time: 06/15/2019 19:07:32 Patient is a candidate for Alteplase/Activase. Alteplase/Activase CPOE Order Time: 06/15/2019 19:10:47 Needle Time: 06/15/2019 19:23:54 Weight Noted by Staff: 202 lbs Video End Time: 06/15/2019 19:24:33 Reason for Alteplase/Activase Delay: Activation Delay  CT head showed no acute hemorrhage or acute core infarct.  Clinical Presentation is not Suggestive of Large Vessel Occlusive Disease  ED Physician notified of diagnostic impression and management plan on 06/15/2019 19:13:32  Alteplase/Activase Contraindications:  Last Known Well > 4.5 hours: No CT Head showing hemorrhage: No Ischemic stroke within 3 months: No Severe head trauma within 3 months: No Intracranial/intraspinal surgery within 3 months: No History of intracranial hemorrhage: No Symptoms and signs consistent with an SAH: No GI malignancy or GI bleed within 21 days: No Coagulopathy: Platelets <100 000/mm3, INR >1.7, aPTT>40 s, or PT >15 s: No Treatment dose of LMWH within the previous 24 hrs: No Use of NOACs in past 48 hours: No Glycoprotein IIb/IIIa receptor inhibitors use: No Symptoms consistent with infective endocarditis:  No Suspected aortic arch dissection: No Intra-axial intracranial neoplasm: No  Verbal Consent to Alteplase/Activase: I have explained to the Patient the nature of the patient's condition, reviewed the indications and contraindications to the use of Alteplase/Activase fibrinolytic agent, reviewed the indications and contraindications and the benefits to be reasonably expected compared with alternative approaches. I have discussed the likelihood of major risks or complications of this procedure including (if applicable) but not limited to loss of limb function, brain damage, paralysis, hemorrhage, infection, complications from transfusion of blood components, drug reactions, blood clots and loss of life. I have also indicated that with any procedure there is always the possibility of an unexpected complication. All questions were answered and Patient express understanding of the treatment plan and consent to the treatment.  Our recommendations are outlined below.  Recommendations: IV Alteplase/Activase recommended.  Alteplase/Activase bolus given Without Complication.   IV Alteplase/Activase Total Dose - 82.4 mg IV Alteplase/Activase Bolus Dose - 8.2 mg IV Alteplase/Activase Infusion Dose - 74.2 mg  Routine post Alteplase/Activase monitoring including neuro checks and blood pressure control during/after treatment Monitor blood pressure Check blood pressure and NIHSS every 15 min for 2 h, then every 30 min for 6 h, and finally every hour for 16 h.  Manage Blood Pressure per post Alteplase/Activase protocol.      .  Admission to ICU     .  CT brain 24 hours post Alteplase/Activase     .  NPO until swallowing screen performed and passed     .  No antiplatelet agents or anticoagulants (including heparin for DVT prophylaxis) in first 24 hours     .  No Foley catheter, nasogastric tube, arterial catheter or central venous catheter for 24 hr, unless absolutely necessary     .  Telemetry     .   Bedside swallow evaluation     .  HOB less than 30 degrees     .  Euglycemia     .  Avoid hyperthermia, PRN acetaminophen     .  DVT prophylaxis     .  Inpatient Neurology Consultation     .  Stroke evaluation as per inpatient neurology recommendations  Discussed with ED physician    ------------------------------------------------------------------------------  History of Present Illness: Patient is a 38 year old Female.  Patient was brought by private transportation with symptoms of AMS  Past Medical history Anxiety, Seizures, Hypothyroidism, Hypertension presented with altered mental status. pt was ironing at 530pm-was found by son unresponsive on the floor at 545pm-pt was able to tell the that she had a HA. Currently back to her normal self.  Last seen normal was within 4.5 hours. There is no history of hemorrhagic complications or intracranial hemorrhage. There is no history of Recent Anticoagulants. There is no history of recent major surgery. There is no history of recent stroke.  Anticoagulant use:  No  Antiplatelet use: No    Examination: BP(119/94), Pulse(87), Blood Glucose(85) 1A: Level of Consciousness - Alert; keenly responsive + 0 1B: Ask Month and Age - Both Questions Right + 0 1C: Blink Eyes & Squeeze Hands - Performs Both Tasks + 0 2: Test Horizontal Extraocular Movements - Normal + 0 3: Test Visual Fields - No Visual Loss + 0 4: Test Facial Palsy (Use Grimace if Obtunded) - Partial paralysis (lower face) + 2 5A: Test Left Arm Motor Drift - No Drift for 10 Seconds + 0 5B: Test Right Arm Motor Drift - Drift, but doesn't hit bed + 1 6A: Test Left Leg Motor Drift - No Drift for 5 Seconds + 0 6B: Test Right Leg Motor Drift - Drift, but doesn't hit bed + 1 7: Test Limb Ataxia (FNF/Heel-Shin) - No Ataxia + 0 8: Test Sensation - Mild-Moderate Loss: Less Sharp/More Dull + 1 9: Test Language/Aphasia - Normal; No aphasia + 0 10: Test Dysarthria - Mild-Moderate  Dysarthria: Slurring but can be understood + 1 11: Test Extinction/Inattention - No abnormality + 0  NIHSS Score: 6  Pre-Morbid Modified Ranking Scale: 0 Points = No symptoms at all  Patient/Family was informed the Neurology Consult would happen via TeleHealth consult by way of interactive audio and video telecommunications and consented to receiving care in this manner.   Due to the immediate potential for life-threatening deterioration due to underlying acute neurologic illness, I spent 34 minutes providing critical care. This time includes time for face to face visit via telemedicine, review of medical records, imaging studies and discussion of findings with providers, the patient and/or family.   Dr Hinda Lenis Zarif Rathje   [facility] [phone]  Case [id number]

## 2019-06-15 NOTE — ED Notes (Signed)
report given to [facility] ICU , report given to [facility]

## 2019-06-16 ENCOUNTER — Inpatient Hospital Stay (HOSPITAL_COMMUNITY): Payer: Medicaid Other

## 2019-06-16 DIAGNOSIS — I639 Cerebral infarction, unspecified: Secondary | ICD-10-CM

## 2019-06-16 DIAGNOSIS — I6300 Cerebral infarction due to thrombosis of unspecified precerebral artery: Secondary | ICD-10-CM

## 2019-06-16 DIAGNOSIS — I6389 Other cerebral infarction: Secondary | ICD-10-CM

## 2019-06-16 LAB — LIPID PANEL
Cholesterol: 231 mg/dL — ABNORMAL HIGH (ref 0–200)
HDL: 30 mg/dL — ABNORMAL LOW (ref >40)
LDL Cholesterol: UNDETERMINED mg/dL (ref 0–99)
Total CHOL/HDL Ratio: 7.7 RATIO
Triglycerides: 490 mg/dL — ABNORMAL HIGH (ref <150)
VLDL: UNDETERMINED mg/dL (ref 0–40)

## 2019-06-16 LAB — HEMOGLOBIN A1C
Hgb A1c MFr Bld: 5.1 % (ref 4.8–5.6)
Mean Plasma Glucose: 99.67 mg/dL

## 2019-06-16 LAB — SARS CORONAVIRUS 2 (TAT 6-24 HRS): SARS Coronavirus 2: NEGATIVE

## 2019-06-16 LAB — ECHOCARDIOGRAM COMPLETE
Height: 62 in
Weight: 3231.06 oz

## 2019-06-16 LAB — LDL CHOLESTEROL, DIRECT: Direct LDL: 142.6 mg/dL — ABNORMAL HIGH (ref 0–99)

## 2019-06-16 LAB — HIV ANTIBODY (ROUTINE TESTING W REFLEX): HIV Screen 4th Generation wRfx: NONREACTIVE

## 2019-06-16 MED ORDER — CHLORHEXIDINE GLUCONATE CLOTH 2 % EX PADS
6.0000 | MEDICATED_PAD | Freq: Every day | CUTANEOUS | Status: DC
  Administered 2019-06-16 – 2019-06-17 (×2): 6 via TOPICAL

## 2019-06-16 MED ORDER — DIPHENHYDRAMINE HCL 25 MG PO CAPS
50.0000 mg | ORAL_CAPSULE | Freq: Four times a day (QID) | ORAL | Status: DC | PRN
  Administered 2019-06-16 – 2019-06-17 (×4): 50 mg via ORAL
  Filled 2019-06-16 (×5): qty 2

## 2019-06-16 MED ORDER — ATORVASTATIN CALCIUM 40 MG PO TABS
40.0000 mg | ORAL_TABLET | Freq: Every day | ORAL | Status: DC
  Administered 2019-06-16: 40 mg via ORAL
  Filled 2019-06-16: qty 1

## 2019-06-16 MED ORDER — INFLUENZA VAC SPLIT QUAD 0.5 ML IM SUSY
0.5000 mL | PREFILLED_SYRINGE | INTRAMUSCULAR | Status: AC
  Administered 2019-06-17: 0.5 mL via INTRAMUSCULAR
  Filled 2019-06-16: qty 0.5

## 2019-06-16 NOTE — Procedures (Signed)
Patient Name: Carmen [name]  MRN: [id number]  Epilepsy Attending: Lora Havens  Referring Physician/Provider: Dr. Kerney Elbe Date: 06/15/2019  Duration: 21.49 minutes  Patient history: 38 year old female who presented with lateralized weakness and unresponsiveness.  EEG to evaluate for seizures.  Level of alertness: Awake  AEDs during EEG study: None  Technical aspects: This EEG study was done with scalp electrodes positioned according to the 10-20 International system of electrode placement. Electrical activity was acquired at a sampling rate of 500Hz  and reviewed with a high frequency filter of 70Hz  and a low frequency filter of 1Hz . EEG data were recorded continuously and digitally stored.   Description: The posterior dominant rhythm consists of 10 Hz activity of moderate voltage (25-35 uV) seen predominantly in posterior head regions, symmetric and reactive to eye opening and eye closing. Hyperventilation and photic stimulation were not performed.  IMPRESSION: This study is within normal limits. No seizures or epileptiform discharges were seen throughout the recording.  Lonzie Simmer Barbra Sarks

## 2019-06-16 NOTE — Progress Notes (Signed)
  Echocardiogram 2D Echocardiogram has been performed.  Randa Lynn Stephan Nelis 06/16/2019, 2:37 PM

## 2019-06-16 NOTE — Progress Notes (Signed)
PT Cancellation Note  Patient Details Name: Carmen [name] MRN: [id number] DOB: [birth year]   Cancelled Treatment:    Reason Eval/Treat Not Completed: Patient not medically ready;Patient at procedure or test/unavailable.  Pt finally off of bedrest and then went to MRI.  Will see 12/23 as able. 06/16/2019  Ginger Carne., PT Acute Rehabilitation Services [phone]  (pager) [phone]  (office)   Tessie Fass Kellon Chalk 06/16/2019, 4:43 PM

## 2019-06-16 NOTE — Progress Notes (Signed)
OT Cancellation Note  Patient Details Name: Carmen [name] MRN: [id number] DOB: Aug 23, 1980   Cancelled Treatment:    Reason Eval/Treat Not Completed: Active bedrest order OT order received and appreciated however this conflicts with current bedrest order set. Please increase activity tolerance as appropriate and remove bedrest from orders. . Please contact OT at [phone] if bed rest order is discontinued. OT will hold evaluation at this time and will check back as time allows pending increased activity orders.    Fleeta Emmer, OTR/L  Acute Rehabilitation Services Pager: [phone] Office: [phone] .   Jeri Modena 06/16/2019, 7:51 AM

## 2019-06-16 NOTE — Progress Notes (Addendum)
STROKE TEAM PROGRESS NOTE   HISTORY OF PRESENT ILLNESS (per record)  Carmen [name] is an 38 y.o. female who presented to outside hospital as CODE Stroke when she was found by her son on the floor after she had apparently collapsed. She was noted to have difficulty speaking with right facial droop and was complaining of a headache. She has a history of migraines, but none with accompanying neurological deficit. Teleneurology assessment was performed. Decision was made to administer tPA. NIHSS per Teleneurology note was 6 (right facial weakness, RLE and RUE drift, sensory deficit and dysarthria).   INTERVAL HISTORY Her RN is at the bedside. Appears that all presenting symptoms have resolved. NIH0. Awaiting MRI. EEG was neg. I have personally reviewed history of presenting illness with the patient, electronic medical records and imaging films in PACS.  She has remained stable overnight and made significant neurological recovery with full resolution of her deficits.  Blood pressure adequately controlled.  She has no complaints this morning  OBJECTIVE Vitals:   06/16/19 1000 06/16/19 1100 06/16/19 1200 06/16/19 1300  BP: (!) 136/92 118/86 130/87 130/81  Pulse: 85 79 78 88  Resp: (!) 23 18 (!) 26 20  Temp:   97.8 F (36.6 C)   TempSrc:   Oral   SpO2: 100% 98% 99% 100%  Weight:      Height:        CBC:  Recent Labs  Lab 06/15/19 1848  WBC 6.1  NEUTROABS 3.9  HGB 14.3  HCT 42.3  MCV 92.4  PLT 263    Basic Metabolic Panel:  Recent Labs  Lab 06/15/19 1848  NA 139  K 3.8  CL 110  CO2 22  GLUCOSE 94  BUN 12  CREATININE 0.78  CALCIUM 9.1    Lipid Panel:     Component Value Date/Time   CHOL 231 (H) 06/16/2019 0616   TRIG 490 (H) 06/16/2019 0616   HDL 30 (L) 06/16/2019 0616   CHOLHDL 7.7 06/16/2019 0616   VLDL UNABLE TO CALCULATE IF TRIGLYCERIDE OVER 400 mg/dL 46/96/295212/22/2020 84130616   LDLCALC UNABLE TO CALCULATE IF TRIGLYCERIDE OVER 400 mg/dL 24/40/102712/22/2020 25360616   [id number]:  Lab  Results  Component Value Date   HGBA1C 5.1 06/16/2019   Urine Drug Screen: No results found for: LABOPIA, COCAINSCRNUR, LABBENZ, AMPHETMU, THCU, LABBARB  Alcohol Level     Component Value Date/Time   [facility] 06/15/2019 1847    IMAGING   EEG  Result Date: 06/16/2019 Charlsie QuestYadav, Priyanka O, MD     06/16/2019  8:47 AM Patient Name: Carmen [name] MRN: [id number] Epilepsy Attending: Charlsie QuestPriyanka O Yadav Referring Physician/Provider: Dr. Caryl PinaEric Lindzen Date: 06/15/2019 Duration: 21.49 minutes Patient history: 38 year old female who presented with lateralized weakness and unresponsiveness.  EEG to evaluate for seizures. Level of alertness: Awake AEDs during EEG study: None Technical aspects: This EEG study was done with scalp electrodes positioned according to the 10-20 International system of electrode placement. Electrical activity was acquired at a sampling rate of 500Hz  and reviewed with a high frequency filter of 70Hz  and a low frequency filter of 1Hz . EEG data were recorded continuously and digitally stored. Description: The posterior dominant rhythm consists of 10 Hz activity of moderate voltage (25-35 uV) seen predominantly in posterior head regions, symmetric and reactive to eye opening and eye closing. Hyperventilation and photic stimulation were not performed. IMPRESSION: This study is within normal limits. No seizures or epileptiform discharges were seen throughout the recording. Priyanka Annabelle Harman Yadav  CT Angio Head W or Wo Contrast  Result Date: 06/15/2019 CLINICAL DATA:  Found unresponsive EXAM: CT ANGIOGRAPHY HEAD AND NECK TECHNIQUE: Multidetector CT imaging of the head and neck was performed using the standard protocol during bolus administration of intravenous contrast. Multiplanar CT image reconstructions and MIPs were obtained to evaluate the vascular anatomy. Carotid stenosis measurements (when applicable) are obtained utilizing NASCET criteria, using the distal internal carotid diameter as the  denominator. CONTRAST:  OMNIPAQUE IOHEXOL 350 MG/ML SOLN COMPARISON:  None. FINDINGS: CTA NECK FINDINGS Aortic arch: Great vessel origins are patent. Right carotid system: Patent. No measurable stenosis or evidence of dissection. Left carotid system: Patent. No measurable stenosis or evidence of dissection. Vertebral arteries: Patent. The left vertebral artery is dominant with a likely congenitally diminutive right vertebral artery. Skeleton: Unremarkable. Other neck: No neck mass or adenopathy Upper chest: No apical lung mass. Review of the MIP images confirms the above findings CTA HEAD FINDINGS Anterior circulation: Intracranial internal carotid arteries, middle cerebral arteries, and anterior cerebral arteries are patent. Posterior circulation: Intracranial vertebral arteries, basilar artery, and posterior cerebral arteries are patent. Bilateral posterior communicating arteries are present. Venous sinuses: As permitted by contrast timing, patent. Anatomic variants: None Review of the MIP images confirms the above findings IMPRESSION: No large vessel occlusion, hemodynamically significant stenosis, or evidence of dissection. Electronically Signed   By: Guadlupe Spanish M.D.   On: 06/15/2019 20:04   CT Angio Neck W and/or Wo Contrast  Result Date: 06/15/2019 CLINICAL DATA:  Found unresponsive EXAM: CT ANGIOGRAPHY HEAD AND NECK TECHNIQUE: Multidetector CT imaging of the head and neck was performed using the standard protocol during bolus administration of intravenous contrast. Multiplanar CT image reconstructions and MIPs were obtained to evaluate the vascular anatomy. Carotid stenosis measurements (when applicable) are obtained utilizing NASCET criteria, using the distal internal carotid diameter as the denominator. CONTRAST:  OMNIPAQUE IOHEXOL 350 MG/ML SOLN COMPARISON:  None. FINDINGS: CTA NECK FINDINGS Aortic arch: Great vessel origins are patent. Right carotid system: Patent. No measurable  stenosis or evidence of dissection. Left carotid system: Patent. No measurable stenosis or evidence of dissection. Vertebral arteries: Patent. The left vertebral artery is dominant with a likely congenitally diminutive right vertebral artery. Skeleton: Unremarkable. Other neck: No neck mass or adenopathy Upper chest: No apical lung mass. Review of the MIP images confirms the above findings CTA HEAD FINDINGS Anterior circulation: Intracranial internal carotid arteries, middle cerebral arteries, and anterior cerebral arteries are patent. Posterior circulation: Intracranial vertebral arteries, basilar artery, and posterior cerebral arteries are patent. Bilateral posterior communicating arteries are present. Venous sinuses: As permitted by contrast timing, patent. Anatomic variants: None Review of the MIP images confirms the above findings IMPRESSION: No large vessel occlusion, hemodynamically significant stenosis, or evidence of dissection. Electronically Signed   By: Guadlupe Spanish M.D.   On: 06/15/2019 20:04   DG Ankle Left Port  Result Date: 06/16/2019 CLINICAL DATA:  Fall.  Pain and swelling. EXAM: PORTABLE LEFT ANKLE - 2 VIEW COMPARISON:  No prior. FINDINGS: No acute bony or joint abnormality. No evidence of fracture or dislocation. IMPRESSION: No acute abnormality. Electronically Signed   By: Maisie Fus  Register   On: 06/16/2019 06:44   CT HEAD CODE STROKE WO CONTRAST  Result Date: 06/15/2019 CLINICAL DATA:  Code stroke.  Patent unresponsive EXAM: CT HEAD WITHOUT CONTRAST TECHNIQUE: Contiguous axial images were obtained from the base of the skull through the vertex without intravenous contrast. COMPARISON:  August 27, 2018 FINDINGS: Brain: No  evidence of acute infarction, hemorrhage, hydrocephalus, extra-axial collection or mass lesion/mass effect. Vascular: No hyperdense vessel or unexpected calcification. Skull: Unremarkable. Sinuses/Orbits: No acute finding. Other: None. ASPECTS ([place] Stroke Program  Early CT Score) - Ganglionic level infarction (caudate, lentiform nuclei, internal capsule, insula, M1-M3 cortex): 7 - Supraganglionic infarction (M4-M6 cortex): 3 Total score (0-10 with 10 being normal): 10 IMPRESSION: Intracranial hemorrhage, mass effect, or evidence of acute infarction. ASPECTS is 10. Electronically Signed   By: Guadlupe Spanish M.D.   On: 06/15/2019 19:07   Transthoracic Echocardiogram  00/00/2020 Pending  ECG - SR . (See cardiology reading for complete details)   EEG This study is within normal limits. No seizures or epileptiform discharges were seen throughout the recording.  PHYSICAL EXAM Blood pressure 130/81, pulse 88, temperature 97.8 F (36.6 C), temperature source Oral, resp. rate 20, height  (1.575 m), weight 91.6 kg, SpO2 100 %, unknown if currently breastfeeding. General- well nourished while middle aged lady in no acute distress HEENT-  [place]/AT Lungs - Respirations unlabored Extremities - No edema  Neurologic Examination: Mental Status: Awake and alert. Fully oriented, thought content appropriate.  Speech fluent with intact comprehension and naming.   Cranial Nerves: II:  Visual fields intact. Pupils 2 mm and equally reactive bilaterally.  III,IV, VI: No ptosis. EOMI without nystagmus.  V,VII: Smile symmetric. Facial sensation normal, equal. VIII: hearing intact to conversation IX,X: Palate rises symmetrically XI: Symmetric shoulder shrug XII: midline tongue extension  Motor: RUE 5/5 RLE 5/5 LUE and LLE 5/5.  Diminished fine finger movements on the right orbit left over right upper extremity.  Symmetric grip strength Sensory: intact to light touch throughout. No extinction.  Deep Tendon Reflexes:  2+ bilateral biceps and brachioradialis 3+ patellae bilaterally 2+ achilles bilaterally  Plantars: Right: downgoing                                Left: downgoing Cerebellar: No ataxia with FNF bilaterally  Gait: Deferred NIH stroke scale 0.   Premorbid modified Rankin scale 0 HOME MEDICATIONS:  Medications Prior to Admission  Medication Sig Dispense Refill  . AIMOVIG 140 MG/ML SOAJ Inject 140 mg into the skin every 30 (thirty) days.    Marland Kitchen aspirin EC 81 MG tablet Take 182 mg by mouth daily.    . baclofen (LIORESAL) 10 MG tablet Take 10 mg by mouth 3 (three) times daily as needed.    Marland Kitchen BIOTIN PO Take 1 capsule by mouth daily.    . cholecalciferol (VITAMIN D3) 25 MCG (1000 UT) tablet Take 1,000 Units by mouth daily.    Marland Kitchen levothyroxine (SYNTHROID, LEVOTHROID) 50 MCG tablet Take 50 mcg by mouth daily before breakfast.    . Magnesium 400 MG CAPS Take 400 mg by mouth daily.    . metoprolol succinate (TOPROL-XL) 50 MG 24 hr tablet Take 100 mg by mouth daily. Take with or immediately following a meal.    . NURTEC 75 MG TBDP Take 1 tablet by mouth daily as needed.    Marland Kitchen omeprazole (PRILOSEC) 40 MG capsule Take 40 mg by mouth every other day.     . prochlorperazine (COMPAZINE) 10 MG tablet Take 10 mg by mouth every 8 (eight) hours as needed.    . SUMAtriptan (IMITREX) 100 MG tablet Take 100 mg by mouth every 2 (two) hours as needed for migraine. May repeat in 2 hours if headache persists or recurs.    Marland Kitchen  TROKENDI XR 100 MG CP24 Take 1 capsule by mouth daily.    . valACYclovir (VALTREX) 1000 MG tablet Take 1,000 mg by mouth daily as needed (flare up).     . venlafaxine XR (EFFEXOR-XR) 150 MG 24 hr capsule Take 150 mg by mouth daily.    Marland Kitchen zolpidem (AMBIEN) 10 MG tablet Take 10 mg by mouth at bedtime as needed for sleep.        HOSPITAL MEDICATIONS:  . Chlorhexidine Gluconate Cloth  6 each Topical Daily  . [START ON 06/17/2019] influenza vac split quadrivalent PF  0.5 mL Intramuscular Tomorrow-1000  . levothyroxine  50 mcg Oral QAC breakfast  . metoprolol succinate  100 mg Oral Daily  . pantoprazole (PROTONIX) IV  40 mg Intravenous QHS  . venlafaxine XR  150 mg Oral Daily    ALLERGIES Allergies  Allergen Reactions  . Tape Hives     Adhesive tape - tegaderm/paper tape ok  . Dilaudid [Hydromorphone Hcl] Itching    "I just about crawled out of my skin"  . Prochlorperazine Other (See Comments)    Akathisia with compazine in ED on 05/17/19.  Resolved with repeat dose of benadryl.     . Quinolones Nausea Only    ASSESSMENT/PLAN Carmen [name] is a 38 y.o. female with history of syncope with pacer, depression, GERD, migrains. Presenting with acute stroke-like symptoms to outside hospial. She received IV t-PA and was transferred to [facility] ([place] for further neurologic care.  Stroke: pending wk up  Resultant  - currently resolved. Presenting symptoms were right side weakness, mute and collapse at home  Code Stroke CT Head -    ASPECTS not done here  CT head - neg  MRI head- pending  MRA head - n/a  CTA H&N -No LVO or stenosis seen.  CT Perfusion- not done at outside hospital  Carotid Doppler - pending  2D Echo - pending  Sars Corona Virus 2  neg  LDL - 142    Component Value Date/Time   LDLCALC UNABLE TO CALCULATE IF TRIGLYCERIDE OVER 400 mg/dL 06/16/2019 0616     HgbA1c - 5.1  UDS neg  VTE prophylaxis - s/p tPA SCDs only for now Diet  Diet Order            Diet Heart Room service appropriate? Yes with Assist; Fluid consistency: Thin  Diet effective now               ASA prior to admission, post IV tpA, will plan to start ASA + Plavix for 3weeks, then monotherapy with Plavix alone  Patient counseled to be compliant with her antithrombotic medications  Ongoing aggressive stroke risk factor management  Therapy recommendations:  pending  Disposition:  Pending  Hypertension- no prev history. However, she does have history of hypotension and has previously been on florinef.  Home BP meds: none   Current BP meds: none   Stable . Permissive hypertension (OK if < 180 SBP)  . Long-term BP goal normotensive  Hyperlipidemia  Home Lipid lowering medication: none   LDL 142, goal < 70  Current  lipid lowering medication: Lipitor 40 mg daily started today  Continue statin at discharge  Diabetes  Home diabetic meds: none  Current diabetic meds: SSI   HgbA1c 5.1, goal < 7.0 Recent Labs    06/15/19 1854  GLUCAP 82     Other Stroke Risk Factors  Obesity, Body mass index is 36.94 kg/m., recommend weight loss, diet and exercise as appropriate  Family hx stroke and CV disease  Migraines w/aura  Possible Obstructive sleep apnea, not on CPAP at home. 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  Hospital day # 1  Desiree Metzger-Cihelka, ARNP-C, ANVP-BC Pager: [phone]  I have personally obtained history,examined this patient, reviewed notes, independently viewed imaging studies, participated in medical decision making and plan of care.ROS completed by me personally and pertinent positives fully documented  I have made any additions or clarifications directly to the above note. Agree with note above.  She presented with right hemiplegia and speech difficulties and received IV TPA and has made complete recovery.  Continue close neurological monitoring and strict blood pressure control as per post TPA protocol.  Check MRI scan of the brain later today, echocardiogram and ongoing stroke work-up.  Mobilize out of bed.  Therapy consults.  Resume home medications if able to swallow safely.  Continue cardiac telemetry monitoring.This patient is critically ill and at significant risk of neurological worsening, death and care requires constant monitoring of vital signs, hemodynamics,respiratory and cardiac monitoring, extensive review of multiple databases, frequent neurological assessment, discussion with family, other specialists and medical decision making of high complexity.I  have made any additions or clarifications directly to the above note.This critical care time does not reflect procedure time, or teaching time or supervisory time of PA/NP/Med Resident etc but could involve care discussion time.  I spent 30 minutes of neurocritical care time  in the care of  this patient.      Delia Heady, MD Medical Director [facility] Pager: [phone] 06/16/2019 2:17 PM  To contact Stroke Continuity provider, please refer to [email or web address]. After hours, contact General Neurology

## 2019-06-17 MED ORDER — ASPIRIN 325 MG PO TABS
325.0000 mg | ORAL_TABLET | Freq: Every day | ORAL | Status: DC
  Administered 2019-06-17 – 2019-06-18 (×2): 325 mg via ORAL
  Filled 2019-06-17 (×2): qty 1

## 2019-06-17 MED ORDER — ACETAMINOPHEN 325 MG PO TABS: 650.0000 mg | ORAL_TABLET | ORAL | Status: DC | PRN

## 2019-06-17 MED ORDER — FENOFIBRATE 160 MG PO TABS: 160.0000 mg | ORAL_TABLET | Freq: Every day | ORAL | 1 refills | Status: DC

## 2019-06-17 MED ORDER — ATORVASTATIN CALCIUM 80 MG PO TABS
80.0000 mg | ORAL_TABLET | Freq: Every day | ORAL | Status: DC
  Administered 2019-06-17: 80 mg via ORAL
  Filled 2019-06-17: qty 1

## 2019-06-17 MED ORDER — FENOFIBRATE 160 MG PO TABS
160.0000 mg | ORAL_TABLET | Freq: Every day | ORAL | Status: DC
  Administered 2019-06-17 – 2019-06-18 (×2): 160 mg via ORAL
  Filled 2019-06-17: qty 1

## 2019-06-17 MED ORDER — PANTOPRAZOLE SODIUM 40 MG PO TBEC
40.0000 mg | DELAYED_RELEASE_TABLET | Freq: Every day | ORAL | Status: DC
  Administered 2019-06-17: 40 mg via ORAL
  Filled 2019-06-17: qty 1

## 2019-06-17 MED ORDER — ATORVASTATIN CALCIUM 80 MG PO TABS: 80.0000 mg | ORAL_TABLET | Freq: Every day | ORAL | 1 refills | Status: DC

## 2019-06-17 NOTE — Evaluation (Addendum)
Physical Therapy Evaluation Patient Details Name: Carmen [name] MRN: [id number] DOB: 1980/12/16 Today's Date: 06/17/2019   History of Present Illness  Carmen [name] is a 38 y.o. female with history of syncope with pacer, depression, GERD, migraine. Presents with right sided weakness, right facial droop, and headache. Decision was made to administer tPA. MRI negative for acute abnormality. Working diagnosis of conversion disorder vs aborted stroke vs TIA.  Clinical Impression  Patient evaluated by Physical Therapy with no further acute PT needs identified. Pt denies residual deficits; does report mild left ankle pain just proximal to lateral malleolus, which she believes may be a result of fall. No significant pain with weightbearing or tender to palpation; mild discomfort with dorsiflexion and eversion. Encouraged ice and elevation. Pt ambulating hallway distances and negotiated 20 steps without physical difficulty. Scored 21/24 on Dynamic Gait Index, indicating she is not at high risk for falls. All education has been completed and the patient has no further questions. No follow-up Physical Therapy or equipment needs. PT is signing off. Thank you for this referral.     Follow Up Recommendations No PT follow up    Equipment Recommendations  None recommended by PT    Recommendations for Other Services       Precautions / Restrictions Precautions Precautions: None Restrictions Weight Bearing Restrictions: No      Mobility  Bed Mobility Overal bed mobility: Independent                Transfers Overall transfer level: Independent Equipment used: None                Ambulation/Gait Ambulation/Gait assistance: Independent Gait Distance (Feet): 300 Feet Assistive device: None Gait Pattern/deviations: Step-through pattern     General Gait Details: Pt with slightly slower speed for age, but overall no evidence of gross imbalance with challenge  Stairs Stairs:  Yes Stairs assistance: Modified independent (Device/Increase time) Stair Management: One rail Right Number of Stairs: 20 General stair comments: step over step pattern  Wheelchair Mobility    Modified Rankin (Stroke Patients Only) Modified Rankin (Stroke Patients Only) Pre-Morbid Rankin Score: No symptoms Modified Rankin: No significant disability     Balance Overall balance assessment: Independent                               Standardized Balance Assessment Standardized Balance Assessment : Dynamic Gait Index   Dynamic Gait Index Level Surface: Mild Impairment Change in Gait Speed: Normal Gait with Horizontal Head Turns: Normal Gait with Vertical Head Turns: Normal Gait and Pivot Turn: Normal Step Over Obstacle: Normal Step Around Obstacles: Mild Impairment Steps: Mild Impairment Total Score: 21       Pertinent Vitals/Pain Pain Assessment: Faces Faces Pain Scale: Hurts a little bit Pain Location: Just proximal to left lateral malleolus Pain Descriptors / Indicators: Discomfort Pain Intervention(s): Monitored during session    Home Living Family/patient expects to be discharged to:: Private residence Living Arrangements: Children(12 y.o. son) Available Help at Discharge: Friend(s)(boyfriend) Type of Home: Apartment Home Access: Stairs to enter(2 flights)     Home Layout: One level        Prior Function Level of Independence: Independent         Comments: Recently working as a Ship broker, but unfortunately laid off 2 weeks ago     Hand Dominance        Extremity/Trunk Assessment   Upper Extremity Assessment  Upper Extremity Assessment: RUE deficits/detail;LUE deficits/detail RUE Deficits / Details: Strength 5/5 LUE Deficits / Details: Strength 5/5    Lower Extremity Assessment Lower Extremity Assessment: RLE deficits/detail;LLE deficits/detail RLE Deficits / Details: Strength 5/5 LLE Deficits / Details: Strength 5/5        Communication   Communication: No difficulties  Cognition Arousal/Alertness: Awake/alert Behavior During Therapy: WFL for tasks assessed/performed Overall Cognitive Status: Within Functional Limits for tasks assessed                                        General Comments      Exercises     Assessment/Plan    PT Assessment Patent does not need any further PT services  PT Problem List Decreased strength;Decreased mobility;Decreased balance       PT Treatment Interventions      PT Goals (Current goals can be found in the Care Plan section)  Acute Rehab PT Goals Patient Stated Goal: "get a new job." PT Goal Formulation: All assessment and education complete, DC therapy    Frequency     Barriers to discharge        Co-evaluation               AM-PAC PT "6 Clicks" Mobility  Outcome Measure Help needed turning from your back to your side while in a flat bed without using bedrails?: None Help needed moving from lying on your back to sitting on the side of a flat bed without using bedrails?: None Help needed moving to and from a bed to a chair (including a wheelchair)?: None Help needed standing up from a chair using your arms (e.g., wheelchair or bedside chair)?: None Help needed to walk in hospital room?: None Help needed climbing 3-5 steps with a railing? : None 6 Click Score: 24    End of Session   Activity Tolerance: Patient tolerated treatment well Patient left: in bed;with call bell/phone within reach Nurse Communication: Mobility status PT Visit Diagnosis: Other symptoms and signs involving the nervous system (R29.898)    Time: 9381-0175 PT Time Calculation (min) (ACUTE ONLY): 27 min   Charges:   PT Evaluation $PT Eval Moderate Complexity: 1 Mod PT Treatments $Therapeutic Activity: 8-22 mins        Ellamae Sia, PT, DPT Acute Rehabilitation Services Pager [phone] Office [phone]   Willy Eddy 06/17/2019,  12:15 PM
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
## 2019-06-17 NOTE — TOC Transition Note (Signed)
Transition of Care [facility]) - CM/SW Discharge Note   Patient Details  Name: Carmen [name] MRN: [id number] Date of Birth: [birth year]  Transition of Care [facility]) CM/SW Contact:  Pollie Friar, RN Phone Number: 06/17/2019, 11:02 AM   Clinical Narrative:    Patient is from home with son. PCP listed but she currently doesn't see him as he has moved to [place], [place]. CM inquired about a [facility] and she was interested. Appt made at [facility] and information on the AVS. Pt also encouraged to use [facility] pharmacy to assist with the cost of her meds.  CM following to see medications discharged on to see if she needs a MATCH or assist with the cost.      Barriers to Discharge: Inadequate or no insurance, Barriers Unresolved (comment)   Patient Goals and CMS Choice        Discharge Placement                       Discharge Plan and Services In-house Referral: Clinical Social Work Discharge Planning Services: CM Consult                                 Social Determinants of Health (SDOH) Interventions     Readmission Risk Interventions No flowsheet data found.
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
## 2019-06-17 NOTE — Progress Notes (Deleted)
  Orders acknowledged, chart reviewed.  Patient screened, she appears to be at baseline function. No speech/language or cognitive-communication concerns noted. Signed off. No SLP evaluation completed.   Marina Goodell, M.Ed., [place] Office: [phone]

## 2019-06-17 NOTE — Progress Notes (Signed)
SLP Cancellation Note  Patient Details Name: [name] MRN: [id number] DOB: Jan 04, 1981   Cancelled treatment:       Reason Eval/Treat Not Completed: SLP screened, no needs identified, will sign off   Deah Ottaway 06/17/2019, 11:54 AM

## 2019-06-17 NOTE — Progress Notes (Signed)
Occupational Therapy Evaluation Patient Details Name: Carmen [name] MRN: [id number] DOB: [birth year] Today's Date: 06/17/2019    History of Present Illness Ms. Carmen [name] is a 38 y.o. female with history of syncope with pacer, depression, GERD, migraine. Presents with right sided weakness, right facial droop, and headache. Decision was made to administer tPA. MRI negative for acute abnormality. Working diagnosis of conversion disorder vs aborted stroke vs TIA.   Clinical Impression   PTA, pt independent with all ADL/IADL and functional mobility. She was working until 2 weeks ago, when she was unfortunately laid off. Pt currently functioning at baseline and able to complete ADL independently. Discussed healthy coping strategies and stress management strategies to implement during pt's daily routines. Patient evaluated by Occupational Therapy with no further acute OT needs identified. All education has been completed and the patient has no further questions. See below for any follow-up Occupational Therapy or equipment needs. OT to sign off. Thank you for referral.      Follow Up Recommendations  No OT follow up    Equipment Recommendations  None recommended by OT    Recommendations for Other Services       Precautions / Restrictions Precautions Precautions: None Restrictions Weight Bearing Restrictions: No      Mobility Bed Mobility Overal bed mobility: Independent                Transfers Overall transfer level: Independent Equipment used: None                  Balance Overall balance assessment: Independent                               Standardized Balance Assessment Standardized Balance Assessment : Dynamic Gait Index   Dynamic Gait Index Level Surface: Mild Impairment Change in Gait Speed: Normal Gait with Horizontal Head Turns: Normal Gait with Vertical Head Turns: Normal Gait and Pivot Turn: Normal Step Over Obstacle: Normal Step  Around Obstacles: Mild Impairment Steps: Mild Impairment Total Score: 21     ADL either performed or assessed with clinical judgement   ADL Overall ADL's : Independent                                       General ADL Comments: pt able to demonstrate ability to complete all ADL/IADL with independence     Vision Baseline Vision/History: Wears glasses Wears Glasses: At all times Patient Visual Report: No change from baseline Vision Assessment?: No apparent visual deficits     Perception     Praxis      Pertinent Vitals/Pain Pain Assessment: Faces Faces Pain Scale: Hurts a little bit Pain Location: Just proximal to left lateral malleolus Pain Descriptors / Indicators: Discomfort Pain Intervention(s): Limited activity within patient's tolerance;Monitored during session     Hand Dominance Right   Extremity/Trunk Assessment Upper Extremity Assessment Upper Extremity Assessment: Overall WFL for tasks assessed RUE Deficits / Details: Strength 5/5 LUE Deficits / Details: Strength 5/5   Lower Extremity Assessment Lower Extremity Assessment: Defer to PT evaluation RLE Deficits / Details: Strength 5/5 LLE Deficits / Details: Strength 5/5   Cervical / Trunk Assessment Cervical / Trunk Assessment: Normal   Communication Communication Communication: No difficulties   Cognition Arousal/Alertness: Awake/alert Behavior During Therapy: WFL for tasks assessed/performed Overall Cognitive Status: Within Functional Limits for  tasks assessed                                     General Comments  discussed healthy coping strategies to implement during daily routine to de-stress    Exercises     Shoulder Instructions      Home Living Family/patient expects to be discharged to:: Private residence Living Arrangements: Children(12 y.o. son) Available Help at Discharge: Friend(s)(boyfriend) Type of Home: Apartment Home Access: Stairs to enter(2  flights)     Home Layout: One level     Bathroom Shower/Tub: Tub/shower unit                    Prior Functioning/Environment Level of Independence: Independent        Comments: Recently working as a Ship broker, but unfortunately laid off 2 weeks ago        OT Problem List: Decreased knowledge of precautions;Decreased strength;Impaired balance (sitting and/or standing)      OT Treatment/Interventions:      OT Goals(Current goals can be found in the care plan section) Acute Rehab OT Goals Patient Stated Goal: "get a new job." OT Goal Formulation: With patient Time For Goal Achievement: 07/01/19 Potential to Achieve Goals: Good  OT Frequency:     Barriers to D/C:            Co-evaluation              AM-PAC OT "6 Clicks" Daily Activity     Outcome Measure Help from another person eating meals?: None Help from another person taking care of personal grooming?: None Help from another person toileting, which includes using toliet, bedpan, or urinal?: None Help from another person bathing (including washing, rinsing, drying)?: None Help from another person to put on and taking off regular upper body clothing?: None Help from another person to put on and taking off regular lower body clothing?: None 6 Click Score: 24   End of Session Nurse Communication: Mobility status  Activity Tolerance: Patient tolerated treatment well Patient left: in bed;with call bell/phone within reach  OT Visit Diagnosis: Other abnormalities of gait and mobility (R26.89)                Time: 0102-7253 OT Time Calculation (min): 33 min Charges:  OT General Charges $OT Visit: 1 Visit OT Evaluation $OT Eval Low Complexity: 1 Low OT Treatments $Self Care/Home Management : 8-22 mins  Diona Browner OTR/L Acute Rehabilitation Services Office: [phone]   Rebeca Alert 06/17/2019, 12:27 PM

## 2019-06-17 NOTE — Progress Notes (Signed)
STROKE TEAM PROGRESS NOTE       INTERVAL HISTORY Her therapist is at the bedside.  Patient is still having some weakness at the hip while walking with the therapist.  MRI scan is negative for acute stroke.  She did participate in the sleep smart study and tested positive on the overnight Knox 3 monitor for sleep apnea.  She will have the CPAP titration mask tolerability test tonight. OBJECTIVE Vitals:   06/17/19 0013 06/17/19 0337 06/17/19 0819 06/17/19 1214  BP: 128/84 128/84 (!) 129/91 130/74  Pulse: 83 74 75 79  Resp: 18 18 18 18   Temp: 98.1 F (36.7 C) 98.4 F (36.9 C) 98.5 F (36.9 C) 98.8 F (37.1 C)  TempSrc: Oral Oral Oral Oral  SpO2: 96% 99% 99% 97%  Weight:      Height:        CBC:  Recent Labs  Lab 06/15/19 1848  WBC 6.1  NEUTROABS 3.9  HGB 14.3  HCT 42.3  MCV 92.4  PLT 263    Basic Metabolic Panel:  Recent Labs  Lab 06/15/19 1848  NA 139  K 3.8  CL 110  CO2 22  GLUCOSE 94  BUN 12  CREATININE 0.78  CALCIUM 9.1    Lipid Panel:     Component Value Date/Time   CHOL 231 (H) 06/16/2019 0616   TRIG 490 (H) 06/16/2019 0616   HDL 30 (L) 06/16/2019 0616   CHOLHDL 7.7 06/16/2019 0616   VLDL UNABLE TO CALCULATE IF TRIGLYCERIDE OVER 400 mg/dL 16/10/960412/22/2020 54090616   LDLCALC UNABLE TO CALCULATE IF TRIGLYCERIDE OVER 400 mg/dL 81/19/147812/22/2020 [id number]   [id number]:  Lab Results  Component Value Date   HGBA1C 5.1 06/16/2019   Urine Drug Screen: No results found for: LABOPIA, COCAINSCRNUR, LABBENZ, AMPHETMU, THCU, LABBARB  Alcohol Level     Component Value Date/Time   [facility] 06/15/2019 1847    IMAGING   EEG  Result Date: 06/16/2019 Charlsie QuestYadav, Priyanka O, MD     06/16/2019  8:47 AM Patient Name: Carmen [name] MRN: [id number] Epilepsy Attending: Charlsie QuestPriyanka O Yadav Referring Physician/Provider: Dr. Caryl PinaEric Lindzen Date: 06/15/2019 Duration: 21.49 minutes Patient history: 38 year old female who presented with lateralized weakness and unresponsiveness.  EEG to evaluate for  seizures. Level of alertness: Awake AEDs during EEG study: None Technical aspects: This EEG study was done with scalp electrodes positioned according to the 10-20 International system of electrode placement. Electrical activity was acquired at a sampling rate of 500Hz  and reviewed with a high frequency filter of 70Hz  and a low frequency filter of 1Hz . EEG data were recorded continuously and digitally stored. Description: The posterior dominant rhythm consists of 10 Hz activity of moderate voltage (25-35 uV) seen predominantly in posterior head regions, symmetric and reactive to eye opening and eye closing. Hyperventilation and photic stimulation were not performed. IMPRESSION: This study is within normal limits. No seizures or epileptiform discharges were seen throughout the recording. Charlsie QuestPriyanka O Yadav   CT Angio Head W or Wo Contrast  Result Date: 06/15/2019 CLINICAL DATA:  Found unresponsive EXAM: CT ANGIOGRAPHY HEAD AND NECK TECHNIQUE: Multidetector CT imaging of the head and neck was performed using the standard protocol during bolus administration of intravenous contrast. Multiplanar CT image reconstructions and MIPs were obtained to evaluate the vascular anatomy. Carotid stenosis measurements (when applicable) are obtained utilizing NASCET criteria, using the distal internal carotid diameter as the denominator. CONTRAST:  100mL OMNIPAQUE IOHEXOL 350 MG/ML SOLN COMPARISON:  None. FINDINGS: CTA NECK FINDINGS Aortic arch:  Great vessel origins are patent. Right carotid system: Patent. No measurable stenosis or evidence of dissection. Left carotid system: Patent. No measurable stenosis or evidence of dissection. Vertebral arteries: Patent. The left vertebral artery is dominant with a likely congenitally diminutive right vertebral artery. Skeleton: Unremarkable. Other neck: No neck mass or adenopathy Upper chest: No apical lung mass. Review of the MIP images confirms the above findings CTA HEAD FINDINGS  Anterior circulation: Intracranial internal carotid arteries, middle cerebral arteries, and anterior cerebral arteries are patent. Posterior circulation: Intracranial vertebral arteries, basilar artery, and posterior cerebral arteries are patent. Bilateral posterior communicating arteries are present. Venous sinuses: As permitted by contrast timing, patent. Anatomic variants: None Review of the MIP images confirms the above findings IMPRESSION: No large vessel occlusion, hemodynamically significant stenosis, or evidence of dissection. Electronically Signed   By: Macy Mis M.D.   On: 06/15/2019 20:04   CT HEAD WO CONTRAST  Result Date: 06/16/2019 CLINICAL DATA:  Stroke follow-up EXAM: CT HEAD WITHOUT CONTRAST TECHNIQUE: Contiguous axial images were obtained from the base of the skull through the vertex without intravenous contrast. COMPARISON:  Brain MRI 06/16/2019. FINDINGS: Brain: There is no mass, hemorrhage or extra-axial collection. The size and configuration of the ventricles and extra-axial CSF spaces are normal. The brain parenchyma is normal, without acute or chronic infarction. Vascular: No abnormal hyperdensity of the major intracranial arteries or dural venous sinuses. No intracranial atherosclerosis. Skull: The visualized skull base, calvarium and extracranial soft tissues are normal. Sinuses/Orbits: No fluid levels or advanced mucosal thickening of the visualized paranasal sinuses. No mastoid or middle ear effusion. The orbits are normal. IMPRESSION: Normal head CT. Electronically Signed   By: Ulyses Jarred M.D.   On: 06/16/2019 23:16   CT Angio Neck W and/or Wo Contrast  Result Date: 06/15/2019 CLINICAL DATA:  Found unresponsive EXAM: CT ANGIOGRAPHY HEAD AND NECK TECHNIQUE: Multidetector CT imaging of the head and neck was performed using the standard protocol during bolus administration of intravenous contrast. Multiplanar CT image reconstructions and MIPs were obtained to evaluate the  vascular anatomy. Carotid stenosis measurements (when applicable) are obtained utilizing NASCET criteria, using the distal internal carotid diameter as the denominator. CONTRAST:  128mL OMNIPAQUE IOHEXOL 350 MG/ML SOLN COMPARISON:  None. FINDINGS: CTA NECK FINDINGS Aortic arch: Great vessel origins are patent. Right carotid system: Patent. No measurable stenosis or evidence of dissection. Left carotid system: Patent. No measurable stenosis or evidence of dissection. Vertebral arteries: Patent. The left vertebral artery is dominant with a likely congenitally diminutive right vertebral artery. Skeleton: Unremarkable. Other neck: No neck mass or adenopathy Upper chest: No apical lung mass. Review of the MIP images confirms the above findings CTA HEAD FINDINGS Anterior circulation: Intracranial internal carotid arteries, middle cerebral arteries, and anterior cerebral arteries are patent. Posterior circulation: Intracranial vertebral arteries, basilar artery, and posterior cerebral arteries are patent. Bilateral posterior communicating arteries are present. Venous sinuses: As permitted by contrast timing, patent. Anatomic variants: None Review of the MIP images confirms the above findings IMPRESSION: No large vessel occlusion, hemodynamically significant stenosis, or evidence of dissection. Electronically Signed   By: Macy Mis M.D.   On: 06/15/2019 20:04   MR BRAIN WO CONTRAST  Result Date: 06/16/2019 CLINICAL DATA:  Stroke follow-up EXAM: MRI HEAD WITHOUT CONTRAST TECHNIQUE: Multiplanar, multiecho pulse sequences of the brain and surrounding structures were obtained without intravenous contrast. COMPARISON:  Head CT 06/15/2019 FINDINGS: BRAIN: There is no acute infarct, acute hemorrhage or extra-axial collection. The white matter signal  is normal for the patient's age. The cerebral and cerebellar volume are age-appropriate. There is no hydrocephalus. The midline structures are normal. VASCULAR: The major  intracranial arterial and venous sinus flow voids are normal. Susceptibility-sensitive sequences show no chronic microhemorrhage or superficial siderosis. SKULL AND UPPER CERVICAL SPINE: Calvarial bone marrow signal is normal. There is no skull base mass. The visualized upper cervical spine and soft tissues are normal. SINUSES/ORBITS: There are no fluid levels or advanced mucosal thickening. The mastoid air cells and middle ear cavities are free of fluid. The orbits are normal. IMPRESSION: Normal MRI of the brain. Electronically Signed   By: Deatra Robinson M.D.   On: 06/16/2019 19:17   DG Ankle Left Port  Result Date: 06/16/2019 CLINICAL DATA:  Fall.  Pain and swelling. EXAM: PORTABLE LEFT ANKLE - 2 VIEW COMPARISON:  No prior. FINDINGS: No acute bony or joint abnormality. No evidence of fracture or dislocation. IMPRESSION: No acute abnormality. Electronically Signed   By: Maisie Fus  Register   On: 06/16/2019 06:44   ECHOCARDIOGRAM COMPLETE  Result Date: 06/16/2019   ECHOCARDIOGRAM REPORT   Patient Name:   PARA COSSEY [name] Date of Exam: 06/16/2019 Medical Rec #:  [id number]     Height:       62.0 in Accession #:    [id number]    Weight:       201.9 lb Date of Birth:  [birth year]     BSA:          1.92 m Patient Age:    38 years      BP:           112/55 mmHg Patient Gender: F             HR:           77 bpm. Exam Location:  Inpatient Procedure: 2D Echo, Cardiac Doppler and Color Doppler Indications:    Stroke 434.91 / I163.9  History:        Patient has no prior history of Echocardiogram examinations.                 Pacemaker, Signs/Symptoms:Syncope and Chest Pain; Risk                 Factors:Hypertension and Dyslipidemia. Hypothyroidism. GERD.                 Seizures.  Sonographer:    Elmarie Shiley Dance Referring Phys: [id number] ERIC LINDZEN IMPRESSIONS  1. Left ventricular ejection fraction, by visual estimation, is 60 to 65%. The left ventricle has normal function. There is no left ventricular hypertrophy.  2. The left  ventricle has no regional wall motion abnormalities.  3. Global right ventricle has normal systolic function.The right ventricular size is normal. No increase in right ventricular wall thickness.  4. Left atrial size was normal.  5. Right atrial size was normal.  6. The mitral valve is normal in structure. Trivial mitral valve regurgitation. No evidence of mitral stenosis.  7. The tricuspid valve is normal in structure. Tricuspid valve regurgitation is not demonstrated.  8. The aortic valve is normal in structure. Aortic valve regurgitation is not visualized. No evidence of aortic valve sclerosis or stenosis.  9. The pulmonic valve was normal in structure. Pulmonic valve regurgitation is not visualized. 10. Normal pulmonary artery systolic pressure. 11. A pacer wire is visualized. 12. The inferior vena cava is normal in size with greater than 50% respiratory variability, suggesting right atrial pressure of 3 mmHg. FINDINGS  Left Ventricle: Left ventricular ejection fraction, by visual estimation, is 60 to 65%. The left ventricle has normal function. The left ventricle has no regional wall motion abnormalities. The left ventricular internal cavity size was the left ventricle is normal in size. There is no left ventricular hypertrophy. Left ventricular diastolic parameters were normal. Normal left atrial pressure. Right Ventricle: The right ventricular size is normal. No increase in right ventricular wall thickness. Global RV systolic function is has normal systolic function. The tricuspid regurgitant velocity is 2.08 m/s, and with an assumed right atrial pressure  of 3 mmHg, the estimated right ventricular systolic pressure is normal at 20.4 mmHg. Left Atrium: Left atrial size was normal in size. Right Atrium: Right atrial size was normal in size Pericardium: There is no evidence of pericardial effusion. Mitral Valve: The mitral valve is normal in structure. Trivial mitral valve regurgitation. No evidence of mitral  valve stenosis by observation. Tricuspid Valve: The tricuspid valve is normal in structure. Tricuspid valve regurgitation is not demonstrated. Aortic Valve: The aortic valve is normal in structure. Aortic valve regurgitation is not visualized. The aortic valve is structurally normal, with no evidence of sclerosis or stenosis. Pulmonic Valve: The pulmonic valve was normal in structure. Pulmonic valve regurgitation is not visualized. Pulmonic regurgitation is not visualized. Aorta: The aortic root, ascending aorta and aortic arch are all structurally normal, with no evidence of dilitation or obstruction. Venous: The inferior vena cava is normal in size with greater than 50% respiratory variability, suggesting right atrial pressure of 3 mmHg. IAS/Shunts: No atrial level shunt detected by color flow Doppler. There is no evidence of a patent foramen ovale. No ventricular septal defect is seen or detected. There is no evidence of an atrial septal defect. Additional Comments: A pacer wire is visualized.  LEFT VENTRICLE PLAX 2D LVIDd:         4.45 cm  Diastology LVIDs:         2.90 cm  LV e' lateral:   9.57 cm/s LV PW:         0.90 cm  LV E/e' lateral: 10.8 LV IVS:        1.00 cm  LV e' medial:    10.40 cm/s LVOT diam:     1.90 cm  LV E/e' medial:  9.9 LV SV:         58 ml LV SV Index:   28.26 LVOT Area:     2.84 cm  RIGHT VENTRICLE             IVC RV Basal diam:  2.00 cm     IVC diam: 1.70 cm RV S prime:     11.10 cm/s TAPSE (M-mode): 2.2 cm LEFT ATRIUM             Index       RIGHT ATRIUM          Index LA diam:        3.90 cm 2.03 cm/m  RA Area:     9.60 cm LA Vol (A2C):   50.5 ml 26.31 ml/m RA Volume:   18.50 ml 9.64 ml/m LA Vol (A4C):   52.0 ml 27.09 ml/m LA Biplane Vol: 54.4 ml 28.34 ml/m  AORTIC VALVE LVOT Vmax:   104.75 cm/s LVOT Vmean:  70.100 cm/s LVOT VTI:    0.205 m  AORTA Ao Root diam: 3.20 cm Ao Asc diam:  3.00 cm MITRAL VALVE  TRICUSPID VALVE MV Area (PHT): 3.72 cm               TR Peak grad:   17.4 mmHg MV PHT:        59.16 msec            TR Vmax:        215.00 cm/s MV Decel Time: 204 msec MV E velocity: 103.00 cm/s 103 cm/s  SHUNTS MV A velocity: 81.00 cm/s  70.3 cm/s Systemic VTI:  0.20 m MV E/A ratio:  1.27        1.5       Systemic Diam: 1.90 cm  Rachelle Hora Croitoru MD Electronically signed by Thurmon Fair MD Signature Date/Time: 06/16/2019/4:12:12 PM    Final    CT HEAD CODE STROKE WO CONTRAST  Result Date: 06/15/2019 CLINICAL DATA:  Code stroke.  Patent unresponsive EXAM: CT HEAD WITHOUT CONTRAST TECHNIQUE: Contiguous axial images were obtained from the base of the skull through the vertex without intravenous contrast. COMPARISON:  August 27, 2018 FINDINGS: Brain: No evidence of acute infarction, hemorrhage, hydrocephalus, extra-axial collection or mass lesion/mass effect. Vascular: No hyperdense vessel or unexpected calcification. Skull: Unremarkable. Sinuses/Orbits: No acute finding. Other: None. ASPECTS ([place]berta Stroke Program Early CT Score) - Ganglionic level infarction (caudate, lentiform nuclei, internal capsule, insula, M1-M3 cortex): 7 - Supraganglionic infarction (M4-M6 cortex): 3 Total score (0-10 with 10 being normal): 10 IMPRESSION: Intracranial hemorrhage, mass effect, or evidence of acute infarction. ASPECTS is 10. Electronically Signed   By: Guadlupe Spanish M.D.   On: 06/15/2019 19:07   Transthoracic Echocardiogram  00/00/2020 Pending  ECG - SR . (See cardiology reading for complete details)   EEG This study is within normal limits. No seizures or epileptiform discharges were seen throughout the recording.  PHYSICAL EXAM Blood pressure 130/74, pulse 79, temperature 98.8 F (37.1 C), temperature source Oral, resp. rate 18, height  (1.575 m), weight 91.6 kg, SpO2 97 %, unknown if currently breastfeeding. General- well nourished while middle aged lady in no acute distress HEENT-  /AT Lungs - Respirations unlabored Extremities - No  edema  Neurologic Examination: Mental Status: Awake and alert. Fully oriented, thought content appropriate.  Speech fluent with intact comprehension and naming.   Cranial Nerves: II:  Visual fields intact. Pupils 2 mm and equally reactive bilaterally.  III,IV, VI: No ptosis. EOMI without nystagmus.  V,VII: Smile symmetric. Facial sensation normal, equal. VIII: hearing intact to conversation IX,X: Palate rises symmetrically XI: Symmetric shoulder shrug XII: midline tongue extension  Motor: RUE 5/5 RLE 5/5 LUE and LLE 5/5.  Diminished fine finger movements on the right orbit left over right upper extremity.  Symmetric grip strength Sensory: intact to light touch throughout. No extinction.  Deep Tendon Reflexes:  2+ bilateral biceps and brachioradialis 3+ patellae bilaterally 2+ achilles bilaterally  Plantars: Right: downgoing                                Left: downgoing Cerebellar: No ataxia with FNF bilaterally  Gait: Deferred NIH stroke scale 0.  Premorbid modified Rankin scale 0 HOME MEDICATIONS:  Medications Prior to Admission  Medication Sig Dispense Refill  . AIMOVIG 140 MG/ML SOAJ Inject 140 mg into the skin every 30 (thirty) days.    Marland Kitchen aspirin EC 81 MG tablet Take 182 mg by mouth daily.    . baclofen (LIORESAL) 10 MG tablet Take 10 mg by mouth 3 (three)  times daily as needed.    Marland Kitchen BIOTIN PO Take 1 capsule by mouth daily.    . cholecalciferol (VITAMIN D3) 25 MCG (1000 UT) tablet Take 1,000 Units by mouth daily.    Marland Kitchen levothyroxine (SYNTHROID, LEVOTHROID) 50 MCG tablet Take 50 mcg by mouth daily before breakfast.    . Magnesium 400 MG CAPS Take 400 mg by mouth daily.    . metoprolol succinate (TOPROL-XL) 50 MG 24 hr tablet Take 100 mg by mouth daily. Take with or immediately following a meal.    . NURTEC 75 MG TBDP Take 1 tablet by mouth daily as needed.    Marland Kitchen omeprazole (PRILOSEC) 40 MG capsule Take 40 mg by mouth every other day.     . prochlorperazine (COMPAZINE) 10 MG  tablet Take 10 mg by mouth every 8 (eight) hours as needed.    . SUMAtriptan (IMITREX) 100 MG tablet Take 100 mg by mouth every 2 (two) hours as needed for migraine. May repeat in 2 hours if headache persists or recurs.    Marland Kitchen TROKENDI XR 100 MG CP24 Take 1 capsule by mouth daily.    . valACYclovir (VALTREX) 1000 MG tablet Take 1,000 mg by mouth daily as needed (flare up).     . venlafaxine XR (EFFEXOR-XR) 150 MG 24 hr capsule Take 150 mg by mouth daily.    Marland Kitchen zolpidem (AMBIEN) 10 MG tablet Take 10 mg by mouth at bedtime as needed for sleep.        HOSPITAL MEDICATIONS:  . atorvastatin  80 mg Oral q1800  . Chlorhexidine Gluconate Cloth  6 each Topical Daily  . fenofibrate  160 mg Oral Daily  . levothyroxine  50 mcg Oral QAC breakfast  . metoprolol succinate  100 mg Oral Daily  . pantoprazole (PROTONIX) IV  40 mg Intravenous QHS  . venlafaxine XR  150 mg Oral Daily    ALLERGIES Allergies  Allergen Reactions  . Tape Hives    Adhesive tape - tegaderm/paper tape ok  . Dilaudid [Hydromorphone Hcl] Itching    "I just about crawled out of my skin"  . Prochlorperazine Other (See Comments)    Akathisia with compazine in ED on 05/17/19.  Resolved with repeat dose of benadryl.     . Quinolones Nausea Only    ASSESSMENT/PLAN Ms. Carmen [name] is a 38 y.o. female with history of syncope with pacer, depression, GERD, migrains. Presenting with acute stroke-like symptoms to outside hospial. She received IV t-PA and was transferred to [facility] for further neurologic care.  Stroke: pending wk up  Resultant  - currently resolved. Presenting symptoms were right side weakness, mute and collapse at home  Code Stroke CT Head -    ASPECTS not done here  CT head - neg  MRI head- pending  MRA head - n/a  CTA H&N -No LVO or stenosis seen.  CT Perfusion- not done at outside hospital  Carotid Doppler -see CTA  2D Echo -normal ejection fraction no cardiac source of embolism  Sars Corona Virus 2   neg  LDL - 142    Component Value Date/Time   LDLCALC UNABLE TO CALCULATE IF TRIGLYCERIDE OVER 400 mg/dL 04/21/2535 6440    HKVQ2V - 5.1  UDS neg  VTE prophylaxis - s/p tPA SCDs only for now Diet  Diet Order            Diet Heart Room service appropriate? Yes with Assist; Fluid consistency: Thin  Diet effective now  ASA prior to admission, post IV tpA, will plan to start ASA + Plavix for 3weeks, then monotherapy with Plavix alone  Patient counseled to be compliant with her antithrombotic medications  Ongoing aggressive stroke risk factor management  Therapy recommendations: No follow-up necessary  disposition: Home Hypertension- no prev history. However, she does have history of hypotension and has previously been on florinef.  Home BP meds: none   Current BP meds: none   Stable . Permissive hypertension (OK if < 180 SBP)  . Long-term BP goal normotensive  Hyperlipidemia  Home Lipid lowering medication: none   LDL 142, goal < 70  Current lipid lowering medication: Lipitor 40 mg daily started today  Continue statin at discharge  Diabetes  Home diabetic meds: none  Current diabetic meds: SSI   HgbA1c 5.1, goal < 7.0 Recent Labs    06/15/19 1854  GLUCAP 82    Other Stroke Risk Factors  Obesity, Body mass index is 36.94 kg/m., recommend weight loss, diet and exercise as appropriate   Family hx stroke and CV disease  Migraines w/aura  Possible Obstructive sleep apnea, not on CPAP at home. 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  Hospital day # 2    She presented with right hemiplegia and speech difficulties and received IV TPA and has made complete recovery.  She will receive IV TPA for strokelike presentation but MRI is  negative for stroke.  Patient tested positive on overnight sleep monitor for sleep apnea and is participating in the sleep smart study and will have the CPAP mask titration tolerability test tonight.  Continue therapies and mobilize out of bed.  Likely discharge home in the morning.  Start aspirin 325 mg daily.  Add statin for elevated lipids greater than 50% time during this 25-minute visit was spent on counseling and coordination of care and discussion with care team      Delia Heady, MD Medical Director [facility] Pager: [phone] 06/17/2019 12:54 PM  To contact Stroke Continuity provider, please refer to [email or web address]. After hours, contact General Neurology

## 2019-06-18 DIAGNOSIS — E785 Hyperlipidemia, unspecified: Secondary | ICD-10-CM

## 2019-06-18 MED ORDER — ATORVASTATIN CALCIUM 80 MG PO TABS: 80.0000 mg | ORAL_TABLET | Freq: Every day | ORAL | 1 refills | Status: DC

## 2019-06-18 MED ORDER — FENOFIBRATE 160 MG PO TABS: 160.0000 mg | ORAL_TABLET | Freq: Every day | ORAL | 1 refills | Status: DC

## 2019-06-18 NOTE — Progress Notes (Signed)
Discharge instructions gone over with patient. All questions answered. Telemetry discontinued, no IV to remove. Patient clothes and cellphone returned to patient. Medication received from pharmacy and sent home with patient. Patient transported off unit via wheelchair. Friend arrived for transport home. Gwendolyn Grant, RN

## 2019-06-21 ENCOUNTER — Emergency Department (HOSPITAL_BASED_OUTPATIENT_CLINIC_OR_DEPARTMENT_OTHER)
Admission: EM | Admit: 2019-06-21 | Discharge: 2019-06-21 | Disposition: A | Discharge status: Home / Self Care | Payer: Medicaid Other | Attending: Emergency Medicine | Admitting: Emergency Medicine

## 2019-06-21 ENCOUNTER — Encounter (HOSPITAL_BASED_OUTPATIENT_CLINIC_OR_DEPARTMENT_OTHER): Payer: Self-pay | Admitting: *Deleted

## 2019-06-21 ENCOUNTER — Other Ambulatory Visit: Payer: Self-pay

## 2019-06-21 ENCOUNTER — Emergency Department (HOSPITAL_BASED_OUTPATIENT_CLINIC_OR_DEPARTMENT_OTHER): Payer: Medicaid Other

## 2019-06-21 DIAGNOSIS — M79604 Pain in right leg: Secondary | ICD-10-CM | POA: Insufficient documentation

## 2019-06-21 DIAGNOSIS — Z950 Presence of cardiac pacemaker: Secondary | ICD-10-CM | POA: Insufficient documentation

## 2019-06-21 DIAGNOSIS — Z7982 Long term (current) use of aspirin: Secondary | ICD-10-CM | POA: Insufficient documentation

## 2019-06-21 DIAGNOSIS — E039 Hypothyroidism, unspecified: Secondary | ICD-10-CM | POA: Insufficient documentation

## 2019-06-21 DIAGNOSIS — I10 Essential (primary) hypertension: Secondary | ICD-10-CM | POA: Diagnosis not present

## 2019-06-21 DIAGNOSIS — R202 Paresthesia of skin: Secondary | ICD-10-CM | POA: Diagnosis not present

## 2019-06-21 DIAGNOSIS — Z79899 Other long term (current) drug therapy: Secondary | ICD-10-CM | POA: Insufficient documentation

## 2019-06-21 LAB — COMPREHENSIVE METABOLIC PANEL
ALT: 21 U/L (ref 0–44)
AST: 19 U/L (ref 15–41)
Albumin: 4.7 g/dL (ref 3.5–5.0)
Alkaline Phosphatase: 74 U/L (ref 38–126)
Anion gap: 12 (ref 5–15)
BUN: 14 mg/dL (ref 6–20)
CO2: 21 mmol/L — ABNORMAL LOW (ref 22–32)
Calcium: 9.1 mg/dL (ref 8.9–10.3)
Chloride: 103 mmol/L (ref 98–111)
Creatinine, Ser: 0.79 mg/dL (ref 0.44–1.00)
GFR calc Af Amer: >60 mL/min (ref >60)
GFR calc non Af Amer: >60 mL/min (ref >60)
Glucose, Bld: 92 mg/dL (ref 70–99)
Potassium: 3.5 mmol/L (ref 3.5–5.1)
Sodium: 136 mmol/L (ref 135–145)
Total Bilirubin: 0.7 mg/dL (ref 0.3–1.2)
Total Protein: 7.9 g/dL (ref 6.5–8.1)

## 2019-06-21 LAB — CBC WITH DIFFERENTIAL/PLATELET
Abs Immature Granulocytes: 0.01 10*3/uL (ref 0.00–0.07)
Basophils Absolute: 0 10*3/uL (ref 0.0–0.1)
Basophils Relative: 1 %
Eosinophils Absolute: 0.3 10*3/uL (ref 0.0–0.5)
Eosinophils Relative: 4 %
HCT: 44.2 % (ref 36.0–46.0)
Hemoglobin: 14.9 g/dL (ref 12.0–15.0)
Immature Granulocytes: 0 %
Lymphocytes Relative: 25 %
Lymphs Abs: 1.8 10*3/uL (ref 0.7–4.0)
MCH: 31 pg (ref 26.0–34.0)
MCHC: 33.7 g/dL (ref 30.0–36.0)
MCV: 92.1 fL (ref 80.0–100.0)
Monocytes Absolute: 0.6 10*3/uL (ref 0.1–1.0)
Monocytes Relative: 8 %
Neutro Abs: 4.7 10*3/uL (ref 1.7–7.7)
Neutrophils Relative %: 62 %
Platelets: 224 10*3/uL (ref 150–400)
RBC: 4.8 MIL/uL (ref 3.87–5.11)
RDW: 12.4 % (ref 11.5–15.5)
WBC: 7.4 10*3/uL (ref 4.0–10.5)
nRBC: 0 % (ref 0.0–0.2)

## 2019-06-21 LAB — CK: Total CK: 56 U/L (ref 38–234)

## 2019-06-21 MED ORDER — ONDANSETRON HCL 8 MG PO TABS
4.0000 mg | ORAL_TABLET | Freq: Once | ORAL | Status: AC
  Administered 2019-06-21: 4 mg via ORAL
  Filled 2019-06-21: qty 1

## 2019-06-21 MED ORDER — HYDROCODONE-ACETAMINOPHEN 5-325 MG PO TABS: 1.0000 | ORAL_TABLET | Freq: Four times a day (QID) | ORAL | 0 refills | Status: DC | PRN

## 2019-06-21 MED ORDER — HYDROCODONE-ACETAMINOPHEN 5-325 MG PO TABS
2.0000 | ORAL_TABLET | Freq: Once | ORAL | Status: AC
  Administered 2019-06-21: 2 via ORAL
  Filled 2019-06-21: qty 2

## 2019-06-21 NOTE — Discharge Instructions (Signed)
Contact a health care provider if: Your symptoms change. Your symptoms get worse. You develop anxiety or depression.

## 2019-06-21 NOTE — ED Triage Notes (Signed)
Pt was seen here on 12/21 as code stroke. Today she began having pain in right leg. She is concerned for a clot

## 2019-06-21 NOTE — ED Provider Notes (Signed)
[facility] EMERGENCY DEPARTMENT Provider Note   CSN: [id number] Arrival date & time: 06/21/19  1658     History Chief Complaint  Patient presents with  . Leg Pain    Carmen [name] is a 38 y.o. female with a past medical history of neurocardiogenic syncope status post pacemaker placement, strokelike episode status post TPA administration earlier this month.  Hyperlipidemia on 80 mg of Lipitor who presents emergency department with chief complaint of leg pain and abnormality.  Patient states that when she awoke this morning she had diffuse neurosensory changes consisting of paresthesia and a sensation of heaviness in the lower extremity.  She had a little bit of difficulty ambulating.  Distribution of the sensation was in a stocking pattern on the right side.  She states that it seemed to go away however later on her dog brushed up against her pain at which point she developed severe pain in the right lower extremity again in the stocking distribution.  Since that time she states that she cannot tolerate any light touch to the leg without a sensation of severe pain.  She does not have any kind of problems with firm touch.  She is able to wear jeans and stockings on the leg however if she lays sheet across the lower extremity or something brushes up against her leg it causes her to hurt very badly.  She continues to feel a sensation of some heaviness in the leg.  She is on a daily aspirin but no other blood thinners.  She denies joint involvement, fever, chills, injury to the leg.  HPI     Past Medical History:  Diagnosis Date  . Anxiety   . Costochondritis 09/13/2010  . Depression   . GERD (gastroesophageal reflux disease)   . Headache    migrains  . Heart palpitations    long history of   . History of chest pain   . History of cholecystitis    Laparoscopic cholecystectomy  . History of fatigue   . History of kidney stones   . History of migraine headaches   . History of  seizure disorder    remote history of seizure activity and was evaluated at [facility] for this in the past  She is presently not on any seizure  medications and has not had any seizures for many years.  . History of tilt table evaluation    IMPRESSION: Positive tilt table study with significant cardioinhibitory  response.  Marland Kitchen Hyperlipidemia   . Hypertension   . Hypothyroidism   . Kidney stones    history of  . Neurocardiogenic syncope   . Pericarditis 09/13/2010  . Presence of permanent cardiac pacemaker   . PSVT (paroxysmal supraventricular tachycardia) (HCC)   . Sleeping difficulties   . Stress   . SVD (spontaneous vaginal delivery)    x 1  . Syncope and collapse    neurocardiogenic    Patient Active Problem List   Diagnosis Date Noted  . Hyperlipidemia 06/18/2019  . Stroke-like episode s/p tPA 06/15/2019  . Pelvic prolapse 11/17/2017  . Incontinence of urine in female 11/14/2017  . PSVT (paroxysmal supraventricular tachycardia) (HCC)   . Heart palpitations   . Syncope and collapse   . Sleeping difficulties   . Pericarditis 09/13/2010  . Syncope 09/13/2010  . Costochondritis 09/13/2010    Past Surgical History:  Procedure Laterality Date  . ANTERIOR AND POSTERIOR REPAIR N/A 11/14/2017   Procedure: ANTERIOR (CYSTOCELE) AND POSTERIOR REPAIR (RECTOCELE);  Surgeon: Delsa Bern, MD;  Location: [place] [facility];  Service: Gynecology;  Laterality: N/A;  . BILATERAL SALPINGECTOMY Bilateral 11/14/2017   Procedure: BILATERAL SALPINGECTOMY;  Surgeon: Delsa Bern, MD;  Location: [place] [facility];  Service: Gynecology;  Laterality: Bilateral;  . BLADDER SUSPENSION Bilateral 11/14/2017   Procedure: TRANSVAGINAL TAPE (TVT) PROCEDURE;  Surgeon: Everett Graff, MD;  Location: [place] [facility];  Service: Gynecology;  Laterality: Bilateral;  . CHOLECYSTECTOMY  11/2007   Cholecystitis - Laparoscopic cholecystectomy with intraoperative  cholangiogram.  . CHOLECYSTECTOMY    . CYSTOSCOPY N/A 11/14/2017   Procedure:  CYSTOSCOPY;  Surgeon: Everett Graff, MD;  Location: [place] [facility];  Service: Gynecology;  Laterality: N/A;  . PACEMAKER INSERTION  2016  . Tilt Table Study  07/17/2005   Positive tilt table study with significant cardioinhibitory  response.  Marland Kitchen VAGINAL HYSTERECTOMY N/A 11/14/2017   Procedure: HYSTERECTOMY VAGINAL;  Surgeon: Delsa Bern, MD;  Location: [facility] [facility];  Service: Gynecology;  Laterality: N/A;  . WISDOM TOOTH EXTRACTION       OB History    Gravida  2   Para  1   Term  1   Preterm      AB      Living  1     SAB      TAB      Ectopic      Multiple      Live Births              Family History  Problem Relation Age of Onset  . Hypertension Mother   . Heart disease Mother   . Cancer Mother        breast  . Heart attack Father   . Hyperlipidemia Father   . Hepatitis C Father   . Cancer Maternal Aunt        Breast    Social History   Tobacco Use  . Smoking status: Never Smoker  . Smokeless tobacco: Never Used  Substance Use Topics  . Alcohol use: Yes    Comment: rare  . Drug use: No    Home Medications Prior to Admission medications   Medication Sig Start Date End Date Taking? Authorizing Provider  AIMOVIG 140 MG/ML SOAJ Inject 140 mg into the skin every 30 (thirty) days. 05/26/19   [provider]  aspirin EC 81 MG tablet Take 182 mg by mouth daily.    [provider]  atorvastatin (LIPITOR) 80 MG tablet Take 1 tablet (80 mg total) by mouth daily at 6 PM. 06/18/19   Donzetta Starch, NP  baclofen (LIORESAL) 10 MG tablet Take 10 mg by mouth 3 (three) times daily as needed. 05/26/19   [provider]  BIOTIN PO Take 1 capsule by mouth daily.    [provider]  cholecalciferol (VITAMIN D3) 25 MCG (1000 UT) tablet Take 1,000 Units by mouth daily.    [provider]  fenofibrate 160 MG tablet Take 1 tablet (160 mg total) by mouth daily. 06/18/19   Donzetta Starch, NP  levothyroxine (SYNTHROID, LEVOTHROID) 50 MCG  tablet Take 50 mcg by mouth daily before breakfast.    [provider]  Magnesium 400 MG CAPS Take 400 mg by mouth daily.    [provider]  metoprolol succinate (TOPROL-XL) 50 MG 24 hr tablet Take 100 mg by mouth daily. Take with or immediately following a meal.    [provider]  NURTEC 75 MG TBDP Take 1 tablet by mouth daily as needed. 06/06/19  [provider]  omeprazole (PRILOSEC) 40 MG capsule Take 40 mg by mouth every other day.     [provider]  prochlorperazine (COMPAZINE) 10 MG tablet Take 10 mg by mouth every 8 (eight) hours as needed. 05/26/19   [provider]  SUMAtriptan (IMITREX) 100 MG tablet Take 100 mg by mouth every 2 (two) hours as needed for migraine. May repeat in 2 hours if headache persists or recurs.    [provider]  TROKENDI XR 100 MG CP24 Take 1 capsule by mouth daily. 05/14/19   [provider]  valACYclovir (VALTREX) 1000 MG tablet Take 1,000 mg by mouth daily as needed (flare up).  06/14/19   [provider]  venlafaxine XR (EFFEXOR-XR) 150 MG 24 hr capsule Take 150 mg by mouth daily.    [provider]  zolpidem (AMBIEN) 10 MG tablet Take 10 mg by mouth at bedtime as needed for sleep.    [provider]    Allergies    Tape, Dilaudid [hydromorphone hcl], Prochlorperazine, and Quinolones  Review of Systems   Review of Systems Ten systems reviewed and are negative for acute change, except as noted in the HPI.   Physical Exam Updated Vital Signs BP (!) 158/99 (BP Location: Left Arm)   Pulse (!) 101   Temp 98.9 F (37.2 C) (Oral)   Resp 18   Ht 5\' 2"  (1.575 m)   Wt 91.6 kg   LMP  (LMP Unknown)   SpO2 99%   Breastfeeding No   BMI 36.95 kg/m   Physical Exam Vitals and nursing note reviewed.  Constitutional:      General: She is not in acute distress.    Appearance: She is well-developed. She is not diaphoretic.  HENT:     Head: Normocephalic  and atraumatic.  Eyes:     General: No scleral icterus.    Conjunctiva/sclera: Conjunctivae normal.  Cardiovascular:     Rate and Rhythm: Normal rate and regular rhythm.     Heart sounds: Normal heart sounds. No murmur. No friction rub. No gallop.   Pulmonary:     Effort: Pulmonary effort is normal. No respiratory distress.     Breath sounds: Normal breath sounds.  Abdominal:     General: Bowel sounds are normal. There is no distension.     Palpations: Abdomen is soft. There is no mass.     Tenderness: There is no abdominal tenderness. There is no guarding.  Musculoskeletal:     Cervical back: Normal range of motion.     Right lower leg: No edema.     Left lower leg: No edema.     Comments: Lower extremities with equal size, equal temperature to touch.  2+ DP and PT pulse bilaterally.  No pitting edema.  Exquisitely tender to light touch on the right leg.  Skin:    General: Skin is warm and dry.  Neurological:     Mental Status: She is alert and oriented to person, place, and time.  Psychiatric:        Behavior: Behavior normal.     ED Results / Procedures / Treatments   Labs (all labs ordered are listed, but only abnormal results are displayed) Labs Reviewed  COMPREHENSIVE METABOLIC PANEL - Abnormal; Notable for the following components:      Result Value   CO2 21 (*)    All other components within normal limits  CBC WITH DIFFERENTIAL/PLATELET  CK    EKG None  Radiology  No results found.  Procedures Procedures (including critical care time)  Medications Ordered in ED Medications  HYDROcodone-acetaminophen (NORCO/VICODIN) 5-325 MG per tablet 2 tablet (2 tablets Oral Given 06/21/19 1746)  ondansetron (ZOFRAN) tablet 4 mg (4 mg Oral Given 06/21/19 1746)    ED Course  I have reviewed the triage vital signs and the nursing notes.  Pertinent labs & imaging results that were available during my care of the patient were reviewed by me and considered in my medical  decision making (see chart for details).  Clinical Course as of Jun 20 1900  Sun Jun 21, 2019  1827 CK Total: 56 [AH]    Clinical Course User Index [AH] Arthor Captain, PA-C   MDM Rules/Calculators/A&P                       CC:Leg pain VS: BP (!) 144/99 (BP Location: Right Arm)   Pulse 76   Temp 98 F (36.7 C) (Oral)   Resp 16   Ht 5\' 2"  (1.575 m)   Wt 91.6 kg   LMP  (LMP Unknown)   SpO2 100%   Breastfeeding No   BMI 36.95 kg/m  is gathered by patient  and EMR. DDX: differential diagnosis includes myositis, dvt, rhabdomyolysis, peripheral neuropathy, cold injury, complex regional pain syndrome. Labs: I reviewed the labs which show CK within normal limits.  CBC without any abnormalities.  CMP shows bicarb slightly low of insignificant value. Imaging: I personally reviewed the images (right lower extremity DVT ultrasound) which show(s) no evidence of DVT on my interpretation EKG: MDM: Patient with pain in the right leg followed by paresthesias this morning.  She has no other acute abnormalities.  I have very low suspicion for central cause. Given her story, complaint and regional pain I have highest suspicion for reflex sympathetic dystrophy.  I have no concern for DVT, arterial embolic event, poor perfusion.  She has a normal neurologic examination.  Patient given pain medication after review of PDMP.  I have referred the patient to outpatient neurology.  Case discussed with Dr. AG:TXMIWOE is agreement with work-up and plan for discharge at this time. Patient disposition: Discharge Patient condition: stable. The patient appears reasonably screened and/or stabilized for discharge and I doubt any other medical condition or other [facility] requiring further screening, evaluation, or treatment in the ED at this time prior to discharge. I have discussed lab and/or imaging findings with the patient and answered all questions/concerns to the best of my ability. I have discussed return  precautions and OP follow up.       Final Clinical Impression(s) / ED Diagnoses Final diagnoses:  None    Rx / DC Orders ED Discharge Orders    None       [facility], PA-C 06/21/19 2257    06/23/19, MD 06/22/19 1311

## 2019-06-24 ENCOUNTER — Emergency Department (INDEPENDENT_AMBULATORY_CARE_PROVIDER_SITE_OTHER): Payer: Medicaid Other

## 2019-06-24 ENCOUNTER — Other Ambulatory Visit: Payer: Self-pay

## 2019-06-24 ENCOUNTER — Emergency Department
Admission: EM | Admit: 2019-06-24 | Discharge: 2019-06-24 | Disposition: A | Discharge status: Home / Self Care | Payer: Medicaid Other | Source: Home / Self Care | Attending: Emergency Medicine | Admitting: Emergency Medicine

## 2019-06-24 DIAGNOSIS — R319 Hematuria, unspecified: Secondary | ICD-10-CM

## 2019-06-24 DIAGNOSIS — N200 Calculus of kidney: Secondary | ICD-10-CM | POA: Diagnosis not present

## 2019-06-24 DIAGNOSIS — N132 Hydronephrosis with renal and ureteral calculous obstruction: Secondary | ICD-10-CM

## 2019-06-24 DIAGNOSIS — R1031 Right lower quadrant pain: Secondary | ICD-10-CM

## 2019-06-24 LAB — POCT URINALYSIS DIP (MANUAL ENTRY)
Bilirubin, UA: NEGATIVE
Glucose, UA: NEGATIVE mg/dL
Ketones, POC UA: NEGATIVE mg/dL
Leukocytes, UA: NEGATIVE
Nitrite, UA: NEGATIVE
Protein Ur, POC: NEGATIVE mg/dL
Spec Grav, UA: 1.025 (ref 1.010–1.025)
Urobilinogen, UA: 0.2 E.U./dL
pH, UA: 6 (ref 5.0–8.0)

## 2019-06-24 LAB — POCT CBC W AUTO DIFF (K'VILLE URGENT CARE)

## 2019-06-24 MED ORDER — KETOROLAC TROMETHAMINE 30 MG/ML IJ SOLN: 30.0000 mg | Freq: Once | INTRAMUSCULAR | Status: DC

## 2019-06-24 MED ORDER — IOHEXOL 300 MG/ML  SOLN
100.0000 mL | Freq: Once | INTRAMUSCULAR | Status: AC | PRN
  Administered 2019-06-24: 13:00:00 100 mL via INTRAVENOUS

## 2019-06-24 NOTE — ED Provider Notes (Addendum)
[name]    CSN: [id number] Arrival date & time: 06/24/19  1032      History   Chief Complaint Chief Complaint  Patient presents with  . Urinary Tract Infection    HPI Carmen [name] is a 38 y.o. female.  Patient has a complicated history recently. HPI she was seen 06/15/2019 with facial drooping and inability to speak and was treated as an acute stroke with TPA.  There was a question of whether this was a complex migraine.  Then she was seen on 06/21/2019 with severe pain in her right leg.  She had concerned about a clot.  Imaging did not show a DVT.  For the last 2 days she has felt like she has not emptied her bladder completely.  She is going frequently in small amounts.  She is concerned whether this is residual from her suspected stroke or whether she could have a bladder infection.  Past Medical History:  Diagnosis Date  . Anxiety   . Costochondritis 09/13/2010  . Depression   . GERD (gastroesophageal reflux disease)   . Headache    migrains  . Heart palpitations    long history of   . History of chest pain   . History of cholecystitis    Laparoscopic cholecystectomy  . History of fatigue   . History of kidney stones   . History of migraine headaches   . History of seizure disorder    remote history of seizure activity and was evaluated at [facility] for this in the past  She is presently not on any seizure  medications and has not had any seizures for many years.  . History of tilt table evaluation    IMPRESSION: Positive tilt table study with significant cardioinhibitory  response.  Marland Kitchen Hyperlipidemia   . Hypertension   . Hypothyroidism   . Kidney stones    history of  . Neurocardiogenic syncope   . Pericarditis 09/13/2010  . Presence of permanent cardiac pacemaker   . PSVT (paroxysmal supraventricular tachycardia) (HCC)   . Sleeping difficulties   . Stress   . SVD (spontaneous vaginal delivery)    x 1  . Syncope and collapse    neurocardiogenic    Patient Active Problem List   Diagnosis Date Noted  . Hyperlipidemia 06/18/2019  . Stroke-like episode s/p tPA 06/15/2019  . Pelvic prolapse 11/17/2017  . Incontinence of urine in female 11/14/2017  . PSVT (paroxysmal supraventricular tachycardia) (HCC)   . Heart palpitations   . Syncope and collapse   . Sleeping difficulties   . Pericarditis 09/13/2010  . Syncope 09/13/2010  . Costochondritis 09/13/2010    Past Surgical History:  Procedure Laterality Date  . ANTERIOR AND POSTERIOR REPAIR N/A 11/14/2017   Procedure: ANTERIOR (CYSTOCELE) AND POSTERIOR REPAIR (RECTOCELE);  Surgeon: Silverio Lay, MD;  Location: [facility];  Service: Gynecology;  Laterality: N/A;  . BILATERAL SALPINGECTOMY Bilateral 11/14/2017   Procedure: BILATERAL SALPINGECTOMY;  Surgeon: Silverio Lay, MD;  Location: [facility];  Service: Gynecology;  Laterality: Bilateral;  . BLADDER SUSPENSION Bilateral 11/14/2017   Procedure: TRANSVAGINAL TAPE (TVT) PROCEDURE;  Surgeon: Osborn Coho, MD;  Location: [facility];  Service: Gynecology;  Laterality: Bilateral;  . CHOLECYSTECTOMY  11/2007   Cholecystitis - Laparoscopic cholecystectomy with intraoperative  cholangiogram.  . CHOLECYSTECTOMY    . CYSTOSCOPY N/A 11/14/2017   Procedure: CYSTOSCOPY;  Surgeon: Osborn Coho, MD;  Location: [facility];  Service: Gynecology;  Laterality: N/A;  . PACEMAKER INSERTION  2016  .  Tilt Table Study  07/17/2005   Positive tilt table study with significant cardioinhibitory  response.  Marland Kitchen. VAGINAL HYSTERECTOMY N/A 11/14/2017   Procedure: HYSTERECTOMY VAGINAL;  Surgeon: Silverio Layivard, Sandra, MD;  Location: [facility];  Service: Gynecology;  Laterality: N/A;  . WISDOM TOOTH EXTRACTION      OB History    Gravida  2   Para  1   Term  1   Preterm      AB      Living  1     SAB      TAB      Ectopic      Multiple      Live Births               Home Medications    Prior to Admission medications   Medication Sig  Start Date End Date Taking? Authorizing Provider  AIMOVIG 140 MG/ML SOAJ Inject 140 mg into the skin every 30 (thirty) days. 05/26/19   [provider]  aspirin EC 81 MG tablet Take 182 mg by mouth daily.    [provider]  atorvastatin (LIPITOR) 80 MG tablet Take 1 tablet (80 mg total) by mouth daily at 6 PM. 06/18/19   Layne BentonBiby, Sharon L, NP  baclofen (LIORESAL) 10 MG tablet Take 10 mg by mouth 3 (three) times daily as needed. 05/26/19   [provider]  BIOTIN PO Take 1 capsule by mouth daily.    [provider]  cholecalciferol (VITAMIN D3) 25 MCG (1000 UT) tablet Take 1,000 Units by mouth daily.    [provider]  fenofibrate 160 MG tablet Take 1 tablet (160 mg total) by mouth daily. 06/18/19   Layne BentonBiby, Sharon L, NP  HYDROcodone-acetaminophen (NORCO/VICODIN) 5-325 MG tablet Take 1 tablet by mouth every 6 (six) hours as needed. 06/21/19   Arthor CaptainHarris, Abigail, PA-C  levothyroxine (SYNTHROID, LEVOTHROID) 50 MCG tablet Take 50 mcg by mouth daily before breakfast.    [provider]  Magnesium 400 MG CAPS Take 400 mg by mouth daily.    [provider]  metoprolol succinate (TOPROL-XL) 50 MG 24 hr tablet Take 100 mg by mouth daily. Take with or immediately following a meal.    [provider]  NURTEC 75 MG TBDP Take 1 tablet by mouth daily as needed. 06/06/19   [provider]  omeprazole (PRILOSEC) 40 MG capsule Take 40 mg by mouth every other day.     [provider]  prochlorperazine (COMPAZINE) 10 MG tablet Take 10 mg by mouth every 8 (eight) hours as needed. 05/26/19   [provider]  SUMAtriptan (IMITREX) 100 MG tablet Take 100 mg by mouth every 2 (two) hours as needed for migraine. May repeat in 2 hours if headache persists or recurs.    [provider]  TROKENDI XR 100 MG CP24 Take 1 capsule by mouth daily. 05/14/19   [provider]  valACYclovir (VALTREX) 1000 MG tablet Take 1,000 mg by  mouth daily as needed (flare up).  06/14/19   [provider]  venlafaxine XR (EFFEXOR-XR) 150 MG 24 hr capsule Take 150 mg by mouth daily.    [provider]  zolpidem (AMBIEN) 10 MG tablet Take 10 mg by mouth at bedtime as needed for sleep.    [provider]    Family History Family History  Problem Relation Age of Onset  . Hypertension Mother   . Heart disease Mother   . Cancer Mother  breast  . Heart attack Father   . Hyperlipidemia Father   . Hepatitis C Father   . Cancer Maternal Aunt        Breast    Social History Social History   Tobacco Use  . Smoking status: Never Smoker  . Smokeless tobacco: Never Used  Substance Use Topics  . Alcohol use: Yes    Comment: rare  . Drug use: No     Allergies   Tape, Dilaudid [hydromorphone hcl], Prochlorperazine, and Quinolones   Review of Systems Review of Systems  Constitutional: Negative for fever.  HENT: Negative.   Respiratory: Negative.   Cardiovascular: Negative.   Gastrointestinal: Positive for abdominal pain, nausea and vomiting.  Genitourinary: Positive for decreased urine volume, difficulty urinating, dysuria and urgency. Negative for hematuria and vaginal bleeding.     Physical Exam Triage Vital Signs ED Triage Vitals [06/24/19 1046]  Enc Vitals Group     BP (!) 142/103     Pulse Rate 78     Resp 16     Temp (!) 97.5 F (36.4 C)     Temp Source Oral     SpO2 99 %     Weight      Height      Head Circumference      Peak Flow      Pain Score      Pain Loc      Pain Edu?      Excl. in GC?    No data found.  Updated Vital Signs BP (!) 142/103 (BP Location: Right Arm)   Pulse 78   Temp (!) 97.5 F (36.4 C) (Oral)   Resp 16   LMP  (LMP Unknown)   SpO2 99%   Visual Acuity Right Eye Distance:   Left Eye Distance:   Bilateral Distance:    Right Eye Near:   Left Eye Near:    Bilateral Near:     Physical Exam Constitutional:      Appearance: Normal  appearance.  Cardiovascular:     Rate and Rhythm: Normal rate and regular rhythm.     Comments: She has a pacemaker Pulmonary:     Effort: Pulmonary effort is normal.     Breath sounds: Normal breath sounds.  Abdominal:     Comments: Abdomen is flat there is tenderness along the right side of the abdomen into the right lower abdomen.  There is no rebound.  Abdomen is not distended  Genitourinary:    Comments: S/p hysterectomy. No adnexal masses Musculoskeletal:     Cervical back: Normal range of motion.     Comments: Full range of motion of the right leg.  Dorsalis pedis pulse posterior tibial pulse 2+  Skin:    General: Skin is warm and dry.  Neurological:     Mental Status: She is alert.    Nurse Lorayne Marek present during the pelvic exam.  UC Treatments / Results  Labs (all labs ordered are listed, but only abnormal results are displayed) Labs Reviewed  POCT URINALYSIS DIP (MANUAL ENTRY) - Abnormal; Notable for the following components:      Result Value   Blood, UA moderate (*)    All other components within normal limits  URINE CULTURE  POCT CBC W AUTO DIFF ([facility])    EKG   Radiology CT ABDOMEN PELVIS W CONTRAST  Result Date: 06/24/2019 CLINICAL DATA:  Abdominal pain EXAM: CT ABDOMEN AND PELVIS WITH CONTRAST TECHNIQUE: Multidetector CT imaging of the  abdomen and pelvis was performed using the standard protocol following bolus administration of intravenous contrast. CONTRAST:  125mL OMNIPAQUE IOHEXOL 300 MG/ML  SOLN COMPARISON:  11/07/2018 FINDINGS: Lower chest: No acute abnormality. Hepatobiliary: No focal liver abnormality is seen. Status post cholecystectomy. No biliary dilatation. Pancreas: Unremarkable. Spleen: Unremarkable. Adrenals/Urinary Tract: There is mild right hydronephrosis and perinephric stranding. The right ureter is dilated to just above the ureterovesical junction where there is an obstructing 6 mm calculus. There are punctate bilateral  nonobstructing renal calculi. Bladder is poorly distended and not well evaluated. Stomach/Bowel: Stomach is unremarkable. Bowel is normal in caliber. Normal appendix. Vascular/Lymphatic: No significant vascular findings are present. No enlarged abdominal or pelvic lymph nodes. Reproductive: Status post hysterectomy. No adnexal masses. Other: No abdominal wall hernia or abnormality. No abdominopelvic ascites. Musculoskeletal: No significant or acute osseous abnormality. IMPRESSION: 6 mm obstructing distal right ureteral calculus with proximal hydroureter and hydronephrosis. Punctate bilateral nonobstructing renal calculi. Electronically Signed   By: Macy Mis M.D.   On: 06/24/2019 12:51    Procedures Procedures (including critical care time)  Medications Ordered in UC Medications - No data to display  Initial Impression / Assessment and Plan / UC Course  I have reviewed the triage vital signs and the nursing notes. Patient seen with back and right lower abdominal discomfort with urinary symptoms of urgency.  She has had a hysterectomy.  I did not feel a mass in the right lower quadrant.  He does have right lower quadrant tenderness without definite rebound.  Bowel sounds are present.  White count has increased from 7400  To  10,700.  She has not had a bowel movement for 3 days.  We will proceed with CT abdomen pelvis with contrast.  She does have microscopic hematuria with  a history of previous kidney stones Pertinent labs & imaging results that were available during my care of the patient were reviewed by me and considered in my medical decision making (see chart for details). X-ray shows a 6 mm obstructing stone in the right distal ureter with hydronephrosis.  She was not given Toradol because she stated it makes her feel strange in her head.  Patient was given 10 hydrocodone 3 days ago so no  prescriptions were written. She has phenergan at home..  She could not be seen acutely today and has an  appointment at 9:30 in the morning to see Dr. Arnette Schaumann at [facility].I called personally to try and get an appt today but was unable to get patient seen until tomorrow morning at 9:30.     Final Clinical Impressions(s) / UC Diagnoses   Final diagnoses:  Hematuria, unspecified type  Right lower quadrant abdominal pain  Nephrolithiasis     Discharge Instructions     You have an appointment tomorrow morning at 930 with Dr. Leonia Reader pace Please strain your urine. You have hydrocodone at home to take for pain that was given to you in the emergency room. If you develop fever, severe pain, or persistent vomiting you will need to go to the emergency room at [facility] long    ED Prescriptions    None     I have reviewed the PDMP during this encounter.   Darlyne Russian, MD 06/24/19 [place], [place], MD 06/24/19 1649    Darlyne Russian, MD 06/24/19 [employer], MD 06/24/19 2101

## 2019-06-24 NOTE — ED Triage Notes (Signed)
UTI sxs x 2 days, inability to void completely. Also experiencing some adb pain that starts in lower back. Was hospitalized for stroke 12/21-12/24. Hasn't had a normal BM since either.

## 2019-06-24 NOTE — Discharge Instructions (Signed)
You have an appointment tomorrow morning at 31 with Dr. Leonia Reader Pace Please strain your urine. You have hydrocodone at home to take for pain that was given to you in the emergency room. If you develop fever, severe pain, or persistent vomiting you will need to go to the emergency room at [facility]

## 2019-06-25 ENCOUNTER — Ambulatory Visit (HOSPITAL_COMMUNITY): Payer: Medicaid Other | Admitting: Certified Registered Nurse Anesthetist

## 2019-06-25 ENCOUNTER — Other Ambulatory Visit: Payer: Self-pay | Admitting: Urology

## 2019-06-25 ENCOUNTER — Ambulatory Visit (HOSPITAL_COMMUNITY): Payer: Medicaid Other

## 2019-06-25 ENCOUNTER — Encounter (HOSPITAL_COMMUNITY): Payer: Self-pay | Admitting: Urology

## 2019-06-25 ENCOUNTER — Inpatient Hospital Stay (HOSPITAL_COMMUNITY): Admission: RE | Admit: 2019-06-25 | Payer: Self-pay | Source: Ambulatory Visit

## 2019-06-25 ENCOUNTER — Ambulatory Visit (HOSPITAL_COMMUNITY)
Admission: RE | Admit: 2019-06-25 | Discharge: 2019-06-25 | Disposition: A | Discharge status: Home / Self Care | Payer: Medicaid Other | Source: Other Acute Inpatient Hospital | Attending: Urology | Admitting: Urology

## 2019-06-25 ENCOUNTER — Encounter (HOSPITAL_COMMUNITY)
Admission: RE | Disposition: A | Discharge status: Home / Self Care | Payer: Self-pay | Source: Other Acute Inpatient Hospital | Attending: Urology

## 2019-06-25 DIAGNOSIS — Z6836 Body mass index (BMI) 36.0-36.9, adult: Secondary | ICD-10-CM | POA: Diagnosis not present

## 2019-06-25 DIAGNOSIS — E039 Hypothyroidism, unspecified: Secondary | ICD-10-CM | POA: Insufficient documentation

## 2019-06-25 DIAGNOSIS — F329 Major depressive disorder, single episode, unspecified: Secondary | ICD-10-CM | POA: Insufficient documentation

## 2019-06-25 DIAGNOSIS — I498 Other specified cardiac arrhythmias: Secondary | ICD-10-CM | POA: Diagnosis not present

## 2019-06-25 DIAGNOSIS — I10 Essential (primary) hypertension: Secondary | ICD-10-CM | POA: Insufficient documentation

## 2019-06-25 DIAGNOSIS — F988 Other specified behavioral and emotional disorders with onset usually occurring in childhood and adolescence: Secondary | ICD-10-CM | POA: Diagnosis not present

## 2019-06-25 DIAGNOSIS — F419 Anxiety disorder, unspecified: Secondary | ICD-10-CM | POA: Insufficient documentation

## 2019-06-25 DIAGNOSIS — Z20828 Contact with and (suspected) exposure to other viral communicable diseases: Secondary | ICD-10-CM | POA: Insufficient documentation

## 2019-06-25 DIAGNOSIS — E669 Obesity, unspecified: Secondary | ICD-10-CM | POA: Diagnosis not present

## 2019-06-25 DIAGNOSIS — Z7982 Long term (current) use of aspirin: Secondary | ICD-10-CM | POA: Insufficient documentation

## 2019-06-25 DIAGNOSIS — N132 Hydronephrosis with renal and ureteral calculous obstruction: Secondary | ICD-10-CM | POA: Insufficient documentation

## 2019-06-25 DIAGNOSIS — Z79899 Other long term (current) drug therapy: Secondary | ICD-10-CM | POA: Insufficient documentation

## 2019-06-25 DIAGNOSIS — Z950 Presence of cardiac pacemaker: Secondary | ICD-10-CM | POA: Diagnosis not present

## 2019-06-25 HISTORY — DX: Sleep apnea, unspecified: G47.30

## 2019-06-25 HISTORY — PX: CYSTOSCOPY WITH RETROGRADE PYELOGRAM, URETEROSCOPY AND STENT PLACEMENT: SHX5789

## 2019-06-25 LAB — RESPIRATORY PANEL BY RT PCR (FLU A&B, COVID)
Influenza A by PCR: NEGATIVE
Influenza B by PCR: NEGATIVE
SARS Coronavirus 2 by RT PCR: NEGATIVE

## 2019-06-25 SURGERY — CYSTOURETEROSCOPY, WITH RETROGRADE PYELOGRAM AND STENT INSERTION
Anesthesia: General | Laterality: Right

## 2019-06-25 MED ORDER — FENTANYL CITRATE (PF) 100 MCG/2ML IJ SOLN
INTRAMUSCULAR | Status: AC
  Filled 2019-06-25: qty 2

## 2019-06-25 MED ORDER — DEXAMETHASONE SODIUM PHOSPHATE 10 MG/ML IJ SOLN
INTRAMUSCULAR | Status: AC
  Filled 2019-06-25: qty 1

## 2019-06-25 MED ORDER — KETOROLAC TROMETHAMINE 30 MG/ML IJ SOLN
INTRAMUSCULAR | Status: DC | PRN
  Administered 2019-06-25: 30 mg via INTRAVENOUS

## 2019-06-25 MED ORDER — SODIUM CHLORIDE 0.9 % IR SOLN
Status: DC | PRN
  Administered 2019-06-25: 3000 mL

## 2019-06-25 MED ORDER — DIPHENHYDRAMINE HCL 50 MG/ML IJ SOLN
INTRAMUSCULAR | Status: DC | PRN
  Administered 2019-06-25: 12.5 mg via INTRAVENOUS

## 2019-06-25 MED ORDER — SENNOSIDES-DOCUSATE SODIUM 8.6-50 MG PO TABS: 2.0000 | ORAL_TABLET | Freq: Every day | ORAL | 1 refills | Status: AC | PRN

## 2019-06-25 MED ORDER — FENTANYL CITRATE (PF) 100 MCG/2ML IJ SOLN
INTRAMUSCULAR | Status: DC | PRN
  Administered 2019-06-25 (×2): 50 ug via INTRAVENOUS

## 2019-06-25 MED ORDER — TAMSULOSIN HCL 0.4 MG PO CAPS: 0.4000 mg | ORAL_CAPSULE | Freq: Every day | ORAL | 0 refills | Status: AC

## 2019-06-25 MED ORDER — LIDOCAINE 2% (20 MG/ML) 5 ML SYRINGE
INTRAMUSCULAR | Status: AC
  Filled 2019-06-25: qty 5

## 2019-06-25 MED ORDER — OXYBUTYNIN CHLORIDE 5 MG PO TABS: 5.0000 mg | ORAL_TABLET | Freq: Three times a day (TID) | ORAL | 0 refills | Status: DC | PRN

## 2019-06-25 MED ORDER — PROPOFOL 10 MG/ML IV BOLUS
INTRAVENOUS | Status: DC | PRN
  Administered 2019-06-25: 200 mg via INTRAVENOUS

## 2019-06-25 MED ORDER — PHENYLEPHRINE 40 MCG/ML (10ML) SYRINGE FOR IV PUSH (FOR BLOOD PRESSURE SUPPORT)
PREFILLED_SYRINGE | INTRAVENOUS | Status: AC
  Filled 2019-06-25: qty 10

## 2019-06-25 MED ORDER — ONDANSETRON HCL 4 MG/2ML IJ SOLN
INTRAMUSCULAR | Status: DC | PRN
  Administered 2019-06-25: 4 mg via INTRAVENOUS

## 2019-06-25 MED ORDER — PROPOFOL 10 MG/ML IV BOLUS
INTRAVENOUS | Status: AC
  Filled 2019-06-25: qty 20

## 2019-06-25 MED ORDER — DEXAMETHASONE SODIUM PHOSPHATE 10 MG/ML IJ SOLN
INTRAMUSCULAR | Status: DC | PRN
  Administered 2019-06-25: 10 mg via INTRAVENOUS

## 2019-06-25 MED ORDER — MIDAZOLAM HCL 5 MG/5ML IJ SOLN
INTRAMUSCULAR | Status: DC | PRN
  Administered 2019-06-25: 2 mg via INTRAVENOUS

## 2019-06-25 MED ORDER — ONDANSETRON HCL 4 MG/2ML IJ SOLN
INTRAMUSCULAR | Status: AC
  Filled 2019-06-25: qty 2

## 2019-06-25 MED ORDER — LACTATED RINGERS IV SOLN: INTRAVENOUS | Status: DC

## 2019-06-25 MED ORDER — LIDOCAINE 2% (20 MG/ML) 5 ML SYRINGE
INTRAMUSCULAR | Status: DC | PRN
  Administered 2019-06-25: 50 mg via INTRAVENOUS

## 2019-06-25 MED ORDER — ACETAMINOPHEN 500 MG PO TABS
1000.0000 mg | ORAL_TABLET | Freq: Once | ORAL | Status: AC
  Administered 2019-06-25: 14:00:00 1000 mg via ORAL
  Filled 2019-06-25: qty 2

## 2019-06-25 MED ORDER — EPHEDRINE 5 MG/ML INJ
INTRAVENOUS | Status: AC
  Filled 2019-06-25: qty 10

## 2019-06-25 MED ORDER — IOHEXOL 300 MG/ML  SOLN
INTRAMUSCULAR | Status: DC | PRN
  Administered 2019-06-25: 10 mL

## 2019-06-25 MED ORDER — FENTANYL CITRATE (PF) 100 MCG/2ML IJ SOLN: 25.0000 ug | INTRAMUSCULAR | Status: DC | PRN

## 2019-06-25 MED ORDER — CEFAZOLIN SODIUM-DEXTROSE 2-4 GM/100ML-% IV SOLN
2.0000 g | INTRAVENOUS | Status: AC
  Administered 2019-06-25: 2 g via INTRAVENOUS
  Filled 2019-06-25: qty 100

## 2019-06-25 MED ORDER — MIDAZOLAM HCL 2 MG/2ML IJ SOLN
INTRAMUSCULAR | Status: AC
  Filled 2019-06-25: qty 2

## 2019-06-25 SURGICAL SUPPLY — 13 items
BAG URO CATCHER STRL LF (MISCELLANEOUS) ×2 IMPLANT
CATH INTERMIT  6FR 70CM (CATHETERS) ×2 IMPLANT
CLOTH BEACON ORANGE TIMEOUT ST (SAFETY) ×2 IMPLANT
GLOVE BIO SURGEON STRL SZ 6 (GLOVE) ×2 IMPLANT
GOWN SPEC L3 MED W/TWL (GOWN DISPOSABLE) ×2 IMPLANT
GOWN STRL REUS W/TWL LRG LVL3 (GOWN DISPOSABLE) ×4 IMPLANT
GUIDEWIRE STR DUAL SENSOR (WIRE) ×2 IMPLANT
KIT TURNOVER KIT A (KITS) IMPLANT
MANIFOLD NEPTUNE II (INSTRUMENTS) ×2 IMPLANT
PACK CYSTO (CUSTOM PROCEDURE TRAY) ×2 IMPLANT
STENT URET 6FRX24 CONTOUR (STENTS) ×1 IMPLANT
TUBING CONNECTING 10 (TUBING) ×2 IMPLANT
TUBING UROLOGY SET (TUBING) IMPLANT

## 2019-06-25 NOTE — H&P (Signed)
CC/HPI: cc: kidney stone   06/25/19: 38 year old woman with a complicated past medical history including hemorrhagic cystitis, urinary retention, status post hysterectomy with anterior/posterior repair who presented to ER on 06/24/19 with right flank pain found to have a 6 mm proximal ureteral calculus. Patient is here with intractable pain today. She also reports she had a recent stroke on 06/15/2019 and was given tPA and stayed in the ICU until 12/24. She is now being treated for complex regional pain syndrome of her right leg. Patient denies any fever or chills. She states that her pain is so severe she is unable to sleep or do anything.     ALLERGIES: Dilaudid - Itching Quinolones    MEDICATIONS: Keflex  Aimovig Autoinjector  Aspir 81 1 PO Daily  Diazepam 5 mg tablet  Doxepin Hcl 75 mg capsule  Effexor Xr 1 PO Daily  Imitrex 50 mg tablet 1 tablet PO Daily  Lamictal  Synthroid 50 mcg tablet 1 tablet PO Daily  Toprol Xl 50 mg tablet, extended release 24 hr 1 tablet PO Daily  Trokendi Xr 50 mg capsule, ext release 24 hr     GU PSH: Cystoscopy - 11/12/2018 Hysterectomy       PSH Notes: TVT  anterior/posterior vaginal repair  salpingectomy   NON-GU PSH: Cholecystectomy (laparoscopic) Pacemaker placement     GU PMH: Urinary Retention - 01/28/2019 Gross hematuria - 12/12/2018, - 11/10/2018, - 01/08/2018 Hemorrhagic cystitis (with hematuria) - 11/12/2018 Urinary Urgency - 11/12/2018 Incomplete bladder emptying - 11/10/2018 Acute Cystitis/UTI - 01/08/2018 Dysuria - 2018 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   Tobacco Use Assessment Completed: Used Tobacco in last 30 days? Social Drinker.  Drinks 2 caffeinated drinks per day. Patient's occupation Scientist, research (life sciences).    REVIEW OF SYSTEMS:    GU Review Female:   Patient reports trouble starting your stream. Patient denies frequent urination, hard to postpone urination, burning /pain with urination, get up at night to urinate, leakage of urine, stream starts and stops, have to strain to urinate, and being pregnant.  Gastrointestinal (Upper):   Patient reports nausea and vomiting. Patient denies indigestion/ heartburn.  Gastrointestinal (Lower):   Patient denies diarrhea and constipation.  Constitutional:   Patient reports fatigue. Patient denies fever, night sweats, and weight loss.  Skin:   Patient denies skin rash/ lesion and itching.  Eyes:   Patient denies blurred vision and double vision.  Ears/ Nose/ Throat:   Patient denies sore throat and sinus problems.  Hematologic/Lymphatic:   Patient denies swollen glands and easy bruising.  Cardiovascular:   Patient denies leg swelling and chest pains.  Respiratory:   Patient denies cough and shortness of breath.  Endocrine:   Patient denies excessive thirst.  Musculoskeletal:   Patient reports back pain. Patient denies joint pain.  Neurological:   Patient reports headaches and dizziness.   Psychologic:   Patient denies anxiety and depression.   Notes: right flank pain     VITAL SIGNS:      06/25/2019 10:22 AM  Weight 202 lb / 91.63 kg  Height 63 in / 160.02 cm  BP 123/87  mmHg  Pulse 99 /min  Temperature 97.7 F / 36.5 C  BMI 35.8 kg/m   GU PHYSICAL EXAMINATION:    Breast: Symmetrical. No tenderness, no nipple discharge, no skin changes. No mass.  Digital Rectal Exam: Normal sphincter tone. No rectal mass.  External Genitalia: No hirsutism, no rash, no scarring, no cyst, no erythematous lesion, no papular lesion, no blanched lesion, no warty lesion. No edema.  Urethral Meatus:  Normal size. Normal position. No discharge.  Urethra: No tenderness, no mass, no scarring. No hypermobility. No leakage.  Bladder: Normal to palpation, no tenderness, no mass, normal size.  Vagina: No atrophy, no stenosis. No rectocele. No cystocele. No enterocele.  Cervix: S/P Hysterectomy  Uterus: S/P Hysterectomy  Adnexa / Parametria: No tenderness. No adnexal mass. Normal left ovary. Normal right ovary.  Anus and Perineum: No hemorrhoids. No anal stenosis. No rectal fissure, no anal fissure. No edema, no dimple, no perineal tenderness, no anal tenderness.   MULTI-SYSTEM PHYSICAL EXAMINATION:    Constitutional: Well-nourished. No physical deformities. Normally developed. Good grooming.  Neck: Neck symmetrical, not swollen. Normal tracheal position.  Respiratory: No labored breathing, no use of accessory muscles.   Cardiovascular: Normal temperature, normal extremity pulses, no swelling, no varicosities.  Lymphatic: No enlargement of neck, axillae, groin.  Skin: No paleness, no jaundice, no cyanosis. No lesion, no ulcer, no rash.  Neurologic / Psychiatric: Oriented to time, oriented to place, oriented to person. No depression, no anxiety, no agitation.  Gastrointestinal: No mass, no tenderness, no rigidity, non obese abdomen.  Eyes: Normal conjunctivae. Normal eyelids.  Ears, Nose, Mouth, and Throat: Left ear no scars, no lesions, no masses. Right ear no scars, no lesions, no masses. Nose no scars, no lesions, no masses. Normal hearing. Normal lips.  Musculoskeletal: Normal gait and station of head and neck.     PAST DATA REVIEWED:  Source Of History:  Patient  X-Ray Review: C.T. Abdomen/Pelvis: Reviewed Films. Discussed With Patient.    Notes:                     06/21/2019: BUN 14, creatinine 0.79   PROCEDURES:          Urinalysis w/Scope Dipstick Dipstick Cont'd Micro  Color: Red Bilirubin: Invalid mg/dL WBC/hpf: 6 - 78/GNF10/hpf  Appearance: Slightly Cloudy Ketones: Invalid mg/dL  RBC/hpf: 40 - 62/ZHY60/hpf  Specific Gravity: Invalid Blood: Invalid ery/uL Bacteria: NS (Not Seen)  pH: Invalid Protein: Invalid mg/dL Cystals: NS (Not Seen)  Glucose: Invalid mg/dL Urobilinogen: Invalid mg/dL Casts: NS (Not Seen)    Nitrites: Invalid Trichomonas: Not Present    Leukocyte Esterase: Invalid leu/uL Mucous: Not Present      Epithelial Cells: NS (Not Seen)      Yeast: NS (Not Seen)      Sperm: Not Present    Notes: INVALID DUE TO COLOR INTERFERENCE. MICROSCOPIC NOT CONCENTRATED.    ASSESSMENT:      ICD-10 Details  1 GU:   Renal and ureteral calculus - N20.2 Stable - I discussed management of the 6 mm ureteral calculus which includes trial passage, staged procedure with stent today versus ureteroscopy schedule at a later date with pain control in interim. Patient states she cannot weight due to pain and so will proceed with a cystoscopy and ureteral stent placement today. She has been NPO since midnight. I discussed the risks and benefits of the procedure with the patient and she would like to proceed. She understands that the stent may cause discomfort,  urinary frequency, hematuria and other symptoms.   PLAN:           Document Letter(s):  Created for Patient: Clinical Summary         Notes:   cc: Marthenia Rolling, MD

## 2019-06-25 NOTE — Anesthesia Postprocedure Evaluation (Signed)
Anesthesia Post Note  Patient: Carmen [name]  Procedure(s) Performed: CYSTOSCOPY WITH RETROGRADE PYELOGRAM, AND RIGHT  STENT PLACEMENT (Right )     Patient location during evaluation: PACU Anesthesia Type: General Level of consciousness: sedated Pain management: pain level controlled Vital Signs Assessment: post-procedure vital signs reviewed and stable Respiratory status: spontaneous breathing and respiratory function stable Cardiovascular status: stable Postop Assessment: no apparent nausea or vomiting Anesthetic complications: no    Last Vitals:  Vitals:   06/25/19 1630 06/25/19 1645  BP: 129/83 127/85  Pulse: 79 82  Resp: 14 17  Temp: 36.9 C 36.8 C  SpO2: 98% 98%    Last Pain:  Vitals:   06/25/19 1645  TempSrc:   PainSc: 0-No pain                 Katelee Schupp DANIEL

## 2019-06-25 NOTE — Interval H&P Note (Signed)
History and Physical Interval Note:  06/25/2019 12:36 PM  Carmen [name]  has presented today for surgery, with the diagnosis of right nephrolithiasis.  The various methods of treatment have been discussed with the patient and family. After consideration of risks, benefits and other options for treatment, the patient has consented to  Procedure(s): CYSTOSCOPY WITH RETROGRADE PYELOGRAM, AND RIGHT  STENT PLACEMENT (Right) as a surgical intervention.  The patient's history has been reviewed, patient examined, no change in status, stable for surgery.  I have reviewed the patient's chart and labs.  Questions were answered to the patient's satisfaction.     Adeyemi Hamad D Lesia Monica

## 2019-06-25 NOTE — Transfer of Care (Signed)
Immediate Anesthesia Transfer of Care Note  Patient: Carmen [name]  Procedure(s) Performed: CYSTOSCOPY WITH RETROGRADE PYELOGRAM, AND RIGHT  STENT PLACEMENT (Right )  Patient Location: PACU  Anesthesia Type:General  Level of Consciousness: awake, oriented and patient cooperative  Airway & Oxygen Therapy: Patient Spontanous Breathing and Patient connected to face mask oxygen  Post-op Assessment: Report given to RN and Post -op Vital signs reviewed and stable  Post vital signs: Reviewed and stable  Last Vitals:  Vitals Value Taken Time  BP 129/80 06/25/19 1545  Temp 36.8 C 06/25/19 1537  Pulse 87 06/25/19 1559  Resp 14 06/25/19 1559  SpO2 100 % 06/25/19 1559  Vitals shown include unvalidated device data.  Last Pain:  Vitals:   06/25/19 1537  TempSrc:   PainSc: Asleep      Patients Stated Pain Goal: 4 (25/63/89 3734)  Complications: No apparent anesthesia complications

## 2019-06-25 NOTE — H&P (View-Only) (Signed)
CC/HPI: cc: kidney stone   06/25/19: 38 year old woman with a complicated past medical history including hemorrhagic cystitis, urinary retention, status post hysterectomy with anterior/posterior repair who presented to ER on 06/24/19 with right flank pain found to have a 6 mm proximal ureteral calculus. Patient is here with intractable pain today. She also reports she had a recent stroke on 06/15/2019 and was given tPA and stayed in the ICU until 12/24. She is now being treated for complex regional pain syndrome of her right leg. Patient denies any fever or chills. She states that her pain is so severe she is unable to sleep or do anything.     ALLERGIES: Dilaudid - Itching Quinolones    MEDICATIONS: Keflex  Aimovig Autoinjector  Aspir 81 1 PO Daily  Diazepam 5 mg tablet  Doxepin Hcl 75 mg capsule  Effexor Xr 1 PO Daily  Imitrex 50 mg tablet 1 tablet PO Daily  Lamictal  Synthroid 50 mcg tablet 1 tablet PO Daily  Toprol Xl 50 mg tablet, extended release 24 hr 1 tablet PO Daily  Trokendi Xr 50 mg capsule, ext release 24 hr     GU PSH: Cystoscopy - 11/12/2018 Hysterectomy       PSH Notes: TVT  anterior/posterior vaginal repair  salpingectomy   NON-GU PSH: Cholecystectomy (laparoscopic) Pacemaker placement     GU PMH: Urinary Retention - 01/28/2019 Gross hematuria - 12/12/2018, - 11/10/2018, - 01/08/2018 Hemorrhagic cystitis (with hematuria) - 11/12/2018 Urinary Urgency - 11/12/2018 Incomplete bladder emptying - 11/10/2018 Acute Cystitis/UTI - 01/08/2018 Dysuria - 2018 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   Tobacco Use Assessment Completed: Used Tobacco in last 30 days? Social Drinker.  Drinks 2 caffeinated drinks per day. Patient's occupation Scientist, research (life sciences).    REVIEW OF SYSTEMS:    GU Review Female:   Patient reports trouble starting your stream. Patient denies frequent urination, hard to postpone urination, burning /pain with urination, get up at night to urinate, leakage of urine, stream starts and stops, have to strain to urinate, and being pregnant.  Gastrointestinal (Upper):   Patient reports nausea and vomiting. Patient denies indigestion/ heartburn.  Gastrointestinal (Lower):   Patient denies diarrhea and constipation.  Constitutional:   Patient reports fatigue. Patient denies fever, night sweats, and weight loss.  Skin:   Patient denies skin rash/ lesion and itching.  Eyes:   Patient denies blurred vision and double vision.  Ears/ Nose/ Throat:   Patient denies sore throat and sinus problems.  Hematologic/Lymphatic:   Patient denies swollen glands and easy bruising.  Cardiovascular:   Patient denies leg swelling and chest pains.  Respiratory:   Patient denies cough and shortness of breath.  Endocrine:   Patient denies excessive thirst.  Musculoskeletal:   Patient reports back pain. Patient denies joint pain.  Neurological:   Patient reports headaches and dizziness.   Psychologic:   Patient denies anxiety and depression.   Notes: right flank pain     VITAL SIGNS:      06/25/2019 10:22 AM  Weight 202 lb / 91.63 kg  Height 63 in / 160.02 cm  BP 123/87  mmHg  Pulse 99 /min  Temperature 97.7 F / 36.5 C  BMI 35.8 kg/m   GU PHYSICAL EXAMINATION:    Breast: Symmetrical. No tenderness, no nipple discharge, no skin changes. No mass.  Digital Rectal Exam: Normal sphincter tone. No rectal mass.  External Genitalia: No hirsutism, no rash, no scarring, no cyst, no erythematous lesion, no papular lesion, no blanched lesion, no warty lesion. No edema.  Urethral Meatus:  Normal size. Normal position. No discharge.  Urethra: No tenderness, no mass, no scarring. No hypermobility. No leakage.  Bladder: Normal to palpation, no tenderness, no mass, normal size.  Vagina: No atrophy, no stenosis. No rectocele. No cystocele. No enterocele.  Cervix: S/P Hysterectomy  Uterus: S/P Hysterectomy  Adnexa / Parametria: No tenderness. No adnexal mass. Normal left ovary. Normal right ovary.  Anus and Perineum: No hemorrhoids. No anal stenosis. No rectal fissure, no anal fissure. No edema, no dimple, no perineal tenderness, no anal tenderness.   MULTI-SYSTEM PHYSICAL EXAMINATION:    Constitutional: Well-nourished. No physical deformities. Normally developed. Good grooming.  Neck: Neck symmetrical, not swollen. Normal tracheal position.  Respiratory: No labored breathing, no use of accessory muscles.   Cardiovascular: Normal temperature, normal extremity pulses, no swelling, no varicosities.  Lymphatic: No enlargement of neck, axillae, groin.  Skin: No paleness, no jaundice, no cyanosis. No lesion, no ulcer, no rash.  Neurologic / Psychiatric: Oriented to time, oriented to place, oriented to person. No depression, no anxiety, no agitation.  Gastrointestinal: No mass, no tenderness, no rigidity, non obese abdomen.  Eyes: Normal conjunctivae. Normal eyelids.  Ears, Nose, Mouth, and Throat: Left ear no scars, no lesions, no masses. Right ear no scars, no lesions, no masses. Nose no scars, no lesions, no masses. Normal hearing. Normal lips.  Musculoskeletal: Normal gait and station of head and neck.     PAST DATA REVIEWED:  Source Of History:  Patient  X-Ray Review: C.T. Abdomen/Pelvis: Reviewed Films. Discussed With Patient.    Notes:                     06/21/2019: BUN 14, creatinine 0.79   PROCEDURES:          Urinalysis w/Scope Dipstick Dipstick Cont'd Micro  Color: Red Bilirubin: Invalid mg/dL WBC/hpf: 6 - 10/hpf  Appearance: Slightly Cloudy Ketones: Invalid mg/dL  RBC/hpf: 40 - 60/hpf  Specific Gravity: Invalid Blood: Invalid ery/uL Bacteria: NS (Not Seen)  pH: Invalid Protein: Invalid mg/dL Cystals: NS (Not Seen)  Glucose: Invalid mg/dL Urobilinogen: Invalid mg/dL Casts: NS (Not Seen)    Nitrites: Invalid Trichomonas: Not Present    Leukocyte Esterase: Invalid leu/uL Mucous: Not Present      Epithelial Cells: NS (Not Seen)      Yeast: NS (Not Seen)      Sperm: Not Present    Notes: INVALID DUE TO COLOR INTERFERENCE. MICROSCOPIC NOT CONCENTRATED.    ASSESSMENT:      ICD-10 Details  1 GU:   Renal and ureteral calculus - N20.2 Stable - I discussed management of the 6 mm ureteral calculus which includes trial passage, staged procedure with stent today versus ureteroscopy schedule at a later date with pain control in interim. Patient states she cannot weight due to pain and so will proceed with a cystoscopy and ureteral stent placement today. She has been NPO since midnight. I discussed the risks and benefits of the procedure with the patient and she would like to proceed. She understands that the stent may cause discomfort,   urinary frequency, hematuria and other symptoms.   PLAN:           Document Letter(s):  Created for Patient: Clinical Summary         Notes:   cc: Marthenia Rolling, MD

## 2019-06-25 NOTE — Op Note (Signed)
PATIENT:  Carmen [name]  Preoperative diagnosis:  1. Right ureteral calculus  Postoperative diagnosis:  1. Right ureteral calculus  Procedure:  1. Cystoscopy 2. right retrograde pyelography with interpretation  3. right ureteral stent placement  (6Fr x 24cm)   Surgeon: Jacalyn Lefevre, M.D.  FINDINGS: 1. Moderate hydronephrosis to distal level of distal ureteral calculus 2. Moderate resistance passing sensor wire past distal stone 3. Normal urethra 4. Normal bladder mucosa on cytoscopy  Anesthesia: General  Complications: None  EBL: Minimal  Specimens: None  Indication: 38 yo woman with 6 mm proximal ureteral calculus with intractable pain.  Description of procedure:  The patient was taken to the operating room and general anesthesia was induced.  The patient was placed in the dorsal lithotomy position, prepped and draped in the usual sterile fashion, and preoperative antibiotics were administered. A preoperative time-out was performed.   Cystourethroscopy was performed.  The patient's urethra was examined and was normal. The bladder was then systematically examined in its entirety. There was no evidence for any bladder tumors, stones, or other mucosal pathology.    Attention then turned to the right ureteral orifice and a ureteral catheter was used to intubate the ureteral orifice.  Full-strength Omnipaque contrast was injected through the ureteral catheter and a retrograde pyelogram was performed.    A 0.038 sensor guidewire was then advanced up the right ureter into the renal pelvis under fluoroscopic guidance.  The wire was then backloaded through the cystoscope and a ureteral stent was advance over the wire using Seldinger technique.  The stent was positioned appropriately under fluoroscopic and cystoscopic guidance.  The wire was then removed with an adequate stent curl noted in the renal pelvis as well as in the bladder.  The bladder was then emptied and the procedure  ended.  The patient appeared to tolerate the procedure well and without complications.  The patient was able to be awakened and transferred to the recovery unit in satisfactory condition.   FOLLOW UP: Patient will need to be schedules for definitive stone surgery in 2-3 weeks.

## 2019-06-25 NOTE — Anesthesia Procedure Notes (Signed)
Procedure Name: LMA Insertion Date/Time: 06/25/2019 3:11 PM Performed by: Silas Sacramento, CRNA Pre-anesthesia Checklist: Patient identified, Emergency Drugs available, Suction available and Patient being monitored Patient Re-evaluated:Patient Re-evaluated prior to induction Oxygen Delivery Method: Circle system utilized Preoxygenation: Pre-oxygenation with 100% oxygen Induction Type: IV induction LMA: LMA inserted LMA Size: 4.0 Tube type: Oral Number of attempts: 1 Placement Confirmation: positive ETCO2 and breath sounds checked- equal and bilateral Tube secured with: Tape Dental Injury: Teeth and Oropharynx as per pre-operative assessment

## 2019-06-25 NOTE — Discharge Instructions (Signed)

## 2019-06-25 NOTE — Anesthesia Preprocedure Evaluation (Addendum)
Anesthesia Evaluation  Patient identified by MRN, date of birth, ID band Patient awake    Reviewed: Allergy & Precautions, NPO status , Patient's Chart, lab work & pertinent test results, reviewed documented beta blocker date and time   Airway Mallampati: II  TM Distance: >3 FB Neck ROM: Full    Dental  (+) Teeth Intact, Dental Advisory Given   Pulmonary neg pulmonary ROS,    Pulmonary exam normal breath sounds clear to auscultation       Cardiovascular hypertension, Pt. on home beta blockers Normal cardiovascular exam+ dysrhythmias Supra Ventricular Tachycardia + pacemaker  Rhythm:Regular Rate:Normal  postural orthostatic tachycardia syndrome  11/13/17 Stress Echo:  Normal left ventricular function and global wall motion with stress. There were no segmental wall motion abnormalities post exercise. There was normal   increase in global LV function post exercise. The estimated LV ejection fraction is 65-70% with stress. Negative exercise echocardiography for inducible ischemia at target heart rate.    Neuro/Psych  Headaches, Seizures -,  PSYCHIATRIC DISORDERS Anxiety Depression Neurocardiogenic syncope     GI/Hepatic Neg liver ROS, GERD  Medicated,  Endo/Other  Hypothyroidism Obesity   Renal/GU negative Renal ROS   Mixed Urinary Incontience    Musculoskeletal negative musculoskeletal ROS (+)   Abdominal   Peds  (+) ATTENTION DEFICIT DISORDER WITHOUT HYPERACTIVITY Hematology negative hematology ROS (+)   Anesthesia Other Findings Day of surgery medications reviewed with the patient.  Reproductive/Obstetrics                             Anesthesia Physical  Anesthesia Plan  ASA: III  Anesthesia Plan: General   Post-op Pain Management:    Induction: Intravenous  PONV Risk Score and Plan: 3 and Ondansetron, Midazolam and Diphenhydramine  Airway Management Planned:  LMA  Additional Equipment:   Intra-op Plan:   Post-operative Plan: Extubation in OR  Informed Consent: I have reviewed the patients History and Physical, chart, labs and discussed the procedure including the risks, benefits and alternatives for the proposed anesthesia with the patient or authorized representative who has indicated his/her understanding and acceptance.     Dental advisory given  Plan Discussed with: CRNA  Anesthesia Plan Comments:        Anesthesia Quick Evaluation

## 2019-06-26 LAB — URINE CULTURE
MICRO NUMBER:: 1241157
Result:: NO GROWTH
SPECIMEN QUALITY:: ADEQUATE

## 2019-07-01 ENCOUNTER — Other Ambulatory Visit: Payer: Self-pay | Admitting: Urology

## 2019-07-03 ENCOUNTER — Encounter (HOSPITAL_BASED_OUTPATIENT_CLINIC_OR_DEPARTMENT_OTHER): Payer: Self-pay | Admitting: Urology

## 2019-07-03 ENCOUNTER — Other Ambulatory Visit: Payer: Self-pay

## 2019-07-03 ENCOUNTER — Other Ambulatory Visit (HOSPITAL_COMMUNITY)
Admission: RE | Admit: 2019-07-03 | Discharge: 2019-07-03 | Disposition: A | Discharge status: Home / Self Care | Payer: Medicaid Other | Source: Ambulatory Visit | Attending: Urology | Admitting: Urology

## 2019-07-03 DIAGNOSIS — Z20822 Contact with and (suspected) exposure to covid-19: Secondary | ICD-10-CM | POA: Diagnosis not present

## 2019-07-03 DIAGNOSIS — Z01812 Encounter for preprocedural laboratory examination: Secondary | ICD-10-CM | POA: Insufficient documentation

## 2019-07-03 NOTE — Progress Notes (Signed)
Spoke w/ via phone for pre-op interview---Carmen [name] needs dos----  I stat 8             [name] results------ COVID test ------07-03-2019 Arrive at -------630 07-07-2019 NPO after ------midnight Medications to take morning of surgery -----trokendi, venlafaxine, fenofibrate, atorvastatin, metorpolol succinate, tamsulosin, levothyroxine, omeprazole Diabetic medication -----n/a Patient Special Instructions ----- Pre-Op special Istructions ----- Patient verbalized understanding of instructions that were given at this phone interview. Patient denies shortness of breath, chest pain, fever, cough a this phone interview.  Anesthesia Review: patient  has pacemaker, stroke December 2020  PCP:dr Marthenia Rolling Cardiologist :Onalee Hua fitzgerald device check note 03-15-2019 [facility],   endoscrinology lov note dr  Urban Gibson 09-18-2018 [facility]  neurologist dr Pearlean Brownie [facility], 2019 [facility] Chest x-ray :none EKG :06-15-2019 chart/epic Echo : 06-16-2019 chart/epic Cardiac Cath : none Sleep Study/ CPAP :mini sleep study done in hospital with [facility] dec 2020 no cpap needed per patient Fasting Blood Sugar :      / Checks Blood Sugar -- times a day:  n/a Blood Thinner/ Instructions /Last Dose:none ASA / Instructions/ Last Dose : 325 mg aspirin last dose 07-21-2019  Patient denies shortness of breath, chest pain, fever, and cough at this phone interview.

## 2019-07-04 LAB — NOVEL CORONAVIRUS, NAA (HOSP ORDER, SEND-OUT TO REF LAB; TAT 18-24 HRS): SARS-CoV-2, NAA: NOT DETECTED

## 2019-07-06 NOTE — Progress Notes (Signed)
Chart reviewed by anesthesia, Jodell Cipro PA,  Ok to proceed.  Received device orders via fax from [facility] device clinic, placed in chart.

## 2019-07-07 ENCOUNTER — Inpatient Hospital Stay (INDEPENDENT_AMBULATORY_CARE_PROVIDER_SITE_OTHER): Payer: PRIVATE HEALTH INSURANCE | Admitting: Primary Care

## 2019-07-07 ENCOUNTER — Encounter (HOSPITAL_BASED_OUTPATIENT_CLINIC_OR_DEPARTMENT_OTHER)
Admission: RE | Disposition: A | Discharge status: Home / Self Care | Payer: Self-pay | Source: Home / Self Care | Attending: Urology

## 2019-07-07 ENCOUNTER — Ambulatory Visit (HOSPITAL_BASED_OUTPATIENT_CLINIC_OR_DEPARTMENT_OTHER): Payer: Medicaid Other | Admitting: Physician Assistant

## 2019-07-07 ENCOUNTER — Ambulatory Visit (HOSPITAL_BASED_OUTPATIENT_CLINIC_OR_DEPARTMENT_OTHER)
Admission: RE | Admit: 2019-07-07 | Discharge: 2019-07-07 | Disposition: A | Discharge status: Home / Self Care | Payer: Medicaid Other | Attending: Urology | Admitting: Urology

## 2019-07-07 ENCOUNTER — Other Ambulatory Visit: Payer: Self-pay

## 2019-07-07 DIAGNOSIS — G4730 Sleep apnea, unspecified: Secondary | ICD-10-CM | POA: Diagnosis not present

## 2019-07-07 DIAGNOSIS — F329 Major depressive disorder, single episode, unspecified: Secondary | ICD-10-CM | POA: Insufficient documentation

## 2019-07-07 DIAGNOSIS — F419 Anxiety disorder, unspecified: Secondary | ICD-10-CM | POA: Insufficient documentation

## 2019-07-07 DIAGNOSIS — I10 Essential (primary) hypertension: Secondary | ICD-10-CM | POA: Diagnosis not present

## 2019-07-07 DIAGNOSIS — Z79890 Hormone replacement therapy (postmenopausal): Secondary | ICD-10-CM | POA: Diagnosis not present

## 2019-07-07 DIAGNOSIS — Z950 Presence of cardiac pacemaker: Secondary | ICD-10-CM | POA: Diagnosis not present

## 2019-07-07 DIAGNOSIS — Z6838 Body mass index (BMI) 38.0-38.9, adult: Secondary | ICD-10-CM | POA: Diagnosis not present

## 2019-07-07 DIAGNOSIS — Z7982 Long term (current) use of aspirin: Secondary | ICD-10-CM | POA: Insufficient documentation

## 2019-07-07 DIAGNOSIS — E039 Hypothyroidism, unspecified: Secondary | ICD-10-CM | POA: Diagnosis not present

## 2019-07-07 DIAGNOSIS — Z8673 Personal history of transient ischemic attack (TIA), and cerebral infarction without residual deficits: Secondary | ICD-10-CM | POA: Diagnosis not present

## 2019-07-07 DIAGNOSIS — Z79899 Other long term (current) drug therapy: Secondary | ICD-10-CM | POA: Diagnosis not present

## 2019-07-07 DIAGNOSIS — N202 Calculus of kidney with calculus of ureter: Secondary | ICD-10-CM | POA: Insufficient documentation

## 2019-07-07 HISTORY — PX: CYSTOSCOPY WITH RETROGRADE PYELOGRAM, URETEROSCOPY AND STENT PLACEMENT: SHX5789

## 2019-07-07 HISTORY — PX: HOLMIUM LASER APPLICATION: SHX5852

## 2019-07-07 HISTORY — DX: Calculus of ureter: N20.1

## 2019-07-07 LAB — POCT I-STAT, CHEM 8
BUN: 13 mg/dL (ref 6–20)
Calcium, Ion: 1.26 mmol/L (ref 1.15–1.40)
Chloride: 111 mmol/L (ref 98–111)
Creatinine, Ser: 0.8 mg/dL (ref 0.44–1.00)
Glucose, Bld: 117 mg/dL — ABNORMAL HIGH (ref 70–99)
HCT: 36 % (ref 36.0–46.0)
Hemoglobin: 12.2 g/dL (ref 12.0–15.0)
Potassium: 4.1 mmol/L (ref 3.5–5.1)
Sodium: 141 mmol/L (ref 135–145)
TCO2: 19 mmol/L — ABNORMAL LOW (ref 22–32)

## 2019-07-07 SURGERY — CYSTOURETEROSCOPY, WITH RETROGRADE PYELOGRAM AND STENT INSERTION
Anesthesia: General | Site: Renal | Laterality: Right

## 2019-07-07 MED ORDER — FENTANYL CITRATE (PF) 100 MCG/2ML IJ SOLN
INTRAMUSCULAR | Status: AC
  Filled 2019-07-07: qty 2

## 2019-07-07 MED ORDER — LACTATED RINGERS IV SOLN
INTRAVENOUS | Status: DC
  Filled 2019-07-07: qty 1000

## 2019-07-07 MED ORDER — FENTANYL CITRATE (PF) 100 MCG/2ML IJ SOLN
INTRAMUSCULAR | Status: DC | PRN
  Administered 2019-07-07: 25 ug via INTRAVENOUS
  Administered 2019-07-07: 50 ug via INTRAVENOUS
  Administered 2019-07-07: 25 ug via INTRAVENOUS

## 2019-07-07 MED ORDER — ONDANSETRON HCL 4 MG/2ML IJ SOLN
INTRAMUSCULAR | Status: DC | PRN
  Administered 2019-07-07: 4 mg via INTRAVENOUS

## 2019-07-07 MED ORDER — KETOROLAC TROMETHAMINE 30 MG/ML IJ SOLN
INTRAMUSCULAR | Status: AC
  Filled 2019-07-07: qty 1

## 2019-07-07 MED ORDER — LIDOCAINE 2% (20 MG/ML) 5 ML SYRINGE
INTRAMUSCULAR | Status: AC
  Filled 2019-07-07: qty 5

## 2019-07-07 MED ORDER — ACETAMINOPHEN 500 MG PO TABS
1000.0000 mg | ORAL_TABLET | Freq: Once | ORAL | Status: AC
  Administered 2019-07-07: 1000 mg via ORAL
  Filled 2019-07-07: qty 2

## 2019-07-07 MED ORDER — PROPOFOL 10 MG/ML IV BOLUS
INTRAVENOUS | Status: AC
  Filled 2019-07-07: qty 40

## 2019-07-07 MED ORDER — IOHEXOL 300 MG/ML  SOLN
INTRAMUSCULAR | Status: DC | PRN
  Administered 2019-07-07: 1 mL

## 2019-07-07 MED ORDER — CEFAZOLIN SODIUM-DEXTROSE 2-4 GM/100ML-% IV SOLN
2.0000 g | INTRAVENOUS | Status: AC
  Administered 2019-07-07: 2 g via INTRAVENOUS
  Filled 2019-07-07: qty 100

## 2019-07-07 MED ORDER — DEXAMETHASONE SODIUM PHOSPHATE 4 MG/ML IJ SOLN
INTRAMUSCULAR | Status: DC | PRN
  Administered 2019-07-07: 5 mg via INTRAVENOUS

## 2019-07-07 MED ORDER — MIDAZOLAM HCL 2 MG/2ML IJ SOLN
INTRAMUSCULAR | Status: AC
  Filled 2019-07-07: qty 2

## 2019-07-07 MED ORDER — FENTANYL CITRATE (PF) 100 MCG/2ML IJ SOLN
25.0000 ug | INTRAMUSCULAR | Status: DC | PRN
  Filled 2019-07-07: qty 1

## 2019-07-07 MED ORDER — ONDANSETRON HCL 4 MG/2ML IJ SOLN
4.0000 mg | Freq: Once | INTRAMUSCULAR | Status: DC | PRN
  Filled 2019-07-07: qty 2

## 2019-07-07 MED ORDER — OXYCODONE HCL 5 MG PO TABS
5.0000 mg | ORAL_TABLET | Freq: Once | ORAL | Status: AC
  Administered 2019-07-07: 11:00:00 5 mg via ORAL
  Filled 2019-07-07: qty 1

## 2019-07-07 MED ORDER — ACETAMINOPHEN 500 MG PO TABS
ORAL_TABLET | ORAL | Status: AC
  Filled 2019-07-07: qty 2

## 2019-07-07 MED ORDER — DEXAMETHASONE SODIUM PHOSPHATE 10 MG/ML IJ SOLN
INTRAMUSCULAR | Status: AC
  Filled 2019-07-07: qty 1

## 2019-07-07 MED ORDER — SODIUM CHLORIDE 0.9 % IR SOLN
Status: DC | PRN
  Administered 2019-07-07: 3000 mL

## 2019-07-07 MED ORDER — LIDOCAINE 2% (20 MG/ML) 5 ML SYRINGE
INTRAMUSCULAR | Status: DC | PRN
  Administered 2019-07-07: 60 mg via INTRAVENOUS

## 2019-07-07 MED ORDER — KETOROLAC TROMETHAMINE 30 MG/ML IJ SOLN
INTRAMUSCULAR | Status: DC | PRN
  Administered 2019-07-07: 30 mg via INTRAVENOUS

## 2019-07-07 MED ORDER — CEFAZOLIN SODIUM-DEXTROSE 2-4 GM/100ML-% IV SOLN
INTRAVENOUS | Status: AC
  Filled 2019-07-07: qty 100

## 2019-07-07 MED ORDER — OXYCODONE HCL 5 MG PO TABS
ORAL_TABLET | ORAL | Status: AC
  Filled 2019-07-07: qty 1

## 2019-07-07 MED ORDER — CEPHALEXIN 500 MG PO CAPS: 500.0000 mg | ORAL_CAPSULE | Freq: Four times a day (QID) | ORAL | 0 refills | Status: AC

## 2019-07-07 MED ORDER — PROPOFOL 10 MG/ML IV BOLUS
INTRAVENOUS | Status: DC | PRN
  Administered 2019-07-07: 200 mg via INTRAVENOUS

## 2019-07-07 MED ORDER — MIDAZOLAM HCL 5 MG/5ML IJ SOLN
INTRAMUSCULAR | Status: DC | PRN
  Administered 2019-07-07: 2 mg via INTRAVENOUS

## 2019-07-07 MED ORDER — ONDANSETRON HCL 4 MG/2ML IJ SOLN
INTRAMUSCULAR | Status: AC
  Filled 2019-07-07: qty 2

## 2019-07-07 SURGICAL SUPPLY — 21 items
BAG DRAIN URO-CYSTO SKYTR STRL (DRAIN) ×2 IMPLANT
BAG DRN UROCATH (DRAIN) ×1
BASKET ZERO TIP NITINOL 2.4FR (BASKET) ×1 IMPLANT
BSKT STON RTRVL ZERO TP 2.4FR (BASKET) ×1
CATH URET 5FR 28IN OPEN ENDED (CATHETERS) ×2 IMPLANT
CLOTH BEACON ORANGE TIMEOUT ST (SAFETY) ×2 IMPLANT
DRSG TEGADERM 4X4.75 (GAUZE/BANDAGES/DRESSINGS) IMPLANT
EXTRACTOR STONE 1.7FRX115CM (UROLOGICAL SUPPLIES) IMPLANT
FIBER LASER FLEXIVA 200 (UROLOGICAL SUPPLIES) ×1 IMPLANT
GLOVE BIO SURGEON STRL SZ 6.5 (GLOVE) ×2 IMPLANT
GLOVE BIOGEL PI IND STRL 8 (GLOVE) IMPLANT
GLOVE BIOGEL PI INDICATOR 8 (GLOVE) ×3
GOWN STRL REUS W/TWL LRG LVL3 (GOWN DISPOSABLE) ×3 IMPLANT
GUIDEWIRE ANG ZIPWIRE 038X150 (WIRE) IMPLANT
GUIDEWIRE STR DUAL SENSOR (WIRE) ×2 IMPLANT
KIT TURNOVER CYSTO (KITS) IMPLANT
MANIFOLD NEPTUNE II (INSTRUMENTS) ×2 IMPLANT
PACK CYSTO (CUSTOM PROCEDURE TRAY) ×2 IMPLANT
STENT URET 6FRX24 CONTOUR (STENTS) ×1 IMPLANT
TUBE CONNECTING 12X1/4 (SUCTIONS) ×2 IMPLANT
TUBING UROLOGY SET (TUBING) ×2 IMPLANT

## 2019-07-07 NOTE — Anesthesia Preprocedure Evaluation (Addendum)
Anesthesia Evaluation  Patient identified by MRN, date of birth, ID band Patient awake    Reviewed: Allergy & Precautions, NPO status , Patient's Chart, lab work & pertinent test results  Airway Mallampati: II  TM Distance: >3 FB Neck ROM: Full    Dental  (+) Dental Advisory Given   Pulmonary sleep apnea ,    breath sounds clear to auscultation       Cardiovascular hypertension, Pt. on medications and Pt. on home beta blockers + pacemaker  Rhythm:Regular Rate:Normal     Neuro/Psych CVA    GI/Hepatic Neg liver ROS, GERD  ,  Endo/Other  Hypothyroidism Morbid obesity  Renal/GU Renal disease     Musculoskeletal   Abdominal   Peds  Hematology negative hematology ROS (+)   Anesthesia Other Findings   Reproductive/Obstetrics                            Anesthesia Physical Anesthesia Plan  ASA: III  Anesthesia Plan: General   Post-op Pain Management:    Induction: Intravenous  PONV Risk Score and Plan: 3 and Dexamethasone, Ondansetron and Treatment may vary due to age or medical condition  Airway Management Planned: LMA  Additional Equipment:   Intra-op Plan:   Post-operative Plan: Extubation in OR  Informed Consent: I have reviewed the patients History and Physical, chart, labs and discussed the procedure including the risks, benefits and alternatives for the proposed anesthesia with the patient or authorized representative who has indicated his/her understanding and acceptance.     Dental advisory given  Plan Discussed with: CRNA  Anesthesia Plan Comments:         Anesthesia Quick Evaluation

## 2019-07-07 NOTE — Op Note (Signed)
PATIENT:  Rosangelica A Peche  PRE-OPERATIVE DIAGNOSIS:  right Ureteral calculus  POST-OPERATIVE DIAGNOSIS: Same  PROCEDURE:  1. Cystoscopy, right semi-rigid ureteroscopy with laser lithotripsy and stent exchange  SURGEON: Kasandra Knudsen, MD  INDICATION: 39 year old woman with a 6 mm distal right ureteral calculus status post stent placement 06/24/2020 for intractable pain returns for ureteroscopy and removal of stone.  ANESTHESIA:  General  EBL:  Minimal  DRAINS: 6Fr x 24cm right JJ ureteral stent  SPECIMEN:  none  DESCRIPTION OF PROCEDURE: The patient was taken to the major OR and placed on the table. General anesthesia was administered and then the patient was moved to the dorsal lithotomy position. The genitalia was sterilely prepped and draped. An official timeout was performed.  Initially the 23 French cystoscope with 30 lens was passed under direct vision into the bladder. The bladder was then fully inspected. It was noted be free of any tumors, stones or inflammatory lesions. Ureteral orifices were of normal configuration and position.  The existing ureteral stent was brought to the urethral meatus and a 0.038 inch floppy-tipped guidewire through the stent and into the area of the renal pelvis and this was left in place.  Semi-rigid ureteroscopy then took place and the stone was encountered in the distal ureter.  The 200  holmium laser fiber was used to fragment the stone. I then used the nitinol basket to extract all of the stone fragments and reinspection of the ureter ureteroscopically revealed no further stone fragments and no injury to the ureter. I then backloaded the cystoscope over the guidewire and passed the stent over the guidewire into the area of the renal pelvis. As the guidewire was removed good curl was noted in the renal pelvis. The bladder was drained and the cystoscope was then removed. The patient tolerated the procedure well no intraoperative complications.  PLAN  OF CARE: Discharge to home after PACU  PATIENT DISPOSITION:  PACU - hemodynamically stable.

## 2019-07-07 NOTE — Discharge Instructions (Signed)
Post stent placement instructions   Definitions:  Ureter: The duct that transports urine from the kidney to the bladder. Stent: A plastic hollow tube that is placed into the ureter, from the kidney to the bladder to prevent the ureter from swelling shut.  General instructions:  Despite the fact that no skin incisions were used, the area around the ureter and bladder is raw and irritated. The stent is a foreign body which can further irritate the bladder wall. This irritation is manifested by increased frequency of urination, both day and night, and by an increase in the urge to urinate. In some, the urge to urinate is present almost always. Sometimes the urge is strong enough that you may not be able to stop your self from urinating. This can often be controlled with medication but does not occur in everyone. A stent can safely be left in place for 3 months or greater.  You may see some blood in your urine while the stent is in place and a few days afterward. Do not be alarmed, even if the urine is clear for a while. Get off your feet and drink lots of fluids until clearing occurs. If you start to pass clots or don't improve, call us.  Diet:  You may return to your normal diet immediately. Because of the raw surface of your bladder, alcohol, spicy foods, foods high in acid and drinks with caffeine may cause irritation or frequency and should be used in moderation. To keep your urine flowing freely and avoid constipation, drink plenty of fluids during the day (8-10 glasses). Tip: Avoid cranberry juice because it is very acidic.  Activity:  Your physical activity doesn't need to be restricted. However, if you are very active, you may see some blood in the urine. We suggest that you reduce your activity under the circumstances until the bleeding has stopped.  Bowels:  It is important to keep your bowels regular during the postoperative period. Straining with bowel movements can cause bleeding. A  bowel movement every other day is reasonable. Use a mild laxative if needed, such as milk of magnesia 2-3 tablespoons, or 2 Dulcolax tablets. Call if you continue to have problems. If you had been taking narcotics for pain, before, during or after your surgery, you may be constipated. Take a laxative if necessary.  Medication:  You should resume your pre-surgery medications unless told not to. In addition you may be given an antibiotic to prevent or treat infection. Antibiotics are not always necessary. All medication should be taken as prescribed until the bottles are finished unless you are having an unusual reaction to one of the drugs.  Problems you should report to [place]:  a. Fever greater than 101F. b. Heavy bleeding, or clots (see notes above about blood in urine). c. Inability to urinate. d. Drug reactions (hives, rash, nausea, vomiting, diarrhea). e. Severe burning or pain with urination that is not improving.  Stent Removal You may remove the stent on Friday, January 15th.  Remove the bandage on your leg and pull the string until the entire stent has been removed.  The stent can be discarded.  Please take 1 tab of keflex prior to removing your stent to help prevent UTI.   Post Anesthesia Home Care Instructions  Activity: Get plenty of rest for the remainder of the day. A responsible adult should stay with you for 24 hours following the procedure.  For the next 24 hours, DO NOT: -Drive a car Film/video editor -Drink  alcoholic beverages -Take any medication unless instructed by your physician -Make any legal decisions or sign important papers.  Meals: Start with liquid foods such as gelatin or soup. Progress to regular foods as tolerated. Avoid greasy, spicy, heavy foods. If nausea and/or vomiting occur, drink only clear liquids until the nausea and/or vomiting subsides. Call your physician if vomiting continues.  Special Instructions/Symptoms: Your throat may feel dry or sore  from the anesthesia or the breathing tube placed in your throat during surgery. If this causes discomfort, gargle with warm salt water. The discomfort should disappear within 24 hours.  If you had a scopolamine patch placed behind your ear for the management of post- operative nausea and/or vomiting:  1. The medication in the patch is effective for 72 hours, after which it should be removed.  Wrap patch in a tissue and discard in the trash. Wash hands thoroughly with soap and water. 2. You may remove the patch earlier than 72 hours if you experience unpleasant side effects which may include dry mouth, dizziness or visual disturbances. 3. Avoid touching the patch. Wash your hands with soap and water after contact with the patch.

## 2019-07-07 NOTE — Transfer of Care (Signed)
Immediate Anesthesia Transfer of Care Note  Patient: Carmen [name]  Procedure(s) Performed: Procedure(s) (LRB): CYSTOSCOPY , URETEROSCOPY AND STENT REPLACEMENT (Right) HOLMIUM LASER APPLICATION (Right)  Patient Location: PACU  Anesthesia Type: General  Level of Consciousness: awake, sedated, patient cooperative and responds to stimulation  Airway & Oxygen Therapy: Patient Spontanous Breathing and Patient connected to NC02 and soft FM   Post-op Assessment: Report given to PACU RN, Post -op Vital signs reviewed and stable and Patient moving all extremities  Post vital signs: Reviewed and stable  Complications: No apparent anesthesia complications

## 2019-07-07 NOTE — Interval H&P Note (Signed)
History and Physical Interval Note: Patient underwent cystoscopy with right ureteral stent placement on 06/24/20. She returns today for ureteroscopy, laser lithotripsy and stent exchange.   07/07/2019 7:32 AM  Carmen [name]  has presented today for surgery, with the diagnosis of RIGHT URETERAL CALCULUS.  The various methods of treatment have been discussed with the patient and family. After consideration of risks, benefits and other options for treatment, the patient has consented to  Procedure(s): CYSTOSCOPY WITH RETROGRADE PYELOGRAM, URETEROSCOPY AND STENT PLACEMENT (Right) HOLMIUM LASER APPLICATION (Right) as a surgical intervention.  The patient's history has been reviewed, patient examined, no change in status, stable for surgery.  I have reviewed the patient's chart and labs.  Questions were answered to the patient's satisfaction.     Percival Glasheen D Allexus Ovens

## 2019-07-07 NOTE — Anesthesia Postprocedure Evaluation (Signed)
Anesthesia Post Note  Patient: Mercer A Peche  Procedure(s) Performed: CYSTOSCOPY , URETEROSCOPY AND STENT REPLACEMENT (Right Renal) HOLMIUM LASER APPLICATION (Right Renal)     Patient location during evaluation: PACU Anesthesia Type: General Level of consciousness: awake and alert Pain management: pain level controlled Vital Signs Assessment: post-procedure vital signs reviewed and stable Respiratory status: spontaneous breathing, nonlabored ventilation, respiratory function stable and patient connected to nasal cannula oxygen Cardiovascular status: blood pressure returned to baseline and stable Postop Assessment: no apparent nausea or vomiting Anesthetic complications: no    Last Vitals:  Vitals:   07/07/19 0930 07/07/19 0945  BP: (!) 132/96 122/85  Pulse: 79 77  Resp: 13 17  Temp:    SpO2: 100% 100%    Last Pain:  Vitals:   07/07/19 1035  TempSrc:   PainSc: 4                  Kennieth Rad

## 2019-07-07 NOTE — Progress Notes (Signed)
Dr. Arita Miss in to see. Attempted stent replacement with foley cath. When pt stood to dress, steady flow of urine. Dr. Arita Miss called and made aware. Ok to remove stent before discharge home. Stent removed without difficulty, no further urine leakage. Pt dressed and discharged.

## 2019-07-07 NOTE — Anesthesia Procedure Notes (Signed)
Procedure Name: LMA Insertion Date/Time: 07/07/2019 8:43 AM Performed by: Jessica Priest, CRNA Pre-anesthesia Checklist: Patient identified, Emergency Drugs available, Suction available and Patient being monitored Patient Re-evaluated:Patient Re-evaluated prior to induction Oxygen Delivery Method: Circle system utilized Preoxygenation: Pre-oxygenation with 100% oxygen Induction Type: IV induction Ventilation: Mask ventilation without difficulty LMA: LMA inserted LMA Size: 4.0 Number of attempts: 1 Airway Equipment and Method: Bite block Placement Confirmation: positive ETCO2 and breath sounds checked- equal and bilateral Tube secured with: Tape Dental Injury: Teeth and Oropharynx as per pre-operative assessment

## 2019-09-08 ENCOUNTER — Other Ambulatory Visit: Payer: Self-pay

## 2019-09-08 NOTE — Patient Outreach (Signed)
First telephone outreach attempt to obtain mRS. No answer. Left message for returned call.  Zach Tietje [facility] Management Assistant [phone] 

## 2019-09-16 ENCOUNTER — Other Ambulatory Visit: Payer: Self-pay

## 2019-09-16 NOTE — Patient Outreach (Signed)
Second telephone outreach attempt to obtain mRS. No answer. Left message for returned call.  Zuleyma Scharf [facility] Management Assistant [phone] 

## 2019-09-22 NOTE — Patient Outreach (Signed)
[facility]) Care Management  09/22/2019  LENNIX ROTUNDO 04-05-1981 [phone]   Third outreach attempt to obtain mRS not needed. Per Complex Stroke Patient follow up, patient had no stroke.  Baruch Gouty [facility] Management Assistant [phone]

## 2019-10-19 ENCOUNTER — Emergency Department (HOSPITAL_COMMUNITY)
Admission: EM | Admit: 2019-10-19 | Discharge: 2019-10-19 | Disposition: A | Discharge status: Home / Self Care | Payer: Medicaid Other | Attending: Emergency Medicine | Admitting: Emergency Medicine

## 2019-10-19 ENCOUNTER — Emergency Department (HOSPITAL_COMMUNITY): Payer: Medicaid Other

## 2019-10-19 ENCOUNTER — Other Ambulatory Visit: Payer: Self-pay

## 2019-10-19 ENCOUNTER — Encounter (HOSPITAL_COMMUNITY): Payer: Self-pay | Admitting: Emergency Medicine

## 2019-10-19 DIAGNOSIS — R519 Headache, unspecified: Secondary | ICD-10-CM | POA: Diagnosis present

## 2019-10-19 DIAGNOSIS — Z8673 Personal history of transient ischemic attack (TIA), and cerebral infarction without residual deficits: Secondary | ICD-10-CM | POA: Insufficient documentation

## 2019-10-19 DIAGNOSIS — G43109 Migraine with aura, not intractable, without status migrainosus: Secondary | ICD-10-CM | POA: Diagnosis not present

## 2019-10-19 DIAGNOSIS — Z7982 Long term (current) use of aspirin: Secondary | ICD-10-CM | POA: Diagnosis not present

## 2019-10-19 DIAGNOSIS — E039 Hypothyroidism, unspecified: Secondary | ICD-10-CM | POA: Diagnosis not present

## 2019-10-19 DIAGNOSIS — Z950 Presence of cardiac pacemaker: Secondary | ICD-10-CM | POA: Insufficient documentation

## 2019-10-19 DIAGNOSIS — Z79899 Other long term (current) drug therapy: Secondary | ICD-10-CM | POA: Diagnosis not present

## 2019-10-19 LAB — CBC
HCT: 41.9 % (ref 36.0–46.0)
Hemoglobin: 14.1 g/dL (ref 12.0–15.0)
MCH: 31.3 pg (ref 26.0–34.0)
MCHC: 33.7 g/dL (ref 30.0–36.0)
MCV: 92.9 fL (ref 80.0–100.0)
Platelets: 256 10*3/uL (ref 150–400)
RBC: 4.51 MIL/uL (ref 3.87–5.11)
RDW: 12.3 % (ref 11.5–15.5)
WBC: 7.2 10*3/uL (ref 4.0–10.5)
nRBC: 0 % (ref 0.0–0.2)

## 2019-10-19 LAB — COMPREHENSIVE METABOLIC PANEL
ALT: 22 U/L (ref 0–44)
AST: 26 U/L (ref 15–41)
Albumin: 4 g/dL (ref 3.5–5.0)
Alkaline Phosphatase: 68 U/L (ref 38–126)
Anion gap: 10 (ref 5–15)
BUN: 18 mg/dL (ref 6–20)
CO2: 21 mmol/L — ABNORMAL LOW (ref 22–32)
Calcium: 9.4 mg/dL (ref 8.9–10.3)
Chloride: 109 mmol/L (ref 98–111)
Creatinine, Ser: 0.79 mg/dL (ref 0.44–1.00)
GFR calc Af Amer: >60 mL/min (ref >60)
GFR calc non Af Amer: >60 mL/min (ref >60)
Glucose, Bld: 93 mg/dL (ref 70–99)
Potassium: 4.1 mmol/L (ref 3.5–5.1)
Sodium: 140 mmol/L (ref 135–145)
Total Bilirubin: 0.7 mg/dL (ref 0.3–1.2)
Total Protein: 6.9 g/dL (ref 6.5–8.1)

## 2019-10-19 LAB — PROTIME-INR
INR: 1 (ref 0.8–1.2)
Prothrombin Time: 12.9 seconds (ref 11.4–15.2)

## 2019-10-19 LAB — DIFFERENTIAL
Abs Immature Granulocytes: 0.03 10*3/uL (ref 0.00–0.07)
Basophils Absolute: 0.1 10*3/uL (ref 0.0–0.1)
Basophils Relative: 1 %
Eosinophils Absolute: 0.1 10*3/uL (ref 0.0–0.5)
Eosinophils Relative: 2 %
Immature Granulocytes: 0 %
Lymphocytes Relative: 28 %
Lymphs Abs: 2 10*3/uL (ref 0.7–4.0)
Monocytes Absolute: 0.5 10*3/uL (ref 0.1–1.0)
Monocytes Relative: 6 %
Neutro Abs: 4.5 10*3/uL (ref 1.7–7.7)
Neutrophils Relative %: 63 %

## 2019-10-19 LAB — I-STAT CHEM 8, ED
BUN: 21 mg/dL — ABNORMAL HIGH (ref 6–20)
Calcium, Ion: 1.23 mmol/L (ref 1.15–1.40)
Chloride: 107 mmol/L (ref 98–111)
Creatinine, Ser: 0.7 mg/dL (ref 0.44–1.00)
Glucose, Bld: 89 mg/dL (ref 70–99)
HCT: 41 % (ref 36.0–46.0)
Hemoglobin: 13.9 g/dL (ref 12.0–15.0)
Potassium: 4.1 mmol/L (ref 3.5–5.1)
Sodium: 140 mmol/L (ref 135–145)
TCO2: 24 mmol/L (ref 22–32)

## 2019-10-19 LAB — CBG MONITORING, ED: Glucose-Capillary: 81 mg/dL (ref 70–99)

## 2019-10-19 LAB — APTT: aPTT: 29 seconds (ref 24–36)

## 2019-10-19 LAB — I-STAT BETA HCG BLOOD, ED (NOT ORDERABLE): I-stat hCG, quantitative: <5 m[IU]/mL (ref <5)

## 2019-10-19 LAB — ETHANOL: Alcohol, Ethyl (B): <10 mg/dL (ref <10)

## 2019-10-19 MED ORDER — KETOROLAC TROMETHAMINE 30 MG/ML IJ SOLN
15.0000 mg | Freq: Once | INTRAMUSCULAR | Status: AC
  Administered 2019-10-19: 15 mg via INTRAVENOUS
  Filled 2019-10-19: qty 1

## 2019-10-19 MED ORDER — METOCLOPRAMIDE HCL 5 MG/ML IJ SOLN
10.0000 mg | Freq: Once | INTRAMUSCULAR | Status: AC
  Administered 2019-10-19: 14:00:00 10 mg via INTRAVENOUS
  Filled 2019-10-19: qty 2

## 2019-10-19 MED ORDER — DIPHENHYDRAMINE HCL 50 MG/ML IJ SOLN
12.5000 mg | Freq: Once | INTRAMUSCULAR | Status: AC
  Administered 2019-10-19: 12.5 mg via INTRAVENOUS
  Filled 2019-10-19: qty 1

## 2019-10-19 NOTE — Consult Note (Addendum)
Referring Physician: ER, EMS    Reason for Consult: Code Stroke  HPI: Carmen [name] is an 39 y.o. female who was just evaluated in December for stroke, MRI neg after IV tPA, anxiety, depression, GERD, migraines. She reports having a h/a over the weekend. Today while at work she became nauseous and passed out. When she awakened, she had some subjective right side weakness. She was brought to ER as Code stroke. She had a stat CTH done with was neg, but since symptoms were rapidly improving and further h/o of complicated migraine came to light code stroke was stopped.   Date last known well: 4/26 Time last known well: 49  tPA Given: no, rapid improving and other diagnosis  Past Medical History Past Medical History:  Diagnosis Date   Anxiety    Costochondritis 09/13/2010   Depression    GERD (gastroesophageal reflux disease)    Headache    migrains   Heart palpitations    long history of    History of chest pain    History of cholecystitis    Laparoscopic cholecystectomy   History of fatigue    History of kidney stones    History of migraine headaches    History of seizure disorder    remote history of seizure activity and was evaluated at [facility] for this in the past  She is presently not on any seizure  medications and has not had any seizures for many years.   History of tilt table evaluation    IMPRESSION: Positive tilt table study with significant cardioinhibitory  response.   Hyperlipidemia    Hypertension    Hypothyroidism    Kidney stones    history of   Neurocardiogenic syncope    Pericarditis 09/13/2010   Presence of permanent cardiac pacemaker    PSVT (paroxysmal supraventricular tachycardia) ([place])    Right ureteral calculus    Sleep apnea    osa no cpap needed   Sleeping difficulties    Stress    Stroke ([place]) 06/15/2019   no residual deficits, tpa given at time of stroke   SVD (spontaneous vaginal delivery)    x 1   Syncope and collapse     neurocardiogenic    Surgical History Past Surgical History:  Procedure Laterality Date   ANTERIOR AND POSTERIOR REPAIR N/A 11/14/2017   Procedure: ANTERIOR (CYSTOCELE) AND POSTERIOR REPAIR (RECTOCELE);  Surgeon: Delsa Bern, MD;  Location: [place] [facility];  Service: Gynecology;  Laterality: N/A;   BILATERAL SALPINGECTOMY Bilateral 11/14/2017   Procedure: BILATERAL SALPINGECTOMY;  Surgeon: Delsa Bern, MD;  Location: [place] [facility];  Service: Gynecology;  Laterality: Bilateral;   BLADDER SUSPENSION Bilateral 11/14/2017   Procedure: TRANSVAGINAL TAPE (TVT) PROCEDURE;  Surgeon: Everett Graff, MD;  Location: [place] [facility];  Service: Gynecology;  Laterality: Bilateral;   CHOLECYSTECTOMY  11/2007   Cholecystitis - Laparoscopic cholecystectomy with intraoperative  cholangiogram.   CHOLECYSTECTOMY     CYSTOSCOPY N/A 11/14/2017   Procedure: CYSTOSCOPY;  Surgeon: Everett Graff, MD;  Location: [facility];  Service: Gynecology;  Laterality: N/A;   CYSTOSCOPY WITH RETROGRADE PYELOGRAM, URETEROSCOPY AND STENT PLACEMENT Right 06/25/2019   Procedure: CYSTOSCOPY WITH RETROGRADE PYELOGRAM, AND RIGHT  STENT PLACEMENT;  Surgeon: Robley Fries, MD;  Location: [facility];  Service: Urology;  Laterality: Right;   CYSTOSCOPY WITH RETROGRADE PYELOGRAM, URETEROSCOPY AND STENT PLACEMENT Right 07/07/2019   Procedure: CYSTOSCOPY , URETEROSCOPY AND STENT REPLACEMENT;  Surgeon: Robley Fries, MD;  Location: [facility];  Service: Urology;  Laterality: Right;   HOLMIUM LASER APPLICATION Right 07/07/2019   Procedure: HOLMIUM LASER APPLICATION;  Surgeon: Noel Christmas, MD;  Location: [facility];  Service: Urology;  Laterality: Right;   PACEMAKER INSERTION  2016   Tilt Table Study  07/17/2005   Positive tilt table study with significant cardioinhibitory  response.   VAGINAL HYSTERECTOMY N/A 11/14/2017   Procedure: HYSTERECTOMY VAGINAL;  Surgeon: Silverio Lay, MD;  Location: [facility];  Service: Gynecology;   Laterality: N/A;   WISDOM TOOTH EXTRACTION      Family History  Family History  Problem Relation Age of Onset   Hypertension Mother    Heart disease Mother    Cancer Mother        breast   Heart attack Father    Hyperlipidemia Father    Hepatitis C Father    Cancer Maternal Aunt        Breast    Social History:   reports that she has never smoked. She has never used smokeless tobacco. She reports current alcohol use. She reports that she does not use drugs.  Allergies:  Allergies  Allergen Reactions   Tape Hives    Adhesive tape - tegaderm/paper tape ok   Dilaudid [Hydromorphone Hcl] Itching    "I just about crawled out of my skin"   Prochlorperazine Other (See Comments)    Akathisia with compazine in ED on 05/17/19.  Resolved with repeat dose of benadryl.      Quinolones Nausea Only    Home Medications:  (Not in a hospital admission)   Hospital Medications   ROS:  History obtained from pt  General ROS: negative for - chills, fatigue, fever, night sweats, weight gain or weight loss Psychological ROS: negative for - behavioral disorder, hallucinations, memory difficulties, mood swings or suicidal ideation Ophthalmic ROS: negative for - blurry vision, double vision, eye pain or loss of vision ENT ROS: negative for - epistaxis, nasal discharge, oral lesions, sore throat, tinnitus or vertigo Allergy and Immunology ROS: negative for - hives or itchy/watery eyes Hematological and Lymphatic ROS: negative for - bleeding problems, bruising or swollen lymph nodes Endocrine ROS: negative for - galactorrhea, hair pattern changes, polydipsia/polyuria or temperature intolerance Respiratory ROS: negative for - cough, hemoptysis, shortness of breath or wheezing Cardiovascular ROS: negative for - chest pain, dyspnea on exertion, edema or irregular heartbeat Gastrointestinal ROS: negative for - abdominal pain, diarrhea, hematemesis, nausea/vomiting or stool  incontinence Genito-Urinary ROS: negative for - dysuria, hematuria, incontinence or urinary frequency/urgency Musculoskeletal ROS: negative for - joint swelling or muscular weakness Neurological ROS: as noted in HPI, H/a Dermatological ROS: negative for rash and skin lesion changes   Physical Examination:  Vitals:   10/19/19 1400 10/19/19 1430 10/19/19 1500 10/19/19 1512  BP: 127/82 128/83    Pulse: 69 70 76 71  Resp: 12 10 (!) 24 16  Temp:    98.7 F (37.1 C)  TempSrc:    Oral  SpO2: 97% 97% 97% 98%   HENT: Atraumatic.  Cardiovascular: Rate and Rhythm: Regular rhythm.  Pulmonary: Breath sounds: No stridor. No wheezing.  Abdominal: Tenderness: There is no abdominal tenderness.  Musculoskeletal:    General: No tenderness. Cervical back: Neck supple.  Skin: General: Skin is warm. Capillary Refill: Capillary refill takes less than 2 seconds.  Neurological:     Mental Status: She is alert and oriented to person, place, and time.     PERRL, EOMI. Face is heald is add right back position until  asked to smile, then normalizes No drift on upper extremities.  Potentially mild weakness right upper extremity but not consistent, highly variable and improving.  Finger-nose intact bilaterally.   NIHSS 1a Level of Conscious.: 0 1b LOC Questions: 0 1c LOC Commands: 0 2 Best Gaze: 0 3 Visual: 0 4 Facial Palsy: 0 5a Motor Arm - Left:0 5b Motor Arm - Right: 0 6a Motor Leg - Left: 0 6b Motor Leg - Right: 0 7 Limb Ataxia: 0 8 Sensory: 0 9 Best Language: 0 10 Dysarthria: 0 11 Extinct. and Inatten.: 0 TOTAL: 0    LABORATORY STUDIES:  Basic Metabolic Panel: Recent Labs  Lab 10/19/19 1234 10/19/19 1242  NA 140 140  K 4.1 4.1  CL 109 107  CO2 21*  --   GLUCOSE 93 89  BUN 18 21*  CREATININE 0.79 0.70  CALCIUM 9.4  --     Liver Function Tests: Recent Labs  Lab 10/19/19 1234  AST 26  ALT 22  ALKPHOS 68  BILITOT 0.7  PROT 6.9  ALBUMIN 4.0   No results for input(s):  LIPASE, AMYLASE in the last 168 hours. No results for input(s): AMMONIA in the last 168 hours.  CBC: Recent Labs  Lab 10/19/19 1234 10/19/19 1242  WBC 7.2  --   NEUTROABS 4.5  --   HGB 14.1 13.9  HCT 41.9 41.0  MCV 92.9  --   PLT 256  --     Cardiac Enzymes: No results for input(s): CKTOTAL, CKMB, CKMBINDEX, TROPONINI in the last 168 hours.  BNP: Invalid input(s): POCBNP  CBG: Recent Labs  Lab 10/19/19 1234  GLUCAP 81    Microbiology:   Coagulation Studies: Recent Labs    10/19/19 1234  LABPROT 12.9  INR 1.0    Urinalysis: No results for input(s): COLORURINE, LABSPEC, PHURINE, GLUCOSEU, HGBUR, BILIRUBINUR, KETONESUR, PROTEINUR, UROBILINOGEN, NITRITE, LEUKOCYTESUR in the last 168 hours.  Invalid input(s): APPERANCEUR  Lipid Panel:     Component Value Date/Time   CHOL 231 (H) 06/16/2019 0616   TRIG 490 (H) 06/16/2019 0616   HDL 30 (L) 06/16/2019 0616   CHOLHDL 7.7 06/16/2019 0616   VLDL UNABLE TO CALCULATE IF TRIGLYCERIDE OVER 400 mg/dL 97/07/6376 5885   LDLCALC UNABLE TO CALCULATE IF TRIGLYCERIDE OVER 400 mg/dL 02/77/4128 7867    EHMC9O:  Lab Results  Component Value Date   HGBA1C 5.1 06/16/2019    Urine Drug Screen:  No results found for: LABOPIA, COCAINSCRNUR, LABBENZ, AMPHETMU, THCU, LABBARB   Alcohol Level:  Recent Labs  Lab 10/19/19 1234  ETH <10    Miscellaneous labs:  EKG EKG   IMAGING: CT HEAD CODE STROKE WO CONTRAST  Result Date: 10/19/2019 CLINICAL DATA:  Code stroke. Ataxia. Rule out stroke. Right-sided weakness. EXAM: CT HEAD WITHOUT CONTRAST TECHNIQUE: Contiguous axial images were obtained from the base of the skull through the vertex without intravenous contrast. COMPARISON:  CT head 06/16/2019 FINDINGS: Brain: No evidence of acute infarction, hemorrhage, hydrocephalus, extra-axial collection or mass lesion/mass effect. Vascular: Negative for hyperdense vessel Skull: Negative Sinuses/Orbits: Negative Other: None ASPECTS  (Alberta Stroke Program Early CT Score) - Ganglionic level infarction (caudate, lentiform nuclei, internal capsule, insula, M1-M3 cortex): 7 - Supraganglionic infarction (M4-M6 cortex): 3 Total score (0-10 with 10 being normal): 10 IMPRESSION: 1. Negative CT head 2. ASPECTS is 10 3. Results texted to Dr. Laurence Slate Electronically Signed   By: Marlan Palau M.D.   On: 10/19/2019 12:48   Assessment:  Ms. JONNELL HENTGES is  a 39 y.o. female presenting with nausea, passing out and then possible brief right side weakness. She did not receive IV t-PA due to highly variable and improving exam as well as other dx of complex migraine. Plan: Symptomatic h/a tx No further vascular wk up as this was done less than 68mo ago No further stroke wk up at this time    Attending Neurologist's note to follow  Desiree Metzger-Cihelka, ARNP-C, ANVP-BC Pager: [phone]   NEUROHOSPITALIST ADDENDUM Performed a face to face diagnostic evaluation.   I have reviewed the contents of history and physical exam as documented by PA/ARNP/Resident and agree with above documentation.  I have discussed and formulated the above plan as documented. Edits to the note have been made as needed.  Likely complex migraine, no need for MRI brain .    Georgiana Spinner Arrington Bencomo MD [facility] [phone]   If 7pm to 7am, please call on call as listed on AMION.

## 2019-10-19 NOTE — ED Provider Notes (Signed)
[facility] EMERGENCY DEPARTMENT Provider Note   CSN: [id number] Arrival date & time: 10/19/19  1242  An emergency department physician performed an initial assessment on this suspected stroke patient at 1235.  History Chief Complaint  Patient presents with  . Code Stroke    Carmen [name] is a 39 y.o. female.  HPI Patient presents as a code stroke.  Met at the bridge by myself and neurology upon arrival.  Was reportedly at work when she had syncopal episode.  Has had a headache for a few hours.  Reportedly passed out and woke up with difficulty moving her right side.  May have had some difficulty speaking.  No loss of bladder or bowel control.  In December had similar episode where she got TPA.  However work-up at that time was negative.  Thought to be either aborted stroke versus complicated migraine or some other stress reaction.  States symptoms are improving now somewhat however.  Headache is dull.  Not on anticoagulation.  Does have a pacemaker.     Past Medical History:  Diagnosis Date  . Anxiety   . Costochondritis 09/13/2010  . Depression   . GERD (gastroesophageal reflux disease)   . Headache    migrains  . Heart palpitations    long history of   . History of chest pain   . History of cholecystitis    Laparoscopic cholecystectomy  . History of fatigue   . History of kidney stones   . History of migraine headaches   . History of seizure disorder    remote history of seizure activity and was evaluated at [facility] for this in the past  She is presently not on any seizure  medications and has not had any seizures for many years.  . History of tilt table evaluation    IMPRESSION: Positive tilt table study with significant cardioinhibitory  response.  Marland Kitchen Hyperlipidemia   . Hypertension   . Hypothyroidism   . Kidney stones    history of  . Neurocardiogenic syncope   . Pericarditis 09/13/2010  . Presence of permanent cardiac pacemaker   . PSVT  (paroxysmal supraventricular tachycardia) ([place])   . Right ureteral calculus   . Sleep apnea    osa no cpap needed  . Sleeping difficulties   . Stress   . Stroke ([place]) 06/15/2019   no residual deficits, tpa given at time of stroke  . SVD (spontaneous vaginal delivery)    x 1  . Syncope and collapse    neurocardiogenic    Patient Active Problem List   Diagnosis Date Noted  . Hyperlipidemia 06/18/2019  . Stroke-like episode s/p tPA 06/15/2019  . Pelvic prolapse 11/17/2017  . Incontinence of urine in female 11/14/2017  . PSVT (paroxysmal supraventricular tachycardia) ([place])   . Heart palpitations   . Syncope and collapse   . Sleeping difficulties   . Pericarditis 09/13/2010  . Syncope 09/13/2010  . Costochondritis 09/13/2010    Past Surgical History:  Procedure Laterality Date  . ANTERIOR AND POSTERIOR REPAIR N/A 11/14/2017   Procedure: ANTERIOR (CYSTOCELE) AND POSTERIOR REPAIR (RECTOCELE);  Surgeon: Delsa Bern, MD;  Location: [facility];  Service: Gynecology;  Laterality: N/A;  . BILATERAL SALPINGECTOMY Bilateral 11/14/2017   Procedure: BILATERAL SALPINGECTOMY;  Surgeon: Delsa Bern, MD;  Location: [facility];  Service: Gynecology;  Laterality: Bilateral;  . BLADDER SUSPENSION Bilateral 11/14/2017   Procedure: TRANSVAGINAL TAPE (TVT) PROCEDURE;  Surgeon: Everett Graff, MD;  Location: [facility];  Service: Gynecology;  Laterality: Bilateral;  . CHOLECYSTECTOMY  11/2007   Cholecystitis - Laparoscopic cholecystectomy with intraoperative  cholangiogram.  . CHOLECYSTECTOMY    . CYSTOSCOPY N/A 11/14/2017   Procedure: CYSTOSCOPY;  Surgeon: Everett Graff, MD;  Location: [facility];  Service: Gynecology;  Laterality: N/A;  . CYSTOSCOPY WITH RETROGRADE PYELOGRAM, URETEROSCOPY AND STENT PLACEMENT Right 06/25/2019   Procedure: CYSTOSCOPY WITH RETROGRADE PYELOGRAM, AND RIGHT  STENT PLACEMENT;  Surgeon: Robley Fries, MD;  Location: [facility];  Service: Urology;  Laterality: Right;  . CYSTOSCOPY  WITH RETROGRADE PYELOGRAM, URETEROSCOPY AND STENT PLACEMENT Right 07/07/2019   Procedure: CYSTOSCOPY , URETEROSCOPY AND STENT REPLACEMENT;  Surgeon: Robley Fries, MD;  Location: [facility];  Service: Urology;  Laterality: Right;  . HOLMIUM LASER APPLICATION Right 2/63/3354   Procedure: HOLMIUM LASER APPLICATION;  Surgeon: Robley Fries, MD;  Location: [facility];  Service: Urology;  Laterality: Right;  . PACEMAKER INSERTION  2016  . Tilt Table Study  07/17/2005   Positive tilt table study with significant cardioinhibitory  response.  Marland Kitchen VAGINAL HYSTERECTOMY N/A 11/14/2017   Procedure: HYSTERECTOMY VAGINAL;  Surgeon: Delsa Bern, MD;  Location: [facility];  Service: Gynecology;  Laterality: N/A;  . WISDOM TOOTH EXTRACTION       OB History    Gravida  2   Para  1   Term  1   Preterm      AB      Living  1     SAB      TAB      Ectopic      Multiple      Live Births              Family History  Problem Relation Age of Onset  . Hypertension Mother   . Heart disease Mother   . Cancer Mother        breast  . Heart attack Father   . Hyperlipidemia Father   . Hepatitis C Father   . Cancer Maternal Aunt        Breast    Social History   Tobacco Use  . Smoking status: Never Smoker  . Smokeless tobacco: Never Used  Substance Use Topics  . Alcohol use: Yes    Comment: rare  . Drug use: No    Home Medications Prior to Admission medications   Medication Sig Start Date End Date Taking? Authorizing Provider  AIMOVIG 140 MG/ML SOAJ Inject 140 mg into the skin every 30 (thirty) days. 05/26/19   [provider]  aspirin 325 MG tablet Take 325 mg by mouth daily.    [provider]  atorvastatin (LIPITOR) 80 MG tablet Take 1 tablet (80 mg total) by mouth daily at 6 PM. Patient taking differently: Take 80 mg by mouth daily.  06/18/19   Donzetta Starch, NP  baclofen (LIORESAL) 10 MG tablet Take 10 mg by mouth 3  (three) times daily as needed (headache).  05/26/19   [provider]  BIOTIN PO Take 1 capsule by mouth daily.    [provider]  cholecalciferol (VITAMIN D3) 25 MCG (1000 UT) tablet Take 1,000 Units by mouth daily.    [provider]  fenofibrate 160 MG tablet Take 1 tablet (160 mg total) by mouth daily. 06/18/19   Donzetta Starch, NP  HYDROcodone-acetaminophen (NORCO/VICODIN) 5-325 MG tablet Take 1 tablet by mouth every 6 (six) hours as needed. 06/21/19   Margarita Mail, PA-C  levothyroxine (SYNTHROID, LEVOTHROID)  50 MCG tablet Take 50 mcg by mouth daily before breakfast.    [provider]  Magnesium 400 MG CAPS Take 400 mg by mouth daily.    [provider]  metoprolol succinate (TOPROL-XL) 50 MG 24 hr tablet Take 100 mg by mouth daily. Take with or immediately following a meal.    [provider]  nitrofurantoin (MACRODANTIN) 100 MG capsule Take 100 mg by mouth once as needed (UTI).  06/23/19   [provider]  NURTEC 75 MG TBDP Take 1 tablet by mouth daily as needed (migraine).  06/06/19   [provider]  omeprazole (PRILOSEC) 40 MG capsule Take 40 mg by mouth every other day.     [provider]  oxybutynin (DITROPAN) 5 MG tablet Take 1 tablet (5 mg total) by mouth every 8 (eight) hours as needed for bladder spasms. 06/25/19   Robley Fries, MD  phenazopyridine (PYRIDIUM) 100 MG tablet Take 99.5 mg by mouth 3 (three) times daily as needed (urinary pain).     [provider]  prochlorperazine (COMPAZINE) 10 MG tablet Take 10 mg by mouth every 8 (eight) hours as needed. 05/26/19   [provider]  senna-docusate (SENOKOT-S) 8.6-50 MG tablet Take 2 tablets by mouth daily as needed for mild constipation. 06/25/19 06/24/20  Robley Fries, MD  SUMAtriptan (IMITREX) 100 MG tablet Take 100 mg by mouth every 2 (two) hours as needed for migraine. May repeat in 2 hours if headache persists or  recurs.    [provider]  TROKENDI XR 100 MG CP24 Take 1 capsule by mouth daily. 05/14/19   [provider]  valACYclovir (VALTREX) 1000 MG tablet Take 1,000 mg by mouth daily as needed (flare up).  06/14/19   [provider]  venlafaxine XR (EFFEXOR-XR) 150 MG 24 hr capsule Take 150 mg by mouth daily.    [provider]  zolpidem (AMBIEN) 10 MG tablet Take 10 mg by mouth at bedtime as needed for sleep.    [provider]    Allergies    Tape, Dilaudid [hydromorphone hcl], Prochlorperazine, and Quinolones  Review of Systems   Review of Systems  Constitutional: Negative for appetite change.  HENT: Negative for congestion.   Respiratory: Negative for shortness of breath.   Genitourinary: Negative for flank pain.  Musculoskeletal: Negative for gait problem.  Skin: Negative for rash.  Neurological: Positive for syncope, speech difficulty and weakness.  Psychiatric/Behavioral: Negative for confusion.    Physical Exam Updated Vital Signs BP 128/83   Pulse 70   Resp 10   LMP  (LMP Unknown)   SpO2 97%   Physical Exam Vitals and nursing note reviewed.  HENT:     Head: Atraumatic.     Comments: Right cheek held back until smile, when it normalizes. Eyes:     Extraocular Movements: Extraocular movements intact.     Pupils: Pupils are equal, round, and reactive to light.  Cardiovascular:     Rate and Rhythm: Regular rhythm.  Pulmonary:     Breath sounds: No stridor. No wheezing.  Abdominal:     Tenderness: There is no abdominal tenderness.  Musculoskeletal:        General: No tenderness.     Cervical back: Neck supple.  Skin:    General: Skin is warm.     Capillary Refill: Capillary refill takes less than 2 seconds.  Neurological:     Mental Status: She is alert and oriented to person, place, and  time.     Comments: Eye movements intact.  No drift on upper extremities.  Potentially mild weakness right upper extremity but not  consistent.  Finger-nose intact bilaterally.  Complete NIH scoring done by neurology.     ED Results / Procedures / Treatments   Labs (all labs ordered are listed, but only abnormal results are displayed) Labs Reviewed  COMPREHENSIVE METABOLIC PANEL - Abnormal; Notable for the following components:      Result Value   CO2 21 (*)    All other components within normal limits  I-STAT CHEM 8, ED - Abnormal; Notable for the following components:   BUN 21 (*)    All other components within normal limits  ETHANOL  PROTIME-INR  APTT  CBC  DIFFERENTIAL  RAPID URINE DRUG SCREEN, HOSP PERFORMED  I-STAT BETA HCG BLOOD, ED ([facility], [facility], [facility] ONLY)  I-STAT BETA HCG BLOOD, ED (NOT ORDERABLE)  CBG MONITORING, ED    EKG None  Radiology CT HEAD CODE STROKE WO CONTRAST  Result Date: 10/19/2019 CLINICAL DATA:  Code stroke. Ataxia. Rule out stroke. Right-sided weakness. EXAM: CT HEAD WITHOUT CONTRAST TECHNIQUE: Contiguous axial images were obtained from the base of the skull through the vertex without intravenous contrast. COMPARISON:  CT head 06/16/2019 FINDINGS: Brain: No evidence of acute infarction, hemorrhage, hydrocephalus, extra-axial collection or mass lesion/mass effect. Vascular: Negative for hyperdense vessel Skull: Negative Sinuses/Orbits: Negative Other: None ASPECTS ([place] Stroke Program Early CT Score) - Ganglionic level infarction (caudate, lentiform nuclei, internal capsule, insula, M1-M3 cortex): 7 - Supraganglionic infarction (M4-M6 cortex): 3 Total score (0-10 with 10 being normal): 10 IMPRESSION: 1. Negative CT head 2. ASPECTS is 10 3. Results texted to Dr. Lorraine Lax Electronically Signed   By: Franchot Gallo M.D.   On: 10/19/2019 12:48    Procedures Procedures (including critical care time)  Medications Ordered in ED Medications  ketorolac (TORADOL) 30 MG/ML injection 15 mg (15 mg Intravenous Given 10/19/19 1335)  metoCLOPramide (REGLAN) injection 10 mg (10 mg Intravenous Given  10/19/19 1335)  diphenhydrAMINE (BENADRYL) injection 12.5 mg (12.5 mg Intravenous Given 10/19/19 1354)    ED Course  I have reviewed the triage vital signs and the nursing notes.  Pertinent labs & imaging results that were available during my care of the patient were reviewed by me and considered in my medical decision making (see chart for details).    MDM Rules/Calculators/A&P                      Patient came as a code stroke.  However has had similar symptoms where she got TPA with a negative work-up.  Symptoms improving here.  Negative head CT.  Has had a headache so complicated migraine considered.  Will treat for migraine.  Given Reglan due to previous episodes with Compazine.  On Phenergan at home however.  Reglan did give some mild akathisia.  Treated with Benadryl.  Patient feels better.  Will discharge home.  Discussed with patient and her boyfriend. Think more likely complicated migraine versus some sort of stress reaction.  TIA and stroke felt less likely.  Will have patient follow with her neurologist.  Final Clinical Impression(s) / ED Diagnoses Final diagnoses:  Complicated migraine    Rx / DC Orders ED Discharge Orders    None       Davonna Belling, MD 10/19/19 1456

## 2019-10-19 NOTE — Code Documentation (Signed)
Patient [facility] from [facility]. She was LKW at 1130  when she suddenly felt sick to her stomach, went to the bathroom to dry heave. She came back out and told a coworker she wasn't feeling right. They returned and found her on the ground.  Per coworkers she took about five minutes to come around.When she awoke she had some right-sided weakness. EMS arrived and called a Code Stroke. On arrival to [facility] pt feeling weak with a slight drift in her right leg. NIHSS 1. She does claim she has had a headache for the past week and had a similar workup for migraines in the past. TPA not given d/t stroke not suspected. Code Stroke canceled and pt treated for migraine. Handoff given to [employer]. Nishi Neiswonger, [place] Scrape, RN

## 2019-10-19 NOTE — Discharge Instructions (Addendum)
Follow-up with your Neurologist as needed 

## 2019-10-19 NOTE — ED Notes (Signed)
Patient verbalizes understanding of discharge instructions. Opportunity for questioning and answers were provided. Armband removed by staff, pt discharged from ED.  

## 2019-10-30 ENCOUNTER — Other Ambulatory Visit: Payer: Self-pay | Admitting: Family Medicine

## 2019-10-30 ENCOUNTER — Other Ambulatory Visit: Payer: Self-pay

## 2019-10-30 ENCOUNTER — Ambulatory Visit
Admission: RE | Admit: 2019-10-30 | Discharge: 2019-10-30 | Disposition: A | Discharge status: Home / Self Care | Payer: Medicaid Other | Source: Ambulatory Visit | Attending: Family Medicine | Admitting: Family Medicine

## 2019-10-30 DIAGNOSIS — Z1231 Encounter for screening mammogram for malignant neoplasm of breast: Secondary | ICD-10-CM

## 2020-02-09 ENCOUNTER — Emergency Department (HOSPITAL_BASED_OUTPATIENT_CLINIC_OR_DEPARTMENT_OTHER)
Admission: EM | Admit: 2020-02-09 | Discharge: 2020-02-09 | Disposition: A | Discharge status: Home / Self Care | Payer: Medicaid Other | Attending: Emergency Medicine | Admitting: Emergency Medicine

## 2020-02-09 ENCOUNTER — Other Ambulatory Visit: Payer: Self-pay

## 2020-02-09 ENCOUNTER — Encounter (HOSPITAL_BASED_OUTPATIENT_CLINIC_OR_DEPARTMENT_OTHER): Payer: Self-pay

## 2020-02-09 ENCOUNTER — Emergency Department (HOSPITAL_BASED_OUTPATIENT_CLINIC_OR_DEPARTMENT_OTHER): Payer: Medicaid Other

## 2020-02-09 DIAGNOSIS — I10 Essential (primary) hypertension: Secondary | ICD-10-CM | POA: Insufficient documentation

## 2020-02-09 DIAGNOSIS — Z950 Presence of cardiac pacemaker: Secondary | ICD-10-CM | POA: Insufficient documentation

## 2020-02-09 DIAGNOSIS — Z79890 Hormone replacement therapy (postmenopausal): Secondary | ICD-10-CM | POA: Diagnosis not present

## 2020-02-09 DIAGNOSIS — Z7982 Long term (current) use of aspirin: Secondary | ICD-10-CM | POA: Diagnosis not present

## 2020-02-09 DIAGNOSIS — R55 Syncope and collapse: Secondary | ICD-10-CM | POA: Diagnosis not present

## 2020-02-09 DIAGNOSIS — E039 Hypothyroidism, unspecified: Secondary | ICD-10-CM | POA: Insufficient documentation

## 2020-02-09 DIAGNOSIS — Z79899 Other long term (current) drug therapy: Secondary | ICD-10-CM | POA: Insufficient documentation

## 2020-02-09 LAB — BASIC METABOLIC PANEL
Anion gap: 8 (ref 5–15)
BUN: 13 mg/dL (ref 6–20)
CO2: 23 mmol/L (ref 22–32)
Calcium: 9.1 mg/dL (ref 8.9–10.3)
Chloride: 106 mmol/L (ref 98–111)
Creatinine, Ser: 0.71 mg/dL (ref 0.44–1.00)
GFR calc Af Amer: >60 mL/min (ref >60)
GFR calc non Af Amer: >60 mL/min (ref >60)
Glucose, Bld: 99 mg/dL (ref 70–99)
Potassium: 3.7 mmol/L (ref 3.5–5.1)
Sodium: 137 mmol/L (ref 135–145)

## 2020-02-09 LAB — TROPONIN I (HIGH SENSITIVITY)
Troponin I (High Sensitivity): <2 ng/L (ref <18)
Troponin I (High Sensitivity): <2 ng/L (ref <18)

## 2020-02-09 LAB — D-DIMER, QUANTITATIVE: D-Dimer, Quant: 0.32 ug/mL-FEU (ref 0.00–0.50)

## 2020-02-09 LAB — CBC
HCT: 42.1 % (ref 36.0–46.0)
Hemoglobin: 14.1 g/dL (ref 12.0–15.0)
MCH: 30.3 pg (ref 26.0–34.0)
MCHC: 33.5 g/dL (ref 30.0–36.0)
MCV: 90.5 fL (ref 80.0–100.0)
Platelets: 251 10*3/uL (ref 150–400)
RBC: 4.65 MIL/uL (ref 3.87–5.11)
RDW: 12.9 % (ref 11.5–15.5)
WBC: 6.7 10*3/uL (ref 4.0–10.5)
nRBC: 0 % (ref 0.0–0.2)

## 2020-02-09 LAB — CBG MONITORING, ED: Glucose-Capillary: 72 mg/dL (ref 70–99)

## 2020-02-09 NOTE — ED Provider Notes (Signed)
[facility] EMERGENCY DEPARTMENT Provider Note   CSN: [id number] Arrival date & time: 02/09/20  0831     History Chief Complaint  Patient presents with  . Loss of Consciousness    Carmen [name] is a 39 y.o. female.  39 yo F with a chief complaints of a syncopal event.  Patient has had these occur in the past.  Has a history of POTS also has a pacemaker in place.  Patient states that she woke up late for work and was headed out the door and collapsed and woke up in the yard.  She does not remember passing out just remember waking up.  She says she felt off this morning but has trouble describing it further.  Is felt a little bit off for about a week.  Denies cough congestion or fever denies chest pain or pressure denied headache or neck pain.  Denied nausea vomiting or diarrhea.  Denied decreased oral intake.  [facility] and she has had some shortness of breath mostly on exertion.  Was unable to do some of the hikes that she thought she should been able to perform.  Denies unilateral lower extremity edema denies hemoptysis denies recent surgery immobilization or hospitalization denies estrogen use denies history of cancer denies history of PE or DVT.  The history is provided by the patient.  Loss of Consciousness Episode history:  Single Most recent episode:  Today Duration:  5 hours Timing:  Constant Progression:  Unchanged Chronicity:  New Witnessed: no   Relieved by:  Nothing Worsened by:  Nothing Ineffective treatments:  None tried Associated symptoms: no chest pain, no dizziness, no fever, no headaches, no nausea, no palpitations, no shortness of breath and no vomiting        Past Medical History:  Diagnosis Date  . Anxiety   . Costochondritis 09/13/2010  . Depression   . GERD (gastroesophageal reflux disease)   . Headache    migrains  . Heart palpitations    long history of   . History of chest pain   . History of cholecystitis     Laparoscopic cholecystectomy  . History of fatigue   . History of kidney stones   . History of migraine headaches   . History of seizure disorder    remote history of seizure activity and [facility] in the past  She is presently not on any seizure  medications and has not had any seizures for many years.  . History of tilt table evaluation    IMPRESSION: Positive tilt table study with significant cardioinhibitory  response.  Marland Kitchen. Hyperlipidemia   . Hypertension   . Hypothyroidism   . Kidney stones    history of  . Neurocardiogenic syncope   . Pericarditis 09/13/2010  . Presence of permanent cardiac pacemaker   . PSVT (paroxysmal supraventricular tachycardia) (HCC)   . Right ureteral calculus   . Sleep apnea    osa no cpap needed  . Sleeping difficulties   . Stress   . Stroke (HCC) 06/15/2019   no residual deficits, tpa given at time of stroke  . SVD (spontaneous vaginal delivery)    x 1  . Syncope and collapse    neurocardiogenic    Patient Active Problem List   Diagnosis Date Noted  . Hyperlipidemia 06/18/2019  . Stroke-like episode s/p tPA 06/15/2019  . Pelvic prolapse 11/17/2017  . Incontinence of urine in female 11/14/2017  . PSVT (paroxysmal  supraventricular tachycardia) (HCC)   . Heart palpitations   . Syncope and collapse   . Sleeping difficulties   . Pericarditis 09/13/2010  . Syncope 09/13/2010  . Costochondritis 09/13/2010    Past Surgical History:  Procedure Laterality Date  . ANTERIOR AND POSTERIOR REPAIR N/A 11/14/2017   Procedure: ANTERIOR (CYSTOCELE) AND POSTERIOR REPAIR (RECTOCELE);  Surgeon: Silverio Lay, MD;  Location: [facility];  Service: Gynecology;  Laterality: N/A;  . BILATERAL SALPINGECTOMY Bilateral 11/14/2017   Procedure: BILATERAL SALPINGECTOMY;  Surgeon: Silverio Lay, MD;  Location: [facility];  Service: Gynecology;  Laterality: Bilateral;  . BLADDER SUSPENSION Bilateral 11/14/2017   Procedure: TRANSVAGINAL TAPE (TVT)  PROCEDURE;  Surgeon: Osborn Coho, MD;  Location: [facility];  Service: Gynecology;  Laterality: Bilateral;  . CHOLECYSTECTOMY  11/2007   Cholecystitis - Laparoscopic cholecystectomy with intraoperative  cholangiogram.  . CHOLECYSTECTOMY    . CYSTOSCOPY N/A 11/14/2017   Procedure: CYSTOSCOPY;  Surgeon: Osborn Coho, MD;  Location: [facility];  Service: Gynecology;  Laterality: N/A;  . CYSTOSCOPY WITH RETROGRADE PYELOGRAM, URETEROSCOPY AND STENT PLACEMENT Right 06/25/2019   Procedure: CYSTOSCOPY WITH RETROGRADE PYELOGRAM, AND RIGHT  STENT PLACEMENT;  Surgeon: Noel Christmas, MD;  Location: [facility];  Service: Urology;  Laterality: Right;  . CYSTOSCOPY WITH RETROGRADE PYELOGRAM, URETEROSCOPY AND STENT PLACEMENT Right 07/07/2019   Procedure: CYSTOSCOPY , URETEROSCOPY AND STENT REPLACEMENT;  Surgeon: Noel Christmas, MD;  Location: [facility];  Service: Urology;  Laterality: Right;  . HOLMIUM LASER APPLICATION Right 07/07/2019   Procedure: HOLMIUM LASER APPLICATION;  Surgeon: Noel Christmas, MD;  Location: [facility];  Service: Urology;  Laterality: Right;  . PACEMAKER INSERTION  2016  . Tilt Table Study  07/17/2005   Positive tilt table study with significant cardioinhibitory  response.  Marland Kitchen VAGINAL HYSTERECTOMY N/A 11/14/2017   Procedure: HYSTERECTOMY VAGINAL;  Surgeon: Silverio Lay, MD;  Location: [facility];  Service: Gynecology;  Laterality: N/A;  . WISDOM TOOTH EXTRACTION       OB History    Gravida  2   Para  1   Term  1   Preterm      AB      Living  1     SAB      TAB      Ectopic      Multiple      Live Births              Family History  Problem Relation Age of Onset  . Hypertension Mother   . Heart disease Mother   . Cancer Mother        breast  . Breast cancer Mother 2  . Heart attack Father   . Hyperlipidemia Father   . Hepatitis C Father   . Cancer Maternal Aunt        Breast  . Breast cancer Maternal Aunt 4  .  Breast cancer Maternal Grandmother 18    Social History   Tobacco Use  . Smoking status: Never Smoker  . Smokeless tobacco: Never Used  Vaping Use  . Vaping Use: Never used  Substance Use Topics  . Alcohol use: Yes    Comment: rare  . Drug use: No    Home Medications Prior to Admission medications   Medication Sig Start Date End Date Taking? Authorizing Provider  AIMOVIG 140 MG/ML SOAJ Inject 140 mg into the skin every 30 (thirty) days. 05/26/19   [provider]  aspirin 325 MG tablet Take  325 mg by mouth daily.    [provider]  atorvastatin (LIPITOR) 80 MG tablet Take 1 tablet (80 mg total) by mouth daily at 6 PM. Patient taking differently: Take 80 mg by mouth daily.  06/18/19   Layne Benton, NP  baclofen (LIORESAL) 10 MG tablet Take 10 mg by mouth 3 (three) times daily as needed (headache).  05/26/19   [provider]  BIOTIN PO Take 1 capsule by mouth daily.    [provider]  cholecalciferol (VITAMIN D3) 25 MCG (1000 UT) tablet Take 1,000 Units by mouth daily.    [provider]  fenofibrate 160 MG tablet Take 1 tablet (160 mg total) by mouth daily. 06/18/19   Layne Benton, NP  HYDROcodone-acetaminophen (NORCO/VICODIN) 5-325 MG tablet Take 1 tablet by mouth every 6 (six) hours as needed. 06/21/19   Arthor Captain, PA-C  levothyroxine (SYNTHROID, LEVOTHROID) 50 MCG tablet Take 50 mcg by mouth daily before breakfast.    [provider]  Magnesium 400 MG CAPS Take 400 mg by mouth daily.    [provider]  metoprolol succinate (TOPROL-XL) 50 MG 24 hr tablet Take 100 mg by mouth daily. Take with or immediately following a meal.    [provider]  nitrofurantoin (MACRODANTIN) 100 MG capsule Take 100 mg by mouth once as needed (UTI).  06/23/19   [provider]  NURTEC 75 MG TBDP Take 1 tablet by mouth daily as needed (migraine).  06/06/19   [provider]  omeprazole (PRILOSEC) 40 MG  capsule Take 40 mg by mouth every other day.     [provider]  oxybutynin (DITROPAN) 5 MG tablet Take 1 tablet (5 mg total) by mouth every 8 (eight) hours as needed for bladder spasms. 06/25/19   Noel Christmas, MD  phenazopyridine (PYRIDIUM) 100 MG tablet Take 99.5 mg by mouth 3 (three) times daily as needed (urinary pain).     [provider]  prochlorperazine (COMPAZINE) 10 MG tablet Take 10 mg by mouth every 8 (eight) hours as needed. 05/26/19   [provider]  senna-docusate (SENOKOT-S) 8.6-50 MG tablet Take 2 tablets by mouth daily as needed for mild constipation. 06/25/19 06/24/20  Noel Christmas, MD  SUMAtriptan (IMITREX) 100 MG tablet Take 100 mg by mouth every 2 (two) hours as needed for migraine. May repeat in 2 hours if headache persists or recurs.    [provider]  TROKENDI XR 100 MG CP24 Take 1 capsule by mouth daily. 05/14/19   [provider]  valACYclovir (VALTREX) 1000 MG tablet Take 1,000 mg by mouth daily as needed (flare up).  06/14/19   [provider]  venlafaxine XR (EFFEXOR-XR) 150 MG 24 hr capsule Take 150 mg by mouth daily.    [provider]  zolpidem (AMBIEN) 10 MG tablet Take 10 mg by mouth at bedtime as needed for sleep.    [provider]    Allergies    Tape, Dilaudid [hydromorphone hcl], Prochlorperazine, and Quinolones  Review of Systems   Review of Systems  Constitutional: Negative for chills and fever.  HENT: Negative for congestion and rhinorrhea.   Eyes: Negative for redness and visual disturbance.  Respiratory: Negative for shortness of breath and wheezing.   Cardiovascular: Positive for syncope. Negative for chest pain and palpitations.  Gastrointestinal: Negative for nausea and vomiting.  Genitourinary: Negative for dysuria and urgency.  Musculoskeletal: Negative for arthralgias and myalgias.  Skin: Negative for pallor and wound.  Neurological: Negative for dizziness  and headaches.    Physical Exam Updated Vital Signs BP (!) 139/99 Comment: Disregard pulse rate 151; ECG rate is accurate  Pulse (!) 151 Comment: Disregard pulse rate 151; ECG rate is accurate  Temp 98.2 F (36.8 C) (Oral)   Resp 14 Comment: Disregard pulse rate 151; ECG rate is accurate  Ht  (1.575 m)   Wt 91.6 kg   LMP  (LMP Unknown)   SpO2 100% Comment: Disregard pulse rate 151; ECG rate is accurate  BMI 36.95 kg/m   Physical Exam Vitals and nursing note reviewed.  Constitutional:      General: She is not in acute distress.    Appearance: She is well-developed. She is not diaphoretic.  HENT:     Head: Normocephalic and atraumatic.  Eyes:     Pupils: Pupils are equal, round, and reactive to light.  Cardiovascular:     Rate and Rhythm: Normal rate and regular rhythm.     Heart sounds: No murmur heard.  No friction rub. No gallop.   Pulmonary:     Effort: Pulmonary effort is normal.     Breath sounds: No wheezing or rales.  Abdominal:     General: There is no distension.     Palpations: Abdomen is soft.     Tenderness: There is no abdominal tenderness.  Musculoskeletal:        General: No tenderness.     Cervical back: Normal range of motion and neck supple.  Skin:    General: Skin is warm and dry.  Neurological:     Mental Status: She is alert and oriented to person, place, and time.  Psychiatric:        Behavior: Behavior normal.     ED Results / Procedures / Treatments   Labs (all labs ordered are listed, but only abnormal results are displayed) Labs Reviewed  BASIC METABOLIC PANEL  CBC  D-DIMER, QUANTITATIVE (NOT AT [facility])  CBG MONITORING, ED  TROPONIN I (HIGH SENSITIVITY)  TROPONIN I (HIGH SENSITIVITY)    EKG EKG Interpretation  Date/Time:  Tuesday February 09 2020 08:55:38 EDT Ventricular Rate:  75 PR Interval:  158 QRS Duration: 82 QT Interval:  406 QTC Calculation: 453 R Axis:   35 Text Interpretation: Normal sinus rhythm Normal ECG no  wpw, prolonged qt or brugada Otherwise no significant change Confirmed by Melene Plan [phone]) on 02/09/2020 9:02:16 AM   Radiology DG Chest 2 View  Result Date: 02/09/2020 CLINICAL DATA:  Syncope. EXAM: CHEST - 2 VIEW COMPARISON:  08/09/2017 FINDINGS: Dual-chamber pacer leads from the left in stable position. Normal heart size and mediastinal contours. There is no edema, consolidation, effusion, or pneumothorax. IMPRESSION: No evidence of active disease. Electronically Signed   By: Marnee Spring M.D.   On: 02/09/2020 09:22    Procedures Procedures (including critical care time)   "The cardiac monitor revealed normal sinus rhythm as interpreted by me. The cardiac monitor was ordered secondary to the patient's history of syncope and to monitor the patient for dysrhythmia."   Medications Ordered in ED Medications - No data to display  ED Course  I have reviewed the triage vital signs and the nursing notes.  Pertinent labs & imaging results that were available during my care of the patient were reviewed by me and considered in my medical decision making (see chart for details).  Clinical Course as of Feb 09 1200  Tue Feb 09, 2020  1143 Ddimer negative awaiting pacemaker  interrogation    [DF]    Clinical Course User Index [DF] Melene Plan, DO   MDM Rules/Calculators/A&P                          39 yo F with a chief complaints of a syncopal event.  Patient has a history of recurrent syncope has a history of pots and also has a pacemaker placed.  Patient had an exertional event today that resulted in syncope.  She had no obvious symptoms preceding it.  Has had some shortness of breath on exertion recently and also traveled 8 hours on an airplane.  Will obtain a D-dimer.  Troponin is negative EKG without concerning finding.  Will interrogate the pacemaker.  No significant electrolyte abnormality.  Pacemaker interrogation without any events in the past couple weeks.  We will discharge the  patient home.  Cardiology and PCP follow-up.  12:01 PM:  I have discussed the diagnosis/risks/treatment options with the patient and family and believe the pt to be eligible for discharge home to follow-up with PCP, cards. We also discussed returning to the ED immediately if new or worsening sx occur. We discussed the sx which are most concerning (e.g., sudden worsening pain, fever, inability to tolerate by mouth) that necessitate immediate return. Medications administered to the patient during their visit and any new prescriptions provided to the patient are listed below.  Medications given during this visit Medications - No data to display   The patient appears reasonably screen and/or stabilized for discharge and I doubt any other medical condition or other [facility] requiring further screening, evaluation, or treatment in the ED at this time prior to discharge.     Final Clinical Impression(s) / ED Diagnoses Final diagnoses:  Syncope and collapse    Rx / DC Orders ED Discharge Orders    None       Melene Plan, DO 02/09/20 1201

## 2020-02-09 NOTE — ED Notes (Signed)
Several attempts to interrogate pacemaker unsuccessful; called [employer] at [phone] to have local rep paged.

## 2020-02-09 NOTE — ED Notes (Signed)
Kathlene November, [employer] rep, is on his way here to interrogate pacemaker.

## 2020-02-09 NOTE — Discharge Instructions (Signed)
Eat and drink as well as you can for the next couple days.  Follow up with your cardiologist.  Return for worsening or recurrent symptoms.

## 2020-02-09 NOTE — ED Triage Notes (Signed)
Pt arrives via [facility] from home after witnessed syncopal episode where pt passed out walking from home to car, landing in the grass. No injury. Pt does have [employer] demand pacemaker, no difib.

## 2020-02-10 ENCOUNTER — Other Ambulatory Visit: Payer: Self-pay

## 2020-02-10 ENCOUNTER — Emergency Department (HOSPITAL_COMMUNITY)
Admission: EM | Admit: 2020-02-10 | Discharge: 2020-02-10 | Disposition: A | Discharge status: Home / Self Care | Payer: Medicaid Other | Attending: Emergency Medicine | Admitting: Emergency Medicine

## 2020-02-10 DIAGNOSIS — Z950 Presence of cardiac pacemaker: Secondary | ICD-10-CM | POA: Insufficient documentation

## 2020-02-10 DIAGNOSIS — R55 Syncope and collapse: Secondary | ICD-10-CM | POA: Diagnosis not present

## 2020-02-10 DIAGNOSIS — E039 Hypothyroidism, unspecified: Secondary | ICD-10-CM | POA: Insufficient documentation

## 2020-02-10 DIAGNOSIS — I10 Essential (primary) hypertension: Secondary | ICD-10-CM | POA: Diagnosis not present

## 2020-02-10 LAB — CBC WITH DIFFERENTIAL/PLATELET
Abs Immature Granulocytes: 0.01 10*3/uL (ref 0.00–0.07)
Basophils Absolute: 0 10*3/uL (ref 0.0–0.1)
Basophils Relative: 0 %
Eosinophils Absolute: 0.1 10*3/uL (ref 0.0–0.5)
Eosinophils Relative: 2 %
HCT: 41.9 % (ref 36.0–46.0)
Hemoglobin: 13.9 g/dL (ref 12.0–15.0)
Immature Granulocytes: 0 %
Lymphocytes Relative: 23 %
Lymphs Abs: 1.3 10*3/uL (ref 0.7–4.0)
MCH: 30.3 pg (ref 26.0–34.0)
MCHC: 33.2 g/dL (ref 30.0–36.0)
MCV: 91.3 fL (ref 80.0–100.0)
Monocytes Absolute: 0.4 10*3/uL (ref 0.1–1.0)
Monocytes Relative: 7 %
Neutro Abs: 4.1 10*3/uL (ref 1.7–7.7)
Neutrophils Relative %: 68 %
Platelets: 211 10*3/uL (ref 150–400)
RBC: 4.59 MIL/uL (ref 3.87–5.11)
RDW: 12.9 % (ref 11.5–15.5)
WBC: 5.9 10*3/uL (ref 4.0–10.5)
nRBC: 0 % (ref 0.0–0.2)

## 2020-02-10 LAB — BASIC METABOLIC PANEL
Anion gap: 10 (ref 5–15)
BUN: 11 mg/dL (ref 6–20)
CO2: 21 mmol/L — ABNORMAL LOW (ref 22–32)
Calcium: 9.8 mg/dL (ref 8.9–10.3)
Chloride: 106 mmol/L (ref 98–111)
Creatinine, Ser: 0.76 mg/dL (ref 0.44–1.00)
GFR calc Af Amer: >60 mL/min (ref >60)
GFR calc non Af Amer: >60 mL/min (ref >60)
Glucose, Bld: 96 mg/dL (ref 70–99)
Potassium: 4.2 mmol/L (ref 3.5–5.1)
Sodium: 137 mmol/L (ref 135–145)

## 2020-02-10 LAB — TSH: TSH: 4.769 u[IU]/mL — ABNORMAL HIGH (ref 0.350–4.500)

## 2020-02-10 LAB — I-STAT BETA HCG BLOOD, ED (MC, WL, AP ONLY): I-stat hCG, quantitative: <5 m[IU]/mL (ref <5)

## 2020-02-10 LAB — CBG MONITORING, ED
Glucose-Capillary: 84 mg/dL (ref 70–99)
Glucose-Capillary: 90 mg/dL (ref 70–99)

## 2020-02-10 MED ORDER — ONDANSETRON 4 MG PO TBDP: 4.0000 mg | ORAL_TABLET | Freq: Three times a day (TID) | ORAL | 0 refills | Status: DC | PRN

## 2020-02-10 NOTE — ED Provider Notes (Signed)
[facility]-EM[facility] DEPT [facility] Emergency Department Provider Note MRN:  [id number]  Arrival date & time: 02/10/20     Chief Complaint   Loss of Consciousness   History of Present Illness   Carmen [name] is a 39 y.o. year-old female with a history of POTS presenting to the ED with chief complaint of syncope.  Syncopal episode at work, feeling generally unwell for the past few days, feeling hot and cold, sweating, mild nausea, no vomiting, no diarrhea, no chest pain or shortness of breath, no abdominal pain.  Syncopal episode yesterday as well, evaluated in the ED and discharge.  Review of Systems  A complete 10 system review of systems was obtained and all systems are negative except as noted in the HPI and PMH.   Patient's Health History    Past Medical History:  Diagnosis Date  . Anxiety   . Costochondritis 09/13/2010  . Depression   . GERD (gastroesophageal reflux disease)   . Headache    migrains  . Heart palpitations    long history of   . History of chest pain   . History of cholecystitis    Laparoscopic cholecystectomy  . History of fatigue   . History of kidney stones   . History of migraine headaches   . History of seizure disorder    remote history of seizure activity and was evaluated at [facility] for this in the past  She is presently not on any seizure  medications and has not had any seizures for many years.  . History of tilt table evaluation    IMPRESSION: Positive tilt table study with significant cardioinhibitory  response.  Marland Kitchen Hyperlipidemia   . Hypertension   . Hypothyroidism   . Kidney stones    history of  . Neurocardiogenic syncope   . Pericarditis 09/13/2010  . Presence of permanent cardiac pacemaker   . PSVT (paroxysmal supraventricular tachycardia) (HCC)   . Right ureteral calculus   . Sleep apnea    osa no cpap needed  . Sleeping difficulties   . Stress   . Stroke (HCC) 06/15/2019   no residual deficits, tpa given at time of  stroke  . SVD (spontaneous vaginal delivery)    x 1  . Syncope and collapse    neurocardiogenic    Past Surgical History:  Procedure Laterality Date  . ANTERIOR AND POSTERIOR REPAIR N/A 11/14/2017   Procedure: ANTERIOR (CYSTOCELE) AND POSTERIOR REPAIR (RECTOCELE);  Surgeon: Silverio Lay, MD;  Location: [facility];  Service: Gynecology;  Laterality: N/A;  . BILATERAL SALPINGECTOMY Bilateral 11/14/2017   Procedure: BILATERAL SALPINGECTOMY;  Surgeon: Silverio Lay, MD;  Location: [facility];  Service: Gynecology;  Laterality: Bilateral;  . BLADDER SUSPENSION Bilateral 11/14/2017   Procedure: TRANSVAGINAL TAPE (TVT) PROCEDURE;  Surgeon: Osborn Coho, MD;  Location: [facility];  Service: Gynecology;  Laterality: Bilateral;  . CHOLECYSTECTOMY  11/2007   Cholecystitis - Laparoscopic cholecystectomy with intraoperative  cholangiogram.  . CHOLECYSTECTOMY    . CYSTOSCOPY N/A 11/14/2017   Procedure: CYSTOSCOPY;  Surgeon: Osborn Coho, MD;  Location: [facility];  Service: Gynecology;  Laterality: N/A;  . CYSTOSCOPY WITH RETROGRADE PYELOGRAM, URETEROSCOPY AND STENT PLACEMENT Right 06/25/2019   Procedure: CYSTOSCOPY WITH RETROGRADE PYELOGRAM, AND RIGHT  STENT PLACEMENT;  Surgeon: Noel Christmas, MD;  Location: [facility];  Service: Urology;  Laterality: Right;  . CYSTOSCOPY WITH RETROGRADE PYELOGRAM, URETEROSCOPY AND STENT PLACEMENT Right 07/07/2019   Procedure: CYSTOSCOPY , URETEROSCOPY AND STENT REPLACEMENT;  Surgeon: Noel Christmas, MD;  Location: [facility];  Service: Urology;  Laterality: Right;  . HOLMIUM LASER APPLICATION Right 07/07/2019   Procedure: HOLMIUM LASER APPLICATION;  Surgeon: Noel Christmas, MD;  Location: [facility];  Service: Urology;  Laterality: Right;  . PACEMAKER INSERTION  2016  . Tilt Table Study  07/17/2005   Positive tilt table study with significant cardioinhibitory  response.  Marland Kitchen VAGINAL HYSTERECTOMY N/A 11/14/2017   Procedure: HYSTERECTOMY VAGINAL;   Surgeon: Silverio Lay, MD;  Location: [facility];  Service: Gynecology;  Laterality: N/A;  . WISDOM TOOTH EXTRACTION      Family History  Problem Relation Age of Onset  . Hypertension Mother   . Heart disease Mother   . Cancer Mother        breast  . Breast cancer Mother 14  . Heart attack Father   . Hyperlipidemia Father   . Hepatitis C Father   . Cancer Maternal Aunt        Breast  . Breast cancer Maternal Aunt 60  . Breast cancer Maternal Grandmother 35    Social History   Socioeconomic History  . Marital status: Married    Spouse name: Not on file  . Number of children: Not on file  . Years of education: Not on file  . Highest education level: Not on file  Occupational History  . Not on file  Tobacco Use  . Smoking status: Never Smoker  . Smokeless tobacco: Never Used  Vaping Use  . Vaping Use: Never used  Substance and Sexual Activity  . Alcohol use: Yes    Comment: rare  . Drug use: No  . Sexual activity: Not on file  Other Topics Concern  . Not on file  Social History Narrative  . Not on file   Social Determinants of Health   Financial Resource Strain:   . Difficulty of Paying Living Expenses:   Food Insecurity:   . Worried About Programme researcher, broadcasting/film/video in the Last Year:   . Barista in the Last Year:   Transportation Needs:   . Freight forwarder (Medical):   Marland Kitchen Lack of Transportation (Non-Medical):   Physical Activity:   . Days of Exercise per Week:   . Minutes of Exercise per Session:   Stress:   . Feeling of Stress :   Social Connections:   . Frequency of Communication with Friends and Family:   . Frequency of Social Gatherings with Friends and Family:   . Attends Religious Services:   . Active Member of Clubs or Organizations:   . Attends Banker Meetings:   Marland Kitchen Marital Status:   Intimate Partner Violence:   . Fear of Current or Ex-Partner:   . Emotionally Abused:   Marland Kitchen Physically Abused:   . Sexually Abused:       Physical Exam   Vitals:   02/10/20 1245 02/10/20 1252  BP: (!) 142/98   Pulse:    Resp: 19   Temp:  98.3 F (36.8 C)  SpO2:      CONSTITUTIONAL: Well-appearing, NAD NEURO:  Alert and oriented x 3, no focal deficits EYES:  eyes equal and reactive ENT/NECK:  no LAD, no JVD CARDIO: Regular rate, well-perfused, normal S1 and S2 PULM:  CTAB no wheezing or rhonchi GI/GU:  normal bowel sounds, non-distended, non-tender MSK/SPINE:  No gross deformities, no edema SKIN:  no rash, atraumatic PSYCH:  Appropriate speech and behavior  *Additional and/or pertinent findings included in MDM  below  Diagnostic and Interventional Summary    EKG Interpretation  Date/Time:  Wednesday February 10 2020 13:28:52 EDT Ventricular Rate:  84 PR Interval:    QRS Duration: 81 QT Interval:  382 QTC Calculation: 452 R Axis:   69 Text Interpretation: Sinus rhythm Confirmed by Kennis Carina [phone]) on 02/10/2020 2:03:41 PM      Labs Reviewed  BASIC METABOLIC PANEL - Abnormal; Notable for the following components:      Result Value   CO2 21 (*)    All other components within normal limits  TSH - Abnormal; Notable for the following components:   TSH 4.769 (*)    All other components within normal limits  CBC WITH DIFFERENTIAL/PLATELET  CBG MONITORING, ED  I-STAT BETA HCG BLOOD, ED ([facility], [facility], [facility] ONLY)  CBG MONITORING, ED    No orders to display    Medications - No data to display   Procedures  /  Critical Care Procedures  ED Course and Medical Decision Making  I have reviewed the triage vital signs, the nursing notes, and pertinent available records from the EMR.  Listed above are laboratory and imaging tests that I personally ordered, reviewed, and interpreted and then considered in my medical decision making (see below for details).  Recurrent syncopal episodes for the past 2 days, history of POTS, history of pacemaker.  Feeling generally unwell.  Will evaluate for metabolic disarray,  signs of arrhythmia.  Negative work-up yesterday with interrogation of pacemaker that was normal.  Could be explained simply by her underlying condition of POTS.  Awaiting laboratory evaluation, has follow-up with cardiology this coming Monday.     Work-up is reassuring, patient would like to go home.  Has good follow-up with cardiology.  She is endorsing a lot of hot flashes but I do not feel this is explained by her thyroid given her TSH level today.  She is wondering if maybe she is going through menopause.  History of hysterectomy but she has intact ovaries.  This is possible but would be difficult to confirm without GYN evaluation and possibly hormonal testing.  Will follow up with GYN as well.  The pacemaker interrogation went well, no arrhythmia events and device functioning well.  Appropriate for discharge.  Elmer Sow. Pilar Plate, MD [facility] Emergency Medicine [facility] [email or web address]  Final Clinical Impressions(s) / ED Diagnoses     ICD-10-CM   1. Syncope, unspecified syncope type  R55     ED Discharge Orders         Ordered    ondansetron (ZOFRAN ODT) 4 MG disintegrating tablet  Every 8 hours PRN     Discontinue  Reprint     02/10/20 1502           Discharge Instructions Discussed with and Provided to Patient:     Discharge Instructions     You were evaluated in the Emergency Department and after careful evaluation, we did not find any emergent condition requiring admission or further testing in the hospital.  Your exam/testing today is overall reassuring.  Your symptoms do not seem to be related to your heart.  You can use the Zofran medication at home as needed.  Consider follow-up with your GYN doctor as we discussed.  Please return to the Emergency Department if you experience any worsening of your condition.   Thank you for allowing [place] to be a part of your care.       Sabas Sous, MD 02/10/20 [id number]

## 2020-02-10 NOTE — Discharge Instructions (Addendum)
You were evaluated in the Emergency Department and after careful evaluation, we did not find any emergent condition requiring admission or further testing in the hospital.  Your exam/testing today is overall reassuring.  Your symptoms do not seem to be related to your heart.  You can use the Zofran medication at home as needed.  Consider follow-up with your GYN doctor as we discussed.  Please return to the Emergency Department if you experience any worsening of your condition.   Thank you for allowing [place] to be a part of your care.

## 2020-02-10 NOTE — ED Triage Notes (Signed)
Patient arrived via [facility] from work. Patient had a syncopal episode that was witness by co-worker. Stating it lasted 2 minutes. Co-worker lowered patient to the floor. Per EMS when they arrived patient was alert and oriented x4, skin cool and clammy and patient had a faint pulse. Patient denies any pain at this time, Patient c/o nausea and dizziness with EMS. EMS gave Zofran and of NS. Patient stated the same thing happen on 8/17 and she was transported to [facility].

## 2020-05-31 ENCOUNTER — Encounter (INDEPENDENT_AMBULATORY_CARE_PROVIDER_SITE_OTHER): Payer: Self-pay

## 2020-07-07 ENCOUNTER — Other Ambulatory Visit: Payer: Self-pay

## 2020-07-07 ENCOUNTER — Encounter (INDEPENDENT_AMBULATORY_CARE_PROVIDER_SITE_OTHER): Payer: Self-pay | Admitting: Family Medicine

## 2020-07-07 ENCOUNTER — Ambulatory Visit (INDEPENDENT_AMBULATORY_CARE_PROVIDER_SITE_OTHER): Payer: Managed Care, Other (non HMO) | Admitting: Family Medicine

## 2020-07-07 VITALS — BP 126/92 | HR 105 | Temp 97.8°F | Ht 63.0 in | Wt 202.0 lb

## 2020-07-07 DIAGNOSIS — R0602 Shortness of breath: Secondary | ICD-10-CM

## 2020-07-07 DIAGNOSIS — E782 Mixed hyperlipidemia: Secondary | ICD-10-CM | POA: Diagnosis not present

## 2020-07-07 DIAGNOSIS — R5383 Other fatigue: Secondary | ICD-10-CM

## 2020-07-07 DIAGNOSIS — I10 Essential (primary) hypertension: Secondary | ICD-10-CM

## 2020-07-07 DIAGNOSIS — E559 Vitamin D deficiency, unspecified: Secondary | ICD-10-CM | POA: Insufficient documentation

## 2020-07-07 DIAGNOSIS — Z9189 Other specified personal risk factors, not elsewhere classified: Secondary | ICD-10-CM | POA: Diagnosis not present

## 2020-07-07 DIAGNOSIS — E039 Hypothyroidism, unspecified: Secondary | ICD-10-CM

## 2020-07-07 DIAGNOSIS — Z6835 Body mass index (BMI) 35.0-35.9, adult: Secondary | ICD-10-CM

## 2020-07-07 DIAGNOSIS — F39 Unspecified mood [affective] disorder: Secondary | ICD-10-CM

## 2020-07-07 DIAGNOSIS — Z0289 Encounter for other administrative examinations: Secondary | ICD-10-CM

## 2020-07-07 DIAGNOSIS — G43909 Migraine, unspecified, not intractable, without status migrainosus: Secondary | ICD-10-CM | POA: Insufficient documentation

## 2020-07-07 DIAGNOSIS — G43809 Other migraine, not intractable, without status migrainosus: Secondary | ICD-10-CM

## 2020-07-08 LAB — COMPREHENSIVE METABOLIC PANEL
ALT: 20 IU/L (ref 0–32)
AST: 16 IU/L (ref 0–40)
Albumin/Globulin Ratio: 1.7 (ref 1.2–2.2)
Albumin: 4.8 g/dL (ref 3.8–4.8)
Alkaline Phosphatase: 61 IU/L (ref 44–121)
BUN/Creatinine Ratio: 10 (ref 9–23)
BUN: 9 mg/dL (ref 6–24)
Bilirubin Total: 0.3 mg/dL (ref 0.0–1.2)
CO2: 19 mmol/L — ABNORMAL LOW (ref 20–29)
Calcium: 9.5 mg/dL (ref 8.7–10.2)
Chloride: 106 mmol/L (ref 96–106)
Creatinine, Ser: 0.87 mg/dL (ref 0.57–1.00)
GFR calc Af Amer: 96 mL/min/{1.73_m2} (ref >59)
GFR calc non Af Amer: 83 mL/min/{1.73_m2} (ref >59)
Globulin, Total: 2.8 g/dL (ref 1.5–4.5)
Glucose: 89 mg/dL (ref 65–99)
Potassium: 3.7 mmol/L (ref 3.5–5.2)
Sodium: 142 mmol/L (ref 134–144)
Total Protein: 7.6 g/dL (ref 6.0–8.5)

## 2020-07-08 LAB — TSH: TSH: 3.11 u[IU]/mL (ref 0.450–4.500)

## 2020-07-08 LAB — CBC WITH DIFFERENTIAL/PLATELET
Basophils Absolute: 0 10*3/uL (ref 0.0–0.2)
Basos: 0 %
EOS (ABSOLUTE): 0.3 10*3/uL (ref 0.0–0.4)
Eos: 4 %
Hematocrit: 40.8 % (ref 34.0–46.6)
Hemoglobin: 14.3 g/dL (ref 11.1–15.9)
Immature Grans (Abs): 0 10*3/uL (ref 0.0–0.1)
Immature Granulocytes: 0 %
Lymphocytes Absolute: 2.1 10*3/uL (ref 0.7–3.1)
Lymphs: 32 %
MCH: 31.6 pg (ref 26.6–33.0)
MCHC: 35 g/dL (ref 31.5–35.7)
MCV: 90 fL (ref 79–97)
Monocytes Absolute: 0.4 10*3/uL (ref 0.1–0.9)
Monocytes: 6 %
Neutrophils Absolute: 3.8 10*3/uL (ref 1.4–7.0)
Neutrophils: 58 %
Platelets: 256 10*3/uL (ref 150–450)
RBC: 4.52 x10E6/uL (ref 3.77–5.28)
RDW: 12.7 % (ref 11.7–15.4)
WBC: 6.7 10*3/uL (ref 3.4–10.8)

## 2020-07-08 LAB — LIPID PANEL
Chol/HDL Ratio: 6 ratio — ABNORMAL HIGH (ref 0.0–4.4)
Cholesterol, Total: 257 mg/dL — ABNORMAL HIGH (ref 100–199)
HDL: 43 mg/dL (ref >39)
LDL Chol Calc (NIH): 170 mg/dL — ABNORMAL HIGH (ref 0–99)
Triglycerides: 234 mg/dL — ABNORMAL HIGH (ref 0–149)
VLDL Cholesterol Cal: 44 mg/dL — ABNORMAL HIGH (ref 5–40)

## 2020-07-08 LAB — INSULIN, RANDOM: INSULIN: 20 u[IU]/mL (ref 2.6–24.9)

## 2020-07-08 LAB — VITAMIN D 25 HYDROXY (VIT D DEFICIENCY, FRACTURES): Vit D, 25-Hydroxy: 25.7 ng/mL — ABNORMAL LOW (ref 30.0–100.0)

## 2020-07-08 LAB — HEMOGLOBIN A1C
Est. average glucose Bld gHb Est-mCnc: 97 mg/dL
Hgb A1c MFr Bld: 5 % (ref 4.8–5.6)

## 2020-07-08 LAB — T4, FREE: Free T4: 1.35 ng/dL (ref 0.82–1.77)

## 2020-07-08 LAB — VITAMIN B12: Vitamin B-12: 509 pg/mL (ref 232–1245)

## 2020-07-08 LAB — FOLATE: Folate: >20 ng/mL (ref >3.0)

## 2020-07-08 LAB — T3: T3, Total: 95 ng/dL (ref 71–180)

## 2020-07-12 NOTE — Progress Notes (Addendum)
Chief Complaint:   OBESITY Carmen [name] (MR# [id number]) is a 40 y.o. female who presents for evaluation and treatment of obesity and related comorbidities. Current BMI is Body mass index is 35.78 kg/m. Sumaiya has been struggling with her weight for many years and has been unsuccessful in either losing weight, maintaining weight loss, or reaching her healthy weight goal.  Desma is currently in the action stage of change and ready to dedicate time achieving and maintaining a healthier weight. Kimberlea is interested in becoming our patient and working on intensive lifestyle modifications including (but not limited to) diet and exercise for weight loss.  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 She lives with fiance, Caryn Bee, and 21 y.o. autistic son, Lindie Spruce 50% of the time. Charlye went to [facility] [employer] in the past-> worked best because "accountable" aspect. Faelynn skips breakfast everyday. She craves sweets and snacks like chips and cookies. She is getting married May 2022.  Lively's habits were reviewed today and are as follows: she thinks her family will eat healthier with her, she struggles with family and or coworkers weight loss sabotage, her desired weight loss is 27 lbs, she has been heavy most of her life, she started gaining weight in early college, her heaviest weight ever was 205 pounds, she has significant food cravings issues, she snacks frequently in the evenings, she skips meals frequently, she is frequently drinking liquids with calories, she frequently makes poor food choices and she struggles with emotional eating.  This is the patient's first visit at [facility].  The patient's NEW PATIENT PACKET that they filled out prior to today's office visit was reviewed at length and information from that paperwork was included within the following office visit note.    Included in the packet: current and past health history, medications, allergies,  ROS, gynecologic history (women only), surgical history, family history, social history, weight history, weight loss surgery history (for those that have had weight loss surgery), nutritional evaluation, mood and food questionnaire along with a depression screening (PHQ9) on all patients, an Epworth questionnaire, sleep habits questionnaire, patient life and health improvement goals questionnaire. These will all be scanned into the patient's chart under media.   During the visit, I independently reviewed the patient's EKG, bioimpedance scale results, and indirect calorimeter results. I used this information to tailor a meal plan for the patient that will help Myiesha to lose weight and will improve her obesity-related conditions going forward.  I performed a medically necessary appropriate examination and/or evaluation. I discussed the assessment and treatment plan with the patient. The patient was provided an opportunity to ask questions and all were answered. The patient agreed with the plan and demonstrated an understanding of the instructions. Labs were ordered today (unless patient declined them) and will be reviewed with the patient at our next visit unless more critical results need to be addressed immediately. Clinical information was updated and documented in the EMR.  Time spent on visit including pre-visit chart review and post-visit care was estimated to be 60 minutes.  A separate 15 minutes was spent on risk counseling (see above/below).   Depression Screen Lizmary's Food and Mood (modified PHQ-9) score was 15.  Depression screen [facility] 2/9 07/07/2020  Decreased Interest 2  Down, Depressed, Hopeless 3  PHQ - 2 Score 5  Altered sleeping 1  Tired, decreased energy 3  Change in appetite 2  Feeling bad or failure about yourself  2  Trouble concentrating 1  Moving slowly or fidgety/restless 0  Suicidal thoughts 1  PHQ-9 Score 15  Difficult doing work/chores Very difficult    Assessment/Plan:    1. Other fatigue Vannesa admits to daytime somnolence and admits to waking up still tired. Patent has a history of symptoms of daytime fatigue. Zaliah generally gets 10+ hours of sleep per night, and states that she has generally restful sleep. Snoring is not present. Apneic episodes are not present. Epworth Sleepiness Score is 6.  Plan: Marliss CzarLeigh does feel that her weight is causing her energy to be lower than it should be. Fatigue may be related to obesity, depression or many other causes. Labs will be ordered, and in the meanwhile, Marliss CzarLeigh will focus on self care including making healthy food choices, increasing physical activity and focusing on stress reduction.  - EKG 12-Lead - Vitamin B12 - CBC with Differential/Platelet - Comprehensive metabolic panel - Folate - Hemoglobin A1c - Insulin, random - Lipid panel - T3 - T4, free - TSH - VITAMIN D 25 Hydroxy (Vit-D Deficiency, Fractures)  2. SOBOE (shortness of breath on exertion) Reagen notes increasing shortness of breath with exercising and seems to be worsening over time with weight gain. She notes getting out of breath sooner with activity than she used to. This has gotten worse recently. Laronica denies shortness of breath at rest or orthopnea.  Plan: Marliss CzarLeigh does feel that she gets out of breath more easily that she used to when she exercises. Shantella's shortness of breath appears to be obesity related and exercise induced. She has agreed to work on weight loss and gradually increase exercise to treat her exercise induced shortness of breath. Will continue to monitor closely.  - Vitamin B12 - CBC with Differential/Platelet - Comprehensive metabolic panel - Folate - Hemoglobin A1c - Insulin, random - Lipid panel - T3 - T4, free - TSH - VITAMIN D 25 Hydroxy (Vit-D Deficiency, Fractures)  3. Essential hypertension Lailany is prescribed Toprol. Patient does not check her blood pressure at home. She has a pacemaker due to neurocardiogenic  syncope (sees Dr. Cly[facility]avid [facility]).  BP Readings from Last 3 Encounters:  07/07/20 (!) 126/92  02/10/20 (!) 133/99  02/09/20 104/86   Plan: Unstable.   Check labs today. Home blood pressure monitoring recommended, as BP is not at goal today. Continue current treatment plan for now and may need follow up with PCP for BP management if not better controlled next couple of OVs. We will follow closely and advise/ tx pt  Orders - CBC with Differential/Platelet - Comprehensive metabolic panel - Hemoglobin A1c - Insulin, random - Lipid panel - T3 - T4, free - TSH   4. Mixed hyperlipidemia Talena is prescribed Lipitor and Tricor and has been for a year now. She started them after her stroke-> status post TPA.  Plan: Check labs today. Prudent nutritional plan with low saturated and trans fat diet, eventually increase movement, medication management per PCP.  Orders - Comprehensive metabolic panel - Lipid panel   5. Hypothyroidism, unspecified type Marliss CzarLeigh is prescribed Synthroid 75 mcg QD. She has no current symptoms or concerns. She is managed by PCP and they just increased medication dose in August/September. Patient saw Endocrinologist, Dr. Lalla BrothersLambert, in the past.   Plan: Check labs today. Medication management per PCP/Endo. Importance of optimizing discussed with patient.  Orders - T3 - T4, free - TSH - VITAMIN D 25 Hydroxy (Vit-D Deficiency, Fractures)  6. Other migraine  without status migrainosus, not intractable Migraines managed by PCP. Mahrosh is prescribed Topamax 50 mg BID, Aimovig every month, and Imitrex prn. Patient's symptoms are stable currently.  Plan: Check labs today. Medication management per PCP. Adequate hydration is important. We will monitor.  Orders - Vitamin B12 - CBC with Differential/Platelet - Comprehensive metabolic panel - Folate - Hemoglobin A1c - Insulin, random - Lipid panel - T3 - T4, free - TSH - VITAMIN  D 25 Hydroxy (Vit-D Deficiency, Fractures)  7. Mood disorder (HCC) with emotional eating Anaysha has generalized anxiety disorder and depression. She is prescribed Effexor, diazepam, and Ambien. Patient is going to a new counselor next week. Medication management per psychiatrist, Dr. Tamela Oddi.  Plan: Medication management per Dr Kizzie Bane. I recommend CBT every 1-2 weeks to help with emotional eating and we will continue to counsel as well. Check labs today.  Orders - Vitamin B12 - CBC with Differential/Platelet - Comprehensive metabolic panel - Folate - T3 - T4, free - TSH - VITAMIN D 25 Hydroxy (Vit-D Deficiency, Fractures)  8. At risk for impaired metabolic function Due to Kaylor's current state of health and medical condition(s), she is at a significantly higher risk for impaired metabolic function.   This places the patient at much greater risk to subsequently develop cardiopulmonary conditions that can negatively affect patient's quality of life as well.  At least 26 minutes was spent on counseling Marilin about these concerns today and I stressed the importance of reversing these risks factors.   Initial goal is to lose at least 5-10% of starting weight to help reduce risk factors.   Counseling: Intensive lifestyle modifications discussed with Demiyah as most appropriate first line treatment.  she will continue to work on diet, exercise and weight loss efforts.  We will continue to reassess these conditions on a fairly regular basis in an attempt to decrease patient's overall morbidity and mortality   9. Class 2 severe obesity with serious comorbidity and body mass index (BMI) of 35.0 to 35.9 in adult, unspecified obesity type (HCC) Liddie is currently in the action stage of change and her goal is to continue with weight loss efforts. I recommend Katrina begin the structured treatment plan as follows:  She has agreed to the Category 2 Plan.  Exercise goals: As is   Behavioral modification  strategies: increasing lean protein intake, decreasing simple carbohydrates, increasing water intake, meal planning and cooking strategies, keeping healthy foods in the home and planning for success.  She was informed of the importance of frequent follow-up visits to maximize her success with intensive lifestyle modifications for her multiple health conditions. She was informed we would discuss her lab results at her next visit unless there is a critical issue that needs to be addressed sooner. [place] agreed to keep her next visit at the agreed upon time to discuss these results.  Objective:   Blood pressure (!) 126/92, pulse (!) 105, temperature 97.8 F (36.6 C), height 5\' 3"  (1.6 m), weight 202 lb (91.6 kg), SpO2 99 %. Body mass index is 35.78 kg/m.  EKG: Normal sinus rhythm, rate 103.  Indirect Calorimeter completed today shows a VO2 of 263 and a REE of 1831.  Her calculated basal metabolic rate is thus her basal metabolic rate is better than expected.  General: Cooperative, alert, well developed, in no acute distress. HEENT: Conjunctivae and lids unremarkable. Cardiovascular: Regular rhythm.  Lungs: Normal work of breathing. Neurologic: No focal deficits.   Attestation Statements:   Reviewed  by clinician on day of visit: allergies, medications, problem list, medical history, surgical history, family history, social history, and previous encounter notes.  Edmund Hilda, am acting as Energy manager for [employer], DO.  I have reviewed the above documentation for accuracy and completeness, and I agree with the above. Carlye Grippe, D.O.  The 21st Century Cures Act was signed into law in 2016 which includes the topic of electronic health records.  This provides immediate access to information in MyChart.  This includes consultation notes, operative notes, office notes, lab results and pathology reports.  If you have any questions about what you read please let us  know at your next visit so we can discuss your concerns and take corrective action if need be.  We are right here with you.

## 2020-07-21 ENCOUNTER — Ambulatory Visit (INDEPENDENT_AMBULATORY_CARE_PROVIDER_SITE_OTHER): Payer: Self-pay | Admitting: Family Medicine

## 2020-07-21 ENCOUNTER — Encounter (INDEPENDENT_AMBULATORY_CARE_PROVIDER_SITE_OTHER): Payer: Self-pay

## 2020-07-24 ENCOUNTER — Emergency Department (HOSPITAL_COMMUNITY): Payer: Managed Care, Other (non HMO)

## 2020-07-24 ENCOUNTER — Observation Stay (HOSPITAL_COMMUNITY)
Admission: EM | Admit: 2020-07-24 | Discharge: 2020-07-25 | Disposition: A | Discharge status: Home / Self Care | Payer: Managed Care, Other (non HMO) | Attending: Family Medicine | Admitting: Family Medicine

## 2020-07-24 DIAGNOSIS — Z950 Presence of cardiac pacemaker: Secondary | ICD-10-CM | POA: Diagnosis not present

## 2020-07-24 DIAGNOSIS — I10 Essential (primary) hypertension: Secondary | ICD-10-CM | POA: Diagnosis not present

## 2020-07-24 DIAGNOSIS — G43909 Migraine, unspecified, not intractable, without status migrainosus: Secondary | ICD-10-CM | POA: Diagnosis present

## 2020-07-24 DIAGNOSIS — Z7982 Long term (current) use of aspirin: Secondary | ICD-10-CM | POA: Diagnosis not present

## 2020-07-24 DIAGNOSIS — E785 Hyperlipidemia, unspecified: Secondary | ICD-10-CM | POA: Diagnosis not present

## 2020-07-24 DIAGNOSIS — Z79899 Other long term (current) drug therapy: Secondary | ICD-10-CM | POA: Diagnosis not present

## 2020-07-24 DIAGNOSIS — R29810 Facial weakness: Secondary | ICD-10-CM | POA: Insufficient documentation

## 2020-07-24 DIAGNOSIS — E876 Hypokalemia: Secondary | ICD-10-CM | POA: Insufficient documentation

## 2020-07-24 DIAGNOSIS — R479 Unspecified speech disturbances: Secondary | ICD-10-CM | POA: Insufficient documentation

## 2020-07-24 DIAGNOSIS — F418 Other specified anxiety disorders: Secondary | ICD-10-CM | POA: Diagnosis present

## 2020-07-24 DIAGNOSIS — Z20822 Contact with and (suspected) exposure to covid-19: Secondary | ICD-10-CM | POA: Insufficient documentation

## 2020-07-24 DIAGNOSIS — R531 Weakness: Secondary | ICD-10-CM | POA: Diagnosis not present

## 2020-07-24 DIAGNOSIS — Z8673 Personal history of transient ischemic attack (TIA), and cerebral infarction without residual deficits: Secondary | ICD-10-CM | POA: Diagnosis not present

## 2020-07-24 DIAGNOSIS — G459 Transient cerebral ischemic attack, unspecified: Secondary | ICD-10-CM

## 2020-07-24 DIAGNOSIS — E039 Hypothyroidism, unspecified: Secondary | ICD-10-CM | POA: Diagnosis not present

## 2020-07-24 LAB — CBC
HCT: 36.1 % (ref 36.0–46.0)
Hemoglobin: 12.9 g/dL (ref 12.0–15.0)
MCH: 31.8 pg (ref 26.0–34.0)
MCHC: 35.7 g/dL (ref 30.0–36.0)
MCV: 88.9 fL (ref 80.0–100.0)
Platelets: 280 10*3/uL (ref 150–400)
RBC: 4.06 MIL/uL (ref 3.87–5.11)
RDW: 12.3 % (ref 11.5–15.5)
WBC: 8.7 10*3/uL (ref 4.0–10.5)
nRBC: 0 % (ref 0.0–0.2)

## 2020-07-24 LAB — I-STAT CHEM 8, ED
BUN: 12 mg/dL (ref 6–20)
Calcium, Ion: 1.12 mmol/L — ABNORMAL LOW (ref 1.15–1.40)
Chloride: 107 mmol/L (ref 98–111)
Creatinine, Ser: 0.8 mg/dL (ref 0.44–1.00)
Glucose, Bld: 93 mg/dL (ref 70–99)
HCT: 37 % (ref 36.0–46.0)
Hemoglobin: 12.6 g/dL (ref 12.0–15.0)
Potassium: 3.2 mmol/L — ABNORMAL LOW (ref 3.5–5.1)
Sodium: 139 mmol/L (ref 135–145)
TCO2: 21 mmol/L — ABNORMAL LOW (ref 22–32)

## 2020-07-24 LAB — DIFFERENTIAL
Abs Immature Granulocytes: 0.03 10*3/uL (ref 0.00–0.07)
Basophils Absolute: 0 10*3/uL (ref 0.0–0.1)
Basophils Relative: 1 %
Eosinophils Absolute: 0.3 10*3/uL (ref 0.0–0.5)
Eosinophils Relative: 3 %
Immature Granulocytes: 0 %
Lymphocytes Relative: 31 %
Lymphs Abs: 2.7 10*3/uL (ref 0.7–4.0)
Monocytes Absolute: 0.6 10*3/uL (ref 0.1–1.0)
Monocytes Relative: 7 %
Neutro Abs: 5 10*3/uL (ref 1.7–7.7)
Neutrophils Relative %: 58 %

## 2020-07-24 LAB — I-STAT BETA HCG BLOOD, ED (MC, WL, AP ONLY): I-stat hCG, quantitative: <5 m[IU]/mL (ref <5)

## 2020-07-24 LAB — CBG MONITORING, ED: Glucose-Capillary: 84 mg/dL (ref 70–99)

## 2020-07-24 MED ORDER — SODIUM CHLORIDE 0.9% FLUSH: 3.0000 mL | Freq: Once | INTRAVENOUS | Status: DC

## 2020-07-24 MED ORDER — IOHEXOL 350 MG/ML SOLN
100.0000 mL | Freq: Once | INTRAVENOUS | Status: AC | PRN
  Administered 2020-07-24: 100 mL via INTRAVENOUS

## 2020-07-24 NOTE — Consult Note (Signed)
Referring Physician: Dr. Bebe Shaggy    Chief Complaint: Acute onset of aphasia and right sided weakness  HPI: Carmen [name] is an 40 y.o. female with a past history of tPA for diagnosis of acute stroke approximately one year ago (without residual deficits), anxiety, back pain, depression, migraines, heart palpitations, heartburn, chest pain, fatigue, kidney stones, seizure disorder (evaluated at [facility] in the past, not currently on an anticonvulsant and has had no spells for years), HLD, HTN, hypothyroidism, nephrolithiasis, neurocardiogenic syncope, PSVT, s/p pacemaker placement and sleep apnea, who presents via EMS as a Code Stroke after husband noted her to be dysphasic with right facial droop while they were watching TV in bed this evening. TOSO is the same as LKN: 10:30 PM. When EMS arrived, the patient endorsed lightheadedness and vertigo as well as right sided facial numbness.  Home medications include ASA and atorvastatin.   LSN: 10:30 PM tPA Given: No: Relatively mild deficits. The patient declined tPA after discussion of risks/benefits.   Past Medical History:  Diagnosis Date  . Anxiety   . Anxiety   . Back pain   . Costochondritis 09/13/2010  . Depression   . Depression   . Gallbladder problem   . GERD (gastroesophageal reflux disease)   . Headache    migrains  . Heart palpitations    long history of   . Heartburn   . History of chest pain   . History of cholecystitis    Laparoscopic cholecystectomy  . History of fatigue   . History of kidney stones   . History of migraine headaches   . History of seizure disorder    remote history of seizure activity and was evaluated at [facility] for this in the past  She is presently not on any seizure  medications and has not had any seizures for many years.  . History of tilt table evaluation    IMPRESSION: Positive tilt table study with significant cardioinhibitory  response.  Marland Kitchen Hyperlipidemia   . Hypertension   . Hypothyroidism    . Kidney stone   . Kidney stones    history of  . Neurocardiogenic syncope   . Palpitations   . Pericarditis 09/13/2010  . Presence of permanent cardiac pacemaker   . PSVT (paroxysmal supraventricular tachycardia) (HCC)   . Right ureteral calculus   . Sleep apnea    osa no cpap needed  . Sleeping difficulties   . SOBOE (shortness of breath on exertion)   . Stress   . Stroke (HCC) 06/15/2019   no residual deficits, tpa given at time of stroke  . Stroke (HCC)   . SVD (spontaneous vaginal delivery)    x 1  . Syncope and collapse    neurocardiogenic    Past Surgical History:  Procedure Laterality Date  . ANTERIOR AND POSTERIOR REPAIR N/A 11/14/2017   Procedure: ANTERIOR (CYSTOCELE) AND POSTERIOR REPAIR (RECTOCELE);  Surgeon: Silverio Lay, MD;  Location: [facility];  Service: Gynecology;  Laterality: N/A;  . BILATERAL SALPINGECTOMY Bilateral 11/14/2017   Procedure: BILATERAL SALPINGECTOMY;  Surgeon: Silverio Lay, MD;  Location: [facility];  Service: Gynecology;  Laterality: Bilateral;  . BLADDER SUSPENSION Bilateral 11/14/2017   Procedure: TRANSVAGINAL TAPE (TVT) PROCEDURE;  Surgeon: Osborn Coho, MD;  Location: [facility];  Service: Gynecology;  Laterality: Bilateral;  . CHOLECYSTECTOMY  11/2007   Cholecystitis - Laparoscopic cholecystectomy with intraoperative  cholangiogram.  . CHOLECYSTECTOMY    . CYSTOSCOPY N/A 11/14/2017   Procedure: CYSTOSCOPY;  Surgeon: Osborn Coho, MD;  Location: [facility];  Service: Gynecology;  Laterality: N/A;  . CYSTOSCOPY WITH RETROGRADE PYELOGRAM, URETEROSCOPY AND STENT PLACEMENT Right 06/25/2019   Procedure: CYSTOSCOPY WITH RETROGRADE PYELOGRAM, AND RIGHT  STENT PLACEMENT;  Surgeon: Noel Christmas, MD;  Location: [facility];  Service: Urology;  Laterality: Right;  . CYSTOSCOPY WITH RETROGRADE PYELOGRAM, URETEROSCOPY AND STENT PLACEMENT Right 07/07/2019   Procedure: CYSTOSCOPY , URETEROSCOPY AND STENT REPLACEMENT;  Surgeon: Noel Christmas, MD;  Location:  [facility];  Service: Urology;  Laterality: Right;  . HOLMIUM LASER APPLICATION Right 07/07/2019   Procedure: HOLMIUM LASER APPLICATION;  Surgeon: Noel Christmas, MD;  Location: [facility];  Service: Urology;  Laterality: Right;  . PACEMAKER INSERTION  2016  . Tilt Table Study  07/17/2005   Positive tilt table study with significant cardioinhibitory  response.  Marland Kitchen VAGINAL HYSTERECTOMY N/A 11/14/2017   Procedure: HYSTERECTOMY VAGINAL;  Surgeon: Silverio Lay, MD;  Location: [facility];  Service: Gynecology;  Laterality: N/A;  . WISDOM TOOTH EXTRACTION      Family History  Problem Relation Age of Onset  . Hypertension Mother   . Heart disease Mother   . Cancer Mother        breast  . Breast cancer Mother 66  . Diabetes Mother   . Hyperlipidemia Mother   . Thyroid disease Mother   . Depression Mother   . Anxiety disorder Mother   . Bipolar disorder Mother   . Liver disease Mother   . Alcoholism Mother   . Obesity Mother   . Heart attack Father   . Hyperlipidemia Father   . Hepatitis C Father   . Diabetes Father   . Hypertension Father   . Heart disease Father   . Sudden Cardiac Death Father   . Depression Father   . Anxiety disorder Father   . Liver disease Father   . Alcoholism Father   . Drug abuse Father   . Obesity Father   . Cancer Maternal Aunt        Breast  . Breast cancer Maternal Aunt 87  . Breast cancer Maternal Grandmother 44   Social History:  reports that she has never smoked. She has never used smokeless tobacco. She reports current alcohol use. She reports that she does not use drugs.  Allergies:  Allergies  Allergen Reactions  . Tape Hives    Adhesive tape - tegaderm/paper tape ok  . Dilaudid [Hydromorphone Hcl] Itching    "I just about crawled out of my skin"  . Prochlorperazine Other (See Comments)    Akathisia with compazine in ED on 05/17/19.  Resolved with repeat dose of benadryl.     . Quinolones Nausea Only     Home Medications:  No current facility-administered medications on file prior to encounter.   Current Outpatient Medications on File Prior to Encounter  Medication Sig Dispense Refill  . AIMOVIG 140 MG/ML SOAJ Inject 140 mg into the skin every 30 (thirty) days.    Marland Kitchen aspirin 325 MG tablet Take 325 mg by mouth daily.    Marland Kitchen atorvastatin (LIPITOR) 20 MG tablet Take 20 mg by mouth daily.    . diazepam (VALIUM) 5 MG tablet Take by mouth.    . EUTHYROX 75 MCG tablet Take 75 mcg by mouth daily.    . fenofibrate (TRICOR) 145 MG tablet Take 145 mg by mouth daily.    Marland Kitchen ibuprofen (ADVIL) 200 MG tablet Take 800 mg by mouth every 6 (six) hours as  needed for headache or mild pain.    . Magnesium 400 MG TABS Take by mouth.    . metoprolol succinate (TOPROL-XL) 50 MG 24 hr tablet Take 100 mg by mouth daily. Take with or immediately following a meal.    . omeprazole (PRILOSEC) 40 MG capsule Take 40 mg by mouth daily.    . Probiotic Product (PROBIOTIC PO) Take by mouth.    . protriptyline (VIVACTIL) 10 MG tablet Take 10 mg by mouth at bedtime.    . topiramate (TOPAMAX) 50 MG tablet Take 50 mg by mouth 2 (two) times daily.    Marland Kitchen venlafaxine XR (EFFEXOR-XR) 150 MG 24 hr capsule Take 150 mg by mouth daily.    Marland Kitchen zolpidem (AMBIEN) 10 MG tablet Take 10 mg by mouth at bedtime as needed.       ROS: As per HPI. Does not endorse additional complaints.   Physical Examination: There were no vitals taken for this visit.  HEENT: [place]/AT Lungs: Respirations unlabored Ext: No edema  Neurologic Examination: Mental Status: Alert, oriented x 5, anxious affect but otherwise thought content appropriate.  Speech fluent with intact comprehension and naming.  Able to follow all commands without difficulty, except for making one error on a 3-step directional command. No dysarthria but speech pattern slightly odd at times with an embellished quality.  Cranial Nerves: II:  Visual fields intact with no extinction to DSS.   III,IV, VI: EOMI. No ptosis or nystagmus.  V: Subjectively with decreased temp sensation on the left side of her face VII: Lips are "crooked" at baseline which appears due to contraction of the mentalis muscle on the right - when distracted by commands to grimace and smile, as well as with speaking, her facial muscle contractions are symmetric.  VIII: hearing intact to questions and commands IX,X: No hypophonia XI: Symmetric XII: midline tongue extension  Motor: Right : Upper extremity   5/5    Left:     Upper extremity   5/5  Lower extremity   5/5     Lower extremity   5/5 No pronator drift in BUE.  When asked to elevate lower extremities, RLE with tremulous up-and-down movements but no drift.  Sensory: Subjectively with decreased temp sensation to her RLE. Temp sensation to BUE and LLE subjectively intact. FT intact x 4 without extinction.  Deep Tendon Reflexes:  2+ bilateral brachioradialis and patellae.  Plantars: Right: downgoing   Left: downgoing Cerebellar: No ataxia with FNF bilaterally  Gait: Deferred  Results for orders placed or performed during the hospital encounter of 07/24/20 (from the past 48 hour(s))  CBG monitoring, ED     Status: None   Collection Time: 07/24/20 11:28 PM  Result Value Ref Range   Glucose-Capillary 84 70 - 99 mg/dL    Comment: Glucose reference range applies only to samples taken after fasting for at least 8 hours.   No results found.  Assessment: 40 y.o. female presenting with acute onset of speech difficulty and facial asymmetry. She has a prior admission in December 2020 for stroke work up following tPA administered by Teleneurology for focal deficits.  1. Exam reveals findings suggestive of embellishment. In the event that this is a stroke, the deficits are mild and risks of tPA are felt to outweigh benefits. Discussed with patient who expressed understanding and wish not to proceed with tPA.  2. Stroke Risk Factors - Possible prior stroke, HLD,  HTN, and sleep apnea 3. The patient has a pacemaker. She has  a history of neurocardiogenic syncope and heart palpitations.  4. Seizure disorder. She has been evaluated at [facility] in the past, not currently on an anticonvulsant and has had no spells for years. 5. CT head negative for acute abnormality. Of note, no chronic stroke is seen on her CT. 6. Negative CTA of the head and neck. No large vessel occlusion, hemodynamically significant stenosis, or other acute vascular abnormality.  Recommendations: 1. HgbA1c, fasting lipid panel 2. MRI if pacemaker is MRI-compatible 3. PT consult, OT consult, Speech consult 4. Echocardiogram 5. Prophylactic therapy- Continue ASA and atorvastatin 6. Permissive HTN x 24 hours 7. Telemetry monitoring 8. Frequent neuro checks 9. Stroke Team to follow in the AM   @Electronically  signed: Dr. Caryl Pina  07/24/2020, 11:34 PM

## 2020-07-24 NOTE — ED Provider Notes (Signed)
[facility] EMERGENCY DEPARTMENT Provider Note   CSN: [id number] Arrival date & time: 07/24/20  2326     History Chief Complaint - weakness  LEVEL 5 CAVEAT DUE TO ACUITY OF CONDITION  Carmen [name] is a 40 y.o. female.  The history is provided by the patient and the EMS personnel. The history is limited by the condition of the patient.  Weakness Patient presents concerning for occult stroke.  Last known well at approximately 2230.  Patient had right facial droop, difficulty speaking and decreased sensation of the right arm.  Patient had otherwise been at her baseline prior to this event.  She has previously had a stroke-like syndrome before.  No other details are known on arrival     Past Medical History:  Diagnosis Date  . Anxiety   . Anxiety   . Back pain   . Costochondritis 09/13/2010  . Depression   . Depression   . Gallbladder problem   . GERD (gastroesophageal reflux disease)   . Headache    migrains  . Heart palpitations    long history of   . Heartburn   . History of chest pain   . History of cholecystitis    Laparoscopic cholecystectomy  . History of fatigue   . History of kidney stones   . History of migraine headaches   . History of seizure disorder    remote history of seizure activity and was evaluated at [facility] for this in the past  She is presently not on any seizure  medications and has not had any seizures for many years.  . History of tilt table evaluation    IMPRESSION: Positive tilt table study with significant cardioinhibitory  response.  Marland Kitchen Hyperlipidemia   . Hypertension   . Hypothyroidism   . Kidney stone   . Kidney stones    history of  . Neurocardiogenic syncope   . Palpitations   . Pericarditis 09/13/2010  . Presence of permanent cardiac pacemaker   . PSVT (paroxysmal supraventricular tachycardia) (HCC)   . Right ureteral calculus   . Sleep apnea    osa no cpap needed  . Sleeping difficulties   . SOBOE  (shortness of breath on exertion)   . Stress   . Stroke (HCC) 06/15/2019   no residual deficits, tpa given at time of stroke  . Stroke (HCC)   . SVD (spontaneous vaginal delivery)    x 1  . Syncope and collapse    neurocardiogenic    Patient Active Problem List   Diagnosis Date Noted  . Mood disorder (HCC) 07/07/2020  . Other fatigue 07/07/2020  . SOBOE (shortness of breath on exertion) 07/07/2020  . Essential hypertension 07/07/2020  . Hypothyroidism 07/07/2020  . Migraine 07/07/2020  . Vitamin D deficiency 07/07/2020  . At risk for impaired metabolic function 07/07/2020  . Hyperlipidemia 06/18/2019  . Stroke-like episode s/p tPA 06/15/2019  . Pelvic prolapse 11/17/2017  . Incontinence of urine in female 11/14/2017  . PSVT (paroxysmal supraventricular tachycardia) (HCC)   . Heart palpitations   . Syncope and collapse   . Sleeping difficulties   . Pericarditis 09/13/2010  . Syncope 09/13/2010  . Costochondritis 09/13/2010    Past Surgical History:  Procedure Laterality Date  . ANTERIOR AND POSTERIOR REPAIR N/A 11/14/2017   Procedure: ANTERIOR (CYSTOCELE) AND POSTERIOR REPAIR (RECTOCELE);  Surgeon: Silverio Lay, MD;  Location: [facility];  Service: Gynecology;  Laterality: N/A;  . BILATERAL SALPINGECTOMY Bilateral 11/14/2017   Procedure:  BILATERAL SALPINGECTOMY;  Surgeon: Silverio Lay, MD;  Location: [facility];  Service: Gynecology;  Laterality: Bilateral;  . BLADDER SUSPENSION Bilateral 11/14/2017   Procedure: TRANSVAGINAL TAPE (TVT) PROCEDURE;  Surgeon: Osborn Coho, MD;  Location: [facility];  Service: Gynecology;  Laterality: Bilateral;  . CHOLECYSTECTOMY  11/2007   Cholecystitis - Laparoscopic cholecystectomy with intraoperative  cholangiogram.  . CHOLECYSTECTOMY    . CYSTOSCOPY N/A 11/14/2017   Procedure: CYSTOSCOPY;  Surgeon: Osborn Coho, MD;  Location: [facility];  Service: Gynecology;  Laterality: N/A;  . CYSTOSCOPY WITH RETROGRADE PYELOGRAM, URETEROSCOPY AND STENT  PLACEMENT Right 06/25/2019   Procedure: CYSTOSCOPY WITH RETROGRADE PYELOGRAM, AND RIGHT  STENT PLACEMENT;  Surgeon: Noel Christmas, MD;  Location: [facility];  Service: Urology;  Laterality: Right;  . CYSTOSCOPY WITH RETROGRADE PYELOGRAM, URETEROSCOPY AND STENT PLACEMENT Right 07/07/2019   Procedure: CYSTOSCOPY , URETEROSCOPY AND STENT REPLACEMENT;  Surgeon: Noel Christmas, MD;  Location: [facility];  Service: Urology;  Laterality: Right;  . HOLMIUM LASER APPLICATION Right 07/07/2019   Procedure: HOLMIUM LASER APPLICATION;  Surgeon: Noel Christmas, MD;  Location: [facility];  Service: Urology;  Laterality: Right;  . PACEMAKER INSERTION  2016  . Tilt Table Study  07/17/2005   Positive tilt table study with significant cardioinhibitory  response.  Marland Kitchen VAGINAL HYSTERECTOMY N/A 11/14/2017   Procedure: HYSTERECTOMY VAGINAL;  Surgeon: Silverio Lay, MD;  Location: [facility];  Service: Gynecology;  Laterality: N/A;  . WISDOM TOOTH EXTRACTION       OB History    Gravida  2   Para  1   Term  1   Preterm      AB      Living  1     SAB      IAB      Ectopic      Multiple      Live Births              Family History  Problem Relation Age of Onset  . Hypertension Mother   . Heart disease Mother   . Cancer Mother        breast  . Breast cancer Mother 59  . Diabetes Mother   . Hyperlipidemia Mother   . Thyroid disease Mother   . Depression Mother   . Anxiety disorder Mother   . Bipolar disorder Mother   . Liver disease Mother   . Alcoholism Mother   . Obesity Mother   . Heart attack Father   . Hyperlipidemia Father   . Hepatitis C Father   . Diabetes Father   . Hypertension Father   . Heart disease Father   . Sudden Cardiac Death Father   . Depression Father   . Anxiety disorder Father   . Liver disease Father   . Alcoholism Father   . Drug abuse Father   . Obesity Father   . Cancer Maternal Aunt        Breast  . Breast cancer  Maternal Aunt 9  . Breast cancer Maternal Grandmother 22    Social History   Tobacco Use  . Smoking status: Never Smoker  . Smokeless tobacco: Never Used  Vaping Use  . Vaping Use: Never used  Substance Use Topics  . Alcohol use: Yes    Comment: rare  . Drug use: No    Home Medications Prior to Admission medications   Medication Sig Start Date End Date Taking? Authorizing Provider  AIMOVIG 140 MG/ML SOAJ  Inject 140 mg into the skin every 30 (thirty) days. 05/26/19   [provider]  aspirin 325 MG tablet Take 325 mg by mouth daily.    [provider]  atorvastatin (LIPITOR) 20 MG tablet Take 20 mg by mouth daily. 01/18/20   [provider]  diazepam (VALIUM) 5 MG tablet Take by mouth. 05/09/20   [provider]  EUTHYROX 75 MCG tablet Take 75 mcg by mouth daily. 05/15/20   [provider]  fenofibrate (TRICOR) 145 MG tablet Take 145 mg by mouth daily. 01/18/20   [provider]  ibuprofen (ADVIL) 200 MG tablet Take 800 mg by mouth every 6 (six) hours as needed for headache or mild pain.    [provider]  Magnesium 400 MG TABS Take by mouth.    [provider]  metoprolol succinate (TOPROL-XL) 50 MG 24 hr tablet Take 100 mg by mouth daily. Take with or immediately following a meal.    [provider]  omeprazole (PRILOSEC) 40 MG capsule Take 40 mg by mouth daily.    [provider]  Probiotic Product (PROBIOTIC PO) Take by mouth.    [provider]  protriptyline (VIVACTIL) 10 MG tablet Take 10 mg by mouth at bedtime. 01/18/20   [provider]  topiramate (TOPAMAX) 50 MG tablet Take 50 mg by mouth 2 (two) times daily. 01/18/20   [provider]  venlafaxine XR (EFFEXOR-XR) 150 MG 24 hr capsule Take 150 mg by mouth daily.    [provider]  zolpidem (AMBIEN) 10 MG tablet Take 10 mg by mouth at bedtime as needed. 05/09/20   [provider]     Allergies    Tape, Dilaudid [hydromorphone hcl], Prochlorperazine, and Quinolones  Review of Systems   Review of Systems  Unable to perform ROS: Acuity of condition  Neurological: Positive for weakness.    Physical Exam Updated Vital Signs BP 124/86 (BP Location: Left Arm)   Pulse 87   Temp 98.6 F (37 C) (Temporal)   Resp 18   Wt 95.5 kg   LMP  (LMP Unknown)   SpO2 95%   BMI 37.30 kg/m   Physical Exam CONSTITUTIONAL: Well developed/well nourished HEAD: Normocephalic/atraumatic EYES: EOMI/PERRL, no nystagmus ENMT: Mucous membranes moist NECK: supple no meningeal signs CV: S1/S2 noted, no murmurs/rubs/gallops noted LUNGS: Lungs are clear to auscultation bilaterally, no apparent distress ABDOMEN: soft, nontender NEURO: Pt is awake/alert/appropriate, moves all extremitiesx4.  No significant facial droop.  No obvious arm or leg drift.  Speech is slow but fluent EXTREMITIES: pulses normal/equal, full ROM SKIN: warm, color normal PSYCH: Unable to assess  ED Results / Procedures / Treatments   Labs (all labs ordered are listed, but only abnormal results are displayed) Labs Reviewed  I-STAT CHEM 8, ED - Abnormal; Notable for the following components:      Result Value   Potassium 3.2 (*)    Calcium, Ion 1.12 (*)    TCO2 21 (*)    All other components within normal limits  SARS CORONAVIRUS 2 BY RT PCR (HOSPITAL ORDER, PERFORMED IN [facility])  PROTIME-INR  APTT  CBC  DIFFERENTIAL  COMPREHENSIVE METABOLIC PANEL  CBG MONITORING, ED  I-STAT BETA HCG BLOOD, ED ([facility], [facility], [facility] ONLY)    EKG EKG Interpretation  Date/Time:  Monday July 25 2020 00:00:27 EST Ventricular Rate:  87 PR Interval:    QRS Duration: 81 QT Interval:  382 QTC Calculation: 460 R Axis:  50 Text Interpretation: Sinus rhythm Confirmed by Zadie Rhine ([id number]) on 07/25/2020 12:12:13 AM   Radiology CT HEAD CODE STROKE WO CONTRAST  Result Date: 07/25/2020 CLINICAL DATA:   Code stroke. Initial evaluation for acute right-sided facial numbness, speech difficulty. EXAM: CT HEAD WITHOUT CONTRAST TECHNIQUE: Contiguous axial images were obtained from the base of the skull through the vertex without intravenous contrast. COMPARISON:  Prior study from 10/19/2019 FINDINGS: Brain: Examination mildly degraded by motion. Age-related cerebral atrophy noted. Small parenchymal calcification noted at the right occipital pole, stable. No acute intracranial hemorrhage. Ill-defined hyperdensity at the posterior left temporal occipital region favored to be related to motion. No acute large vessel territory infarct. No mass lesion, midline shift or mass effect. No hydrocephalus or extra-axial fluid collection. Vascular: No hyperdense vessel. Skull: Scalp soft tissues and calvarium within normal limits. Sinuses/Orbits: Globes and orbital soft tissues demonstrate no acute finding. Paranasal sinuses are clear. No mastoid effusion. Other: None. ASPECTS [facility] Stroke Program Early CT Score) - Ganglionic level infarction (caudate, lentiform nuclei, internal capsule, insula, M1-M3 cortex): 7 - Supraganglionic infarction (M4-M6 cortex): 3 Total score (0-10 with 10 being normal): 10 IMPRESSION: 1. Stable head CT. No acute intracranial infarct or other abnormality. 2. ASPECTS is 10. These results were communicated to Dr. Otelia Limes at 11:48 pmon 1/30/2022by text page via the [facility] messaging system. Electronically Signed   By: Rise Mu M.D.   On: 07/25/2020 00:00   CT ANGIO HEAD CODE STROKE  Result Date: 07/25/2020 CLINICAL DATA:  Initial evaluation for acute stroke, right-sided facial numbness, speech deficit. EXAM: CT ANGIOGRAPHY HEAD AND NECK TECHNIQUE: Multidetector CT imaging of the head and neck was performed using the standard protocol during bolus administration of intravenous contrast. Multiplanar CT image reconstructions and MIPs were obtained to evaluate the vascular anatomy. Carotid  stenosis measurements (when applicable) are obtained utilizing NASCET criteria, using the distal internal carotid diameter as the denominator. CONTRAST:  OMNIPAQUE IOHEXOL 350 MG/ML SOLN COMPARISON:  Prior head CT from earlier the same day. FINDINGS: CTA NECK FINDINGS Aortic arch: Visualized aortic arch of normal caliber with normal branch pattern. No hemodynamically significant stenosis seen about the origin of the great vessels. Right carotid system: Right common and internal carotid arteries widely patent without stenosis, dissection or occlusion. Left carotid system: Left common and internal carotid arteries widely patent without stenosis, dissection or occlusion. Vertebral arteries: Both vertebral arteries arise from the subclavian arteries. No proximal subclavian artery stenosis. Strongly dominant left vertebral artery with a markedly hypoplastic right vertebral artery. Vertebral arteries patent within the neck without stenosis, dissection or occlusion. Skeleton: No visible acute osseous abnormality. No discrete or worrisome osseous lesions. Other neck: No other acute soft tissue abnormality within the neck. No mass or adenopathy. Upper chest: Visualized upper chest demonstrates no acute finding. Left-sided pacemaker/AICD partially visualized. Review of the MIP images confirms the above findings CTA HEAD FINDINGS Anterior circulation: Both internal carotid arteries widely patent to the termini without stenosis. A1 segments patent bilaterally. Normal anterior communicating artery complex. Anterior cerebral arteries patent to their distal aspects without stenosis. No M1 stenosis or occlusion. Normal MCA bifurcations. Distal MCA branches well perfused and symmetric. Posterior circulation: Dominant left vertebral artery widely patent to the vertebrobasilar junction. Right vertebral artery faintly visible as it courses into the cranial vault, and appears to largely terminate in PICA. Neither PICA well  visualized on this exam. Basilar mildly diminutive but is patent to its distal aspect without stenosis. Superior cerebellar arteries patent bilaterally.  Left PCA supplied via a hypoplastic left P1 segment and prominent left posterior communicating artery. Fetal type origin of the right PCA. Both PCAs well perfused or distal aspects. Venous sinuses: Patent. Anatomic variants: Fetal type origin of the right PCA. Hypoplastic right vertebral artery. No aneurysm. Review of the MIP images confirms the above findings IMPRESSION: Negative CTA of the head and neck. No large vessel occlusion, hemodynamically significant stenosis, or other acute vascular abnormality. These results were communicated to Dr. Otelia Limes at 12:00 amon 1/31/2022by text page via the [facility] messaging system. Electronically Signed   By: Rise Mu M.D.   On: 07/25/2020 00:06   CT ANGIO NECK CODE STROKE  Result Date: 07/25/2020 CLINICAL DATA:  Initial evaluation for acute stroke, right-sided facial numbness, speech deficit. EXAM: CT ANGIOGRAPHY HEAD AND NECK TECHNIQUE: Multidetector CT imaging of the head and neck was performed using the standard protocol during bolus administration of intravenous contrast. Multiplanar CT image reconstructions and MIPs were obtained to evaluate the vascular anatomy. Carotid stenosis measurements (when applicable) are obtained utilizing NASCET criteria, using the distal internal carotid diameter as the denominator. CONTRAST:  OMNIPAQUE IOHEXOL 350 MG/ML SOLN COMPARISON:  Prior head CT from earlier the same day. FINDINGS: CTA NECK FINDINGS Aortic arch: Visualized aortic arch of normal caliber with normal branch pattern. No hemodynamically significant stenosis seen about the origin of the great vessels. Right carotid system: Right common and internal carotid arteries widely patent without stenosis, dissection or occlusion. Left carotid system: Left common and internal carotid arteries widely patent without  stenosis, dissection or occlusion. Vertebral arteries: Both vertebral arteries arise from the subclavian arteries. No proximal subclavian artery stenosis. Strongly dominant left vertebral artery with a markedly hypoplastic right vertebral artery. Vertebral arteries patent within the neck without stenosis, dissection or occlusion. Skeleton: No visible acute osseous abnormality. No discrete or worrisome osseous lesions. Other neck: No other acute soft tissue abnormality within the neck. No mass or adenopathy. Upper chest: Visualized upper chest demonstrates no acute finding. Left-sided pacemaker/AICD partially visualized. Review of the MIP images confirms the above findings CTA HEAD FINDINGS Anterior circulation: Both internal carotid arteries widely patent to the termini without stenosis. A1 segments patent bilaterally. Normal anterior communicating artery complex. Anterior cerebral arteries patent to their distal aspects without stenosis. No M1 stenosis or occlusion. Normal MCA bifurcations. Distal MCA branches well perfused and symmetric. Posterior circulation: Dominant left vertebral artery widely patent to the vertebrobasilar junction. Right vertebral artery faintly visible as it courses into the cranial vault, and appears to largely terminate in PICA. Neither PICA well visualized on this exam. Basilar mildly diminutive but is patent to its distal aspect without stenosis. Superior cerebellar arteries patent bilaterally. Left PCA supplied via a hypoplastic left P1 segment and prominent left posterior communicating artery. Fetal type origin of the right PCA. Both PCAs well perfused or distal aspects. Venous sinuses: Patent. Anatomic variants: Fetal type origin of the right PCA. Hypoplastic right vertebral artery. No aneurysm. Review of the MIP images confirms the above findings IMPRESSION: Negative CTA of the head and neck. No large vessel occlusion, hemodynamically significant stenosis, or other acute vascular  abnormality. These results were communicated to Dr. Otelia Limes at 12:00 amon 1/31/2022by text page via the [facility] messaging system. Electronically Signed   By: Rise Mu M.D.   On: 07/25/2020 00:06    Procedures Procedures   Medications Ordered in ED Medications  sodium chloride flush (NS) 0.9 % injection 3 mL (has no administration in time range)  metoCLOPramide (REGLAN) injection 10 mg (has no administration in time range)  diphenhydrAMINE (BENADRYL) injection 25 mg (has no administration in time range)  iohexol (OMNIPAQUE) 350 MG/ML injection 100 mL (100 mLs Intravenous Contrast Given 07/24/20 2354)    ED Course  I have reviewed the triage vital signs and the nursing notes.  Pertinent labs & imaging results that were available during my care of the patient were reviewed by me and considered in my medical decision making (see chart for details).    MDM Rules/Calculators/A&P                          12:16 AM Patient was seen on arrival with EMS.  Patient was seen in conjunction with neurology Dr. Otelia Limes.  Patient reportedly had onset of right facial droop difficulty speaking and numbness at approximately 2230.  Patient went immediately to CT imaging that was negative.  Patient is now starting to improve with her weakness.  However she does report a headache which she has had previously.  Patient reports that prior to this episode, she had been at her baseline.  However her husband reports that she has been under a great deal of stress recently.  I consulted with Dr. Otelia Limes with neurology.  He recommends admission to the hospitalist for TIA work-up.  While she is awaiting admission, will treat her headache 12:40 AM Discussed with Dr. Antionette Char for admission Final Clinical Impression(s) / ED Diagnoses Final diagnoses:  TIA (transient ischemic attack)    Rx / DC Orders ED Discharge Orders    None       Zadie Rhine, MD 07/25/20 0040

## 2020-07-25 ENCOUNTER — Encounter (HOSPITAL_COMMUNITY): Payer: Self-pay | Admitting: Emergency Medicine

## 2020-07-25 ENCOUNTER — Encounter (HOSPITAL_COMMUNITY): Payer: Managed Care, Other (non HMO)

## 2020-07-25 ENCOUNTER — Observation Stay (HOSPITAL_COMMUNITY): Payer: Managed Care, Other (non HMO)

## 2020-07-25 ENCOUNTER — Observation Stay (HOSPITAL_BASED_OUTPATIENT_CLINIC_OR_DEPARTMENT_OTHER): Payer: Managed Care, Other (non HMO)

## 2020-07-25 DIAGNOSIS — R531 Weakness: Secondary | ICD-10-CM

## 2020-07-25 DIAGNOSIS — I10 Essential (primary) hypertension: Secondary | ICD-10-CM | POA: Diagnosis not present

## 2020-07-25 DIAGNOSIS — G459 Transient cerebral ischemic attack, unspecified: Secondary | ICD-10-CM

## 2020-07-25 DIAGNOSIS — F418 Other specified anxiety disorders: Secondary | ICD-10-CM

## 2020-07-25 DIAGNOSIS — G43809 Other migraine, not intractable, without status migrainosus: Secondary | ICD-10-CM

## 2020-07-25 DIAGNOSIS — E876 Hypokalemia: Secondary | ICD-10-CM | POA: Diagnosis not present

## 2020-07-25 DIAGNOSIS — E039 Hypothyroidism, unspecified: Secondary | ICD-10-CM

## 2020-07-25 LAB — COMPREHENSIVE METABOLIC PANEL
ALT: 22 U/L (ref 0–44)
AST: 23 U/L (ref 15–41)
Albumin: 4 g/dL (ref 3.5–5.0)
Alkaline Phosphatase: 51 U/L (ref 38–126)
Anion gap: 10 (ref 5–15)
BUN: 11 mg/dL (ref 6–20)
CO2: 21 mmol/L — ABNORMAL LOW (ref 22–32)
Calcium: 9.1 mg/dL (ref 8.9–10.3)
Chloride: 107 mmol/L (ref 98–111)
Creatinine, Ser: 0.87 mg/dL (ref 0.44–1.00)
GFR, Estimated: >60 mL/min (ref >60)
Glucose, Bld: 95 mg/dL (ref 70–99)
Potassium: 3.2 mmol/L — ABNORMAL LOW (ref 3.5–5.1)
Sodium: 138 mmol/L (ref 135–145)
Total Bilirubin: 0.5 mg/dL (ref 0.3–1.2)
Total Protein: 6.6 g/dL (ref 6.5–8.1)

## 2020-07-25 LAB — ECHOCARDIOGRAM COMPLETE
Area-P 1/2: 3.12 cm2
S' Lateral: 2.3 cm
Weight: 3368.63 oz

## 2020-07-25 LAB — LIPID PANEL
Cholesterol: 220 mg/dL — ABNORMAL HIGH (ref 0–200)
HDL: 33 mg/dL — ABNORMAL LOW (ref >40)
LDL Cholesterol: 151 mg/dL — ABNORMAL HIGH (ref 0–99)
Total CHOL/HDL Ratio: 6.7 RATIO
Triglycerides: 181 mg/dL — ABNORMAL HIGH (ref <150)
VLDL: 36 mg/dL (ref 0–40)

## 2020-07-25 LAB — SARS CORONAVIRUS 2 BY RT PCR (HOSPITAL ORDER, PERFORMED IN ~~LOC~~ HOSPITAL LAB): SARS Coronavirus 2: NEGATIVE

## 2020-07-25 LAB — RAPID URINE DRUG SCREEN, HOSP PERFORMED
Amphetamines: NOT DETECTED
Barbiturates: NOT DETECTED
Benzodiazepines: POSITIVE — AB
Cocaine: NOT DETECTED
Opiates: NOT DETECTED
Tetrahydrocannabinol: NOT DETECTED

## 2020-07-25 LAB — CBC
HCT: 35.3 % — ABNORMAL LOW (ref 36.0–46.0)
Hemoglobin: 11.9 g/dL — ABNORMAL LOW (ref 12.0–15.0)
MCH: 30.4 pg (ref 26.0–34.0)
MCHC: 33.7 g/dL (ref 30.0–36.0)
MCV: 90.3 fL (ref 80.0–100.0)
Platelets: 270 10*3/uL (ref 150–400)
RBC: 3.91 MIL/uL (ref 3.87–5.11)
RDW: 12.4 % (ref 11.5–15.5)
WBC: 6.7 10*3/uL (ref 4.0–10.5)
nRBC: 0 % (ref 0.0–0.2)

## 2020-07-25 LAB — APTT: aPTT: 27 seconds (ref 24–36)

## 2020-07-25 LAB — PROTIME-INR
INR: 1 (ref 0.8–1.2)
Prothrombin Time: 12.3 seconds (ref 11.4–15.2)

## 2020-07-25 LAB — HIV ANTIBODY (ROUTINE TESTING W REFLEX): HIV Screen 4th Generation wRfx: NONREACTIVE

## 2020-07-25 LAB — HEMOGLOBIN A1C
Hgb A1c MFr Bld: 5.1 % (ref 4.8–5.6)
Mean Plasma Glucose: 99.67 mg/dL

## 2020-07-25 LAB — MAGNESIUM: Magnesium: 1.9 mg/dL (ref 1.7–2.4)

## 2020-07-25 MED ORDER — ENOXAPARIN SODIUM 40 MG/0.4ML ~~LOC~~ SOLN
40.0000 mg | Freq: Every day | SUBCUTANEOUS | Status: DC
  Administered 2020-07-25: 40 mg via SUBCUTANEOUS
  Filled 2020-07-25: qty 0.4

## 2020-07-25 MED ORDER — ACETAMINOPHEN 325 MG PO TABS: 650.0000 mg | ORAL_TABLET | ORAL | Status: DC | PRN

## 2020-07-25 MED ORDER — VENLAFAXINE HCL ER 75 MG PO CP24
150.0000 mg | ORAL_CAPSULE | Freq: Every day | ORAL | Status: DC
  Administered 2020-07-25: 150 mg via ORAL
  Filled 2020-07-25: qty 1

## 2020-07-25 MED ORDER — ASPIRIN 325 MG PO TABS
325.0000 mg | ORAL_TABLET | Freq: Every day | ORAL | Status: DC
  Administered 2020-07-25: 325 mg via ORAL
  Filled 2020-07-25: qty 1

## 2020-07-25 MED ORDER — DIPHENHYDRAMINE HCL 50 MG/ML IJ SOLN
25.0000 mg | Freq: Once | INTRAMUSCULAR | Status: AC
  Administered 2020-07-25: 25 mg via INTRAVENOUS
  Filled 2020-07-25: qty 1

## 2020-07-25 MED ORDER — ASPIRIN EC 81 MG PO TBEC
81.0000 mg | DELAYED_RELEASE_TABLET | Freq: Every day | ORAL | Status: DC
  Administered 2020-07-25: 81 mg via ORAL
  Filled 2020-07-25: qty 1

## 2020-07-25 MED ORDER — TOPIRAMATE 25 MG PO TABS: 50.0000 mg | ORAL_TABLET | Freq: Two times a day (BID) | ORAL | Status: DC

## 2020-07-25 MED ORDER — LEVOTHYROXINE SODIUM 75 MCG PO TABS
75.0000 ug | ORAL_TABLET | Freq: Every day | ORAL | Status: DC
  Administered 2020-07-25: 75 ug via ORAL
  Filled 2020-07-25 (×2): qty 1

## 2020-07-25 MED ORDER — ATORVASTATIN CALCIUM 10 MG PO TABS
20.0000 mg | ORAL_TABLET | Freq: Every day | ORAL | Status: DC
  Administered 2020-07-25: 20 mg via ORAL
  Filled 2020-07-25: qty 2

## 2020-07-25 MED ORDER — SODIUM CHLORIDE 0.9 % IV SOLN: INTRAVENOUS | Status: AC

## 2020-07-25 MED ORDER — STROKE: EARLY STAGES OF RECOVERY BOOK: Freq: Once | Status: DC

## 2020-07-25 MED ORDER — POTASSIUM CHLORIDE CRYS ER 20 MEQ PO TBCR
40.0000 meq | EXTENDED_RELEASE_TABLET | Freq: Once | ORAL | Status: AC
  Administered 2020-07-25: 40 meq via ORAL
  Filled 2020-07-25: qty 2

## 2020-07-25 MED ORDER — DIAZEPAM 5 MG PO TABS: 5.0000 mg | ORAL_TABLET | Freq: Every day | ORAL | Status: DC | PRN

## 2020-07-25 MED ORDER — CLOPIDOGREL BISULFATE 75 MG PO TABS: 75.0000 mg | ORAL_TABLET | Freq: Every day | ORAL | 0 refills | Status: DC

## 2020-07-25 MED ORDER — ACETAMINOPHEN 650 MG RE SUPP: 650.0000 mg | RECTAL | Status: DC | PRN

## 2020-07-25 MED ORDER — ATORVASTATIN CALCIUM 80 MG PO TABS: 80.0000 mg | ORAL_TABLET | Freq: Every day | ORAL | 1 refills | Status: DC

## 2020-07-25 MED ORDER — PANTOPRAZOLE SODIUM 40 MG PO TBEC
40.0000 mg | DELAYED_RELEASE_TABLET | Freq: Every day | ORAL | Status: DC
  Administered 2020-07-25: 40 mg via ORAL
  Filled 2020-07-25: qty 1

## 2020-07-25 MED ORDER — SENNOSIDES-DOCUSATE SODIUM 8.6-50 MG PO TABS: 1.0000 | ORAL_TABLET | Freq: Every evening | ORAL | Status: DC | PRN

## 2020-07-25 MED ORDER — METOCLOPRAMIDE HCL 5 MG/ML IJ SOLN
10.0000 mg | Freq: Once | INTRAMUSCULAR | Status: AC
  Administered 2020-07-25: 10 mg via INTRAVENOUS
  Filled 2020-07-25: qty 2

## 2020-07-25 MED ORDER — ACETAMINOPHEN 160 MG/5ML PO SOLN: 650.0000 mg | ORAL | Status: DC | PRN

## 2020-07-25 MED ORDER — ATORVASTATIN CALCIUM 80 MG PO TABS
80.0000 mg | ORAL_TABLET | Freq: Every day | ORAL | Status: DC
  Administered 2020-07-25: 80 mg via ORAL
  Filled 2020-07-25: qty 1

## 2020-07-25 MED ORDER — ZOLPIDEM TARTRATE 5 MG PO TABS: 5.0000 mg | ORAL_TABLET | Freq: Every evening | ORAL | Status: DC | PRN

## 2020-07-25 MED ORDER — CLOPIDOGREL BISULFATE 75 MG PO TABS
75.0000 mg | ORAL_TABLET | Freq: Every day | ORAL | Status: DC
  Administered 2020-07-25: 75 mg via ORAL
  Filled 2020-07-25: qty 1

## 2020-07-25 MED ORDER — ASPIRIN 81 MG PO TBEC: 81.0000 mg | DELAYED_RELEASE_TABLET | Freq: Every day | ORAL | 11 refills | Status: DC

## 2020-07-25 NOTE — Progress Notes (Signed)
[facility] sent. Orders received AOO 90

## 2020-07-25 NOTE — ED Notes (Signed)
Dr. Opyd at bedside  

## 2020-07-25 NOTE — Evaluation (Signed)
Occupational Therapy Evaluation Patient Details Name: Carmen [name] MRN: [id number] DOB: 05/22/1981 Today's Date: 07/25/2020    History of Present Illness Pt is a 40 yo female s/p R sided weakness and speech difficulty. Pt PMHx: depression, anxiety, migraines, bradycardia, pacemaker, seizure d/o, Th/o TIA.   Clinical Impression   Pt PTA: Pt living with s/o and son 50% of the time. Pt currently, independent with ADL and mobility in hallway and simulating stairs. Pt simulating tub transfer and no physical assist required. 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 Pt does not require continued OT skilled services as pt back to baseline. OT signing off. Thank you.      Follow Up Recommendations  No OT follow up    Equipment Recommendations  None recommended by OT    Recommendations for Other Services       Precautions / Restrictions Precautions Precautions: None Restrictions Weight Bearing Restrictions: No      Mobility Bed Mobility Overal bed mobility: Modified Independent                  Transfers Overall transfer level: Independent                    Balance Overall balance assessment: Modified Independent                                         ADL either performed or assessed with clinical judgement   ADL Overall ADL's : Modified independent                                       General ADL Comments: Pt with no focal deficits at this time. 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 Pt reports difficulty with L eye peripherally at baseline. Pt scanning well and avoidng obstacles- no deficits noted.     Perception     Praxis      Pertinent Vitals/Pain Pain Assessment: 0-10 Pain Score: 4  Pain Location: belly pain Pain Descriptors / Indicators: Discomfort;Sore Pain Intervention(s): Monitored during session     Hand Dominance Right   Extremity/Trunk Assessment Upper Extremity Assessment Upper Extremity Assessment: Overall WFL for tasks assessed;LUE deficits/detail;RUE deficits/detail RUE Deficits / Details: 4/5 strength, ROM WNLs LUE Deficits / Details: 4/5 strength, ROM WNLs   Lower Extremity Assessment Lower Extremity Assessment: Overall WFL for tasks assessed   Cervical / Trunk Assessment Cervical / Trunk Assessment: Normal   Communication Communication Communication: No difficulties   Cognition Arousal/Alertness: Awake/alert Behavior During Therapy: WFL for tasks assessed/performed Overall Cognitive Status: Within Functional Limits for tasks assessed                                     General  Comments  VSS on RA.    Exercises     Shoulder Instructions      Home Living Family/patient expects to be discharged to:: Private residence Living Arrangements: Spouse/significant other;Children (son 50%) Available Help at Discharge: Family;Available PRN/intermittently Type of Home: House Home Access: Stairs to enter [employer] of Steps: 4 Entrance Stairs-Rails: Right;Left Home Layout: One level     Bathroom Shower/Tub: Chief Strategy Officer: Standard     Home Equipment: None          Prior Functioning/Environment Level of Independence: Independent        Comments: driving; working        OT Problem List:        OT Treatment/Interventions:      OT Goals(Current goals can be found in the care  plan section) Acute Rehab OT Goals Patient Stated Goal: to go home OT Goal Formulation: All assessment and education complete, DC therapy Potential to Achieve Goals: Good  OT Frequency:     Barriers to D/C:            Co-evaluation              AM-PAC OT "6 Clicks" Daily Activity     Outcome Measure Help from another person eating meals?: None Help from another person taking care of personal grooming?: None Help from another person toileting, which includes using toliet, bedpan, or urinal?: None Help from another person bathing (including washing, rinsing, drying)?: None Help from another person to put on and taking off regular upper body clothing?: None Help from another person to put on and taking off regular lower body clothing?: None 6 Click Score: 24   End of Session Nurse Communication: Mobility status  Activity Tolerance: Patient tolerated treatment well Patient left: in bed;with call bell/phone within reach;with nursing/sitter in room  OT Visit Diagnosis: Unsteadiness on feet (R26.81)                Time: 9811-9147 OT Time Calculation (min): 29 min Charges:  OT General Charges $OT Visit: 1 Visit OT Evaluation $OT Eval Moderate Complexity: 1 Mod OT Treatments $Self Care/Home Management : 8-22 mins  Flora Lipps, OTR/L Acute Rehabilitation Services Pager: [phone] Office: [phone]   Shawne Bulow C 07/25/2020, 12:54 PM

## 2020-07-25 NOTE — Progress Notes (Signed)
  Echocardiogram 2D Echocardiogram has been performed.  Augustine Radar 07/25/2020, 8:50 AM

## 2020-07-25 NOTE — H&P (Signed)
History and Physical    Carmen [name] WGN:[id number] DOB: [birth year] DOA: 07/24/2020  PCP: Marthenia Rolling, MD   Patient coming from: Home   Chief Complaint: Right-side weakness and speech difficulty   HPI: Carmen [name] is a 40 y.o. female with medical history significant for depression, anxiety, migraines, bradycardia with dual-chamber pacemaker, and history of strokelike episode 1 year ago, now presenting to the emergency department with acute onset right-sided weakness and speech difficulty.  Patient reports that she was in her usual state until 10:30 PM when she had difficulty speaking.  She reports experiencing some weakness on her right side, mainly the right arm.  Her husband observed a right facial droop.  Patient reports some decreased sensation on the right side but mainly noticed the weakness in her right arm.  She did not feel that the leg was numb a week.  She was not having a headache at the time but later developed one in the ED that she describes as less severe than her usual migraine.  She denies any recent fevers, chills, cough, shortness of breath, chest pain, or palpitations.  ED Course: Upon arrival to the ED, patient is found to be afebrile, saturating well on room air, and with stable blood pressure.  EKG features a sinus rhythm.  Noncontrast head CT negative for acute intracranial abnormality and CTA head and neck were negative.  Chemistry panel notable for potassium 3.2 and CBC unremarkable.  COVID-19 screening test is pending.  Patient was treated with Reglan and Benadryl in the ED.  Neurology is consulting.  Hospitalists were asked to admit.  Review of Systems:  All other systems reviewed and apart from HPI, are negative.  Past Medical History:  Diagnosis Date  . Anxiety   . Anxiety   . Back pain   . Costochondritis 09/13/2010  . Depression   . Depression   . Gallbladder problem   . GERD (gastroesophageal reflux disease)   . Headache    migrains  . Heart  palpitations    long history of   . Heartburn   . History of chest pain   . History of cholecystitis    Laparoscopic cholecystectomy  . History of fatigue   . History of kidney stones   . History of migraine headaches   . History of seizure disorder    remote history of seizure activity and was evaluated at [facility] for this in the past  She is presently not on any seizure  medications and has not had any seizures for many years.  . History of tilt table evaluation    IMPRESSION: Positive tilt table study with significant cardioinhibitory  response.  Marland Kitchen Hyperlipidemia   . Hypertension   . Hypothyroidism   . Kidney stone   . Kidney stones    history of  . Neurocardiogenic syncope   . Palpitations   . Pericarditis 09/13/2010  . Presence of permanent cardiac pacemaker   . PSVT (paroxysmal supraventricular tachycardia) (HCC)   . Right ureteral calculus   . Sleep apnea    osa no cpap needed  . Sleeping difficulties   . SOBOE (shortness of breath on exertion)   . Stress   . Stroke (HCC) 06/15/2019   no residual deficits, tpa given at time of stroke  . Stroke (HCC)   . SVD (spontaneous vaginal delivery)    x 1  . Syncope and collapse    neurocardiogenic    Past Surgical History:  Procedure Laterality Date  .  ANTERIOR AND POSTERIOR REPAIR N/A 11/14/2017   Procedure: ANTERIOR (CYSTOCELE) AND POSTERIOR REPAIR (RECTOCELE);  Surgeon: Silverio Lay, MD;  Location: [facility];  Service: Gynecology;  Laterality: N/A;  . BILATERAL SALPINGECTOMY Bilateral 11/14/2017   Procedure: BILATERAL SALPINGECTOMY;  Surgeon: Silverio Lay, MD;  Location: [facility];  Service: Gynecology;  Laterality: Bilateral;  . BLADDER SUSPENSION Bilateral 11/14/2017   Procedure: TRANSVAGINAL TAPE (TVT) PROCEDURE;  Surgeon: Osborn Coho, MD;  Location: [facility];  Service: Gynecology;  Laterality: Bilateral;  . CHOLECYSTECTOMY  11/2007   Cholecystitis - Laparoscopic cholecystectomy with intraoperative  cholangiogram.   . CHOLECYSTECTOMY    . CYSTOSCOPY N/A 11/14/2017   Procedure: CYSTOSCOPY;  Surgeon: Osborn Coho, MD;  Location: [facility];  Service: Gynecology;  Laterality: N/A;  . CYSTOSCOPY WITH RETROGRADE PYELOGRAM, URETEROSCOPY AND STENT PLACEMENT Right 06/25/2019   Procedure: CYSTOSCOPY WITH RETROGRADE PYELOGRAM, AND RIGHT  STENT PLACEMENT;  Surgeon: Noel Christmas, MD;  Location: [facility];  Service: Urology;  Laterality: Right;  . CYSTOSCOPY WITH RETROGRADE PYELOGRAM, URETEROSCOPY AND STENT PLACEMENT Right 07/07/2019   Procedure: CYSTOSCOPY , URETEROSCOPY AND STENT REPLACEMENT;  Surgeon: Noel Christmas, MD;  Location: [facility];  Service: Urology;  Laterality: Right;  . HOLMIUM LASER APPLICATION Right 07/07/2019   Procedure: HOLMIUM LASER APPLICATION;  Surgeon: Noel Christmas, MD;  Location: [facility];  Service: Urology;  Laterality: Right;  . PACEMAKER INSERTION  2016  . Tilt Table Study  07/17/2005   Positive tilt table study with significant cardioinhibitory  response.  Marland Kitchen VAGINAL HYSTERECTOMY N/A 11/14/2017   Procedure: HYSTERECTOMY VAGINAL;  Surgeon: Silverio Lay, MD;  Location: [facility];  Service: Gynecology;  Laterality: N/A;  . WISDOM TOOTH EXTRACTION      Social History:   reports that she has never smoked. She has never used smokeless tobacco. She reports current alcohol use. She reports that she does not use drugs.  Allergies  Allergen Reactions  . Tape Hives    Adhesive tape - tegaderm/paper tape ok  . Dilaudid [Hydromorphone Hcl] Itching    "I just about crawled out of my skin"  . Prochlorperazine Other (See Comments)    Akathisia with compazine in ED on 05/17/19.  Resolved with repeat dose of benadryl.     . Quinolones Nausea Only    Family History  Problem Relation Age of Onset  . Hypertension Mother   . Heart disease Mother   . Cancer Mother        breast  . Breast cancer Mother 44  . Diabetes Mother   . Hyperlipidemia Mother   .  Thyroid disease Mother   . Depression Mother   . Anxiety disorder Mother   . Bipolar disorder Mother   . Liver disease Mother   . Alcoholism Mother   . Obesity Mother   . Heart attack Father   . Hyperlipidemia Father   . Hepatitis C Father   . Diabetes Father   . Hypertension Father   . Heart disease Father   . Sudden Cardiac Death Father   . Depression Father   . Anxiety disorder Father   . Liver disease Father   . Alcoholism Father   . Drug abuse Father   . Obesity Father   . Cancer Maternal Aunt        Breast  . Breast cancer Maternal Aunt 11  . Breast cancer Maternal Grandmother 68     Prior to Admission medications   Medication Sig Start Date End Date  Taking? Authorizing Provider  AIMOVIG 140 MG/ML SOAJ Inject 140 mg into the skin every 30 (thirty) days. 05/26/19   [provider]  aspirin 325 MG tablet Take 325 mg by mouth daily.    [provider]  atorvastatin (LIPITOR) 20 MG tablet Take 20 mg by mouth daily. 01/18/20   [provider]  diazepam (VALIUM) 5 MG tablet Take by mouth. 05/09/20   [provider]  EUTHYROX 75 MCG tablet Take 75 mcg by mouth daily. 05/15/20   [provider]  fenofibrate (TRICOR) 145 MG tablet Take 145 mg by mouth daily. 01/18/20   [provider]  ibuprofen (ADVIL) 200 MG tablet Take 800 mg by mouth every 6 (six) hours as needed for headache or mild pain.    [provider]  Magnesium 400 MG TABS Take by mouth.    [provider]  metoprolol succinate (TOPROL-XL) 50 MG 24 hr tablet Take 100 mg by mouth daily. Take with or immediately following a meal.    [provider]  omeprazole (PRILOSEC) 40 MG capsule Take 40 mg by mouth daily.    [provider]  Probiotic Product (PROBIOTIC PO) Take by mouth.    [provider]  protriptyline (VIVACTIL) 10 MG tablet Take 10 mg by mouth at bedtime. 01/18/20   [provider]  topiramate (TOPAMAX) 50  MG tablet Take 50 mg by mouth 2 (two) times daily. 01/18/20   [provider]  venlafaxine XR (EFFEXOR-XR) 150 MG 24 hr capsule Take 150 mg by mouth daily.    [provider]  zolpidem (AMBIEN) 10 MG tablet Take 10 mg by mouth at bedtime as needed. 05/09/20   [provider]    Physical Exam: Vitals:   07/24/20 2330 07/24/20 2357 07/25/20 0030 07/25/20 0045  BP:   133/85 (!) 142/97  Pulse:   73 90  Resp:   13 15  Temp:      TempSrc:      SpO2:  95% 95% 99%  Weight: 95.5 kg       Constitutional: NAD, calm  Eyes: PERTLA, lids and conjunctivae normal ENMT: Mucous membranes are moist. Posterior pharynx clear of any exudate or lesions.   Neck: normal, supple, no masses, no thyromegaly Respiratory:  no wheezing, no crackles. No accessory muscle use.  Cardiovascular: S1 & S2 heard, regular rate and rhythm. No extremity edema.   Abdomen: No distension, no tenderness, soft. Bowel sounds active.  Musculoskeletal: no clubbing / cyanosis. No joint deformity upper and lower extremities.   Skin: no significant rashes, lesions, ulcers. Warm, dry, well-perfused. Neurologic: Subtle right lower face weakness, CN II-XII grossly intact otherwise. Sensation intact. Strength 5/5 in all 4 limbs.  Psychiatric: Alert and oriented to person, place, and situation. Pleasant and cooperative.    Labs and Imaging on Admission: I have personally reviewed following labs and imaging studies  CBC: Recent Labs  Lab 07/24/20 2350 07/24/20 2351  WBC  --  8.7  NEUTROABS  --  5.0  HGB 12.6 12.9  HCT 37.0 36.1  MCV  --  88.9  PLT  --  280   Basic Metabolic Panel: Recent Labs  Lab 07/24/20 2350 07/24/20 2351  NA 139 138  K 3.2* 3.2*  CL 107 107  CO2  --  21*  GLUCOSE 93 95  BUN 12 11  CREATININE 0.80 0.87  CALCIUM  --  9.1   GFR: Estimated Creatinine Clearance: 93.5 mL/min (by C-G formula based on  SCr of 0.87 mg/dL). Liver Function Tests: Recent Labs  Lab 07/24/20 2351   AST 23  ALT 22  ALKPHOS 51  BILITOT 0.5  PROT 6.6  ALBUMIN 4.0   No results for input(s): LIPASE, AMYLASE in the last 168 hours. No results for input(s): AMMONIA in the last 168 hours. Coagulation Profile: Recent Labs  Lab 07/24/20 2351  INR 1.0   Cardiac Enzymes: No results for input(s): CKTOTAL, CKMB, CKMBINDEX, TROPONINI in the last 168 hours. BNP (last 3 results) No results for input(s): PROBNP in the last 8760 hours. HbA1C: No results for input(s): HGBA1C in the last 72 hours. CBG: Recent Labs  Lab 07/24/20 2328  GLUCAP 84   Lipid Profile: No results for input(s): CHOL, HDL, LDLCALC, TRIG, CHOLHDL, LDLDIRECT in the last 72 hours. Thyroid Function Tests: No results for input(s): TSH, T4TOTAL, FREET4, T3FREE, THYROIDAB in the last 72 hours. Anemia Panel: No results for input(s): VITAMINB12, FOLATE, FERRITIN, TIBC, IRON, RETICCTPCT in the last 72 hours. Urine analysis:    Component Value Date/Time   COLORURINE STRAW (A) 01/22/2019 2116   APPEARANCEUR HAZY (A) 01/22/2019 2116   LABSPEC <1.005 (L) 01/22/2019 2116   PHURINE 6.0 01/22/2019 2116   GLUCOSEU NEGATIVE 01/22/2019 2116   HGBUR LARGE (A) 01/22/2019 2116   BILIRUBINUR negative 06/24/2019 1105   KETONESUR negative 06/24/2019 1105   KETONESUR NEGATIVE 01/22/2019 2116   PROTEINUR negative 06/24/2019 1105   PROTEINUR NEGATIVE 01/22/2019 2116   UROBILINOGEN 0.2 06/24/2019 1105   UROBILINOGEN 0.2 02/10/2014 1052   NITRITE Negative 06/24/2019 1105   NITRITE NEGATIVE 01/22/2019 2116   LEUKOCYTESUR Negative 06/24/2019 1105   LEUKOCYTESUR NEGATIVE 01/22/2019 2116   Sepsis Labs: @LABRCNTIP (procalcitonin:4,lacticidven:4) )No results found for this or any previous visit (from the past 240 hour(s)).   Radiological Exams on Admission: CT HEAD CODE STROKE WO CONTRAST  Result Date: 07/25/2020 CLINICAL DATA:  Code stroke. Initial evaluation for acute right-sided facial numbness, speech difficulty. EXAM: CT HEAD  WITHOUT CONTRAST TECHNIQUE: Contiguous axial images were obtained from the base of the skull through the vertex without intravenous contrast. COMPARISON:  Prior study from 10/19/2019 FINDINGS: Brain: Examination mildly degraded by motion. Age-related cerebral atrophy noted. Small parenchymal calcification noted at the right occipital pole, stable. No acute intracranial hemorrhage. Ill-defined hyperdensity at the posterior left temporal occipital region favored to be related to motion. No acute large vessel territory infarct. No mass lesion, midline shift or mass effect. No hydrocephalus or extra-axial fluid collection. Vascular: No hyperdense vessel. Skull: Scalp soft tissues and calvarium within normal limits. Sinuses/Orbits: Globes and orbital soft tissues demonstrate no acute finding. Paranasal sinuses are clear. No mastoid effusion. Other: None. ASPECTS [facility] Stroke Program Early CT Score) - Ganglionic level infarction (caudate, lentiform nuclei, internal capsule, insula, M1-M3 cortex): 7 - Supraganglionic infarction (M4-M6 cortex): 3 Total score (0-10 with 10 being normal): 10 IMPRESSION: 1. Stable head CT. No acute intracranial infarct or other abnormality. 2. ASPECTS is 10. These results were communicated to Dr. [facility] at 11:48 pmon 1/30/2022by text page via the [facility] messaging system. Electronically Signed   By: [facility] M.D.   On: 07/25/2020 00:00   CT ANGIO HEAD CODE STROKE  Result Date: 07/25/2020 CLINICAL DATA:  Initial evaluation for acute stroke, right-sided facial numbness, speech deficit. EXAM: CT ANGIOGRAPHY HEAD AND NECK TECHNIQUE: Multidetector CT imaging of the head and neck was performed using the standard protocol during bolus administration of intravenous contrast. Multiplanar CT image reconstructions and MIPs were obtained to evaluate the  vascular anatomy. Carotid stenosis measurements (when applicable) are obtained utilizing NASCET criteria, using the distal internal  carotid diameter as the denominator. CONTRAST:  100mL OMNIPAQUE IOHEXOL 350 MG/ML SOLN COMPARISON:  Prior head CT from earlier the same day. FINDINGS: CTA NECK FINDINGS Aortic arch: Visualized aortic arch of normal caliber with normal branch pattern. No hemodynamically significant stenosis seen about the origin of the great vessels. Right carotid system: Right common and internal carotid arteries widely patent without stenosis, dissection or occlusion. Left carotid system: Left common and internal carotid arteries widely patent without stenosis, dissection or occlusion. Vertebral arteries: Both vertebral arteries arise from the subclavian arteries. No proximal subclavian artery stenosis. Strongly dominant left vertebral artery with a markedly hypoplastic right vertebral artery. Vertebral arteries patent within the neck without stenosis, dissection or occlusion. Skeleton: No visible acute osseous abnormality. No discrete or worrisome osseous lesions. Other neck: No other acute soft tissue abnormality within the neck. No mass or adenopathy. Upper chest: Visualized upper chest demonstrates no acute finding. Left-sided pacemaker/AICD partially visualized. Review of the MIP images confirms the above findings CTA HEAD FINDINGS Anterior circulation: Both internal carotid arteries widely patent to the termini without stenosis. A1 segments patent bilaterally. Normal anterior communicating artery complex. Anterior cerebral arteries patent to their distal aspects without stenosis. No M1 stenosis or occlusion. Normal MCA bifurcations. Distal MCA branches well perfused and symmetric. Posterior circulation: Dominant left vertebral artery widely patent to the vertebrobasilar junction. Right vertebral artery faintly visible as it courses into the cranial vault, and appears to largely terminate in PICA. Neither PICA well visualized on this exam. Basilar mildly diminutive but is patent to its distal aspect without stenosis. Superior  cerebellar arteries patent bilaterally. Left PCA supplied via a hypoplastic left P1 segment and prominent left posterior communicating artery. Fetal type origin of the right PCA. Both PCAs well perfused or distal aspects. Venous sinuses: Patent. Anatomic variants: Fetal type origin of the right PCA. Hypoplastic right vertebral artery. No aneurysm. Review of the MIP images confirms the above findings IMPRESSION: Negative CTA of the head and neck. No large vessel occlusion, hemodynamically significant stenosis, or other acute vascular abnormality. These results were communicated to Dr. Otelia LimesLindzen at 12:00 amon 1/31/2022by text page via the [facility] system. Electronically Signed   By: Rise MuBenjamin  McClintock M.D.   On: 07/25/2020 00:06   CT ANGIO NECK CODE STROKE  Result Date: 07/25/2020 CLINICAL DATA:  Initial evaluation for acute stroke, right-sided facial numbness, speech deficit. EXAM: CT ANGIOGRAPHY HEAD AND NECK TECHNIQUE: Multidetector CT imaging of the head and neck was performed using the standard protocol during bolus administration of intravenous contrast. Multiplanar CT image reconstructions and MIPs were obtained to evaluate the vascular anatomy. Carotid stenosis measurements (when applicable) are obtained utilizing NASCET criteria, using the distal internal carotid diameter as the denominator. CONTRAST:  100mL OMNIPAQUE IOHEXOL 350 MG/ML SOLN COMPARISON:  Prior head CT from earlier the same day. FINDINGS: CTA NECK FINDINGS Aortic arch: Visualized aortic arch of normal caliber with normal branch pattern. No hemodynamically significant stenosis seen about the origin of the great vessels. Right carotid system: Right common and internal carotid arteries widely patent without stenosis, dissection or occlusion. Left carotid system: Left common and internal carotid arteries widely patent without stenosis, dissection or occlusion. Vertebral arteries: Both vertebral arteries arise from the subclavian  arteries. No proximal subclavian artery stenosis. Strongly dominant left vertebral artery with a markedly hypoplastic right vertebral artery. Vertebral arteries patent within the neck without stenosis, dissection or  occlusion. Skeleton: No visible acute osseous abnormality. No discrete or worrisome osseous lesions. Other neck: No other acute soft tissue abnormality within the neck. No mass or adenopathy. Upper chest: Visualized upper chest demonstrates no acute finding. Left-sided pacemaker/AICD partially visualized. Review of the MIP images confirms the above findings CTA HEAD FINDINGS Anterior circulation: Both internal carotid arteries widely patent to the termini without stenosis. A1 segments patent bilaterally. Normal anterior communicating artery complex. Anterior cerebral arteries patent to their distal aspects without stenosis. No M1 stenosis or occlusion. Normal MCA bifurcations. Distal MCA branches well perfused and symmetric. Posterior circulation: Dominant left vertebral artery widely patent to the vertebrobasilar junction. Right vertebral artery faintly visible as it courses into the cranial vault, and appears to largely terminate in PICA. Neither PICA well visualized on this exam. Basilar mildly diminutive but is patent to its distal aspect without stenosis. Superior cerebellar arteries patent bilaterally. Left PCA supplied via a hypoplastic left P1 segment and prominent left posterior communicating artery. Fetal type origin of the right PCA. Both PCAs well perfused or distal aspects. Venous sinuses: Patent. Anatomic variants: Fetal type origin of the right PCA. Hypoplastic right vertebral artery. No aneurysm. Review of the MIP images confirms the above findings IMPRESSION: Negative CTA of the head and neck. No large vessel occlusion, hemodynamically significant stenosis, or other acute vascular abnormality. These results were communicated to Dr. Otelia Limes at 12:00 amon 1/31/2022by text page via the  [facility] messaging system. Electronically Signed   By: Rise Mu M.D.   On: 07/25/2020 00:06    EKG: Independently reviewed. Sinus rhythm.   Assessment/Plan   1. Acute right-sided weakness and speech difficulty  - Patient reports acute-onset right facial and arm weakness and difficulty speaking at 10:30 pm  - Neurology consulting and much appreciated  - No acute findings on CT head or CTA head and neck  - Deficits improving in ED  - Continue cardiac monitoring and frequent neuro checks; check MRI brain, MRA head, TTE, carotid US, A1c, and lipids; continue ASA and statin; consult PT/OT/SLP    2. Hypokalemia  - Serum potassium is 3.2 in ED  - Replace, repeat chem panel in am    3. Hypertension  - BP at goal, hold antihypertensives initially    4. Migraines  - Patient developed HA in ED, describes as less severe than her usual migraine and improved with Reglan and Benadryl in ED  - Continue topiramate     5. Depression, anxiety, insomnia   - Continue Effexor, prn Ambien, prn Valium    6. Hypothyroidism  - Continue levothyroxine    DVT prophylaxis: Lovenox  Code Status: Full  Level of Care: Level of care: Telemetry Medical Family Communication: Discussed with patient  Disposition Plan:  Patient is from: Home  Anticipated d/c is to: Home  Anticipated d/c date is: 07/26/20 Patient currently: pending MRI brain, MRA head, echo, cardiac monitoring, therapy assessments, A1c, lipid panel   Consults called: neurology  Admission status: Observation     Briscoe Deutscher, MD [facility]  07/25/2020, 1:25 AM

## 2020-07-25 NOTE — ED Notes (Signed)
pts significant other reports reaction to either reglan or compazine of feeling of bugs crawling on skin,  Pt given benadryl and reglan, reglan given over 3-4 minutes with NS running wide open. During Reglan administration began flinging legs and "not feeling right".  VSS, will continue monitor, family at bedside.

## 2020-07-25 NOTE — ED Triage Notes (Signed)
Pt arrived via [facility] from home after facial droop noted by significant other @ 2230,reports to EMS of difficulty speaking and decreased sensation to R am.  Pt ambulatory to EMS stretcher. Denies HA on arrival.  Previous stroke presentation 12/20, pt did receive TPA at that time.

## 2020-07-25 NOTE — Progress Notes (Addendum)
STROKE TEAM PROGRESS NOTE  HPI per record: Carmen [name] is an 40 y.o. female with a past history of tPA for diagnosis of acute stroke approximately one year ago (without residual deficits), anxiety, back pain, depression, migraines, heart palpitations, heartburn, chest pain, fatigue, kidney stones, seizure disorder (evaluated at [facility] in the past, not currently on an anticonvulsant and has had no spells for years), HLD, HTN, hypothyroidism, nephrolithiasis, neurocardiogenic syncope, PSVT, s/p pacemaker placement and sleep apnea, who presents via EMS as a Code Stroke after husband noted her to be dysphasic with right facial droop while they were watching TV in bed this evening. TOSO is the same as LKN: 10:30 PM. When EMS arrived, the patient endorsed lightheadedness and vertigo as well as right sided facial numbness. Home medications include ASA and atorvastatin.  LSN: 10:30 PM tPA Given: No: Relatively mild deficits. The patient declined tPA after discussion of risks/benefits.   INTERVAL HISTORY  Patient evaluated at bedside in the morning, no family present in the room.  Patient states she feels back to her baseline now.  She states she had right facial droop yesterday evening and facial numbness which is back to baseline now.  Noted right arm pain, numbness but does not note anything this morning.  Denies any pain or numbness in right leg.  CT head does not show any abnormality.  MRI also does not reveal any acute abnormality.  Possibly TIA, patient was noncompliant with aspirin from last whole month.  Plan is to do DAPT for 3 weeks and then aspirin 81 mg alone.  Patient had a stroke in 2020 and she was found to have mild sleep apnea.  .  She is advised to follow-up with [facility] to help her set up with her needs and she shows good understanding about it.  Blood pressure well controlled.  Neurologically patient is back at baseline and from stroke team point of view patient is stable.  Stroke  team is signing off and Dr. Antionette Char is updated about the patient's current situation.  Vitals:   07/25/20 1030 07/25/20 1048 07/25/20 1338 07/25/20 1539  BP: (!) 131/102  126/87 (!) 144/101  Pulse: 79  84 84  Resp: (!) 23  20 18   Temp:  99 F (37.2 C) 99.1 F (37.3 C) 98.6 F (37 C)  TempSrc:  Oral Oral Oral  SpO2: 96%  97% 99%  Weight:       CBC:  Recent Labs  Lab 07/24/20 2351 07/25/20 0405  WBC 8.7 6.7  NEUTROABS 5.0  --   HGB 12.9 11.9*  HCT 36.1 35.3*  MCV 88.9 90.3  PLT 280 270   Basic Metabolic Panel:  Recent Labs  Lab 07/24/20 2350 07/24/20 2351 07/25/20 0405  NA 139 138  --   K 3.2* 3.2*  --   CL 107 107  --   CO2  --  21*  --   GLUCOSE 93 95  --   BUN 12 11  --   CREATININE 0.80 0.87  --   CALCIUM  --  9.1  --   MG  --   --  1.9   Lipid Panel:  Recent Labs  Lab 07/25/20 0405  CHOL 220*  TRIG 181*  HDL 33*  CHOLHDL 6.7  VLDL 36  LDLCALC 07/27/20*   HgbA1c:  Recent Labs  Lab 07/25/20 0406  HGBA1C 5.1   Urine Drug Screen:  Recent Labs  Lab 07/25/20 0721  LABOPIA NONE DETECTED  COCAINSCRNUR  NONE DETECTED  LABBENZ POSITIVE*  AMPHETMU NONE DETECTED  THCU NONE DETECTED  LABBARB NONE DETECTED     IMAGING past 24 hours  CT Head code stroke 07/25/2020  IMPRESSION: 1. Stable head CT. No acute intracranial infarct or other abnormality. 2. ASPECTS is 10.  CT Angio Head Neck Code stroke 07/25/2020  IMPRESSION: Negative CTA of the head and neck. No large vessel occlusion, hemodynamically significant stenosis, or other acute vascular Abnormality.  MRI Brain MRA Head 07/25/2020  IMPRESSION: 1. Normal MRI of the brain. 2. Normal MRA of the head.   Echocardiogram 07/25/2020  IMPRESSIONS    1. Left ventricular ejection fraction, by estimation, is 60 to 65%. The  left ventricle has normal function. The left ventricle has no regional  wall motion abnormalities. Left ventricular diastolic parameters were  normal.  2. Right  ventricular systolic function is normal. The right ventricular  size is normal. There is normal pulmonary artery systolic pressure.  3. The mitral valve is normal in structure. Trivial mitral valve  regurgitation. No evidence of mitral stenosis.  4. The aortic valve is normal in structure. Aortic valve regurgitation is  not visualized. No aortic stenosis is present.  5. The inferior vena cava is normal in size with greater than 50%  respiratory variability, suggesting right atrial pressure of 3 mmHg.      PHYSICAL EXAM  General: Mildly obese Caucasian lady lying comfortably in bed, NAD HEENT: [place]/AT, PERRLA, MMM Cardiovascular: Regular rate and rhythm Respiratory: No respiratory distress Extremities: No peripheral edema noted Psych: Normal mood and affect Neurological:   Mental Status: Alert, oriented, thought content appropriate.  Speech fluent without evidence of aphasia.  Able to follow 3 step commands without difficulty. Cranial Nerves: II:  Visual fields grossly normal, pupils equal, round, reactive to light and accommodation III,IV, VI: ptosis not present, extra-ocular motions intact bilaterally V,VII: smile symmetric when not examined, forced and asymmetric when following commands, facial light touch sensation normal bilaterally VIII: hearing normal bilaterally IX,X: uvula rises symmetrically XI: bilateral shoulder shrug XII: midline tongue extension without atrophy or fasciculations  Motor: Right : Upper extremity   5/5    Left:     Upper extremity   5/5  Lower extremity   5/5     Lower extremity   5/5 Tone and bulk:normal tone throughout; no atrophy noted Sensory: Pinprick and light touch intact throughout, bilaterally Deep Tendon Reflexes:  Right: Upper Extremity   Left: Upper extremity   biceps (C-5 to C-6) 2/4   biceps (C-5 to C-6) 2/4 tricep (C7) 2/4    triceps (C7) 2/4 Brachioradialis (C6) 2/4  Brachioradialis (C6) 2/4  Lower Extremity Lower Extremity   quadriceps (L-2 to L-4) 2/4   quadriceps (L-2 to L-4) 2/4 Achilles (S1) 2/4   Achilles (S1) 2/4  Plantars: Right: downgoing   Left: downgoing Cerebellar: normal finger-to-nose,  normal heel-to-shin test Gait: Deferred   ASSESSMENT/PLAN Ms. Carmen [name] is a 40 y.o. female with PMHx of stroke approximately one year ago (without residual deficits) s/p tPA, anxiety, back pain, depression, migraines, heart palpitations, heartburn, chest pain, fatigue, kidney stones, seizure disorder (evaluated at [facility] in the past, not currently on an anticonvulsant and has had no spells for years), HLD, HTN, hypothyroidism, nephrolithiasis, neurocardiogenic syncope, PSVT, s/p pacemaker placement and sleep apnea, who presents via EMS as a Code Stroke after husband noted her to be dysphasic with right facial droop while they were watching TV in bed this evening. TOSO is the same  as LKN: 10:30 PM. When EMS arrived, the patient endorsed lightheadedness and vertigo as well as right sided facial numbness.  CT head does not show any acute abnormality.  MRI does not show any acute abnormality.  Transient ischemic attack, medication noncompliance of aspirin from last 1 month.  Code Stroke CT head : No acute abnormality. ASPECTS 10.   CTA head & neck:  No large vessel occlusion, hemodynamically significant stenosis, or other acute vascular abnormality.  MRI : Normal MRI of the brain.  MRA  Normal MRA of the brain.  2D Echo: Left ventricular ejection fraction, by estimation, is 60 to 65%.  LDL 151  HgbA1c 5.1  VTE prophylaxis -Lovenox 40 mg    Diet   Diet Heart Room service appropriate? Yes; Fluid consistency: Thin   aspirin 325 mg daily prior to admission, now on aspirin 81 mg daily and clopidogrel 75 mg daily.  Continue DAPT for 3 weeks and then aspirin 81 mg alone.  Therapy recommendations: None  Disposition: Likely home  Hypertension  Home meds: Metoprolol 50 mg  Stable . Permissive hypertension  (OK if < 220/120) but gradually normalize in 5-7 days . Long-term BP goal normotensive  Hyperlipidemia  Home meds: Lipitor 20 mg  Start Lipitor 80 mg  LDL 151, goal < 70  Continue statin at discharge  Diabetes type II Controlled  Home meds: None  HgbA1c 5.1, goal < 7.0  CBGs Recent Labs    07/24/20 2328  GLUCAP 84      SSI  Migraine   Home meds: Topiramate, sumatriptan  Restarted topiramate 50 mg  Continue at discharge  Sleep apnea   Advised to follow-up as an outpatient with [facility]   Other Stroke Risk Factors  Obesity, Body mass index is 37.3 kg/m., BMI >/= 30 associated with increased stroke risk, recommend weight loss, diet and exercise as appropriate   Hx stroke/TIA  Other Active Problems  Depression: Effexor  Anxiety: Ambien  Insomnia: Valium  Hypothyroidism: Levothyroxine  Hypokalemia: 3.2-supplented  Hospital day # 0  I have personally obtained history,examined this patient, reviewed notes, independently viewed imaging studies, participated in medical decision making and plan of care.ROS completed by me personally and pertinent positives fully documented  I have made any additions or clarifications directly to the above note. Agree with note above.  She presented with transient right facial droop and speech difficulties which appear to have resolved.  Her neurological exam presently is nonfocal.  MRI scan is negative for acute stroke.  Patient was previously seen in 2020 with 2 small stroke in was asked to be on aspirin and statin but has stopped both medications.  Recommend aspirin 81 and Plavix 75 mg daily for 3 weeks followed by aspirin alone and aggressive risk factor modification.  Resume statin.  Follow-up as an outpatient stroke clinic and with [facility] for his sleep apnea.  Greater than 50% time during this 25-minute visit was spent on counseling and coordination of care per TIA in discussion with education need  for compliance and answering questions.  Discussed with Dr. Meade Maw, MD Medical Director [facility] Pager: [phone] 07/25/2020 4:04 PM   To contact Stroke Continuity provider, please refer to [email or web address]. After hours, contact General Neurology

## 2020-07-25 NOTE — Progress Notes (Signed)
PT Cancellation Note  Patient Details Name: Carmen [name] MRN: [id number] DOB: 1981-06-10   Cancelled Treatment:    Reason Eval/Treat Not Completed: PT screened, no needs identified, will sign off Per OT, no skilled PT needs and pt independent. Will sign off. If needs change, please re-consult.   Cindee Salt, DPT  Acute Rehabilitation Services  Pager: [phone] Office: [phone]    Carmen [name] 07/25/2020, 9:43 AM

## 2020-07-25 NOTE — ED Notes (Signed)
Breakfast ordered 

## 2020-07-25 NOTE — ED Notes (Signed)
Attempted report x1. 

## 2020-07-25 NOTE — ED Notes (Signed)
Dr. Bebe Shaggy at bedside, pt now c/o HA, MD made aware.

## 2020-07-25 NOTE — Discharge Summary (Signed)
Physician Discharge Summary  Carmen [name] ZOX:[id number] DOB: October 15, 1980 DOA: 07/24/2020  PCP: Marthenia Rolling, MD  Admit date: 07/24/2020 Discharge date: 07/25/2020  Admitted From: Home Disposition:  Home  Recommendations for Outpatient Follow-up:  1. Follow up with PCP in 1-2 weeks 2. Please obtain BMP/CBC in one week 3. Please follow up on the following pending results:  Home Health: None  Equipment/Devices:Nonee  Discharge Condition: Stable  CODE STATUS:Full  Brief/Interim Summary: Carmen [name] a 40 y.o.femalewith medical history significant fordepression, anxiety, migraines, bradycardia with dual-chamber pacemaker, and history of strokelike episode 1 year ago, now presenting to the emergency department with acute onset right-sided weakness and speech difficulty. Patient reports that she was in her usual state until 10:30 PM when she had difficulty speaking. She reports experiencing some weakness on her right side, mainly the right arm. Her husband observed a right facial droop. Patient reports some decreased sensation on the right side but mainly noticed the weakness in her right arm. She did not feel that the leg was numb a week. She was not having a headache at the time but later developed onein the ED that she describes as less severe than her usual migraine. She denies any recent fevers, chills, cough, shortness of breath, chest pain, or palpitations.Upon arrival to the ED, patient is found to be afebrile, saturating well on room air, and with stable blood pressure. EKG features a sinus rhythm. Noncontrast head CT negative for acute intracranial abnormality and CTA head and neck were negative. Chemistry panel notable for potassium 3.2 and CBC unremarkable. COVID-19 screening test is pending. Patient was treated with Reglan and Benadryl in the ED. Neurology is consulting. Hospitalistswere asked to admit.  Patient placed in observation overnight due to symptoms of  weakness that resolved quickly. Neuro consulted and given negative imaging they recommend no further workup/evaluation in the inpatient setting at this time. Recommend DAPT with 81mg  asa and plavix for 3 weeks then aspirin alone, increase in statin as well to 80mg  lipitor. Otherwise patient stable and agreeable for discharge. Close follow up with PCP and neurology as scheduled.  Discharge Diagnoses:  Principal Problem:   Acute right-sided weakness Active Problems:   Hyperlipidemia   Essential hypertension   Hypothyroidism   Migraine   Depression with anxiety   Hypokalemia    Discharge Instructions  Discharge Instructions    Diet - low sodium heart healthy   Complete by: As directed    Increase activity slowly   Complete by: As directed      Allergies as of 07/25/2020      Reactions   Tape Hives   Adhesive tape - tegaderm/paper tape ok   Dilaudid [hydromorphone Hcl] Itching   "I just about crawled out of my skin"   Prochlorperazine Other (See Comments)   Akathisia with compazine in ED on 05/17/19.  Resolved with repeat dose of benadryl.      Quinolones Nausea Only   Reglan [metoclopramide] Other (See Comments)   flinging legs and "not feeling right".       Medication List    STOP taking these medications   aspirin 325 MG tablet Replaced by: aspirin 81 MG EC tablet     TAKE these medications   Aimovig 140 MG/ML Soaj Generic drug: Erenumab-aooe Inject 140 mg into the skin every 30 (thirty) days.   aspirin 81 MG EC tablet Take 1 tablet (81 mg total) by mouth daily. Swallow whole. Start taking on: July 26, 2020 Replaces: aspirin 325 MG  tablet   atorvastatin 80 MG tablet Commonly known as: LIPITOR Take 1 tablet (80 mg total) by mouth daily at 6 PM. Start taking on: July 26, 2020 What changed:   medication strength  how much to take  when to take this   clopidogrel 75 MG tablet Commonly known as: PLAVIX Take 1 tablet (75 mg total) by mouth  daily. Start taking on: July 26, 2020   diazepam 5 MG tablet Commonly known as: VALIUM Take 5 mg by mouth every 12 (twelve) hours as needed for anxiety.   Euthyrox 75 MCG tablet Generic drug: levothyroxine Take 75 mcg by mouth daily.   fenofibrate 145 MG tablet Commonly known as: TRICOR Take 145 mg by mouth daily.   Magnesium 400 MG Tabs Take 400 mg by mouth daily.   metoprolol succinate 50 MG 24 hr tablet Commonly known as: TOPROL-XL Take 100 mg by mouth daily. Take with or immediately following a meal.   omeprazole 40 MG capsule Commonly known as: PRILOSEC Take 40 mg by mouth daily.   PROBIOTIC PO Take 1 capsule by mouth daily.   protriptyline 10 MG tablet Commonly known as: VIVACTIL Take 10 mg by mouth at bedtime.   SUMAtriptan 100 MG tablet Commonly known as: IMITREX Take 100 mg by mouth every 2 (two) hours as needed for migraine. May repeat in 2 hours if headache persists or recurs.   topiramate 50 MG tablet Commonly known as: TOPAMAX Take 50 mg by mouth 2 (two) times daily.   venlafaxine XR 150 MG 24 hr capsule Commonly known as: EFFEXOR-XR Take 150 mg by mouth daily.   zolpidem 10 MG tablet Commonly known as: AMBIEN Take 10 mg by mouth every other day.       Follow-up Information    [facility] Follow up in 6 week(s).   Specialty: Neurology Contact information: [address] [place] [address] [phone]       [facility] Follow up in 1 week(s).   Specialty: Sleep Medicine Contact information: [address] [place] [address] [phone]             Allergies  Allergen Reactions  . Tape Hives    Adhesive tape - tegaderm/paper tape ok  . Dilaudid [Hydromorphone Hcl] Itching    "I just about crawled out of my skin"  . Prochlorperazine Other (See Comments)    Akathisia with compazine in ED on 05/17/19.  Resolved  with repeat dose of benadryl.     . Quinolones Nausea Only  . Reglan [Metoclopramide] Other (See Comments)    flinging legs and "not feeling right".     Consultations: Neuro  Procedures/Studies: MR ANGIO HEAD WO CONTRAST  Result Date: 07/25/2020 CLINICAL DATA:  Neuro deficit, acute, stroke suspected. EXAM: MRI HEAD WITHOUT CONTRAST MRA HEAD WITHOUT CONTRAST TECHNIQUE: Multiplanar, multiecho pulse sequences of the brain and surrounding structures were obtained without intravenous contrast. Angiographic images of the head were obtained using MRA technique without contrast. COMPARISON:  None. FINDINGS: MRI HEAD FINDINGS Brain: No acute infarction, hemorrhage, hydrocephalus, extra-axial collection or mass lesion. The brain parenchyma has normal morphology and signal characteristics. Vascular: Normal flow voids. Skull and upper cervical spine: Normal marrow signal. Sinuses/Orbits: Negative. MRA HEAD FINDINGS The visualized portions of the distal cervical and intracranial internal carotid arteries are widely patent with normal flow related enhancement. The bilateral anterior cerebral arteries and middle cerebral arteries are widely patent with antegrade flow  without high-grade flow-limiting stenosis or proximal branch occlusion. No intracranial aneurysm within the anterior circulation. The vertebral arteries are widely patent with antegrade flow. Vertebrobasilar junction and basilar artery are widely patent with antegrade flow without evidence of basilar stenosis or aneurysm. The right posterior cerebral artery origin is directly from the right ICA (fetal PCA). Posterior cerebral arteries are normal bilaterally. No intracranial aneurysm within the posterior circulation. IMPRESSION: 1. Normal MRI of the brain. 2. Normal MRA of the head. Electronically Signed   By: Baldemar Lenis M.D.   On: 07/25/2020 15:07   MR BRAIN WO CONTRAST  Result Date: 07/25/2020 CLINICAL DATA:  Neuro deficit, acute,  stroke suspected. EXAM: MRI HEAD WITHOUT CONTRAST MRA HEAD WITHOUT CONTRAST TECHNIQUE: Multiplanar, multiecho pulse sequences of the brain and surrounding structures were obtained without intravenous contrast. Angiographic images of the head were obtained using MRA technique without contrast. COMPARISON:  None. FINDINGS: MRI HEAD FINDINGS Brain: No acute infarction, hemorrhage, hydrocephalus, extra-axial collection or mass lesion. The brain parenchyma has normal morphology and signal characteristics. Vascular: Normal flow voids. Skull and upper cervical spine: Normal marrow signal. Sinuses/Orbits: Negative. MRA HEAD FINDINGS The visualized portions of the distal cervical and intracranial internal carotid arteries are widely patent with normal flow related enhancement. The bilateral anterior cerebral arteries and middle cerebral arteries are widely patent with antegrade flow without high-grade flow-limiting stenosis or proximal branch occlusion. No intracranial aneurysm within the anterior circulation. The vertebral arteries are widely patent with antegrade flow. Vertebrobasilar junction and basilar artery are widely patent with antegrade flow without evidence of basilar stenosis or aneurysm. The right posterior cerebral artery origin is directly from the right ICA (fetal PCA). Posterior cerebral arteries are normal bilaterally. No intracranial aneurysm within the posterior circulation. IMPRESSION: 1. Normal MRI of the brain. 2. Normal MRA of the head. Electronically Signed   By: Baldemar Lenis M.D.   On: 07/25/2020 15:07   ECHOCARDIOGRAM COMPLETE  Result Date: 07/25/2020    ECHOCARDIOGRAM REPORT   Patient Name:   Carmen [name] Date of Exam: 07/25/2020 Medical Rec #:  [id number]     Height:       63.0 in Accession #:    [id number]    Weight:       210.5 lb Date of Birth:  22-Oct-1980     BSA:          1.976 m Patient Age:    39 years      BP:           118/70 mmHg Patient Gender: F             HR:            71 bpm. Exam Location:  Inpatient Procedure: 2D Echo, Color Doppler and Cardiac Doppler Indications:    TIA 435.9 / G45.9  History:        Patient has prior history of Echocardiogram examinations, most                 recent 06/16/2019. Stroke; Risk Factors:Hypertension and                 Dyslipidemia.  Sonographer:    Eulah Pont RDCS Referring Phys: [id number] TIMOTHY S OPYD IMPRESSIONS  1. Left ventricular ejection fraction, by estimation, is 60 to 65%. The left ventricle has normal function. The left ventricle has no regional wall motion abnormalities. Left ventricular diastolic parameters were normal.  2. Right ventricular systolic function is normal.  The right ventricular size is normal. There is normal pulmonary artery systolic pressure.  3. The mitral valve is normal in structure. Trivial mitral valve regurgitation. No evidence of mitral stenosis.  4. The aortic valve is normal in structure. Aortic valve regurgitation is not visualized. No aortic stenosis is present.  5. The inferior vena cava is normal in size with greater than 50% respiratory variability, suggesting right atrial pressure of 3 mmHg. FINDINGS  Left Ventricle: Left ventricular ejection fraction, by estimation, is 60 to 65%. The left ventricle has normal function. The left ventricle has no regional wall motion abnormalities. The left ventricular internal cavity size was normal in size. There is  no left ventricular hypertrophy. Left ventricular diastolic parameters were normal. Normal left ventricular filling pressure. Right Ventricle: The right ventricular size is normal. No increase in right ventricular wall thickness. Right ventricular systolic function is normal. There is normal pulmonary artery systolic pressure. The tricuspid regurgitant velocity is 2.23 m/s, and  with an assumed right atrial pressure of 3 mmHg, the estimated right ventricular systolic pressure is 22.9 mmHg. Left Atrium: Left atrial size was normal in size. Right  Atrium: Right atrial size was normal in size. Pericardium: There is no evidence of pericardial effusion. Mitral Valve: The mitral valve is normal in structure. Trivial mitral valve regurgitation. No evidence of mitral valve stenosis. Tricuspid Valve: The tricuspid valve is normal in structure. Tricuspid valve regurgitation is trivial. No evidence of tricuspid stenosis. Aortic Valve: The aortic valve is normal in structure. Aortic valve regurgitation is not visualized. No aortic stenosis is present. Pulmonic Valve: The pulmonic valve was normal in structure. Pulmonic valve regurgitation is not visualized. No evidence of pulmonic stenosis. Aorta: The aortic root is normal in size and structure. Venous: The inferior vena cava is normal in size with greater than 50% respiratory variability, suggesting right atrial pressure of 3 mmHg. IAS/Shunts: No atrial level shunt detected by color flow Doppler. Additional Comments: A pacer wire is visualized.  LEFT VENTRICLE PLAX 2D LVIDd:         5.20 cm  Diastology LVIDs:         2.30 cm  LV e' medial:    9.14 cm/s LV PW:         0.60 cm  LV E/e' medial:  12.0 LV IVS:        0.70 cm  LV e' lateral:   9.36 cm/s LVOT diam:     1.80 cm  LV E/e' lateral: 11.8 LV SV:         74 LV SV Index:   38 LVOT Area:     2.54 cm  RIGHT VENTRICLE RV S prime:     12.10 cm/s TAPSE (M-mode): 2.6 cm LEFT ATRIUM             Index       RIGHT ATRIUM           Index LA diam:        3.80 cm 1.92 cm/m  RA Area:     14.50 cm LA Vol (A2C):   31.2 ml 15.79 ml/m RA Volume:   34.10 ml  17.25 ml/m LA Vol (A4C):   35.5 ml 17.96 ml/m LA Biplane Vol: 34.3 ml 17.36 ml/m  AORTIC VALVE LVOT Vmax:   124.00 cm/s LVOT Vmean:  87.700 cm/s LVOT VTI:    0.292 m  AORTA Ao Root diam: 3.10 cm Ao Asc diam:  3.00 cm MITRAL VALVE  TRICUSPID VALVE MV Area (PHT): 3.12 cm     TR Peak grad:   19.9 mmHg MV Decel Time: 243 msec     TR Vmax:        223.00 cm/s MV E velocity: 110.00 cm/s MV A velocity: 69.40 cm/s    SHUNTS MV E/A ratio:  1.59         Systemic VTI:  0.29 m                             Systemic Diam: 1.80 cm Armanda Magic MD Electronically signed by Armanda Magic MD Signature Date/Time: 07/25/2020/9:12:19 AM    Final    CT HEAD CODE STROKE WO CONTRAST  Result Date: 07/25/2020 CLINICAL DATA:  Code stroke. Initial evaluation for acute right-sided facial numbness, speech difficulty. EXAM: CT HEAD WITHOUT CONTRAST TECHNIQUE: Contiguous axial images were obtained from the base of the skull through the vertex without intravenous contrast. COMPARISON:  Prior study from 10/19/2019 FINDINGS: Brain: Examination mildly degraded by motion. Age-related cerebral atrophy noted. Small parenchymal calcification noted at the right occipital pole, stable. No acute intracranial hemorrhage. Ill-defined hyperdensity at the posterior left temporal occipital region favored to be related to motion. No acute large vessel territory infarct. No mass lesion, midline shift or mass effect. No hydrocephalus or extra-axial fluid collection. Vascular: No hyperdense vessel. Skull: Scalp soft tissues and calvarium within normal limits. Sinuses/Orbits: Globes and orbital soft tissues demonstrate no acute finding. Paranasal sinuses are clear. No mastoid effusion. Other: None. ASPECTS [facility] Stroke Program Early CT Score) - Ganglionic level infarction (caudate, lentiform nuclei, internal capsule, insula, M1-M3 cortex): 7 - Supraganglionic infarction (M4-M6 cortex): 3 Total score (0-10 with 10 being normal): 10 IMPRESSION: 1. Stable head CT. No acute intracranial infarct or other abnormality. 2. ASPECTS is 10. These results were communicated to Dr. Otelia Limes at 11:48 pmon 1/30/2022by text page via the [facility] messaging system. Electronically Signed   By: Rise Mu M.D.   On: 07/25/2020 00:00   CT ANGIO HEAD CODE STROKE  Result Date: 07/25/2020 CLINICAL DATA:  Initial evaluation for acute stroke, right-sided facial numbness, speech deficit.  EXAM: CT ANGIOGRAPHY HEAD AND NECK TECHNIQUE: Multidetector CT imaging of the head and neck was performed using the standard protocol during bolus administration of intravenous contrast. Multiplanar CT image reconstructions and MIPs were obtained to evaluate the vascular anatomy. Carotid stenosis measurements (when applicable) are obtained utilizing NASCET criteria, using the distal internal carotid diameter as the denominator. CONTRAST:  OMNIPAQUE IOHEXOL 350 MG/ML SOLN COMPARISON:  Prior head CT from earlier the same day. FINDINGS: CTA NECK FINDINGS Aortic arch: Visualized aortic arch of normal caliber with normal branch pattern. No hemodynamically significant stenosis seen about the origin of the great vessels. Right carotid system: Right common and internal carotid arteries widely patent without stenosis, dissection or occlusion. Left carotid system: Left common and internal carotid arteries widely patent without stenosis, dissection or occlusion. Vertebral arteries: Both vertebral arteries arise from the subclavian arteries. No proximal subclavian artery stenosis. Strongly dominant left vertebral artery with a markedly hypoplastic right vertebral artery. Vertebral arteries patent within the neck without stenosis, dissection or occlusion. Skeleton: No visible acute osseous abnormality. No discrete or worrisome osseous lesions. Other neck: No other acute soft tissue abnormality within the neck. No mass or adenopathy. Upper chest: Visualized upper chest demonstrates no acute finding. Left-sided pacemaker/AICD partially visualized. Review of the MIP images confirms the above findings CTA HEAD  FINDINGS Anterior circulation: Both internal carotid arteries widely patent to the termini without stenosis. A1 segments patent bilaterally. Normal anterior communicating artery complex. Anterior cerebral arteries patent to their distal aspects without stenosis. No M1 stenosis or occlusion. Normal MCA bifurcations.  Distal MCA branches well perfused and symmetric. Posterior circulation: Dominant left vertebral artery widely patent to the vertebrobasilar junction. Right vertebral artery faintly visible as it courses into the cranial vault, and appears to largely terminate in PICA. Neither PICA well visualized on this exam. Basilar mildly diminutive but is patent to its distal aspect without stenosis. Superior cerebellar arteries patent bilaterally. Left PCA supplied via a hypoplastic left P1 segment and prominent left posterior communicating artery. Fetal type origin of the right PCA. Both PCAs well perfused or distal aspects. Venous sinuses: Patent. Anatomic variants: Fetal type origin of the right PCA. Hypoplastic right vertebral artery. No aneurysm. Review of the MIP images confirms the above findings IMPRESSION: Negative CTA of the head and neck. No large vessel occlusion, hemodynamically significant stenosis, or other acute vascular abnormality. These results were communicated to Dr. Otelia Limes at 12:00 amon 1/31/2022by text page via the [place] messaging system. Electronically Signed   By: Rise Mu M.D.   On: 07/25/2020 00:06   CT ANGIO NECK CODE STROKE  Result Date: 07/25/2020 CLINICAL DATA:  Initial evaluation for acute stroke, right-sided facial numbness, speech deficit. EXAM: CT ANGIOGRAPHY HEAD AND NECK TECHNIQUE: Multidetector CT imaging of the head and neck was performed using the standard protocol during bolus administration of intravenous contrast. Multiplanar CT image reconstructions and MIPs were obtained to evaluate the vascular anatomy. Carotid stenosis measurements (when applicable) are obtained utilizing NASCET criteria, using the distal internal carotid diameter as the denominator. CONTRAST:  OMNIPAQUE IOHEXOL 350 MG/ML SOLN COMPARISON:  Prior head CT from earlier the same day. FINDINGS: CTA NECK FINDINGS Aortic arch: Visualized aortic arch of normal caliber with normal branch pattern. No  hemodynamically significant stenosis seen about the origin of the great vessels. Right carotid system: Right common and internal carotid arteries widely patent without stenosis, dissection or occlusion. Left carotid system: Left common and internal carotid arteries widely patent without stenosis, dissection or occlusion. Vertebral arteries: Both vertebral arteries arise from the subclavian arteries. No proximal subclavian artery stenosis. Strongly dominant left vertebral artery with a markedly hypoplastic right vertebral artery. Vertebral arteries patent within the neck without stenosis, dissection or occlusion. Skeleton: No visible acute osseous abnormality. No discrete or worrisome osseous lesions. Other neck: No other acute soft tissue abnormality within the neck. No mass or adenopathy. Upper chest: Visualized upper chest demonstrates no acute finding. Left-sided pacemaker/AICD partially visualized. Review of the MIP images confirms the above findings CTA HEAD FINDINGS Anterior circulation: Both internal carotid arteries widely patent to the termini without stenosis. A1 segments patent bilaterally. Normal anterior communicating artery complex. Anterior cerebral arteries patent to their distal aspects without stenosis. No M1 stenosis or occlusion. Normal MCA bifurcations. Distal MCA branches well perfused and symmetric. Posterior circulation: Dominant left vertebral artery widely patent to the vertebrobasilar junction. Right vertebral artery faintly visible as it courses into the cranial vault, and appears to largely terminate in PICA. Neither PICA well visualized on this exam. Basilar mildly diminutive but is patent to its distal aspect without stenosis. Superior cerebellar arteries patent bilaterally. Left PCA supplied via a hypoplastic left P1 segment and prominent left posterior communicating artery. Fetal type origin of the right PCA. Both PCAs well perfused or distal aspects. Venous sinuses: Patent. Anatomic  variants: Fetal type origin of the right PCA. Hypoplastic right vertebral artery. No aneurysm. Review of the MIP images confirms the above findings IMPRESSION: Negative CTA of the head and neck. No large vessel occlusion, hemodynamically significant stenosis, or other acute vascular abnormality. These results were communicated to Dr. Otelia LimesLindzen at 12:00 amon 1/31/2022by text page via the [facility] messaging system. Electronically Signed   By: Rise MuBenjamin  McClintock M.D.   On: 07/25/2020 00:06     Subjective: No acute issues/events overnight   Discharge Exam: Vitals:   07/25/20 1646 07/25/20 1913  BP: (!) 158/104 133/78  Pulse: 86 100  Resp: 18 18  Temp: 98.2 F (36.8 C) 99 F (37.2 C)  SpO2: 98% 98%   Vitals:   07/25/20 1338 07/25/20 1539 07/25/20 1646 07/25/20 1913  BP: 126/87 (!) 144/101 (!) 158/104 133/78  Pulse: 84 84 86 100  Resp: 20 18 18 18   Temp: 99.1 F (37.3 C) 98.6 F (37 C) 98.2 F (36.8 C) 99 F (37.2 C)  TempSrc: Oral Oral Oral Oral  SpO2: 97% 99% 98% 98%  Weight:        General: Pt is alert, awake, not in acute distress Cardiovascular: RRR, S1/S2 +, no rubs, no gallops Respiratory: CTA bilaterally, no wheezing, no rhonchi Abdominal: Soft, NT, ND, bowel sounds + Extremities: no edema, no cyanosis    The results of significant diagnostics from this hospitalization (including imaging, microbiology, ancillary and laboratory) are listed below for reference.     Microbiology: Recent Results (from the past 240 hour(s))  SARS Coronavirus 2 by RT PCR (hospital order[facility] Nasopharyngeal Nasopharyngeal Swab     Status: None   Collection Time: 07/25/20 12:40 AM   Specimen: Nasopharyngeal Swab  Result Value Ref Range Status   SARS Coronavirus 2 NEGATIVE NEGATIVE Final    Comment: (NOTE) SARS-CoV-2 target nucleic acids are NOT DETECTED.  The SARS-CoV-2 RNA is generally detectable in upper and lower respiratory specimens during the  acute phase of infection. The lowest concentration of SARS-CoV-2 viral copies this assay can detect is 250 copies / mL. A negative result does not preclude SARS-CoV-2 infection and should not be used as the sole basis for treatment or other patient management decisions.  A negative result may occur with improper specimen collection / handling, submission of specimen other than nasopharyngeal swab, presence of viral mutation(s) within the areas targeted by this assay, and inadequate number of viral copies (<250 copies / mL). A negative result must be combined with clinical observations, patient history, and epidemiological information.  Fact Sheet for Patients:   BoilerBrush.com.cyhttps://www.fda.gov/media/136312/download  Fact Sheet for Healthcare Providers: https://pope.com/https://www.fda.gov/media/136313/download  This test is not yet approved or  cleared by the [place] FDA and has been authorized for detection and/or diagnosis of SARS-CoV-2 by FDA under an Emergency Use Authorization (EUA).  This EUA will remain in effect (meaning this test can be used) for the duration of the COVID-19 declaration under Section 564(b)(1) of the Act, 21 U.S.C. section 360bbb-3(b)(1), unless the authorization is terminated or revoked sooner.  Performed at [facility], [address] [place], [place] [address]      Labs: BNP (last 3 results) No results for input(s): BNP in the last 8760 hours. Basic Metabolic Panel: Recent Labs  Lab 07/24/20 2350 07/24/20 2351 07/25/20 0405  NA 139 138  --   K 3.2* 3.2*  --   CL 107 107  --   CO2  --  21*  --  GLUCOSE 93 95  --   BUN 12 11  --   CREATININE 0.80 0.87  --   CALCIUM  --  9.1  --   MG  --   --  1.9   Liver Function Tests: Recent Labs  Lab 07/24/20 2351  AST 23  ALT 22  ALKPHOS 51  BILITOT 0.5  PROT 6.6  ALBUMIN 4.0   No results for input(s): LIPASE, AMYLASE in the last 168 hours. No results for input(s): AMMONIA in the last 168  hours. CBC: Recent Labs  Lab 07/24/20 2350 07/24/20 2351 07/25/20 0405  WBC  --  8.7 6.7  NEUTROABS  --  5.0  --   HGB 12.6 12.9 11.9*  HCT 37.0 36.1 35.3*  MCV  --  88.9 90.3  PLT  --  280 270   Cardiac Enzymes: No results for input(s): CKTOTAL, CKMB, CKMBINDEX, TROPONINI in the last 168 hours. BNP: Invalid input(s): POCBNP CBG: Recent Labs  Lab 07/24/20 2328  GLUCAP 84   D-Dimer No results for input(s): DDIMER in the last 72 hours. Hgb A1c Recent Labs    07/25/20 0406  HGBA1C 5.1   Lipid Profile Recent Labs    07/25/20 0405  CHOL 220*  HDL 33*  LDLCALC 151*  TRIG 181*  CHOLHDL 6.7   Thyroid function studies No results for input(s): TSH, T4TOTAL, T3FREE, THYROIDAB in the last 72 hours.  Invalid input(s): FREET3 Anemia work up No results for input(s): VITAMINB12, FOLATE, FERRITIN, TIBC, IRON, RETICCTPCT in the last 72 hours. Urinalysis    Component Value Date/Time   COLORURINE STRAW (A) 01/22/2019 2116   APPEARANCEUR HAZY (A) 01/22/2019 2116   LABSPEC <1.005 (L) 01/22/2019 2116   PHURINE 6.0 01/22/2019 2116   GLUCOSEU NEGATIVE 01/22/2019 2116   HGBUR LARGE (A) 01/22/2019 2116   BILIRUBINUR negative 06/24/2019 1105   KETONESUR negative 06/24/2019 1105   KETONESUR NEGATIVE 01/22/2019 2116   PROTEINUR negative 06/24/2019 1105   PROTEINUR NEGATIVE 01/22/2019 2116   UROBILINOGEN 0.2 06/24/2019 1105   UROBILINOGEN 0.2 02/10/2014 1052   NITRITE Negative 06/24/2019 1105   NITRITE NEGATIVE 01/22/2019 2116   LEUKOCYTESUR Negative 06/24/2019 1105   LEUKOCYTESUR NEGATIVE 01/22/2019 2116   Sepsis Labs Invalid input(s): PROCALCITONIN,  WBC,  LACTICIDVEN Microbiology Recent Results (from the past 240 hour(s))  SARS Coronavirus 2 by RT PCR (hospital order, performed in [facility] hospital lab) Nasopharyngeal Nasopharyngeal Swab     Status: None   Collection Time: 07/25/20 12:40 AM   Specimen: Nasopharyngeal Swab  Result Value Ref Range Status   SARS  Coronavirus 2 NEGATIVE NEGATIVE Final    Comment: (NOTE) SARS-CoV-2 target nucleic acids are NOT DETECTED.  The SARS-CoV-2 RNA is generally detectable in upper and lower respiratory specimens during the acute phase of infection. The lowest concentration of SARS-CoV-2 viral copies this assay can detect is 250 copies / mL. A negative result does not preclude SARS-CoV-2 infection and should not be used as the sole basis for treatment or other patient management decisions.  A negative result may occur with improper specimen collection / handling, submission of specimen other than nasopharyngeal swab, presence of viral mutation(s) within the areas targeted by this assay, and inadequate number of viral copies (<250 copies / mL). A negative result must be combined with clinical observations, patient history, and epidemiological information.  Fact Sheet for Patients:   BoilerBrush.com.cy  Fact Sheet for Healthcare Providers: https://pope.com/  This test is not yet approved or  cleared by the [place] FDA and has been authorized for detection and/or diagnosis of SARS-CoV-2 by FDA under an Emergency Use Authorization (EUA).  This EUA will remain in effect (meaning this test can be used) for the duration of the COVID-19 declaration under Section 564(b)(1) of the Act, 21 U.S.C. section 360bbb-3(b)(1), unless the authorization is terminated or revoked sooner.  Performed at [facility], [address] [place], [place] [address]      Time coordinating discharge: Over 30 minutes  SIGNED:   Azucena Fallen, DO [facility] 07/25/2020, 7:47 PM Pager   If 7PM-7AM, please contact night-coverage [email or web address]

## 2020-08-03 ENCOUNTER — Other Ambulatory Visit (HOSPITAL_COMMUNITY): Payer: Self-pay | Admitting: Family Medicine

## 2020-08-04 ENCOUNTER — Other Ambulatory Visit (HOSPITAL_COMMUNITY): Payer: Self-pay | Admitting: Family Medicine

## 2020-08-29 ENCOUNTER — Encounter: Payer: Self-pay | Admitting: Adult Health

## 2020-08-29 ENCOUNTER — Inpatient Hospital Stay: Payer: Managed Care, Other (non HMO) | Admitting: Adult Health

## 2020-10-13 ENCOUNTER — Other Ambulatory Visit (HOSPITAL_BASED_OUTPATIENT_CLINIC_OR_DEPARTMENT_OTHER): Payer: Self-pay

## 2020-10-13 MED ORDER — ONDANSETRON 4 MG PO TBDP
4.0000 mg | ORAL_TABLET | ORAL | 0 refills | Status: DC
  Filled 2020-11-03: qty 20, 7d supply, fill #0

## 2020-10-13 MED ORDER — NURTEC 75 MG PO TBDP
ORAL_TABLET | ORAL | 3 refills | Status: DC
  Filled 2020-10-31: qty 8, 16d supply, fill #0
  Filled 2021-02-16: qty 8, 16d supply, fill #1

## 2020-10-13 MED ORDER — VENLAFAXINE HCL ER 150 MG PO CP24
150.0000 mg | ORAL_CAPSULE | ORAL | 0 refills | Status: DC
  Filled 2020-10-31: qty 30, 30d supply, fill #0

## 2020-10-13 MED ORDER — CLOPIDOGREL BISULFATE 75 MG PO TABS
ORAL_TABLET | ORAL | 0 refills | Status: DC
  Filled 2020-10-31: qty 21, 21d supply, fill #0

## 2020-10-14 ENCOUNTER — Other Ambulatory Visit (HOSPITAL_BASED_OUTPATIENT_CLINIC_OR_DEPARTMENT_OTHER): Payer: Self-pay

## 2020-10-17 ENCOUNTER — Other Ambulatory Visit (HOSPITAL_BASED_OUTPATIENT_CLINIC_OR_DEPARTMENT_OTHER): Payer: Self-pay

## 2020-10-31 ENCOUNTER — Other Ambulatory Visit (HOSPITAL_BASED_OUTPATIENT_CLINIC_OR_DEPARTMENT_OTHER): Payer: Self-pay

## 2020-10-31 MED FILL — Pantoprazole Sodium EC Tab 40 MG (Base Equiv): ORAL | 30 days supply | Qty: 30 | Fill #0 | Status: AC

## 2020-10-31 MED FILL — Levothyroxine Sodium Tab 100 MCG: ORAL | 30 days supply | Qty: 30 | Fill #0 | Status: AC

## 2020-11-03 ENCOUNTER — Other Ambulatory Visit (HOSPITAL_BASED_OUTPATIENT_CLINIC_OR_DEPARTMENT_OTHER): Payer: Self-pay

## 2020-11-03 MED ORDER — AMOXICILLIN-POT CLAVULANATE 875-125 MG PO TABS
ORAL_TABLET | ORAL | 0 refills | Status: DC
  Filled 2020-11-03: qty 14, 7d supply, fill #0

## 2020-11-03 MED ORDER — TRIAMCINOLONE ACETONIDE 55 MCG/ACT NA AERO
INHALATION_SPRAY | NASAL | 0 refills | Status: DC
  Filled 2020-11-03: qty 6.7, 30d supply, fill #0

## 2020-11-04 ENCOUNTER — Other Ambulatory Visit (HOSPITAL_BASED_OUTPATIENT_CLINIC_OR_DEPARTMENT_OTHER): Payer: Self-pay

## 2020-11-04 MED ORDER — PROMETHAZINE HCL 25 MG PO TABS
ORAL_TABLET | ORAL | 0 refills | Status: DC
  Filled 2020-11-04: qty 30, 8d supply, fill #0
  Filled 2020-11-18: qty 30, 7d supply, fill #0

## 2020-11-04 MED ORDER — SUMATRIPTAN SUCCINATE 100 MG PO TABS
ORAL_TABLET | ORAL | 2 refills | Status: DC
  Filled 2020-11-04: qty 9, 18d supply, fill #0
  Filled 2020-11-18: qty 9, 8d supply, fill #0
  Filled 2021-02-16: qty 9, 30d supply, fill #1
  Filled 2021-06-07: qty 9, 30d supply, fill #2

## 2020-11-11 ENCOUNTER — Other Ambulatory Visit (HOSPITAL_BASED_OUTPATIENT_CLINIC_OR_DEPARTMENT_OTHER): Payer: Self-pay

## 2020-11-18 ENCOUNTER — Other Ambulatory Visit (HOSPITAL_BASED_OUTPATIENT_CLINIC_OR_DEPARTMENT_OTHER): Payer: Self-pay

## 2020-11-18 MED ORDER — ZOLPIDEM TARTRATE 10 MG PO TABS
ORAL_TABLET | ORAL | 0 refills | Status: DC
  Filled 2020-11-18: qty 30, 30d supply, fill #0

## 2020-11-22 ENCOUNTER — Other Ambulatory Visit: Payer: Self-pay | Admitting: Family Medicine

## 2020-11-22 ENCOUNTER — Other Ambulatory Visit (HOSPITAL_BASED_OUTPATIENT_CLINIC_OR_DEPARTMENT_OTHER): Payer: Self-pay

## 2020-11-22 ENCOUNTER — Ambulatory Visit
Admission: RE | Admit: 2020-11-22 | Discharge: 2020-11-22 | Disposition: A | Discharge status: Home / Self Care | Payer: Managed Care, Other (non HMO) | Source: Ambulatory Visit | Attending: Family Medicine | Admitting: Family Medicine

## 2020-11-22 DIAGNOSIS — Z1231 Encounter for screening mammogram for malignant neoplasm of breast: Secondary | ICD-10-CM

## 2020-11-22 DIAGNOSIS — R928 Other abnormal and inconclusive findings on diagnostic imaging of breast: Secondary | ICD-10-CM

## 2020-11-22 MED ORDER — PROTRIPTYLINE HCL 10 MG PO TABS
ORAL_TABLET | ORAL | 1 refills | Status: DC
  Filled 2020-11-22: qty 90, 90d supply, fill #0
  Filled 2021-03-12: qty 90, 90d supply, fill #1

## 2020-11-22 MED ORDER — FENOFIBRATE 145 MG PO TABS
ORAL_TABLET | ORAL | 1 refills | Status: DC
  Filled 2020-11-22: qty 90, 90d supply, fill #0
  Filled 2021-03-12: qty 90, 90d supply, fill #1

## 2020-11-22 MED ORDER — METOPROLOL SUCCINATE ER 100 MG PO TB24
ORAL_TABLET | ORAL | 1 refills | Status: DC
  Filled 2020-11-22: qty 90, 90d supply, fill #0
  Filled 2021-03-12: qty 90, 90d supply, fill #1

## 2020-11-22 MED ORDER — ATORVASTATIN CALCIUM 80 MG PO TABS
ORAL_TABLET | ORAL | 1 refills | Status: DC
  Filled 2020-11-22: qty 90, 90d supply, fill #0
  Filled 2021-03-12: qty 90, 90d supply, fill #1

## 2020-11-23 ENCOUNTER — Ambulatory Visit
Admission: RE | Admit: 2020-11-23 | Discharge: 2020-11-23 | Disposition: A | Discharge status: Home / Self Care | Payer: Managed Care, Other (non HMO) | Source: Ambulatory Visit | Attending: Family Medicine | Admitting: Family Medicine

## 2020-11-23 ENCOUNTER — Other Ambulatory Visit (HOSPITAL_BASED_OUTPATIENT_CLINIC_OR_DEPARTMENT_OTHER): Payer: Self-pay

## 2020-11-23 DIAGNOSIS — R928 Other abnormal and inconclusive findings on diagnostic imaging of breast: Secondary | ICD-10-CM

## 2020-11-23 MED ORDER — SOLIFENACIN SUCCINATE 10 MG PO TABS
ORAL_TABLET | ORAL | 2 refills | Status: DC
  Filled 2020-11-23: qty 30, 30d supply, fill #0
  Filled 2021-03-12: qty 30, 30d supply, fill #1

## 2020-11-24 ENCOUNTER — Other Ambulatory Visit (HOSPITAL_BASED_OUTPATIENT_CLINIC_OR_DEPARTMENT_OTHER): Payer: Self-pay

## 2020-12-07 ENCOUNTER — Other Ambulatory Visit (HOSPITAL_BASED_OUTPATIENT_CLINIC_OR_DEPARTMENT_OTHER): Payer: Self-pay

## 2020-12-07 MED ORDER — PAXLOVID 20 X 150 MG & 10 X 100MG PO TBPK
ORAL_TABLET | ORAL | 0 refills | Status: DC
  Filled 2020-12-07: qty 30, 5d supply, fill #0

## 2020-12-11 IMAGING — CT CT ABD-PELV W/ CM
2 of 5 series · 16 of 46 positions shown, 18 images · IV contrast (omnipaque)
Comparison: 11/07/2018

CLINICAL DATA: Abdominal pain

EXAM:
CT ABDOMEN AND PELVIS WITH CONTRAST
TECHNIQUE: Multidetector CT imaging of the abdomen and pelvis was performed
using the standard protocol following bolus administration of
intravenous contrast.
CONTRAST:  100mL OMNIPAQUE IOHEXOL 300 MG/ML  SOLN

[Series 2: axial st · axial · 0.87mm/px · z∈[-603,-128]mm · 13 of 107 slices shown, 15 images]
[im 6/107  soft-tissue]
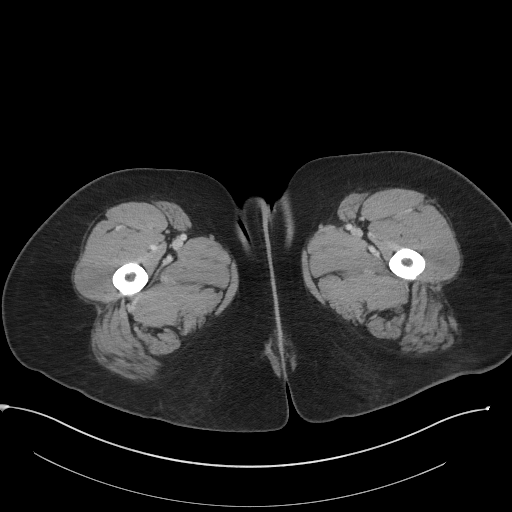
[im 6/107  bone]
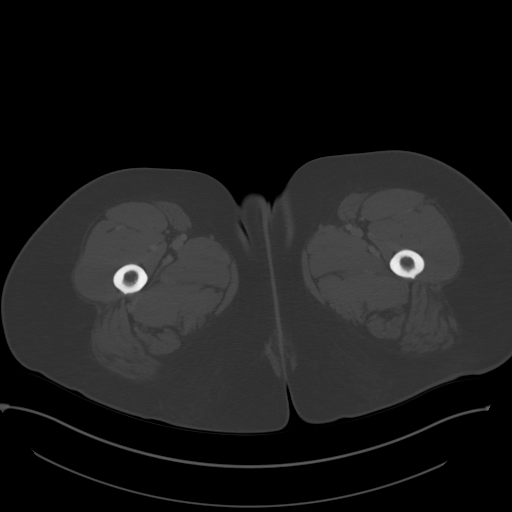
[im 17/107  soft-tissue]
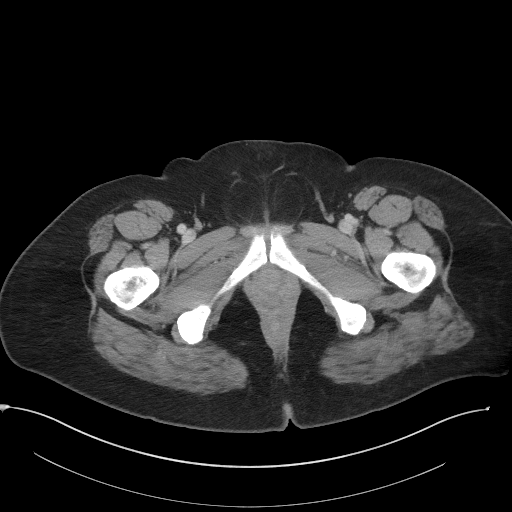
[im 23/107  soft-tissue]
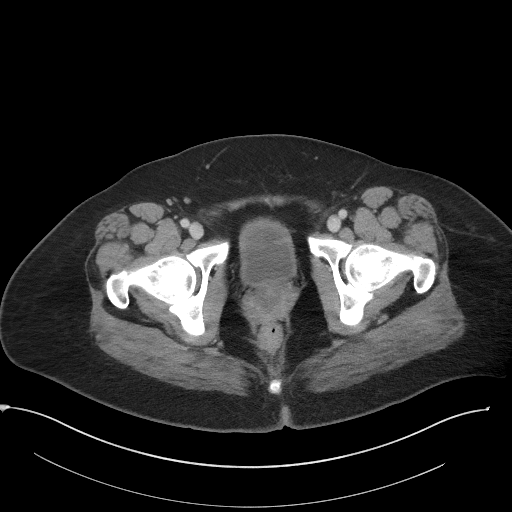
[im 28/107  soft-tissue]
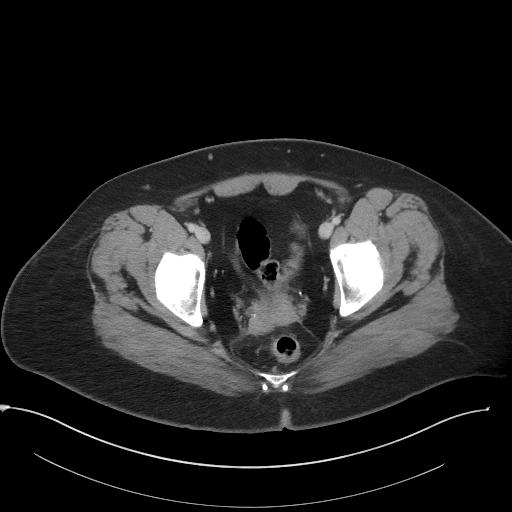
[im 40/107  soft-tissue]
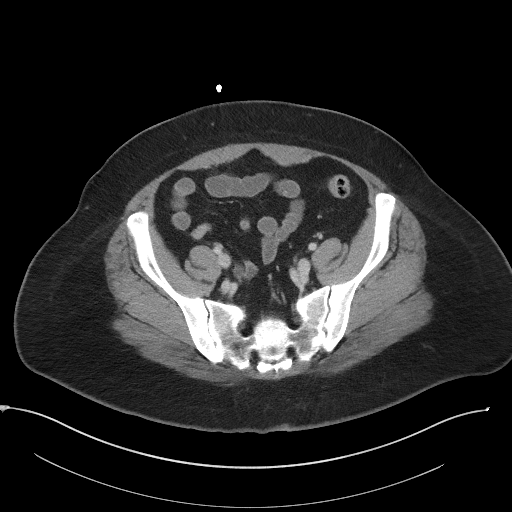
[im 45/107  soft-tissue]
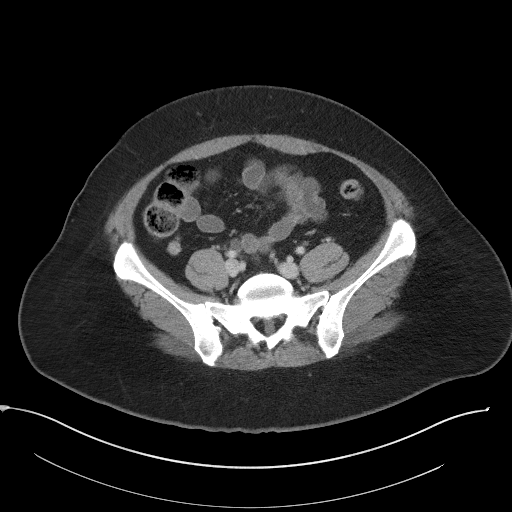
[im 56/107  soft-tissue]
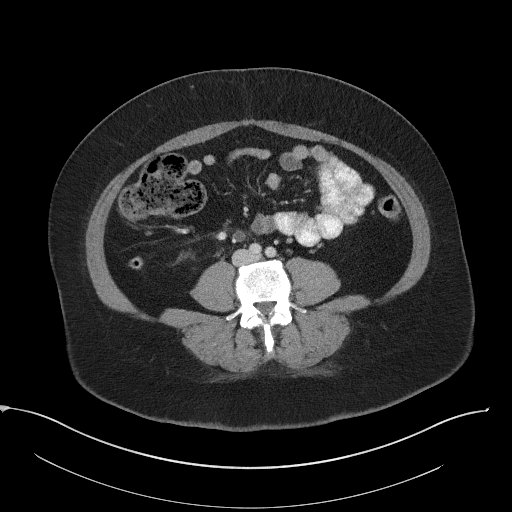
[im 62/107  soft-tissue]
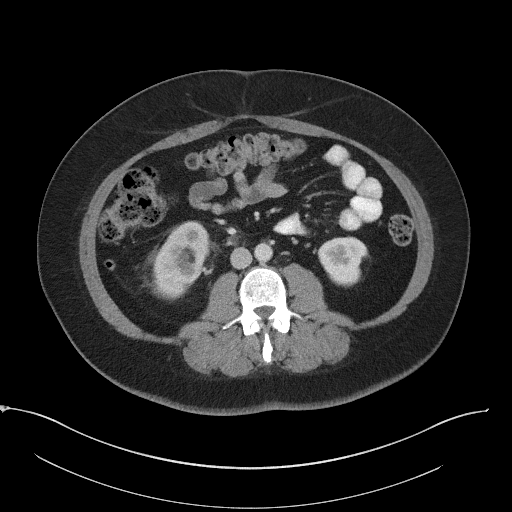
[im 67/107  soft-tissue]
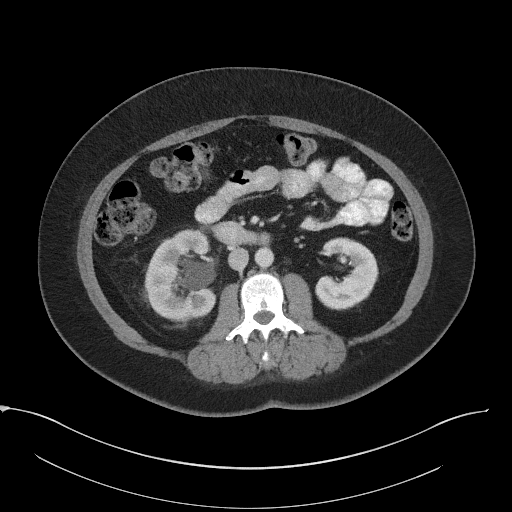
[im 67/107  bone]
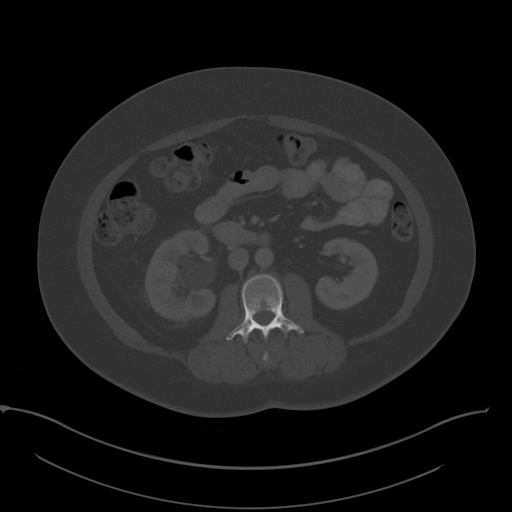
[im 79/107  soft-tissue]
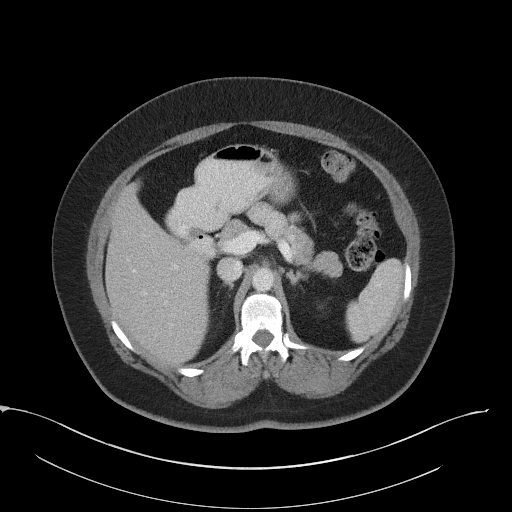
[im 84/107  soft-tissue]
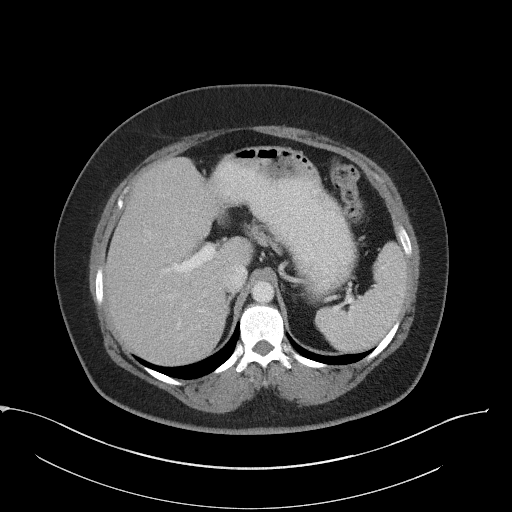
[im 90/107  soft-tissue]
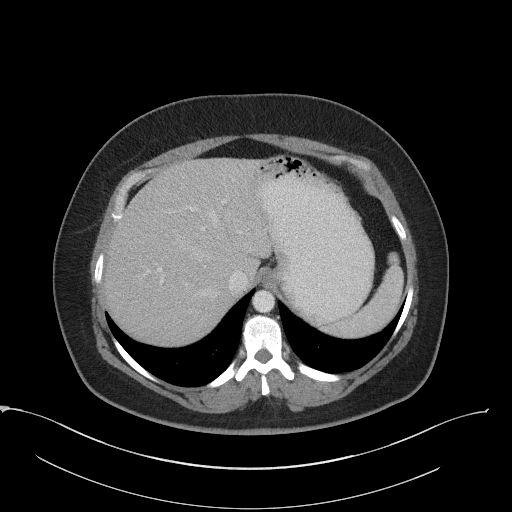
[im 101/107  soft-tissue]
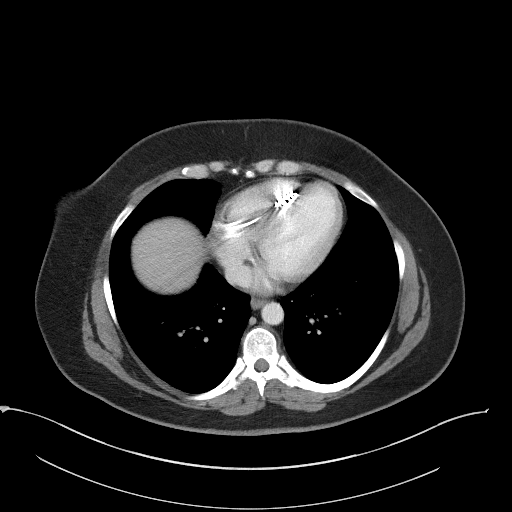

[Series 4: coronal st · coronal · 0.90mm/px · 3 of 110 slices shown]
[im 37/110  soft-tissue]
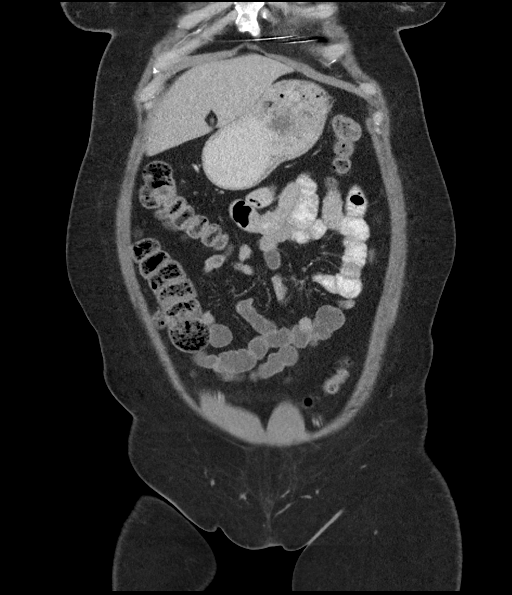
[im 49/110  soft-tissue]
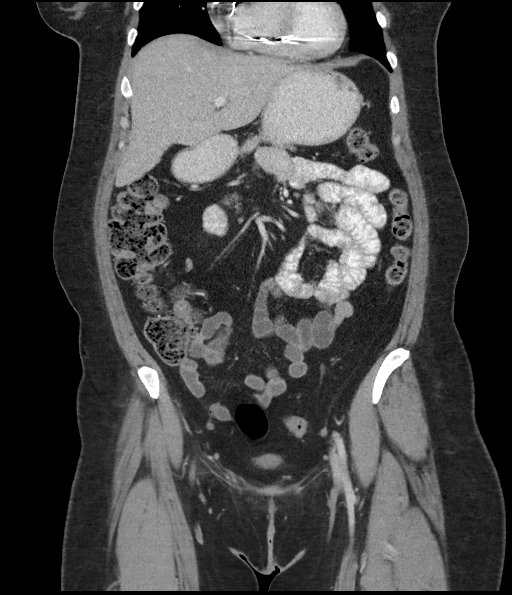
[im 61/110  soft-tissue]
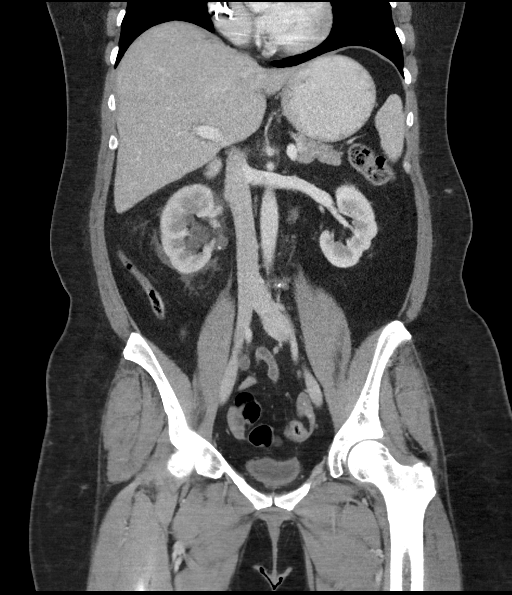

[16 of 46 positions shown; findings below may reference images not displayed]

FINDINGS: Lower chest: No acute abnormality.

Hepatobiliary: No focal liver abnormality is seen. Status post
cholecystectomy. No biliary dilatation.

Pancreas: Unremarkable.

Spleen: Unremarkable.

Adrenals/Urinary Tract: There is mild right hydronephrosis and
perinephric stranding. The right ureter is dilated to just above the
ureterovesical junction where there is an obstructing 6 mm calculus.
There are punctate bilateral nonobstructing renal calculi. Bladder
is poorly distended and not well evaluated.

Stomach/Bowel: Stomach is unremarkable. Bowel is normal in caliber.
Normal appendix.

Vascular/Lymphatic: No significant vascular findings are present. No
enlarged abdominal or pelvic lymph nodes.

Reproductive: Status post hysterectomy. No adnexal masses.

Other: No abdominal wall hernia or abnormality. No abdominopelvic
ascites.

Musculoskeletal: No significant or acute osseous abnormality.
IMPRESSION: 6 mm obstructing distal right ureteral calculus with proximal
hydroureter and hydronephrosis.

Punctate bilateral nonobstructing renal calculi.

## 2020-12-22 ENCOUNTER — Other Ambulatory Visit (HOSPITAL_BASED_OUTPATIENT_CLINIC_OR_DEPARTMENT_OTHER): Payer: Self-pay

## 2020-12-22 MED ORDER — DIAZEPAM 5 MG PO TABS
5.0000 mg | ORAL_TABLET | Freq: Three times a day (TID) | ORAL | 0 refills | Status: DC
  Filled 2020-12-22: qty 70, 24d supply, fill #0

## 2021-01-04 ENCOUNTER — Other Ambulatory Visit (HOSPITAL_BASED_OUTPATIENT_CLINIC_OR_DEPARTMENT_OTHER): Payer: Self-pay

## 2021-01-04 MED FILL — Pantoprazole Sodium EC Tab 40 MG (Base Equiv): ORAL | 30 days supply | Qty: 30 | Fill #1 | Status: AC

## 2021-02-07 ENCOUNTER — Other Ambulatory Visit (HOSPITAL_BASED_OUTPATIENT_CLINIC_OR_DEPARTMENT_OTHER): Payer: Self-pay

## 2021-02-07 MED FILL — Pantoprazole Sodium EC Tab 40 MG (Base Equiv): ORAL | 30 days supply | Qty: 30 | Fill #2 | Status: AC

## 2021-02-09 ENCOUNTER — Other Ambulatory Visit (HOSPITAL_BASED_OUTPATIENT_CLINIC_OR_DEPARTMENT_OTHER): Payer: Self-pay

## 2021-02-09 MED ORDER — DIAZEPAM 5 MG PO TABS
5.0000 mg | ORAL_TABLET | Freq: Three times a day (TID) | ORAL | 1 refills | Status: DC
  Filled 2021-02-09: qty 70, 24d supply, fill #0
  Filled 2021-03-12: qty 70, 24d supply, fill #1

## 2021-02-10 ENCOUNTER — Other Ambulatory Visit (HOSPITAL_BASED_OUTPATIENT_CLINIC_OR_DEPARTMENT_OTHER): Payer: Self-pay

## 2021-02-13 ENCOUNTER — Other Ambulatory Visit (HOSPITAL_BASED_OUTPATIENT_CLINIC_OR_DEPARTMENT_OTHER): Payer: Self-pay

## 2021-02-13 DIAGNOSIS — R0602 Shortness of breath: Secondary | ICD-10-CM | POA: Diagnosis not present

## 2021-02-13 DIAGNOSIS — R635 Abnormal weight gain: Secondary | ICD-10-CM | POA: Diagnosis not present

## 2021-02-13 MED ORDER — OZEMPIC (0.25 OR 0.5 MG/DOSE) 2 MG/1.5ML ~~LOC~~ SOPN
PEN_INJECTOR | SUBCUTANEOUS | 0 refills | Status: DC
  Filled 2021-02-13 (×2): qty 1.5, 28d supply, fill #0

## 2021-02-16 ENCOUNTER — Other Ambulatory Visit (HOSPITAL_BASED_OUTPATIENT_CLINIC_OR_DEPARTMENT_OTHER): Payer: Self-pay

## 2021-02-17 ENCOUNTER — Other Ambulatory Visit (HOSPITAL_BASED_OUTPATIENT_CLINIC_OR_DEPARTMENT_OTHER): Payer: Self-pay

## 2021-02-20 ENCOUNTER — Other Ambulatory Visit (HOSPITAL_BASED_OUTPATIENT_CLINIC_OR_DEPARTMENT_OTHER): Payer: Self-pay

## 2021-02-20 DIAGNOSIS — Z808 Family history of malignant neoplasm of other organs or systems: Secondary | ICD-10-CM | POA: Diagnosis not present

## 2021-02-20 DIAGNOSIS — Z6835 Body mass index (BMI) 35.0-35.9, adult: Secondary | ICD-10-CM | POA: Diagnosis not present

## 2021-02-20 DIAGNOSIS — Z803 Family history of malignant neoplasm of breast: Secondary | ICD-10-CM | POA: Diagnosis not present

## 2021-02-20 DIAGNOSIS — N912 Amenorrhea, unspecified: Secondary | ICD-10-CM | POA: Diagnosis not present

## 2021-02-20 DIAGNOSIS — Z01419 Encounter for gynecological examination (general) (routine) without abnormal findings: Secondary | ICD-10-CM | POA: Diagnosis not present

## 2021-02-20 MED ORDER — CLOTRIMAZOLE-BETAMETHASONE 1-0.05 % EX CREA
TOPICAL_CREAM | CUTANEOUS | 6 refills | Status: DC
  Filled 2021-02-20: qty 15, 14d supply, fill #0

## 2021-02-21 ENCOUNTER — Other Ambulatory Visit (HOSPITAL_BASED_OUTPATIENT_CLINIC_OR_DEPARTMENT_OTHER): Payer: Self-pay

## 2021-02-23 ENCOUNTER — Other Ambulatory Visit (HOSPITAL_BASED_OUTPATIENT_CLINIC_OR_DEPARTMENT_OTHER): Payer: Self-pay

## 2021-02-28 ENCOUNTER — Other Ambulatory Visit (HOSPITAL_BASED_OUTPATIENT_CLINIC_OR_DEPARTMENT_OTHER): Payer: Self-pay

## 2021-03-01 ENCOUNTER — Other Ambulatory Visit: Payer: Self-pay

## 2021-03-01 ENCOUNTER — Emergency Department (HOSPITAL_BASED_OUTPATIENT_CLINIC_OR_DEPARTMENT_OTHER)
Admission: EM | Admit: 2021-03-01 | Discharge: 2021-03-02 | Disposition: A | Discharge status: Home / Self Care | Payer: 59 | Attending: Emergency Medicine | Admitting: Emergency Medicine

## 2021-03-01 ENCOUNTER — Encounter (HOSPITAL_BASED_OUTPATIENT_CLINIC_OR_DEPARTMENT_OTHER): Payer: Self-pay | Admitting: *Deleted

## 2021-03-01 DIAGNOSIS — K219 Gastro-esophageal reflux disease without esophagitis: Secondary | ICD-10-CM | POA: Insufficient documentation

## 2021-03-01 DIAGNOSIS — Z79899 Other long term (current) drug therapy: Secondary | ICD-10-CM | POA: Diagnosis not present

## 2021-03-01 DIAGNOSIS — Z7902 Long term (current) use of antithrombotics/antiplatelets: Secondary | ICD-10-CM | POA: Insufficient documentation

## 2021-03-01 DIAGNOSIS — E876 Hypokalemia: Secondary | ICD-10-CM | POA: Diagnosis not present

## 2021-03-01 DIAGNOSIS — E039 Hypothyroidism, unspecified: Secondary | ICD-10-CM | POA: Insufficient documentation

## 2021-03-01 DIAGNOSIS — R197 Diarrhea, unspecified: Secondary | ICD-10-CM | POA: Diagnosis not present

## 2021-03-01 DIAGNOSIS — R1012 Left upper quadrant pain: Secondary | ICD-10-CM | POA: Insufficient documentation

## 2021-03-01 DIAGNOSIS — I10 Essential (primary) hypertension: Secondary | ICD-10-CM | POA: Diagnosis not present

## 2021-03-01 DIAGNOSIS — R112 Nausea with vomiting, unspecified: Secondary | ICD-10-CM | POA: Diagnosis not present

## 2021-03-01 DIAGNOSIS — T50905A Adverse effect of unspecified drugs, medicaments and biological substances, initial encounter: Secondary | ICD-10-CM

## 2021-03-01 DIAGNOSIS — T383X5A Adverse effect of insulin and oral hypoglycemic [antidiabetic] drugs, initial encounter: Secondary | ICD-10-CM | POA: Diagnosis not present

## 2021-03-01 LAB — COMPREHENSIVE METABOLIC PANEL
ALT: 19 U/L (ref 0–44)
AST: 20 U/L (ref 15–41)
Albumin: 4.6 g/dL (ref 3.5–5.0)
Alkaline Phosphatase: 54 U/L (ref 38–126)
Anion gap: 7 (ref 5–15)
BUN: 10 mg/dL (ref 6–20)
CO2: 27 mmol/L (ref 22–32)
Calcium: 9.2 mg/dL (ref 8.9–10.3)
Chloride: 104 mmol/L (ref 98–111)
Creatinine, Ser: 0.85 mg/dL (ref 0.44–1.00)
GFR, Estimated: >60 mL/min (ref >60)
Glucose, Bld: 93 mg/dL (ref 70–99)
Potassium: 3 mmol/L — ABNORMAL LOW (ref 3.5–5.1)
Sodium: 138 mmol/L (ref 135–145)
Total Bilirubin: 0.5 mg/dL (ref 0.3–1.2)
Total Protein: 7.6 g/dL (ref 6.5–8.1)

## 2021-03-01 LAB — CBC WITH DIFFERENTIAL/PLATELET
Abs Immature Granulocytes: 0.01 10*3/uL (ref 0.00–0.07)
Basophils Absolute: 0 10*3/uL (ref 0.0–0.1)
Basophils Relative: 1 %
Eosinophils Absolute: 0.3 10*3/uL (ref 0.0–0.5)
Eosinophils Relative: 4 %
HCT: 38.7 % (ref 36.0–46.0)
Hemoglobin: 13.5 g/dL (ref 12.0–15.0)
Immature Granulocytes: 0 %
Lymphocytes Relative: 36 %
Lymphs Abs: 2.6 10*3/uL (ref 0.7–4.0)
MCH: 30.8 pg (ref 26.0–34.0)
MCHC: 34.9 g/dL (ref 30.0–36.0)
MCV: 88.2 fL (ref 80.0–100.0)
Monocytes Absolute: 0.6 10*3/uL (ref 0.1–1.0)
Monocytes Relative: 9 %
Neutro Abs: 3.7 10*3/uL (ref 1.7–7.7)
Neutrophils Relative %: 50 %
Platelets: 248 10*3/uL (ref 150–400)
RBC: 4.39 MIL/uL (ref 3.87–5.11)
RDW: 12.5 % (ref 11.5–15.5)
WBC: 7.2 10*3/uL (ref 4.0–10.5)
nRBC: 0 % (ref 0.0–0.2)

## 2021-03-01 LAB — LIPASE, BLOOD: Lipase: 35 U/L (ref 11–51)

## 2021-03-01 MED ORDER — DICYCLOMINE HCL 10 MG/ML IM SOLN
20.0000 mg | Freq: Once | INTRAMUSCULAR | Status: AC
  Administered 2021-03-01: 20 mg via INTRAMUSCULAR
  Filled 2021-03-01: qty 2

## 2021-03-01 MED ORDER — POTASSIUM CHLORIDE ER 10 MEQ PO TBCR
10.0000 meq | EXTENDED_RELEASE_TABLET | Freq: Every day | ORAL | 0 refills | Status: DC
  Filled 2021-03-01: qty 5, 5d supply, fill #0

## 2021-03-01 MED ORDER — ONDANSETRON HCL 4 MG/2ML IJ SOLN
4.0000 mg | Freq: Once | INTRAMUSCULAR | Status: AC
  Administered 2021-03-01: 4 mg via INTRAVENOUS
  Filled 2021-03-01: qty 2

## 2021-03-01 MED ORDER — POTASSIUM CHLORIDE 10 MEQ/100ML IV SOLN
10.0000 meq | Freq: Once | INTRAVENOUS | Status: AC
  Administered 2021-03-01: 10 meq via INTRAVENOUS
  Filled 2021-03-01: qty 100

## 2021-03-01 MED ORDER — SODIUM CHLORIDE 0.9 % IV BOLUS
1000.0000 mL | Freq: Once | INTRAVENOUS | Status: AC
  Administered 2021-03-01: 1000 mL via INTRAVENOUS

## 2021-03-01 MED ORDER — PROMETHAZINE HCL 25 MG RE SUPP
25.0000 mg | Freq: Four times a day (QID) | RECTAL | 0 refills | Status: DC | PRN
  Filled 2021-03-01: qty 12, 3d supply, fill #0

## 2021-03-01 NOTE — Discharge Instructions (Addendum)
Please pick up medications and take as prescribed. Follow up with [facility] for further evaluation of your reaction to the Ozempic. It is recommended that you discuss decreasing dosage vs stopping altogether given adverse reaction.   You will need to have your potassium level rechecked in 1-2 weeks given it was slightly low today.   Return to the ED IMMEDIATELY for any new/worsening symptoms

## 2021-03-01 NOTE — ED Provider Notes (Signed)
[facility] EMERGENCY DEPARTMENT Provider Note   CSN: [id number] Arrival date & time: 03/01/21  2204     History Chief Complaint  Patient presents with   Abdominal Pain    Carmen [name] is a 40 y.o. female who presents to the ED today with complaint of gradual onset, constant, achy, upper abdominal pain for the past 2 days.  Patient reports she recently started Ozempic about 3 weeks ago.  For the first week she was having some nausea however it seemed to dissipate.  2 days ago she increased her dose from 0.25-0.5.  She went out to eat that night and began having abdominal pain, nausea, vomiting.  She states she has vomited several times since then.  She also noticed that her urine was very dark in color today and her husband made her come to the ED for further evaluation.  She denies any fevers or chills.  She does states she is having some mild diarrhea as well.  Her husband ate similar stuff as she did 2 days ago at [employer] and he is not symptomatic.  Past surgical history includes hysterectomy as well as cholecystectomy.  She denies any flank pain or urinary symptoms including dysuria, urinary frequency, urgency.   The history is provided by the patient and medical records.      Past Medical History:  Diagnosis Date   Anxiety    Anxiety    Back pain    Costochondritis 09/13/2010   Depression    Depression    Gallbladder problem    GERD (gastroesophageal reflux disease)    Headache    migrains   Heart palpitations    long history of    Heartburn    History of chest pain    History of cholecystitis    Laparoscopic cholecystectomy   History of fatigue    History of kidney stones    History of migraine headaches    History of seizure disorder    remote history of seizure activity and was evaluated at [facility] for this in the past  She is presently not on any seizure  medications and has not had any seizures for many years.   History of tilt table  evaluation    IMPRESSION: Positive tilt table study with significant cardioinhibitory  response.   Hyperlipidemia    Hypertension    Hypothyroidism    Kidney stone    Kidney stones    history of   Neurocardiogenic syncope    Palpitations    Pericarditis 09/13/2010   Presence of permanent cardiac pacemaker    PSVT (paroxysmal supraventricular tachycardia) (HCC)    Right ureteral calculus    Sleep apnea    osa no cpap needed   Sleeping difficulties    SOBOE (shortness of breath on exertion)    Stress    Stroke (HCC) 06/15/2019   no residual deficits, tpa given at time of stroke   Stroke [facility])    SVD (spontaneous vaginal delivery)    x 1   Syncope and collapse    neurocardiogenic    Patient Active Problem List   Diagnosis Date Noted   Acute right-sided weakness 07/25/2020   Depression with anxiety 07/25/2020   Hypokalemia 07/25/2020   Mood disorder (HCC) 07/07/2020   Other fatigue 07/07/2020   SOBOE (shortness of breath on exertion) 07/07/2020   Essential hypertension 07/07/2020   Hypothyroidism 07/07/2020   Migraine 07/07/2020   Vitamin D deficiency 07/07/2020   At risk  for impaired metabolic function 07/07/2020   Hyperlipidemia 06/18/2019   Stroke-like episode s/p tPA 06/15/2019   Pelvic prolapse 11/17/2017   Incontinence of urine in female 11/14/2017   PSVT (paroxysmal supraventricular tachycardia) (HCC)    Heart palpitations    Syncope and collapse    Sleeping difficulties    Pericarditis 09/13/2010   Syncope 09/13/2010   Costochondritis 09/13/2010    Past Surgical History:  Procedure Laterality Date   ABDOMINAL HYSTERECTOMY     ANTERIOR AND POSTERIOR REPAIR N/A 11/14/2017   Procedure: ANTERIOR (CYSTOCELE) AND POSTERIOR REPAIR (RECTOCELE);  Surgeon: Silverio Lay, MD;  Location: [facility];  Service: Gynecology;  Laterality: N/A;   BILATERAL SALPINGECTOMY Bilateral 11/14/2017   Procedure: BILATERAL SALPINGECTOMY;  Surgeon: Silverio Lay, MD;  Location: [facility]  [facility];  Service: Gynecology;  Laterality: Bilateral;   BLADDER SUSPENSION Bilateral 11/14/2017   Procedure: TRANSVAGINAL TAPE (TVT) PROCEDURE;  Surgeon: Osborn Coho, MD;  Location: [facility];  Service: Gynecology;  Laterality: Bilateral;   CHOLECYSTECTOMY  11/2007   Cholecystitis - Laparoscopic cholecystectomy with intraoperative  cholangiogram.   CHOLECYSTECTOMY     CYSTOSCOPY N/A 11/14/2017   Procedure: CYSTOSCOPY;  Surgeon: Osborn Coho, MD;  Location: [facility];  Service: Gynecology;  Laterality: N/A;   CYSTOSCOPY WITH RETROGRADE PYELOGRAM, URETEROSCOPY AND STENT PLACEMENT Right 06/25/2019   Procedure: CYSTOSCOPY WITH RETROGRADE PYELOGRAM, AND RIGHT  STENT PLACEMENT;  Surgeon: Noel Christmas, MD;  Location: [facility];  Service: Urology;  Laterality: Right;   CYSTOSCOPY WITH RETROGRADE PYELOGRAM, URETEROSCOPY AND STENT PLACEMENT Right 07/07/2019   Procedure: CYSTOSCOPY , URETEROSCOPY AND STENT REPLACEMENT;  Surgeon: Noel Christmas, MD;  Location: [facility];  Service: Urology;  Laterality: Right;   HOLMIUM LASER APPLICATION Right 07/07/2019   Procedure: HOLMIUM LASER APPLICATION;  Surgeon: Noel Christmas, MD;  Location: [facility];  Service: Urology;  Laterality: Right;   PACEMAKER INSERTION  2016   Tilt Table Study  07/17/2005   Positive tilt table study with significant cardioinhibitory  response.   VAGINAL HYSTERECTOMY N/A 11/14/2017   Procedure: HYSTERECTOMY VAGINAL;  Surgeon: Silverio Lay, MD;  Location: [facility];  Service: Gynecology;  Laterality: N/A;   WISDOM TOOTH EXTRACTION       OB History     Gravida  2   Para  1   Term  1   Preterm      AB      Living  1      SAB      IAB      Ectopic      Multiple      Live Births              Family History  Problem Relation Age of Onset   Hypertension Mother    Heart disease Mother    Cancer Mother        breast   Breast cancer Mother 83   Diabetes Mother     Hyperlipidemia Mother    Thyroid disease Mother    Depression Mother    Anxiety disorder Mother    Bipolar disorder Mother    Liver disease Mother    Alcoholism Mother    Obesity Mother    Heart attack Father    Hyperlipidemia Father    Hepatitis C Father    Diabetes Father    Hypertension Father    Heart disease Father    Sudden Cardiac Death Father    Depression Father    Anxiety disorder Father  Liver disease Father    Alcoholism Father    Drug abuse Father    Obesity Father    Cancer Maternal Aunt        Breast   Breast cancer Maternal Aunt 61   Breast cancer Maternal Grandmother 79    Social History   Tobacco Use   Smoking status: Never   Smokeless tobacco: Never  Vaping Use   Vaping Use: Never used  Substance Use Topics   Alcohol use: Yes    Comment: rare   Drug use: No    Home Medications Prior to Admission medications   Medication Sig Start Date End Date Taking? Authorizing Provider  potassium chloride (KLOR-CON) 10 MEQ tablet Take 1 tablet (10 mEq total) by mouth daily for 5 days. 03/01/21 03/06/21 Yes Keyen Marban, PA-C  promethazine (PHENERGAN) 25 MG suppository Place 1 suppository (25 mg total) rectally every 6 (six) hours as needed for nausea or vomiting. 03/01/21  Yes Fatoumata Albaugh, PA-C  AIMOVIG 140 MG/ML SOAJ Inject 140 mg into the skin every 30 (thirty) days. 05/26/19   [provider]  amoxicillin-clavulanate (AUGMENTIN) 875-125 MG tablet Take 1 tablet by mouth 2 times daily for 7 days. 11/03/20     aspirin EC 81 MG EC tablet Take 1 tablet (81 mg total) by mouth daily. Swallow whole. 07/26/20   Azucena Fallen, MD  atorvastatin (LIPITOR) 80 MG tablet Take 1 tablet (80 mg total) by mouth daily at 6 PM. 07/26/20   Azucena Fallen, MD  atorvastatin (LIPITOR) 80 MG tablet Take one tablet (80 mg dose) by mouth daily. 11/22/20     clopidogrel (PLAVIX) 75 MG tablet Take 1 tablet (75 mg total) by mouth daily. 07/26/20   Azucena Fallen, MD   clopidogrel (PLAVIX) 75 MG tablet Take one tablet by mouth once daily. 07/25/20   Azucena Fallen, MD  clotrimazole-betamethasone (LOTRISONE) cream Apply to the affected and surrounding areas of the skin 2 times daily in the morning and evening for 2 weeks 02/20/21     diazepam (VALIUM) 5 MG tablet Take 5 mg by mouth every 12 (twelve) hours as needed for anxiety. 05/09/20   [provider]  diazepam (VALIUM) 5 MG tablet TAKE 1 TABLET BY MOUTH THREE TIMES DAILY. 02/09/21     EUTHYROX 75 MCG tablet Take 75 mcg by mouth daily. 05/15/20   [provider]  fenofibrate (TRICOR) 145 MG tablet Take 145 mg by mouth daily. 01/18/20   [provider]  fenofibrate (TRICOR) 145 MG tablet Take one tablet (145 mg dose) by mouth daily. 11/22/20     levothyroxine (SYNTHROID) 100 MCG tablet TAKE 1 TABLET BY MOUTH ONCE DAILY 08/04/20 08/04/21  Marthenia Rolling, MD  Magnesium 400 MG TABS Take 400 mg by mouth daily.    [provider]  metoprolol succinate (TOPROL-XL) 100 MG 24 hr tablet Take one tablet (100 mg dose) by mouth daily. 11/22/20     metoprolol succinate (TOPROL-XL) 50 MG 24 hr tablet Take 100 mg by mouth daily. Take with or immediately following a meal.    [provider]  Nirmatrelvir & Ritonavir (PAXLOVID) 20 x 150 MG & 10 x  TBPK Take two (2) nirmatrelvir 150 mg tablets and one (1) ritonavir 100 mg tablet by mouth twice daily as indicated on the medication dosing blister pack. 12/07/20     omeprazole (PRILOSEC) 40 MG capsule Take 40 mg by mouth daily.    [provider]  ondansetron (ZOFRAN-ODT) 4  MG disintegrating tablet Dissolve one tablet by every 8 hours as needed for nausea and vomiting 02/10/20   Sabas SousBero, Michael M, MD  pantoprazole (PROTONIX) 40 MG tablet TAKE ONE TABLET (40 MG DOSE) BY MOUTH DAILY. 08/03/20 08/03/21  Hallet, Sarah Daubenschmidt, PA-C  Probiotic Product (PROBIOTIC PO) Take 1 capsule by mouth daily.    [provider]   promethazine (PHENERGAN) 25 MG tablet Take one tablet (25 mg dose) by mouth every 6 (six) hours as needed for nausea 11/04/20     protriptyline (VIVACTIL) 10 MG tablet Take 10 mg by mouth at bedtime. 01/18/20   [provider]  protriptyline (VIVACTIL) 10 MG tablet Take one tablet (10 mg dose) by mouth at bedtime. 11/22/20     Rimegepant Sulfate (NURTEC) 75 MG TBDP Dissolve one tablet by mouth as needed. 04/15/20     Semaglutide,0.25 or 0.5MG /DOS, (OZEMPIC, 0.25 OR 0.5 MG/DOSE,) 2 MG/1.5ML SOPN Inject 0.25 mg under skin once a week 02/13/21     solifenacin (VESICARE) 10 MG tablet Take one tablet (10 mg dose) by mouth daily. 11/23/20     SUMAtriptan (IMITREX) 100 MG tablet Take 100 mg by mouth every 2 (two) hours as needed for migraine. May repeat in 2 hours if headache persists or recurs.    [provider]  SUMAtriptan (IMITREX) 100 MG tablet Take 1 tablet by mouth once daily as needed for migraine. May repeat ONCE in 2 hours. 11/04/20     topiramate (TOPAMAX) 50 MG tablet Take 50 mg by mouth 2 (two) times daily. 01/18/20   [provider]  triamcinolone (NASACORT) 55 MCG/ACT AERO nasal inhaler Spray 2 sprays into each nostril daily. 11/03/20     venlafaxine XR (EFFEXOR-XR) 150 MG 24 hr capsule Take 150 mg by mouth daily.    [provider]  venlafaxine XR (EFFEXOR-XR) 150 MG 24 hr capsule Take one capsule by mouth once daily 09/03/20     zolpidem (AMBIEN) 10 MG tablet Take 10 mg by mouth every other day. 05/09/20   [provider]  zolpidem (AMBIEN) 10 MG tablet Take one tablet (10 mg dose) by mouth at bedtime as needed for Sleep 11/18/20       Allergies    Tape, Dilaudid [hydromorphone hcl], Prochlorperazine, Quinolones, and Reglan [metoclopramide]  Review of Systems   Review of Systems  Constitutional:  Negative for chills and fever.  Gastrointestinal:  Positive for abdominal distention, abdominal pain, diarrhea, nausea and vomiting.  All other systems  reviewed and are negative.  Physical Exam Updated Vital Signs BP (!) 144/112   Pulse 90   Temp 98.1 F (36.7 C) (Oral)   Resp 16   Ht 5\' 2"  (1.575 m)   Wt 87.5 kg   LMP  (LMP Unknown)   SpO2 100%   BMI 35.30 kg/m   Physical Exam Vitals and nursing note reviewed.  Constitutional:      Appearance: She is not ill-appearing or diaphoretic.  HENT:     Head: Normocephalic and atraumatic.  Eyes:     Conjunctiva/sclera: Conjunctivae normal.  Cardiovascular:     Rate and Rhythm: Normal rate and regular rhythm.     Heart sounds: Normal heart sounds.  Pulmonary:     Effort: Pulmonary effort is normal.     Breath sounds: Normal breath sounds. No wheezing, rhonchi or rales.  Abdominal:     General: Bowel sounds are normal.     Palpations: Abdomen is soft.     Tenderness: There is abdominal  tenderness in the left upper quadrant.     Comments: Mild abdominal bloating. Active BS throughout all quadrants. + LUQ TTP. No rebound or guarding.   Musculoskeletal:     Cervical back: Neck supple.  Skin:    General: Skin is warm and dry.  Neurological:     Mental Status: She is alert.    ED Results / Procedures / Treatments   Labs (all labs ordered are listed, but only abnormal results are displayed) Labs Reviewed  COMPREHENSIVE METABOLIC PANEL - Abnormal; Notable for the following components:      Result Value   Potassium 3.0 (*)    All other components within normal limits  CBC WITH DIFFERENTIAL/PLATELET  LIPASE, BLOOD  URINALYSIS, ROUTINE W REFLEX MICROSCOPIC    EKG None  Radiology No results found.  Procedures Procedures   Medications Ordered in ED Medications  potassium chloride 10 mEq in 100 mL IVPB (10 mEq Intravenous New Bag/Given 03/01/21 2326)  sodium chloride 0.9 % bolus 1,000 mL (1,000 mLs Intravenous New Bag/Given 03/01/21 2250)  ondansetron (ZOFRAN) injection 4 mg (4 mg Intravenous Given 03/01/21 2256)  dicyclomine (BENTYL) injection 20 mg (20 mg Intramuscular  Given 03/01/21 2318)    ED Course  I have reviewed the triage vital signs and the nursing notes.  Pertinent labs & imaging results that were available during my care of the patient were reviewed by me and considered in my medical decision making (see chart for details).    MDM Rules/Calculators/A&P                           40 year old female who presents to the ED today with complaint of abdominal pain, nausea, vomiting, diarrhea for the past 2 days.  Recently increased dose of Ozempic 2 days ago prior to symptoms starting.  On arrival to the ED today vitals are stable and patient appears to be in no acute distress.  She does have a slightly bloated abdomen at this time however active bowel sounds throughout.  Tenderness palpation to the left upper quadrant.  Denies any heavy alcohol use.  We will plan for fluids, antiemetics, lab work.  I do suspect she is likely having adverse reaction from increasing her Ozempic dose however if lab work significantly abnormal may consider CT scan.  I have very low suspicion for SBO given she is having active bowel sounds throughout and having diarrhea today.   CBC without leukocytosis. Hgb stable at 13.5 CMP with potassium 3.0; will replete in the ED. Suspect likely secondary to GI loss. No other electrolyte abnormalities.  Lipase WNL at 35  On reevaluation pt resting comfortably. Pending urine sample at this time - pt does report dark colored urine likely s/2 dehydration however would like to evaluate for other abnormalities. Will plan to fluid challenge prior to discharge and discharge home with antiemetics and potassium supplements. Pt reports she has oral phenergan and zofran at home however still vomiting at home despite same. Will discharge with phenergan suppositories. I have recommended she follow up with [facility] who provides her ozempic to discuss other options vs going back down to half dose next week.   At shift change case signed  out to oncoming provider Dr. Bebe Shaggy who will dispo accordingly after U/A has resulted and pt fluid challenged successfully. Meds have been sent in to pharmacy prior to discharge.   This note was prepared using Conservation officer, historic buildings and may include  unintentional dictation errors due to the inherent limitations of voice recognition software.  Final Clinical Impression(s) / ED Diagnoses Final diagnoses:  Left upper quadrant abdominal pain  Nausea vomiting and diarrhea  Medication reaction, initial encounter  Hypokalemia    Rx / DC Orders ED Discharge Orders          Ordered    promethazine (PHENERGAN) 25 MG suppository  Every 6 hours PRN        03/01/21 2354    potassium chloride (KLOR-CON) 10 MEQ tablet  Daily        03/01/21 2354             Tanda Rockers, PA-C 03/02/21 0000    Zadie Rhine, MD 03/02/21 [phone]

## 2021-03-01 NOTE — ED Notes (Signed)
Pt given water, gingerale and crackers.

## 2021-03-01 NOTE — ED Triage Notes (Signed)
C/o abd pain n/v and bloating x 2 days after starting new medication  ozempic

## 2021-03-02 ENCOUNTER — Other Ambulatory Visit (HOSPITAL_BASED_OUTPATIENT_CLINIC_OR_DEPARTMENT_OTHER): Payer: Self-pay

## 2021-03-02 LAB — URINALYSIS, MICROSCOPIC (REFLEX)

## 2021-03-02 LAB — URINALYSIS, ROUTINE W REFLEX MICROSCOPIC
Bilirubin Urine: NEGATIVE
Glucose, UA: NEGATIVE mg/dL
Ketones, ur: NEGATIVE mg/dL
Nitrite: NEGATIVE
Protein, ur: NEGATIVE mg/dL
Specific Gravity, Urine: 1.01 (ref 1.005–1.030)
pH: 6 (ref 5.0–8.0)

## 2021-03-02 NOTE — ED Notes (Signed)
Tolerated po trial.

## 2021-03-03 ENCOUNTER — Other Ambulatory Visit (HOSPITAL_BASED_OUTPATIENT_CLINIC_OR_DEPARTMENT_OTHER): Payer: Self-pay

## 2021-03-06 ENCOUNTER — Other Ambulatory Visit (HOSPITAL_BASED_OUTPATIENT_CLINIC_OR_DEPARTMENT_OTHER): Payer: Self-pay

## 2021-03-07 ENCOUNTER — Other Ambulatory Visit (HOSPITAL_BASED_OUTPATIENT_CLINIC_OR_DEPARTMENT_OTHER): Payer: Self-pay

## 2021-03-12 ENCOUNTER — Other Ambulatory Visit (HOSPITAL_BASED_OUTPATIENT_CLINIC_OR_DEPARTMENT_OTHER): Payer: Self-pay

## 2021-03-12 ENCOUNTER — Other Ambulatory Visit (HOSPITAL_BASED_OUTPATIENT_CLINIC_OR_DEPARTMENT_OTHER): Payer: Self-pay | Admitting: Family Medicine

## 2021-03-12 MED FILL — Levothyroxine Sodium Tab 100 MCG: ORAL | 30 days supply | Qty: 30 | Fill #1 | Status: AC

## 2021-03-13 ENCOUNTER — Other Ambulatory Visit (HOSPITAL_BASED_OUTPATIENT_CLINIC_OR_DEPARTMENT_OTHER): Payer: Self-pay

## 2021-03-14 ENCOUNTER — Other Ambulatory Visit (HOSPITAL_BASED_OUTPATIENT_CLINIC_OR_DEPARTMENT_OTHER): Payer: Self-pay

## 2021-03-14 MED ORDER — ZOLPIDEM TARTRATE 10 MG PO TABS
ORAL_TABLET | ORAL | 2 refills | Status: DC
  Filled 2021-03-14: qty 30, 30d supply, fill #0

## 2021-03-15 ENCOUNTER — Other Ambulatory Visit (HOSPITAL_BASED_OUTPATIENT_CLINIC_OR_DEPARTMENT_OTHER): Payer: Self-pay

## 2021-03-15 MED ORDER — PANTOPRAZOLE SODIUM 40 MG PO TBEC
DELAYED_RELEASE_TABLET | ORAL | 0 refills | Status: DC
  Filled 2021-03-15: qty 30, 30d supply, fill #0

## 2021-03-15 MED ORDER — QUETIAPINE FUMARATE 25 MG PO TABS
ORAL_TABLET | ORAL | 5 refills | Status: DC
  Filled 2021-03-15: qty 30, 30d supply, fill #0

## 2021-03-16 ENCOUNTER — Other Ambulatory Visit (HOSPITAL_BASED_OUTPATIENT_CLINIC_OR_DEPARTMENT_OTHER): Payer: Self-pay

## 2021-03-17 ENCOUNTER — Other Ambulatory Visit (HOSPITAL_BASED_OUTPATIENT_CLINIC_OR_DEPARTMENT_OTHER): Payer: Self-pay

## 2021-03-17 DIAGNOSIS — R310 Gross hematuria: Secondary | ICD-10-CM | POA: Diagnosis not present

## 2021-03-17 DIAGNOSIS — N200 Calculus of kidney: Secondary | ICD-10-CM | POA: Diagnosis not present

## 2021-03-17 DIAGNOSIS — Z90710 Acquired absence of both cervix and uterus: Secondary | ICD-10-CM | POA: Diagnosis not present

## 2021-03-17 DIAGNOSIS — Z9049 Acquired absence of other specified parts of digestive tract: Secondary | ICD-10-CM | POA: Diagnosis not present

## 2021-03-17 DIAGNOSIS — N202 Calculus of kidney with calculus of ureter: Secondary | ICD-10-CM | POA: Diagnosis not present

## 2021-03-17 MED ORDER — TRAMADOL HCL 50 MG PO TABS
ORAL_TABLET | ORAL | 0 refills | Status: DC
  Filled 2021-03-17: qty 15, 2d supply, fill #0

## 2021-03-17 MED ORDER — PANTOPRAZOLE SODIUM 40 MG PO TBEC
DELAYED_RELEASE_TABLET | ORAL | 5 refills | Status: DC
  Filled 2021-03-17: qty 30, 30d supply, fill #0

## 2021-03-20 ENCOUNTER — Other Ambulatory Visit (HOSPITAL_BASED_OUTPATIENT_CLINIC_OR_DEPARTMENT_OTHER): Payer: Self-pay

## 2021-03-21 ENCOUNTER — Other Ambulatory Visit (HOSPITAL_BASED_OUTPATIENT_CLINIC_OR_DEPARTMENT_OTHER): Payer: Self-pay

## 2021-03-22 ENCOUNTER — Encounter (HOSPITAL_BASED_OUTPATIENT_CLINIC_OR_DEPARTMENT_OTHER): Payer: Self-pay | Admitting: Urology

## 2021-03-22 ENCOUNTER — Other Ambulatory Visit: Payer: Self-pay | Admitting: Urology

## 2021-03-22 ENCOUNTER — Other Ambulatory Visit: Payer: Self-pay

## 2021-03-22 DIAGNOSIS — N200 Calculus of kidney: Secondary | ICD-10-CM | POA: Diagnosis not present

## 2021-03-22 NOTE — H&P (Signed)
H&P  Chief Complaint: Kidney stone  History of Present Illness: Carmen [name] is a 40 y.o. year old female presents for ureteroscopic mgmt of a symptomatic Rt renal pelvic stone.  Past Medical History:  Diagnosis Date   Back pain    Costochondritis 09/13/2010   GERD (gastroesophageal reflux disease)    Heart palpitations    long history of    Heartburn    History of chest pain    History of fatigue    History of kidney stones    History of migraine headaches    History of tilt table evaluation    IMPRESSION: Positive tilt table study with significant cardioinhibitory  response.   Hyperlipidemia    Hypertension    Hypothyroidism    Kidney stones    history of   migraine    Neurocardiogenic syncope    Palpitations    Pericarditis 09/13/2010   Presence of permanent cardiac pacemaker    PSVT (paroxysmal supraventricular tachycardia) (HCC)    Right ureteral calculus    Sleep apnea    mild osa no cpap needed   Sleeping difficulties    Stress    Stroke (HCC) 06/15/2019   no residual deficits, tpa given at time of stroke   Stroke [facility])    tia jan 2022   SVD (spontaneous vaginal delivery)    x 1   Syncope and collapse    neurocardiogenic syncopy started age 56   Wears glasses     Past Surgical History:  Procedure Laterality Date   ANTERIOR AND POSTERIOR REPAIR N/A 11/14/2017   Procedure: ANTERIOR (CYSTOCELE) AND POSTERIOR REPAIR (RECTOCELE);  Surgeon: Silverio Lay, MD;  Location: [facility];  Service: Gynecology;  Laterality: N/A;   BILATERAL SALPINGECTOMY Bilateral 11/14/2017   Procedure: BILATERAL SALPINGECTOMY;  Surgeon: Silverio Lay, MD;  Location: [facility];  Service: Gynecology;  Laterality: Bilateral;   BLADDER SUSPENSION Bilateral 11/14/2017   Procedure: TRANSVAGINAL TAPE (TVT) PROCEDURE;  Surgeon: Osborn Coho, MD;  Location: [facility];  Service: Gynecology;  Laterality: Bilateral;   CHOLECYSTECTOMY  11/2007   Cholecystitis - Laparoscopic cholecystectomy with  intraoperative  cholangiogram.   CYSTOSCOPY N/A 11/14/2017   Procedure: CYSTOSCOPY;  Surgeon: Osborn Coho, MD;  Location: [facility];  Service: Gynecology;  Laterality: N/A;   CYSTOSCOPY WITH RETROGRADE PYELOGRAM, URETEROSCOPY AND STENT PLACEMENT Right 06/25/2019   Procedure: CYSTOSCOPY WITH RETROGRADE PYELOGRAM, AND RIGHT  STENT PLACEMENT;  Surgeon: Noel Christmas, MD;  Location: [facility];  Service: Urology;  Laterality: Right;   CYSTOSCOPY WITH RETROGRADE PYELOGRAM, URETEROSCOPY AND STENT PLACEMENT Right 07/07/2019   Procedure: CYSTOSCOPY , URETEROSCOPY AND STENT REPLACEMENT;  Surgeon: Noel Christmas, MD;  Location: [facility];  Service: Urology;  Laterality: Right;   HOLMIUM LASER APPLICATION Right 07/07/2019   Procedure: HOLMIUM LASER APPLICATION;  Surgeon: Noel Christmas, MD;  Location: [facility];  Service: Urology;  Laterality: Right;   PACEMAKER INSERTION  2016   Tilt Table Study  07/17/2005   Positive tilt table study with significant cardioinhibitory  response.   VAGINAL HYSTERECTOMY N/A 11/14/2017   Procedure: HYSTERECTOMY VAGINAL;  Surgeon: Silverio Lay, MD;  Location: [facility];  Service: Gynecology;  Laterality: N/A;   WISDOM TOOTH EXTRACTION     age 34    Home Medications:  No medications prior to admission.    Allergies:  Allergies  Allergen Reactions   Tape Hives    Adhesive tape - tegaderm/paper tape ok   Dilaudid [Hydromorphone Hcl] Itching    "  I just about crawled out of my skin"   Prochlorperazine Other (See Comments)    Akathisia with compazine in ED on 05/17/19.  Resolved with repeat dose of benadryl.      Quinolones Nausea Only   Reglan [Metoclopramide] Other (See Comments)    flinging legs and "not feeling right".     Family History  Problem Relation Age of Onset   Hypertension Mother    Heart disease Mother    Cancer Mother        breast   Breast cancer Mother 40   Diabetes Mother    Hyperlipidemia Mother     Thyroid disease Mother    Depression Mother    Anxiety disorder Mother    Bipolar disorder Mother    Liver disease Mother    Alcoholism Mother    Obesity Mother    Heart attack Father    Hyperlipidemia Father    Hepatitis C Father    Diabetes Father    Hypertension Father    Heart disease Father    Sudden Cardiac Death Father    Depression Father    Anxiety disorder Father    Liver disease Father    Alcoholism Father    Drug abuse Father    Obesity Father    Cancer Maternal Aunt        Breast   Breast cancer Maternal Aunt 56   Breast cancer Maternal Grandmother 7    Social History:  reports that she has never smoked. She has never used smokeless tobacco. She reports current alcohol use. She reports that she does not use drugs.  ROS: A complete review of systems was performed.  All systems are negative except for pertinent findings as noted.  Physical Exam:  Vital signs in last 24 hours: Weight:  [85.7 kg] 85.7 kg (09/28 1131) General:  Alert and oriented, No acute distress HEENT: Normocephalic, atraumatic Neck: No JVD or lymphadenopathy Cardiovascular: Regular rate  Lungs: Normal inspiratory/expiratory excursion Abdomen: Soft, nontender, nondistended, no abdominal masses Back: No CVA tenderness Extremities: No edema Neurologic: Grossly intact  Laboratory Data:  No results found for this or any previous visit (from the past 24 hour(s)). No results found for this or any previous visit (from the past 240 hour(s)). Creatinine: No results for input(s): CREATININE in the last 168 hours.  Radiologic Imaging: No results found.  Impression/Assessment:  Rt renal pelvic stone (7 mm)  Plan:  Cysto, Rt RGP, Rt URS, HLL, stent.  Bertram Millard Zaydan Papesh 03/22/2021, 9:03 PM  Bertram Millard. Madysyn Hanken MD

## 2021-03-22 NOTE — Progress Notes (Addendum)
Spoke w/ via phone for pre-op interview---pt Lab needs dos----urine poct I stat               Lab results------see below COVID test -----patient states asymptomatic no test needed Arrive at -------1145 am 03-23-2021 NPO after MN NO Solid Food.  Clear liquids from MN until---1045 am Med rec completed Medications to take morning of surgery -----levothyroxine, diazepam prn, zofran prn, tramadol prn Diabetic medication -----n/a Patient instructed no nail polish to be worn day of surgery Patient instructed to bring photo id and insurance card day of surgery Patient aware to have Driver (ride ) / caregiver   spouse kevin cell [phone] will drop pt off and come back  for 24 hours after surgery  Patient Special Instructions -----none Pre-Op special Istructions -----none Patient verbalized understanding of instructions that were given at this phone interview. Patient denies shortness of breath, chest pain, fever, cough  or any cardiac s & s at this phone interview.   Ekg 07-25-2020 chart/epic Echo 06-15-2021 chart/epic  Requested device orders via fax from [facility], fax confirmation received and placed on chart  Requested last device check note 12-29-2020 from [facility] fax confirmation received and placed on pt chart Addendum" last device check note from 12-29-2020 received via fax from [facility] and placed on pt chart.

## 2021-03-23 ENCOUNTER — Other Ambulatory Visit: Payer: Self-pay

## 2021-03-23 ENCOUNTER — Ambulatory Visit (HOSPITAL_BASED_OUTPATIENT_CLINIC_OR_DEPARTMENT_OTHER): Payer: 59 | Admitting: Anesthesiology

## 2021-03-23 ENCOUNTER — Encounter (HOSPITAL_BASED_OUTPATIENT_CLINIC_OR_DEPARTMENT_OTHER)
Admission: RE | Disposition: A | Discharge status: Home / Self Care | Payer: Self-pay | Source: Home / Self Care | Attending: Urology

## 2021-03-23 ENCOUNTER — Encounter (HOSPITAL_BASED_OUTPATIENT_CLINIC_OR_DEPARTMENT_OTHER): Payer: Self-pay | Admitting: Urology

## 2021-03-23 ENCOUNTER — Other Ambulatory Visit (HOSPITAL_BASED_OUTPATIENT_CLINIC_OR_DEPARTMENT_OTHER): Payer: Self-pay

## 2021-03-23 ENCOUNTER — Ambulatory Visit (HOSPITAL_BASED_OUTPATIENT_CLINIC_OR_DEPARTMENT_OTHER)
Admission: RE | Admit: 2021-03-23 | Discharge: 2021-03-23 | Disposition: A | Discharge status: Home / Self Care | Payer: 59 | Attending: Urology | Admitting: Urology

## 2021-03-23 DIAGNOSIS — Z950 Presence of cardiac pacemaker: Secondary | ICD-10-CM | POA: Diagnosis not present

## 2021-03-23 DIAGNOSIS — Z888 Allergy status to other drugs, medicaments and biological substances status: Secondary | ICD-10-CM | POA: Diagnosis not present

## 2021-03-23 DIAGNOSIS — Z885 Allergy status to narcotic agent status: Secondary | ICD-10-CM | POA: Insufficient documentation

## 2021-03-23 DIAGNOSIS — F418 Other specified anxiety disorders: Secondary | ICD-10-CM | POA: Diagnosis not present

## 2021-03-23 DIAGNOSIS — Z87442 Personal history of urinary calculi: Secondary | ICD-10-CM | POA: Insufficient documentation

## 2021-03-23 DIAGNOSIS — E785 Hyperlipidemia, unspecified: Secondary | ICD-10-CM | POA: Diagnosis not present

## 2021-03-23 DIAGNOSIS — N200 Calculus of kidney: Secondary | ICD-10-CM | POA: Diagnosis not present

## 2021-03-23 DIAGNOSIS — I10 Essential (primary) hypertension: Secondary | ICD-10-CM | POA: Diagnosis not present

## 2021-03-23 HISTORY — DX: Presence of spectacles and contact lenses: Z97.3

## 2021-03-23 HISTORY — PX: CYSTOSCOPY WITH RETROGRADE PYELOGRAM, URETEROSCOPY AND STENT PLACEMENT: SHX5789

## 2021-03-23 HISTORY — PX: HOLMIUM LASER APPLICATION: SHX5852

## 2021-03-23 LAB — POCT I-STAT, CHEM 8
BUN: 10 mg/dL (ref 6–20)
Calcium, Ion: 1.12 mmol/L — ABNORMAL LOW (ref 1.15–1.40)
Chloride: 107 mmol/L (ref 98–111)
Creatinine, Ser: 0.7 mg/dL (ref 0.44–1.00)
Glucose, Bld: 84 mg/dL (ref 70–99)
HCT: 39 % (ref 36.0–46.0)
Hemoglobin: 13.3 g/dL (ref 12.0–15.0)
Potassium: 5.2 mmol/L — ABNORMAL HIGH (ref 3.5–5.1)
Sodium: 140 mmol/L (ref 135–145)
TCO2: 25 mmol/L (ref 22–32)

## 2021-03-23 SURGERY — CYSTOURETEROSCOPY, WITH RETROGRADE PYELOGRAM AND STENT INSERTION
Anesthesia: General | Site: Renal | Laterality: Right

## 2021-03-23 MED ORDER — ONDANSETRON HCL 4 MG/2ML IJ SOLN: 4.0000 mg | Freq: Once | INTRAMUSCULAR | Status: DC | PRN

## 2021-03-23 MED ORDER — MIDAZOLAM HCL 2 MG/2ML IJ SOLN
INTRAMUSCULAR | Status: DC | PRN
  Administered 2021-03-23: 2 mg via INTRAVENOUS

## 2021-03-23 MED ORDER — ACETAMINOPHEN 500 MG PO TABS
ORAL_TABLET | ORAL | Status: AC
  Filled 2021-03-23: qty 2

## 2021-03-23 MED ORDER — SODIUM CHLORIDE 0.9 % IR SOLN
Status: DC | PRN
  Administered 2021-03-23: 1500 mL

## 2021-03-23 MED ORDER — IOHEXOL 300 MG/ML  SOLN
INTRAMUSCULAR | Status: DC | PRN
  Administered 2021-03-23: 10 mL via URETHRAL

## 2021-03-23 MED ORDER — LIDOCAINE HCL (PF) 2 % IJ SOLN
INTRAMUSCULAR | Status: AC
  Filled 2021-03-23: qty 5

## 2021-03-23 MED ORDER — FENTANYL CITRATE (PF) 100 MCG/2ML IJ SOLN
INTRAMUSCULAR | Status: DC | PRN
  Administered 2021-03-23 (×2): 50 ug via INTRAVENOUS

## 2021-03-23 MED ORDER — DEXAMETHASONE SODIUM PHOSPHATE 10 MG/ML IJ SOLN
INTRAMUSCULAR | Status: DC | PRN
  Administered 2021-03-23 (×2): 5 mg via INTRAVENOUS

## 2021-03-23 MED ORDER — PROPOFOL 10 MG/ML IV BOLUS
INTRAVENOUS | Status: DC | PRN
  Administered 2021-03-23: 200 mg via INTRAVENOUS

## 2021-03-23 MED ORDER — SCOPOLAMINE 1 MG/3DAYS TD PT72: 1.0000 | MEDICATED_PATCH | TRANSDERMAL | Status: DC

## 2021-03-23 MED ORDER — MIDAZOLAM HCL 2 MG/2ML IJ SOLN
INTRAMUSCULAR | Status: AC
  Filled 2021-03-23: qty 2

## 2021-03-23 MED ORDER — CEFAZOLIN SODIUM-DEXTROSE 2-4 GM/100ML-% IV SOLN
INTRAVENOUS | Status: AC
  Filled 2021-03-23: qty 100

## 2021-03-23 MED ORDER — KETOROLAC TROMETHAMINE 30 MG/ML IJ SOLN
INTRAMUSCULAR | Status: DC | PRN
  Administered 2021-03-23: 30 mg via INTRAVENOUS

## 2021-03-23 MED ORDER — LACTATED RINGERS IV SOLN: INTRAVENOUS | Status: DC

## 2021-03-23 MED ORDER — FENTANYL CITRATE (PF) 100 MCG/2ML IJ SOLN
INTRAMUSCULAR | Status: AC
  Filled 2021-03-23: qty 2

## 2021-03-23 MED ORDER — OXYBUTYNIN CHLORIDE 5 MG PO TABS
5.0000 mg | ORAL_TABLET | Freq: Three times a day (TID) | ORAL | 1 refills | Status: DC | PRN
  Filled 2021-03-23: qty 15, 5d supply, fill #0

## 2021-03-23 MED ORDER — OXYCODONE HCL 5 MG/5ML PO SOLN: 5.0000 mg | Freq: Once | ORAL | Status: AC | PRN

## 2021-03-23 MED ORDER — DEXAMETHASONE SODIUM PHOSPHATE 10 MG/ML IJ SOLN
INTRAMUSCULAR | Status: AC
  Filled 2021-03-23: qty 1

## 2021-03-23 MED ORDER — AMISULPRIDE (ANTIEMETIC) 5 MG/2ML IV SOLN: 10.0000 mg | Freq: Once | INTRAVENOUS | Status: DC | PRN

## 2021-03-23 MED ORDER — ONDANSETRON HCL 4 MG/2ML IJ SOLN
INTRAMUSCULAR | Status: AC
  Filled 2021-03-23: qty 2

## 2021-03-23 MED ORDER — ONDANSETRON HCL 4 MG/2ML IJ SOLN
INTRAMUSCULAR | Status: DC | PRN
Start: 2021-03-23 — End: 2021-03-23
  Administered 2021-03-23: 4 mg via INTRAVENOUS

## 2021-03-23 MED ORDER — OXYCODONE HCL 5 MG PO TABS
ORAL_TABLET | ORAL | Status: AC
  Filled 2021-03-23: qty 1

## 2021-03-23 MED ORDER — FENTANYL CITRATE (PF) 100 MCG/2ML IJ SOLN
25.0000 ug | INTRAMUSCULAR | Status: DC | PRN
  Administered 2021-03-23 (×2): 25 ug via INTRAVENOUS

## 2021-03-23 MED ORDER — KETOROLAC TROMETHAMINE 30 MG/ML IJ SOLN
INTRAMUSCULAR | Status: AC
  Filled 2021-03-23: qty 1

## 2021-03-23 MED ORDER — ACETAMINOPHEN 500 MG PO TABS
1000.0000 mg | ORAL_TABLET | Freq: Once | ORAL | Status: AC
  Administered 2021-03-23: 1000 mg via ORAL

## 2021-03-23 MED ORDER — CEFAZOLIN SODIUM-DEXTROSE 2-4 GM/100ML-% IV SOLN
2.0000 g | INTRAVENOUS | Status: AC
  Administered 2021-03-23: 2 g via INTRAVENOUS

## 2021-03-23 MED ORDER — CEPHALEXIN 500 MG PO CAPS
500.0000 mg | ORAL_CAPSULE | Freq: Two times a day (BID) | ORAL | 0 refills | Status: AC
  Filled 2021-03-23: qty 6, 3d supply, fill #0

## 2021-03-23 MED ORDER — LIDOCAINE 2% (20 MG/ML) 5 ML SYRINGE
INTRAMUSCULAR | Status: DC | PRN
Start: 2021-03-23 — End: 2021-03-23
  Administered 2021-03-23: 80 mg via INTRAVENOUS

## 2021-03-23 MED ORDER — OXYCODONE HCL 5 MG PO TABS
5.0000 mg | ORAL_TABLET | Freq: Once | ORAL | Status: AC | PRN
  Administered 2021-03-23: 5 mg via ORAL

## 2021-03-23 SURGICAL SUPPLY — 26 items
BAG DRAIN URO-CYSTO SKYTR STRL (DRAIN) ×3 IMPLANT
BAG DRN UROCATH (DRAIN) ×2
BASKET ZERO TIP NITINOL 2.4FR (BASKET) IMPLANT
BSKT STON RTRVL ZERO TP 2.4FR (BASKET)
CATH INTERMIT  6FR 70CM (CATHETERS) ×3 IMPLANT
CLOTH BEACON ORANGE TIMEOUT ST (SAFETY) ×3 IMPLANT
COVER DOME SNAP 22 D (MISCELLANEOUS) ×3 IMPLANT
ELECT REM PT RETURN 9FT ADLT (ELECTROSURGICAL)
ELECTRODE REM PT RTRN 9FT ADLT (ELECTROSURGICAL) IMPLANT
FIBER LASER FLEXIVA 200 (UROLOGICAL SUPPLIES) IMPLANT
FIBER LASER FLEXIVA 365 (UROLOGICAL SUPPLIES) ×1 IMPLANT
GLOVE SURG ENC MOIS LTX SZ8 (GLOVE) ×3 IMPLANT
GOWN STRL REUS W/TWL LRG LVL3 (GOWN DISPOSABLE) ×3 IMPLANT
GUIDEWIRE ANG ZIPWIRE 038X150 (WIRE) IMPLANT
GUIDEWIRE STR DUAL SENSOR (WIRE) IMPLANT
IV NS IRRIG 3000ML ARTHROMATIC (IV SOLUTION) ×5 IMPLANT
KIT TURNOVER CYSTO (KITS) ×3 IMPLANT
MANIFOLD NEPTUNE II (INSTRUMENTS) ×3 IMPLANT
NS IRRIG 500ML POUR BTL (IV SOLUTION) ×3 IMPLANT
PACK CYSTO (CUSTOM PROCEDURE TRAY) ×3 IMPLANT
SHEATH URETERAL 12FRX35CM (MISCELLANEOUS) ×1 IMPLANT
STENT URET 6FRX24 CONTOUR (STENTS) ×1 IMPLANT
TRACTIP FLEXIVA PULS ID 200XHI (Laser) IMPLANT
TRACTIP FLEXIVA PULSE ID 200 (Laser)
TUBE CONNECTING 12X1/4 (SUCTIONS) ×1 IMPLANT
TUBING UROLOGY SET (TUBING) ×1 IMPLANT

## 2021-03-23 NOTE — Transfer of Care (Signed)
Immediate Anesthesia Transfer of Care Note  Patient: Carmen [name]  Procedure(s) Performed: Procedure(s) (LRB): CYSTOSCOPY WITH RETROGRADE PYELOGRAM, URETEROSCOPY AND STENT PLACEMENT (Right) HOLMIUM LASER APPLICATION (Right)  Patient Location: PACU  Anesthesia Type: General  Level of Consciousness: awake, oriented, sedated and patient cooperative  Airway & Oxygen Therapy: Patient Spontanous Breathing and Patient connected to face mask oxygen  Post-op Assessment: Report given to PACU RN and Post -op Vital signs reviewed and stable  Post vital signs: Reviewed and stable  Complications: No apparent anesthesia complications Last Vitals:  Vitals Value Taken Time  BP 144/90 03/23/21 1404  Temp    Pulse 88 03/23/21 1406  Resp 11 03/23/21 1406  SpO2 100 % 03/23/21 1406  Vitals shown include unvalidated device data.  Last Pain:  Vitals:   03/23/21 1106  TempSrc:   PainSc: 0-No pain      Patients Stated Pain Goal: 4 (03/23/21 1106)  Complications: No notable events documented.

## 2021-03-23 NOTE — Anesthesia Procedure Notes (Signed)
Procedure Name: LMA Insertion Date/Time: 03/23/2021 12:29 PM Performed by: Francie Massing, CRNA Pre-anesthesia Checklist: Patient identified, Emergency Drugs available, Suction available and Patient being monitored Patient Re-evaluated:Patient Re-evaluated prior to induction Oxygen Delivery Method: Circle system utilized Preoxygenation: Pre-oxygenation with 100% oxygen Induction Type: IV induction Ventilation: Mask ventilation without difficulty LMA: LMA inserted LMA Size: 4.0 Number of attempts: 1 Airway Equipment and Method: Bite block Placement Confirmation: positive ETCO2 Tube secured with: Tape Dental Injury: Teeth and Oropharynx as per pre-operative assessment

## 2021-03-23 NOTE — Discharge Instructions (Addendum)
You may see some blood in the urine and may have some burning with urination for 48-72 hours. You also may notice that you have to urinate more frequently or urgently after your procedure which is normal.  You should call should you develop an inability urinate, fever > 101, persistent nausea and vomiting that prevents you from eating or drinking to stay hydrated.  If you have a stent, you will likely urinate more frequently and urgently until the stent is removed and you may experience some discomfort/pain in the lower abdomen and flank especially when urinating. You may take pain medication prescribed to you if needed for pain. You may also intermittently have blood in the urine until the stent is removed. Pull thread to remove stent on Monday mornning  [facility] [phone] Post Ureteroscopy With or Without Stent Instructions  Definitions:  Ureter: The duct that transports urine from the kidney to the bladder. Stent:   A plastic hollow tube that is placed into the ureter, from the kidney to the bladder to prevent the ureter from swelling shut.  GENERAL INSTRUCTIONS:  Despite the fact that no skin incisions were used, the area around the ureter and bladder is raw and irritated. The stent is a foreign body which will further irritate the bladder wall. This irritation is manifested by increased frequency of urination, both day and night, and by an increase in the urge to urinate. In some, the urge to urinate is present almost always. Sometimes the urge is strong enough that you may not be able to stop yourself from urinating. The only real cure is to remove the stent and then give time for the bladder wall to heal which can't be done until the danger of the ureter swelling shut has passed, which varies.  You may see some blood in your urine while the stent is in place and a few days afterwards. Do not be alarmed, even if the urine was clear for a while. Get off your feet and  drink lots of fluids until clearing occurs. If you start to pass clots or don't improve, call us.  DIET: You may return to your normal diet immediately. Because of the raw surface of your bladder, alcohol, spicy foods, acid type foods and drinks with caffeine may cause irritation or frequency and should be used in moderation. To keep your urine flowing freely and to avoid constipation, drink plenty of fluids during the day ( 8-10 glasses ). Tip: Avoid cranberry juice because it is very acidic.  ACTIVITY: Your physical activity doesn't need to be restricted. However, if you are very active, you may see some blood in your urine. We suggest that you reduce your activity under these circumstances until the bleeding has stopped.  BOWELS: It is important to keep your bowels regular during the postoperative period. Straining with bowel movements can cause bleeding. A bowel movement every other day is reasonable. Use a mild laxative if needed, such as Milk of Magnesia 2-3 tablespoons, or 2 Dulcolax tablets. Call if you continue to have problems. If you have been taking narcotics for pain, before, during or after your surgery, you may be constipated. Take a laxative if necessary.   MEDICATION: You should resume your pre-surgery medications unless told not to. In addition you will often be given an antibiotic to prevent infection. These should be taken as prescribed until the bottles are finished unless you are having an unusual reaction to one of the drugs.  PROBLEMS YOU SHOULD REPORT  TO [place]: Fevers over 100.5 Fahrenheit. Heavy bleeding, or clots ( See above notes about blood in urine ). Inability to urinate. Drug reactions ( hives, rash, nausea, vomiting, diarrhea ). Severe burning or pain with urination that is not improving.  FOLLOW-UP: You will need a follow-up appointment to monitor your progress. Call for this appointment at the number listed above. Usually the first appointment will be about  three to fourteen days after your surgery.   Post Anesthesia Home Care Instructions  Activity: Get plenty of rest for the remainder of the day. A responsible individual must stay with you for 24 hours following the procedure.  For the next 24 hours, DO NOT: -Drive a car -Advertising copywriter -Drink alcoholic beverages -Take any medication unless instructed by your physician -Make any legal decisions or sign important papers.  Meals: Start with liquid foods such as gelatin or soup. Progress to regular foods as tolerated. Avoid greasy, spicy, heavy foods. If nausea and/or vomiting occur, drink only clear liquids until the nausea and/or vomiting subsides. Call your physician if vomiting continues.  Special Instructions/Symptoms: Your throat may feel dry or sore from the anesthesia or the breathing tube placed in your throat during surgery. If this causes discomfort, gargle with warm salt water. The discomfort should disappear within 24 hours.  No ibuprofen, Advil, Aleve, Motrin, ketorolac, meloxicam, or naproxen until after 7:45 pm today if needed. No Tylenol/acetaminophen until after 4:45 pm today.

## 2021-03-23 NOTE — Interval H&P Note (Signed)
History and Physical Interval Note:  03/23/2021 12:57 PM  Carmen [name]  has presented today for surgery, with the diagnosis of RENAL STONES.  The various methods of treatment have been discussed with the patient and family. After consideration of risks, benefits and other options for treatment, the patient has consented to  Procedure(s): CYSTOSCOPY WITH RETROGRADE PYELOGRAM, URETEROSCOPY AND STENT PLACEMENT (Right) HOLMIUM LASER APPLICATION (Right) as a surgical intervention.  The patient's history has been reviewed, patient examined, no change in status, stable for surgery.  I have reviewed the patient's chart and labs.  Questions were answered to the patient's satisfaction.     Carmen [name]

## 2021-03-23 NOTE — Op Note (Signed)
Preoperative diagnosis: Symptomatic right renal pelvic stone, 7 mm  Postoperative diagnosis: Same  And procedure: Cystoscopy, right retrograde ureteropyelogram, fluoroscopic interpretation, right ureteroscopy, holmium laser fragmentation/dusting of right renal pelvic stone, placement of 6 French by 24 cm contour double-J stent with tether  Surgeon: Taran Hable  Anesthesia: General with LMA  Complications: None  Specimen: None  Estimated blood loss: None  Indications: 40 year old female with history of urolithiasis, multiple left-sided stones which are asymptomatic but she is recently presented with a symptomatic right renal pelvic stone measuring approximately 7 mm in size.  She has intermittent flank pain and gross hematuria.  She has requested intervention.  We have spoken with her regarding choosing between shockwave lithotripsy and ureteroscopic management.  We have chosen direct ureteroscopy with laser of the stone.  She has had this procedure in the past.  We have discussed risk, complications and expected outcomes.  She understands these and desires to proceed.  Findings: Urethra and bladder were normal.  Normally placed ureteral orifices.  Study of the right ureter with retrograde pyelogram using Omnipaque revealed a normal right ureter.  Renal pelvis was normal except for a small filling defect in the lateral aspect consistent with the stone.  Pyelocalyceal system was normal otherwise.  Description of procedure: The patient was properly identified and marked in the holding area.  She received preoperative IV antibiotics and was taken to the operating room where general anesthetic was administered with the LMA.  She was placed in the dorsolithotomy position.  Genitalia and perineum were prepped, draped, proper timeout performed.  21 French panendoscope advanced into the bladder with the above-mentioned findings.  Using a 6 [place] open-ended catheter right retrograde ureteropyelogram was  performed with Omnipaque.  The above-mentioned findings were noted.  Following this, sensor tip guidewire was advanced through the open-ended catheter and passed proximally until a curl was seen in the upper pole calyces.  Open-ended catheter removed.  Ureter sequentially dilated first with the 12 French ureteral access sheath core and then the entire 12/14 sheath.  I then remove the obturator and passed a flexible dual-lumen ureteroscope.  Pyelocalyceal system was inspected.  There was only one solitary stone noted.  365 m fiber was utilized to apply laser energy with settings of 53 Hz and 0.5 J.  The stone was dusted.  No significant fragments larger than the grain of sand were resulted.  After treatment, all calyces were inspected.  No significant fragments or stone material was noted in any of the other calyces.  The ureteroscope was removed.  Guidewire replaced, ureteral access catheter removed.  Guidewire was backloaded through the cystoscope and using cystoscopic and fluoroscopic guidance a 6 French by 24 cm contour double-J stent was placed within the right ureter with excellent proximal and distal curl seen once the guidewire was removed.  The bladder was drained.  The tether was left on the distal end of the stent.  Scope was removed.  Tether was then tied in a knot right outside the urethral orifice, the tether was trimmed and then tucked within the vagina.  At this point the procedure was terminated.  The patient was awakened, taken to PACU in stable condition.  She tolerated procedure well

## 2021-03-23 NOTE — Anesthesia Postprocedure Evaluation (Signed)
Anesthesia Post Note  Patient: Carmen [name]  Procedure(s) Performed: CYSTOSCOPY WITH RETROGRADE PYELOGRAM, URETEROSCOPY AND STENT PLACEMENT (Right: Renal) HOLMIUM LASER APPLICATION (Right: Pelvis)     Patient location during evaluation: PACU Anesthesia Type: General Level of consciousness: awake and alert Pain management: pain level controlled Vital Signs Assessment: post-procedure vital signs reviewed and stable Respiratory status: spontaneous breathing, nonlabored ventilation and respiratory function stable Cardiovascular status: blood pressure returned to baseline and stable Postop Assessment: no apparent nausea or vomiting Anesthetic complications: no   No notable events documented.  Last Vitals:  Vitals:   03/23/21 1415 03/23/21 1430  BP: (!) 137/108 (!) 148/97  Pulse: 85 85  Resp: 13 16  Temp:    SpO2: 100% 99%    Last Pain:  Vitals:   03/23/21 1404  TempSrc:   PainSc: 0-No pain                 Mellody Dance

## 2021-03-23 NOTE — Anesthesia Preprocedure Evaluation (Addendum)
Anesthesia Evaluation  Patient identified by MRN, date of birth, ID band Patient awake    Reviewed: Allergy & Precautions, NPO status , Patient's Chart, lab work & pertinent test results  Airway Mallampati: II  TM Distance: >3 FB Neck ROM: Full    Dental  (+) Dental Advisory Given   Pulmonary sleep apnea ,    breath sounds clear to auscultation       Cardiovascular hypertension, Pt. on medications and Pt. on home beta blockers + pacemaker  Rhythm:Regular Rate:Normal     Neuro/Psych  Headaches, PSYCHIATRIC DISORDERS Anxiety Depression CVA    GI/Hepatic Neg liver ROS, GERD  ,  Endo/Other  Hypothyroidism Morbid obesity  Renal/GU Renal disease     Musculoskeletal negative musculoskeletal ROS (+)   Abdominal   Peds negative pediatric ROS (+)  Hematology negative hematology ROS (+)   Anesthesia Other Findings   Reproductive/Obstetrics negative OB ROS                             Anesthesia Physical  Anesthesia Plan  ASA: 3  Anesthesia Plan: General   Post-op Pain Management:    Induction: Intravenous  PONV Risk Score and Plan: 3 and Dexamethasone, Ondansetron and Treatment may vary due to age or medical condition  Airway Management Planned: LMA  Additional Equipment: None  Intra-op Plan:   Post-operative Plan: Extubation in OR  Informed Consent: I have reviewed the patients History and Physical, chart, labs and discussed the procedure including the risks, benefits and alternatives for the proposed anesthesia with the patient or authorized representative who has indicated his/her understanding and acceptance.     Dental advisory given  Plan Discussed with: CRNA  Anesthesia Plan Comments: (Reviewed pacemaker report. Battery life is 5 years and lower limit set at 70. Patient took phenergan last night and is feeling a little "loopy" today. GA/LMA. No current nausea/vomiting.  Tanna Furry, MD  )       Anesthesia Quick Evaluation

## 2021-03-24 ENCOUNTER — Encounter (HOSPITAL_BASED_OUTPATIENT_CLINIC_OR_DEPARTMENT_OTHER): Payer: Self-pay | Admitting: Urology

## 2021-03-24 ENCOUNTER — Other Ambulatory Visit (HOSPITAL_BASED_OUTPATIENT_CLINIC_OR_DEPARTMENT_OTHER): Payer: Self-pay

## 2021-03-26 ENCOUNTER — Emergency Department (HOSPITAL_COMMUNITY)
Admission: EM | Admit: 2021-03-26 | Discharge: 2021-03-26 | Disposition: A | Discharge status: Home / Self Care | Payer: 59 | Attending: Emergency Medicine | Admitting: Emergency Medicine

## 2021-03-26 ENCOUNTER — Emergency Department (HOSPITAL_BASED_OUTPATIENT_CLINIC_OR_DEPARTMENT_OTHER): Payer: 59

## 2021-03-26 ENCOUNTER — Emergency Department (HOSPITAL_BASED_OUTPATIENT_CLINIC_OR_DEPARTMENT_OTHER)
Admission: EM | Admit: 2021-03-26 | Discharge: 2021-03-26 | Disposition: A | Discharge status: Home / Self Care | Payer: 59 | Attending: Emergency Medicine | Admitting: Emergency Medicine

## 2021-03-26 ENCOUNTER — Encounter (HOSPITAL_BASED_OUTPATIENT_CLINIC_OR_DEPARTMENT_OTHER): Payer: Self-pay | Admitting: Emergency Medicine

## 2021-03-26 ENCOUNTER — Other Ambulatory Visit: Payer: Self-pay

## 2021-03-26 DIAGNOSIS — R109 Unspecified abdominal pain: Secondary | ICD-10-CM | POA: Diagnosis present

## 2021-03-26 DIAGNOSIS — I10 Essential (primary) hypertension: Secondary | ICD-10-CM | POA: Diagnosis not present

## 2021-03-26 DIAGNOSIS — Z90710 Acquired absence of both cervix and uterus: Secondary | ICD-10-CM | POA: Diagnosis not present

## 2021-03-26 DIAGNOSIS — Z466 Encounter for fitting and adjustment of urinary device: Secondary | ICD-10-CM | POA: Diagnosis not present

## 2021-03-26 DIAGNOSIS — Z950 Presence of cardiac pacemaker: Secondary | ICD-10-CM | POA: Insufficient documentation

## 2021-03-26 DIAGNOSIS — Z79899 Other long term (current) drug therapy: Secondary | ICD-10-CM | POA: Insufficient documentation

## 2021-03-26 DIAGNOSIS — N132 Hydronephrosis with renal and ureteral calculous obstruction: Secondary | ICD-10-CM | POA: Diagnosis not present

## 2021-03-26 DIAGNOSIS — N23 Unspecified renal colic: Secondary | ICD-10-CM | POA: Insufficient documentation

## 2021-03-26 DIAGNOSIS — Z9049 Acquired absence of other specified parts of digestive tract: Secondary | ICD-10-CM | POA: Diagnosis not present

## 2021-03-26 DIAGNOSIS — Z5321 Procedure and treatment not carried out due to patient leaving prior to being seen by health care provider: Secondary | ICD-10-CM | POA: Insufficient documentation

## 2021-03-26 DIAGNOSIS — E039 Hypothyroidism, unspecified: Secondary | ICD-10-CM | POA: Diagnosis not present

## 2021-03-26 LAB — URINALYSIS, MICROSCOPIC (REFLEX): RBC / HPF: >50 RBC/hpf (ref 0–5)

## 2021-03-26 LAB — BASIC METABOLIC PANEL
Anion gap: 8 (ref 5–15)
BUN: 12 mg/dL (ref 6–20)
CO2: 24 mmol/L (ref 22–32)
Calcium: 8.8 mg/dL — ABNORMAL LOW (ref 8.9–10.3)
Chloride: 104 mmol/L (ref 98–111)
Creatinine, Ser: 1.05 mg/dL — ABNORMAL HIGH (ref 0.44–1.00)
GFR, Estimated: >60 mL/min (ref >60)
Glucose, Bld: 92 mg/dL (ref 70–99)
Potassium: 3.7 mmol/L (ref 3.5–5.1)
Sodium: 136 mmol/L (ref 135–145)

## 2021-03-26 LAB — CBC WITH DIFFERENTIAL/PLATELET
Abs Immature Granulocytes: 0.03 10*3/uL (ref 0.00–0.07)
Basophils Absolute: 0 10*3/uL (ref 0.0–0.1)
Basophils Relative: 0 %
Eosinophils Absolute: 0.2 10*3/uL (ref 0.0–0.5)
Eosinophils Relative: 2 %
HCT: 37.1 % (ref 36.0–46.0)
Hemoglobin: 13 g/dL (ref 12.0–15.0)
Immature Granulocytes: 0 %
Lymphocytes Relative: 15 %
Lymphs Abs: 1.4 10*3/uL (ref 0.7–4.0)
MCH: 31 pg (ref 26.0–34.0)
MCHC: 35 g/dL (ref 30.0–36.0)
MCV: 88.5 fL (ref 80.0–100.0)
Monocytes Absolute: 0.7 10*3/uL (ref 0.1–1.0)
Monocytes Relative: 7 %
Neutro Abs: 7 10*3/uL (ref 1.7–7.7)
Neutrophils Relative %: 76 %
Platelets: 226 10*3/uL (ref 150–400)
RBC: 4.19 MIL/uL (ref 3.87–5.11)
RDW: 12 % (ref 11.5–15.5)
WBC: 9.3 10*3/uL (ref 4.0–10.5)
nRBC: 0 % (ref 0.0–0.2)

## 2021-03-26 LAB — URINALYSIS, ROUTINE W REFLEX MICROSCOPIC
Bilirubin Urine: NEGATIVE
Glucose, UA: NEGATIVE mg/dL
Ketones, ur: NEGATIVE mg/dL
Nitrite: NEGATIVE
Protein, ur: NEGATIVE mg/dL
Specific Gravity, Urine: 1.02 (ref 1.005–1.030)
pH: 6.5 (ref 5.0–8.0)

## 2021-03-26 MED ORDER — OXYCODONE-ACETAMINOPHEN 5-325 MG PO TABS: 1.0000 | ORAL_TABLET | Freq: Four times a day (QID) | ORAL | 0 refills | Status: DC | PRN

## 2021-03-26 MED ORDER — KETOROLAC TROMETHAMINE 30 MG/ML IJ SOLN
30.0000 mg | Freq: Once | INTRAMUSCULAR | Status: AC
  Administered 2021-03-26: 30 mg via INTRAVENOUS
  Filled 2021-03-26: qty 1

## 2021-03-26 MED ORDER — MORPHINE SULFATE (PF) 4 MG/ML IV SOLN
4.0000 mg | Freq: Once | INTRAVENOUS | Status: AC
Start: 2021-03-26 — End: 2021-03-26
  Administered 2021-03-26: 4 mg via INTRAVENOUS
  Filled 2021-03-26: qty 1

## 2021-03-26 MED ORDER — ONDANSETRON HCL 4 MG/2ML IJ SOLN
4.0000 mg | Freq: Once | INTRAMUSCULAR | Status: AC
  Administered 2021-03-26: 4 mg via INTRAVENOUS
  Filled 2021-03-26: qty 2

## 2021-03-26 MED ORDER — TAMSULOSIN HCL 0.4 MG PO CAPS: 0.4000 mg | ORAL_CAPSULE | Freq: Every day | ORAL | 0 refills | Status: DC

## 2021-03-26 MED ORDER — SODIUM CHLORIDE 0.9 % IV BOLUS
1000.0000 mL | Freq: Once | INTRAVENOUS | Status: AC
  Administered 2021-03-26: 1000 mL via INTRAVENOUS

## 2021-03-26 MED ORDER — ONDANSETRON 4 MG PO TBDP: 4.0000 mg | ORAL_TABLET | ORAL | 0 refills | Status: DC

## 2021-03-26 NOTE — ED Provider Notes (Signed)
[facility] EMERGENCY DEPARTMENT Provider Note   CSN: [id number] Arrival date & time: 03/26/21  1916     History Chief Complaint  Patient presents with   Flank Pain    Carmen [name] is a 40 y.o. female.  She had a ureteral stent placed by Dr. Retta Diones 4 days ago.  The stent started coming out yesterday and she was instructed to pull it.  She has had increased right flank pain since then.  Associated with nausea although she is not sure if it is from the pain medication.  She has had hematuria with the stent but now is having more yellow urine but her urine is decreased.  No recent fever and is on antibiotics for the stent.  Was instructed by urology to come get a CAT scan.  The history is provided by the patient.  Flank Pain This is a recurrent problem. The current episode started 6 to 12 hours ago. The problem occurs constantly. The problem has not changed since onset.Associated symptoms include abdominal pain. Pertinent negatives include no chest pain, no headaches and no shortness of breath. Nothing aggravates the symptoms. Nothing relieves the symptoms. She has tried rest for the symptoms. The treatment provided no relief.      Past Medical History:  Diagnosis Date   Back pain    Costochondritis 09/13/2010   GERD (gastroesophageal reflux disease)    Heart palpitations    long history of    Heartburn    History of chest pain    History of fatigue    History of kidney stones    History of migraine headaches    History of tilt table evaluation    IMPRESSION: Positive tilt table study with significant cardioinhibitory  response.   Hyperlipidemia    Hypertension    Hypothyroidism    Kidney stones    history of   migraine    Neurocardiogenic syncope    Palpitations    Pericarditis 09/13/2010   Presence of permanent cardiac pacemaker    PSVT (paroxysmal supraventricular tachycardia) (HCC)    Right ureteral calculus    Sleep apnea    mild osa no cpap needed    Sleeping difficulties    Stress    Stroke (HCC) 06/15/2019   no residual deficits, tpa given at time of stroke   Stroke [facility])    tia jan 2022   SVD (spontaneous vaginal delivery)    x 1   Syncope and collapse    neurocardiogenic syncopy started age 34   Wears glasses     Patient Active Problem List   Diagnosis Date Noted   Acute right-sided weakness 07/25/2020   Depression with anxiety 07/25/2020   Hypokalemia 07/25/2020   Mood disorder (HCC) 07/07/2020   Other fatigue 07/07/2020   SOBOE (shortness of breath on exertion) 07/07/2020   Essential hypertension 07/07/2020   Hypothyroidism 07/07/2020   Migraine 07/07/2020   Vitamin D deficiency 07/07/2020   At risk for impaired metabolic function 07/07/2020   Hyperlipidemia 06/18/2019   Stroke-like episode s/p tPA 06/15/2019   Pelvic prolapse 11/17/2017   Incontinence of urine in female 11/14/2017   PSVT (paroxysmal supraventricular tachycardia) (HCC)    Heart palpitations    Syncope and collapse    Sleeping difficulties    Pericarditis 09/13/2010   Syncope 09/13/2010   Costochondritis 09/13/2010    Past Surgical History:  Procedure Laterality Date   ANTERIOR AND POSTERIOR REPAIR N/A 11/14/2017   Procedure: ANTERIOR (CYSTOCELE) AND POSTERIOR REPAIR (  RECTOCELE);  Surgeon: Silverio Lay, MD;  Location: [facility];  Service: Gynecology;  Laterality: N/A;   BILATERAL SALPINGECTOMY Bilateral 11/14/2017   Procedure: BILATERAL SALPINGECTOMY;  Surgeon: Silverio Lay, MD;  Location: [facility];  Service: Gynecology;  Laterality: Bilateral;   BLADDER SUSPENSION Bilateral 11/14/2017   Procedure: TRANSVAGINAL TAPE (TVT) PROCEDURE;  Surgeon: Osborn Coho, MD;  Location: [facility];  Service: Gynecology;  Laterality: Bilateral;   CHOLECYSTECTOMY  11/2007   Cholecystitis - Laparoscopic cholecystectomy with intraoperative  cholangiogram.   CYSTOSCOPY N/A 11/14/2017   Procedure: CYSTOSCOPY;  Surgeon: Osborn Coho, MD;  Location: [facility];   Service: Gynecology;  Laterality: N/A;   CYSTOSCOPY WITH RETROGRADE PYELOGRAM, URETEROSCOPY AND STENT PLACEMENT Right 06/25/2019   Procedure: CYSTOSCOPY WITH RETROGRADE PYELOGRAM, AND RIGHT  STENT PLACEMENT;  Surgeon: Noel Christmas, MD;  Location: [facility];  Service: Urology;  Laterality: Right;   CYSTOSCOPY WITH RETROGRADE PYELOGRAM, URETEROSCOPY AND STENT PLACEMENT Right 07/07/2019   Procedure: CYSTOSCOPY , URETEROSCOPY AND STENT REPLACEMENT;  Surgeon: Noel Christmas, MD;  Location: [facility];  Service: Urology;  Laterality: Right;   CYSTOSCOPY WITH RETROGRADE PYELOGRAM, URETEROSCOPY AND STENT PLACEMENT Right 03/23/2021   Procedure: CYSTOSCOPY WITH RETROGRADE PYELOGRAM, URETEROSCOPY AND STENT PLACEMENT;  Surgeon: Marcine Matar, MD;  Location: [facility];  Service: Urology;  Laterality: Right;   HOLMIUM LASER APPLICATION Right 07/07/2019   Procedure: HOLMIUM LASER APPLICATION;  Surgeon: Noel Christmas, MD;  Location: [facility];  Service: Urology;  Laterality: Right;   HOLMIUM LASER APPLICATION Right 03/23/2021   Procedure: HOLMIUM LASER APPLICATION;  Surgeon: Marcine Matar, MD;  Location: [facility];  Service: Urology;  Laterality: Right;   PACEMAKER INSERTION  2016   Tilt Table Study  07/17/2005   Positive tilt table study with significant cardioinhibitory  response.   VAGINAL HYSTERECTOMY N/A 11/14/2017   Procedure: HYSTERECTOMY VAGINAL;  Surgeon: Silverio Lay, MD;  Location: [facility];  Service: Gynecology;  Laterality: N/A;   WISDOM TOOTH EXTRACTION     age 72     OB History     Gravida  2   Para  1   Term  1   Preterm      AB      Living  1      SAB      IAB      Ectopic      Multiple      Live Births              Family History  Problem Relation Age of Onset   Hypertension Mother    Heart disease Mother    Cancer Mother        breast   Breast cancer Mother 75   Diabetes  Mother    Hyperlipidemia Mother    Thyroid disease Mother    Depression Mother    Anxiety disorder Mother    Bipolar disorder Mother    Liver disease Mother    Alcoholism Mother    Obesity Mother    Heart attack Father    Hyperlipidemia Father    Hepatitis C Father    Diabetes Father    Hypertension Father    Heart disease Father    Sudden Cardiac Death Father    Depression Father    Anxiety disorder Father    Liver disease Father    Alcoholism Father    Drug abuse Father    Obesity Father    Cancer Maternal Aunt  Breast   Breast cancer Maternal Aunt 47   Breast cancer Maternal Grandmother 43    Social History   Tobacco Use   Smoking status: Never   Smokeless tobacco: Never  Vaping Use   Vaping Use: Never used  Substance Use Topics   Alcohol use: Yes    Comment: very rare   Drug use: Never    Home Medications Prior to Admission medications   Medication Sig Start Date End Date Taking? Authorizing Provider  atorvastatin (LIPITOR) 80 MG tablet Take one tablet (80 mg dose) by mouth daily. Patient taking differently: 600 pm in evening 11/22/20     cephALEXin (KEFLEX) 500 MG capsule Take 1 capsule (500 mg total) by mouth 2 (two) times daily for 6 doses. 03/23/21 03/26/21  Marcine Matar, MD  clotrimazole-betamethasone (LOTRISONE) cream Apply to the affected and surrounding areas of the skin 2 times daily in the morning and evening for 2 weeks Patient taking differently: Apply topically as needed. 02/20/21     diazepam (VALIUM) 5 MG tablet TAKE 1 TABLET BY MOUTH THREE TIMES DAILY. Patient taking differently: Take 5 mg by mouth as needed. 02/09/21     fenofibrate (TRICOR) 145 MG tablet Take one tablet (145 mg dose) by mouth daily. Patient taking differently: Take by mouth at bedtime. 11/22/20     levothyroxine (SYNTHROID) 100 MCG tablet TAKE 1 TABLET BY MOUTH ONCE DAILY 08/04/20 08/04/21  Marthenia Rolling, MD  metoprolol succinate (TOPROL-XL) 100 MG 24 hr tablet Take one  tablet (100 mg dose) by mouth daily. Patient taking differently: Take by mouth at bedtime. 11/22/20     ondansetron (ZOFRAN-ODT) 4 MG disintegrating tablet Dissolve one tablet by every 8 hours as needed for nausea and vomiting 02/10/20   Sabas Sous, MD  oxybutynin (DITROPAN) 5 MG tablet Take 1 tablet (5 mg total) by mouth every 8 (eight) hours as needed for up to 15 doses for bladder spasms. 03/23/21   Marcine Matar, MD  pantoprazole (PROTONIX) 40 MG tablet Take one tablet (40 mg dose) by mouth daily. Patient taking differently: Take by mouth at bedtime. 03/15/21     Probiotic Product (PROBIOTIC PO) Take 1 capsule by mouth daily.    [provider]  promethazine (PHENERGAN) 25 MG tablet Take 25 mg by mouth every 6 (six) hours as needed for nausea or vomiting.    [provider]  protriptyline (VIVACTIL) 10 MG tablet Take one tablet (10 mg dose) by mouth at bedtime. Patient taking differently: at bedtime. 11/22/20     Rimegepant Sulfate (NURTEC) 75 MG TBDP Dissolve one tablet by mouth as needed. 04/15/20     solifenacin (VESICARE) 10 MG tablet Take one tablet (10 mg dose) by mouth daily. 11/23/20     SUMAtriptan (IMITREX) 100 MG tablet Take 1 tablet by mouth once daily as needed for migraine. May repeat ONCE in 2 hours. 11/04/20     topiramate (TOPAMAX) 50 MG tablet Take 50 mg by mouth 2 (two) times daily. Tapering off last dose 03-22-2021 01/18/20   [provider]  traMADol (ULTRAM) 50 MG tablet Take 1 - 2 tablets by mouth every 6 hours Patient taking differently: every 6 (six) hours as needed. 03/17/21     zolpidem (AMBIEN) 10 MG tablet Take one tablet (10 mg dose) by mouth at bedtime as needed for Sleep Patient taking differently: at bedtime as needed. 03/13/21       Allergies    Tape, Dilaudid [hydromorphone hcl], Prochlorperazine, Quinolones, and Reglan [metoclopramide]  Review of Systems   Review of Systems  Constitutional:  Negative for fever.  HENT:   Negative for sore throat.   Eyes:  Negative for visual disturbance.  Respiratory:  Negative for shortness of breath.   Cardiovascular:  Negative for chest pain.  Gastrointestinal:  Positive for abdominal pain.  Genitourinary:  Positive for flank pain and hematuria. Negative for dysuria.  Musculoskeletal:  Positive for back pain.  Skin:  Negative for rash.  Neurological:  Negative for headaches.   Physical Exam Updated Vital Signs BP (!) 160/115   Pulse 94   Temp 98.1 F (36.7 C) (Oral)   Resp 20   Ht 5' 2.5" (1.588 m)   Wt 87.1 kg   LMP  (LMP Unknown)   SpO2 100%   BMI 34.56 kg/m   Physical Exam Vitals and nursing note reviewed.  Constitutional:      General: She is not in acute distress.    Appearance: Normal appearance. She is well-developed.  HENT:     Head: Normocephalic and atraumatic.  Eyes:     Conjunctiva/sclera: Conjunctivae normal.  Cardiovascular:     Rate and Rhythm: Normal rate and regular rhythm.     Heart sounds: No murmur heard. Pulmonary:     Effort: Pulmonary effort is normal. No respiratory distress.     Breath sounds: Normal breath sounds.  Abdominal:     Palpations: Abdomen is soft.     Tenderness: There is no abdominal tenderness. There is no guarding or rebound.  Musculoskeletal:        General: No deformity or signs of injury. Normal range of motion.     Cervical back: Neck supple.  Skin:    General: Skin is warm and dry.  Neurological:     General: No focal deficit present.     Mental Status: She is alert.    ED Results / Procedures / Treatments   Labs (all labs ordered are listed, but only abnormal results are displayed) Labs Reviewed  BASIC METABOLIC PANEL - Abnormal; Notable for the following components:      Result Value   Creatinine, Ser 1.05 (*)    Calcium 8.8 (*)    All other components within normal limits  URINALYSIS, ROUTINE W REFLEX MICROSCOPIC - Abnormal; Notable for the following components:   Color, Urine STRAW (*)     APPearance HAZY (*)    Hgb urine dipstick LARGE (*)    Leukocytes,Ua TRACE (*)    All other components within normal limits  URINALYSIS, MICROSCOPIC (REFLEX) - Abnormal; Notable for the following components:   Bacteria, UA FEW (*)    All other components within normal limits  CBC WITH DIFFERENTIAL/PLATELET    EKG None  Radiology CT Renal Stone Study  Result Date: 03/26/2021 CLINICAL DATA:  History of prior ureteral stent placement with inadvertent removal yesterday, right flank pain, initial encounter EXAM: CT ABDOMEN AND PELVIS WITHOUT CONTRAST TECHNIQUE: Multidetector CT imaging of the abdomen and pelvis was performed following the standard protocol without IV contrast. COMPARISON:  03/17/2021 FINDINGS: Lower chest: No acute abnormality. Hepatobiliary: No focal liver abnormality is seen. Status post cholecystectomy. No biliary dilatation. Pancreas: Unremarkable. No pancreatic ductal dilatation or surrounding inflammatory changes. Spleen: Normal in size without focal abnormality. Adrenals/Urinary Tract: Adrenal glands are within normal limits. Left kidney again demonstrates multiple nonobstructing renal calculi stable from the prior exam. Left ureter is within normal limits. Previously placed right ureteral stent is no longer identified. Right-sided hydronephrosis and hydroureter is noted  secondary to migration of a renal pelvic stone to the level of the right UVJ. This stone appears smaller than that seen in the renal pelvis on the prior CT likely related to recent intervention. Few small nonobstructing right renal stones are noted. The bladder is within normal limits with the exception of a small foci of air likely related to recent instrumentation. Stomach/Bowel: No obstructive or inflammatory changes of the colon are noted. The appendix is within normal limits. Small bowel and stomach are unremarkable. Vascular/Lymphatic: No significant vascular findings are present. No enlarged abdominal or  pelvic lymph nodes. Reproductive: Status post hysterectomy. No adnexal masses. Other: No abdominal wall hernia or abnormality. No abdominopelvic ascites. Musculoskeletal: No acute or significant osseous findings. IMPRESSION: Interval migration of a right sided calculus from the right renal pelvis to the right UVJ with considerable hydronephrosis and hydroureter now seen. Previously seen ureteral stent is no longer present consistent with the given clinical history. Nonobstructing bilateral renal calculi stable on the left. The fragments seen on the right are smaller than that noted on prior exam likely related to the recent intervention. No other focal abnormality is noted. Electronically Signed   By: Alcide Clever M.D.   On: 03/26/2021 20:18    Procedures Procedures   Medications Ordered in ED Medications  morphine 4 MG/ML injection 4 mg (has no administration in time range)  sodium chloride 0.9 % bolus 1,000 mL (has no administration in time range)  ondansetron (ZOFRAN) injection 4 mg (has no administration in time range)    ED Course  I have reviewed the triage vital signs and the nursing notes.  Pertinent labs & imaging results that were available during my care of the patient were reviewed by me and considered in my medical decision making (see chart for details).  Clinical Course as of 03/27/21 1631  Wynelle Link Mar 26, 2021  2056 Discussed with Dr. Ronne Binning from urology who recommended if pain is adequately controlled she can go home and if not consideration for transfer to [facility] for evaluation. [MB]  2108 Reviewed recommendations with patient from Dr. Ronne Binning.  She is was getting Toradol now and finishing of her fluid so she will give [place] an answer afterwards to see if she feels she can do this at home. [MB]  2137 Patient's pain out of 5.  She feels like she could tolerate going home.  We will provide prescription for some pain medicine nausea medicine and Flomax.  Return instructions  discussed [MB]    Clinical Course User Index [MB] Terrilee Files, MD   MDM Rules/Calculators/A&P                          This patient complains of right flank pain and decreased urination; this involves an extensive number of treatment Options and is a complaint that carries with it a high risk of complications and Morbidity. The differential includes renal colic, obstruction, pyelonephritis, dehydration, renal failure  I ordered, reviewed and interpreted labs, which included CBC with normal white count normal hemoglobin, chemistries with mildly elevated creatinine, urinalysis consistent with hematuria no evidence of infection I ordered medication IV fluids Toradol morphine Zofran with improvement in her symptoms I ordered imaging studies which included CT renal and I independently    visualized and interpreted imaging which showed which shows improvement of stone size and now at her UPJ, significant hydro  Additional history obtained from patient's husband Previous records obtained and reviewed in  epic including prior urology notes I consulted Dr. Ronne Binning [facility] and discussed lab and imaging findings  Critical Interventions: None  After the interventions stated above, I reevaluated the patient and found patient to be symptomatically improved.  She declines transfer to [facility] for urology evaluation.  She understands to return to the emergency department if any high fever or other concerning symptoms.  Return instructions discussed   Final Clinical Impression(s) / ED Diagnoses Final diagnoses:  Ureteral colic    Rx / DC Orders ED Discharge Orders          Ordered    oxyCODONE-acetaminophen (PERCOCET/ROXICET) 5-325 MG tablet  Every 6 hours PRN        03/26/21 2139    ondansetron (ZOFRAN-ODT) 4 MG disintegrating tablet        03/26/21 2139    tamsulosin (FLOMAX) 0.4 MG CAPS capsule  Daily        03/26/21 2139             Terrilee Files, MD 03/27/21  1635

## 2021-03-26 NOTE — ED Triage Notes (Signed)
Pt had ureteroscopy w/ stent placement Thur; stent came out yesterday on it's own; pt c/o RT flank pain (sts pain is worse than w/ kidney stone) and urologist said she needed a CT; also not voiding as much today

## 2021-03-26 NOTE — Discharge Instructions (Signed)
You are seen in the emergency department for continued right flank pain.  Your lab work was fairly unremarkable but your CAT scan showed continued hydronephrosis.  The kidney stone is smaller and is close to the bladder so should pass.  We are prescribing you some pain medicine nausea medication and Flomax.  Please keep in touch with urology.  Return if any high fevers or worsening symptoms

## 2021-03-27 ENCOUNTER — Other Ambulatory Visit (HOSPITAL_BASED_OUTPATIENT_CLINIC_OR_DEPARTMENT_OTHER): Payer: Self-pay

## 2021-03-27 MED ORDER — TAMSULOSIN HCL 0.4 MG PO CAPS
ORAL_CAPSULE | ORAL | 0 refills | Status: DC
  Filled 2021-03-27: qty 30, 30d supply, fill #0

## 2021-03-27 MED ORDER — ONDANSETRON 4 MG PO TBDP
ORAL_TABLET | ORAL | 0 refills | Status: DC
  Filled 2021-03-27: qty 20, 7d supply, fill #0

## 2021-03-28 ENCOUNTER — Other Ambulatory Visit (HOSPITAL_BASED_OUTPATIENT_CLINIC_OR_DEPARTMENT_OTHER): Payer: Self-pay

## 2021-03-28 DIAGNOSIS — N201 Calculus of ureter: Secondary | ICD-10-CM | POA: Diagnosis not present

## 2021-03-28 DIAGNOSIS — N3000 Acute cystitis without hematuria: Secondary | ICD-10-CM | POA: Diagnosis not present

## 2021-03-28 DIAGNOSIS — R8271 Bacteriuria: Secondary | ICD-10-CM | POA: Diagnosis not present

## 2021-03-28 MED ORDER — SULFAMETHOXAZOLE-TRIMETHOPRIM 800-160 MG PO TABS
ORAL_TABLET | ORAL | 0 refills | Status: DC
  Filled 2021-03-28: qty 14, 7d supply, fill #0

## 2021-03-28 MED ORDER — HYDROCODONE-ACETAMINOPHEN 5-325 MG PO TABS
ORAL_TABLET | ORAL | 0 refills | Status: DC
  Filled 2021-03-28: qty 20, 3d supply, fill #0

## 2021-03-28 MED ORDER — ONDANSETRON 4 MG PO TBDP
ORAL_TABLET | ORAL | 0 refills | Status: DC
  Filled 2021-03-28: qty 30, 5d supply, fill #0

## 2021-04-03 ENCOUNTER — Other Ambulatory Visit (HOSPITAL_BASED_OUTPATIENT_CLINIC_OR_DEPARTMENT_OTHER): Payer: Self-pay

## 2021-04-04 ENCOUNTER — Other Ambulatory Visit (HOSPITAL_BASED_OUTPATIENT_CLINIC_OR_DEPARTMENT_OTHER): Payer: Self-pay

## 2021-04-07 ENCOUNTER — Other Ambulatory Visit (HOSPITAL_BASED_OUTPATIENT_CLINIC_OR_DEPARTMENT_OTHER): Payer: Self-pay

## 2021-04-10 ENCOUNTER — Other Ambulatory Visit (HOSPITAL_BASED_OUTPATIENT_CLINIC_OR_DEPARTMENT_OTHER): Payer: Self-pay

## 2021-04-11 ENCOUNTER — Other Ambulatory Visit (HOSPITAL_BASED_OUTPATIENT_CLINIC_OR_DEPARTMENT_OTHER): Payer: Self-pay

## 2021-04-11 DIAGNOSIS — Z9189 Other specified personal risk factors, not elsewhere classified: Secondary | ICD-10-CM | POA: Diagnosis not present

## 2021-04-11 DIAGNOSIS — Z803 Family history of malignant neoplasm of breast: Secondary | ICD-10-CM | POA: Diagnosis not present

## 2021-04-12 ENCOUNTER — Other Ambulatory Visit (HOSPITAL_BASED_OUTPATIENT_CLINIC_OR_DEPARTMENT_OTHER): Payer: Self-pay

## 2021-04-13 ENCOUNTER — Other Ambulatory Visit: Payer: Self-pay | Admitting: Obstetrics and Gynecology

## 2021-04-13 DIAGNOSIS — Z803 Family history of malignant neoplasm of breast: Secondary | ICD-10-CM

## 2021-04-17 ENCOUNTER — Other Ambulatory Visit (HOSPITAL_BASED_OUTPATIENT_CLINIC_OR_DEPARTMENT_OTHER): Payer: Self-pay

## 2021-05-01 ENCOUNTER — Other Ambulatory Visit (HOSPITAL_BASED_OUTPATIENT_CLINIC_OR_DEPARTMENT_OTHER): Payer: Self-pay

## 2021-05-02 ENCOUNTER — Other Ambulatory Visit (HOSPITAL_BASED_OUTPATIENT_CLINIC_OR_DEPARTMENT_OTHER): Payer: Self-pay

## 2021-05-02 MED ORDER — LEVOTHYROXINE SODIUM 100 MCG PO TABS
100.0000 ug | ORAL_TABLET | Freq: Every day | ORAL | 2 refills | Status: DC
  Filled 2021-05-02: qty 30, 30d supply, fill #0
  Filled 2021-05-31: qty 30, 30d supply, fill #1
  Filled 2021-07-06: qty 30, 30d supply, fill #2

## 2021-05-02 MED ORDER — DIAZEPAM 5 MG PO TABS
5.0000 mg | ORAL_TABLET | Freq: Three times a day (TID) | ORAL | 1 refills | Status: DC
  Filled 2021-05-02: qty 70, 24d supply, fill #0
  Filled 2021-05-31: qty 70, 24d supply, fill #1

## 2021-05-02 MED ORDER — OZEMPIC (0.25 OR 0.5 MG/DOSE) 2 MG/1.5ML ~~LOC~~ SOPN
PEN_INJECTOR | SUBCUTANEOUS | 2 refills | Status: DC
  Filled 2021-05-02: qty 1.5, 30d supply, fill #0
  Filled 2021-06-07: qty 1.5, 30d supply, fill #1

## 2021-05-02 MED ORDER — ZOLPIDEM TARTRATE 10 MG PO TABS
10.0000 mg | ORAL_TABLET | Freq: Every evening | ORAL | 2 refills | Status: DC | PRN
  Filled 2021-05-02: qty 30, 30d supply, fill #0
  Filled 2021-06-07: qty 30, 30d supply, fill #1
  Filled 2021-07-06: qty 30, 30d supply, fill #2

## 2021-05-05 ENCOUNTER — Other Ambulatory Visit (HOSPITAL_BASED_OUTPATIENT_CLINIC_OR_DEPARTMENT_OTHER): Payer: Self-pay

## 2021-05-11 ENCOUNTER — Other Ambulatory Visit (HOSPITAL_BASED_OUTPATIENT_CLINIC_OR_DEPARTMENT_OTHER): Payer: Self-pay

## 2021-05-19 ENCOUNTER — Other Ambulatory Visit (HOSPITAL_BASED_OUTPATIENT_CLINIC_OR_DEPARTMENT_OTHER): Payer: Self-pay

## 2021-05-19 DIAGNOSIS — E039 Hypothyroidism, unspecified: Secondary | ICD-10-CM | POA: Diagnosis not present

## 2021-05-19 DIAGNOSIS — L659 Nonscarring hair loss, unspecified: Secondary | ICD-10-CM | POA: Diagnosis not present

## 2021-05-19 DIAGNOSIS — I471 Supraventricular tachycardia: Secondary | ICD-10-CM | POA: Diagnosis not present

## 2021-05-19 DIAGNOSIS — I10 Essential (primary) hypertension: Secondary | ICD-10-CM | POA: Diagnosis not present

## 2021-05-19 DIAGNOSIS — G43009 Migraine without aura, not intractable, without status migrainosus: Secondary | ICD-10-CM | POA: Diagnosis not present

## 2021-05-19 DIAGNOSIS — E785 Hyperlipidemia, unspecified: Secondary | ICD-10-CM | POA: Diagnosis not present

## 2021-05-19 MED ORDER — AMLODIPINE BESYLATE 5 MG PO TABS
5.0000 mg | ORAL_TABLET | Freq: Every day | ORAL | 5 refills | Status: DC
  Filled 2021-05-19: qty 90, 90d supply, fill #0

## 2021-05-22 ENCOUNTER — Other Ambulatory Visit (HOSPITAL_BASED_OUTPATIENT_CLINIC_OR_DEPARTMENT_OTHER): Payer: Self-pay

## 2021-05-22 MED ORDER — PROTRIPTYLINE HCL 10 MG PO TABS
ORAL_TABLET | ORAL | 1 refills | Status: DC
  Filled 2021-05-22 (×2): qty 180, 90d supply, fill #0

## 2021-05-23 ENCOUNTER — Other Ambulatory Visit (HOSPITAL_BASED_OUTPATIENT_CLINIC_OR_DEPARTMENT_OTHER): Payer: Self-pay

## 2021-05-31 ENCOUNTER — Other Ambulatory Visit (HOSPITAL_BASED_OUTPATIENT_CLINIC_OR_DEPARTMENT_OTHER): Payer: Self-pay

## 2021-05-31 MED ORDER — FLUOXETINE HCL 10 MG PO CAPS
ORAL_CAPSULE | ORAL | 2 refills | Status: DC
  Filled 2021-05-31: qty 30, 30d supply, fill #0

## 2021-06-06 NOTE — Progress Notes (Signed)
[facility] [place] Phone: [phone] Subjective:   Carmen [name], am serving as a scribe for Dr. Hulan Saas.  This visit occurred during the SARS-CoV-2 public health emergency.  Safety protocols were in place, including screening questions prior to the visit, additional usage of staff PPE, and extensive cleaning of exam room while observing appropriate contact time as indicated for disinfecting solutions.   I'm seeing this patient by the request  of:  Danton Sewer, MD  CC: bilateral shoulder pain   [id number]  Carmen [name] is a 40 y.o. female coming in with complaint of bilateral shoulder pain at deltoid insertion. Patient complains of waking up with numb hands in the morning for years. Patient does mammograms and feels that her injury is repetitive in nature. Feels weak and recently was unable to throw a ball. Has not tried anything for pain relief.        Past Medical History:  Diagnosis Date   Back pain    Costochondritis 09/13/2010   GERD (gastroesophageal reflux disease)    Heart palpitations    long history of    Heartburn    History of chest pain    History of fatigue    History of kidney stones    History of migraine headaches    History of tilt table evaluation    IMPRESSION: Positive tilt table study with significant cardioinhibitory  response.   Hyperlipidemia    Hypertension    Hypothyroidism    Kidney stones    history of   migraine    Neurocardiogenic syncope    Palpitations    Pericarditis 09/13/2010   Presence of permanent cardiac pacemaker    PSVT (paroxysmal supraventricular tachycardia) ([place])    Right ureteral calculus    Sleep apnea    mild osa [name] cpap needed   Sleeping difficulties    Stress    Stroke ([place]) 06/15/2019   [name] residual deficits, tpa given at time of stroke   Stroke [facility])    tia jan 2022   SVD (spontaneous vaginal delivery)    x 1   Syncope and collapse     neurocardiogenic syncopy started age 31   Wears glasses    Past Surgical History:  Procedure Laterality Date   ANTERIOR AND POSTERIOR REPAIR N/A 11/14/2017   Procedure: ANTERIOR (CYSTOCELE) AND POSTERIOR REPAIR (RECTOCELE);  Surgeon: Delsa Bern, MD;  Location: [facility];  Service: Gynecology;  Laterality: N/A;   BILATERAL SALPINGECTOMY Bilateral 11/14/2017   Procedure: BILATERAL SALPINGECTOMY;  Surgeon: Delsa Bern, MD;  Location: [facility];  Service: Gynecology;  Laterality: Bilateral;   BLADDER SUSPENSION Bilateral 11/14/2017   Procedure: TRANSVAGINAL TAPE (TVT) PROCEDURE;  Surgeon: Everett Graff, MD;  Location: [facility];  Service: Gynecology;  Laterality: Bilateral;   CHOLECYSTECTOMY  11/2007   Cholecystitis - Laparoscopic cholecystectomy with intraoperative  cholangiogram.   CYSTOSCOPY N/A 11/14/2017   Procedure: CYSTOSCOPY;  Surgeon: Everett Graff, MD;  Location: [facility];  Service: Gynecology;  Laterality: N/A;   CYSTOSCOPY WITH RETROGRADE PYELOGRAM, URETEROSCOPY AND STENT PLACEMENT Right 06/25/2019   Procedure: CYSTOSCOPY WITH RETROGRADE PYELOGRAM, AND RIGHT  STENT PLACEMENT;  Surgeon: Robley Fries, MD;  Location: [facility];  Service: Urology;  Laterality: Right;   CYSTOSCOPY WITH RETROGRADE PYELOGRAM, URETEROSCOPY AND STENT PLACEMENT Right 07/07/2019   Procedure: CYSTOSCOPY , URETEROSCOPY AND STENT REPLACEMENT;  Surgeon: Robley Fries, MD;  Location: [facility];  Service: Urology;  Laterality: Right;  CYSTOSCOPY WITH RETROGRADE PYELOGRAM, URETEROSCOPY AND STENT PLACEMENT Right 03/23/2021   Procedure: CYSTOSCOPY WITH RETROGRADE PYELOGRAM, URETEROSCOPY AND STENT PLACEMENT;  Surgeon: Marcine Matar, MD;  Location: [facility];  Service: Urology;  Laterality: Right;   HOLMIUM LASER APPLICATION Right 07/07/2019   Procedure: HOLMIUM LASER APPLICATION;  Surgeon: Noel Christmas, MD;  Location: [facility];  Service: Urology;   Laterality: Right;   HOLMIUM LASER APPLICATION Right 03/23/2021   Procedure: HOLMIUM LASER APPLICATION;  Surgeon: Marcine Matar, MD;  Location: [facility];  Service: Urology;  Laterality: Right;   PACEMAKER INSERTION  2016   Tilt Table Study  07/17/2005   Positive tilt table study with significant cardioinhibitory  response.   VAGINAL HYSTERECTOMY N/A 11/14/2017   Procedure: HYSTERECTOMY VAGINAL;  Surgeon: Silverio Lay, MD;  Location: [facility];  Service: Gynecology;  Laterality: N/A;   WISDOM TOOTH EXTRACTION     age 2   Social History   Socioeconomic History   Marital status: Married    Spouse name: Not on file   Number of children: Not on file   Years of education: Not on file   Highest education level: Not on file  Occupational History   Occupation: mammography tech  Tobacco Use   Smoking status: Never   Smokeless tobacco: Never  Vaping Use   Vaping Use: Never used  Substance and Sexual Activity   Alcohol use: Yes    Comment: very rare   Drug use: Never   Sexual activity: Not on file  Other Topics Concern   Not on file  Social History Narrative   Not on file   Social Determinants of Health   Financial Resource Strain: Not on file  Food Insecurity: Not on file  Transportation Needs: Not on file  Physical Activity: Not on file  Stress: Not on file  Social Connections: Not on file   Allergies  Allergen Reactions   Tape Hives    Adhesive tape - tegaderm/paper tape ok   Dilaudid [Hydromorphone Hcl] Itching    "I just about crawled out of my skin"   Prochlorperazine Other (See Comments)    Akathisia with compazine in ED on 05/17/19.  Resolved with repeat dose of benadryl.      Quinolones Nausea Only   Reglan [Metoclopramide] Other (See Comments)    flinging legs and "not feeling right".    Family History  Problem Relation Age of Onset   Hypertension Mother    Heart disease Mother    Cancer Mother        breast   Breast cancer Mother 50    Diabetes Mother    Hyperlipidemia Mother    Thyroid disease Mother    Depression Mother    Anxiety disorder Mother    Bipolar disorder Mother    Liver disease Mother    Alcoholism Mother    Obesity Mother    Heart attack Father    Hyperlipidemia Father    Hepatitis C Father    Diabetes Father    Hypertension Father    Heart disease Father    Sudden Cardiac Death Father    Depression Father    Anxiety disorder Father    Liver disease Father    Alcoholism Father    Drug abuse Father    Obesity Father    Cancer Maternal Aunt        Breast   Breast cancer Maternal Aunt 24   Breast cancer Maternal Grandmother 78  Current Outpatient Medications (Endocrine & Metabolic):    levothyroxine (SYNTHROID) 100 MCG tablet, TAKE 1 TABLET BY MOUTH ONCE DAILY   levothyroxine (SYNTHROID) 100 MCG tablet, Take 1 tablet by mouth once daily   Semaglutide,0.25 or 0.5MG /DOS, (OZEMPIC, 0.25 OR 0.5 MG/DOSE,) 2 MG/1.5ML SOPN, Inject 0.5 mg into the skin once a week.  Current Outpatient Medications (Cardiovascular):    amLODipine (NORVASC) 5 MG tablet, Take 1 tablet (5 mg total) by mouth daily.   atorvastatin (LIPITOR) 80 MG tablet, Take one tablet (80 mg dose) by mouth daily. (Patient taking differently: 600 pm in evening)   fenofibrate (TRICOR) 145 MG tablet, Take one tablet (145 mg dose) by mouth daily. (Patient taking differently: Take by mouth at bedtime.)   metoprolol succinate (TOPROL-XL) 100 MG 24 hr tablet, Take one tablet (100 mg dose) by mouth daily. (Patient taking differently: Take by mouth at bedtime.)  Current Outpatient Medications (Respiratory):    promethazine (PHENERGAN) 25 MG tablet, Take 25 mg by mouth every 6 (six) hours as needed for nausea or vomiting.  Current Outpatient Medications (Analgesics):    HYDROcodone-acetaminophen (NORCO/VICODIN) 5-325 MG tablet, Take 1 tablet by mouth every 4 hours as needed for kidney stone pain   meloxicam (MOBIC) 15 MG tablet, Take 1 tablet  (15 mg total) by mouth daily.   oxyCODONE-acetaminophen (PERCOCET/ROXICET) 5-325 MG tablet, Take 1 tablet by mouth every 6 (six) hours as needed for severe pain.   Rimegepant Sulfate (NURTEC) 75 MG TBDP, Dissolve one tablet by mouth as needed.   SUMAtriptan (IMITREX) 100 MG tablet, Take 1 tablet by mouth once daily as needed for migraine. May repeat ONCE in 2 hours.   traMADol (ULTRAM) 50 MG tablet, Take 1 - 2 tablets by mouth every 6 hours (Patient taking differently: every 6 (six) hours as needed.)   Current Outpatient Medications (Other):    clotrimazole-betamethasone (LOTRISONE) cream, Apply to the affected and surrounding areas of the skin 2 times daily in the morning and evening for 2 weeks (Patient taking differently: Apply topically as needed.)   diazepam (VALIUM) 5 MG tablet, Take 1 tablet (5 mg total) by mouth 3 (three) times daily.   FLUoxetine (PROZAC) 10 MG capsule, Take one capsule (10 mg dose) by mouth daily.   ondansetron (ZOFRAN-ODT) 4 MG disintegrating tablet, Dissolve one tablet by every 8 hours as needed for nausea and vomiting   ondansetron (ZOFRAN-ODT) 4 MG disintegrating tablet, Dissolve 1 tablet by mouth every 8 hours as needed for nausea and vomiting   ondansetron (ZOFRAN-ODT) 4 MG disintegrating tablet, Take 1 tablet by mouth every 4 hours   oxybutynin (DITROPAN) 5 MG tablet, Take 1 tablet (5 mg total) by mouth every 8 (eight) hours as needed for up to 15 doses for bladder spasms.   Probiotic Product (PROBIOTIC PO), Take 1 capsule by mouth daily.   protriptyline (VIVACTIL) 10 MG tablet, Take two tablets (20 mg dose) by mouth at bedtime.   solifenacin (VESICARE) 10 MG tablet, Take one tablet (10 mg dose) by mouth daily.   sulfamethoxazole-trimethoprim (BACTRIM DS) 800-160 MG tablet, Take 1 tablet by mouth 2 times daily   tamsulosin (FLOMAX) 0.4 MG CAPS capsule, Take 1 capsule (0.4 mg total) by mouth daily.   tamsulosin (FLOMAX) 0.4 MG CAPS capsule, Take 1 capsule by mouth  daily   topiramate (TOPAMAX) 50 MG tablet, Take 50 mg by mouth 2 (two) times daily. Tapering off last dose 03-22-2021   zolpidem (AMBIEN) 10 MG tablet, Take one tablet (10 mg  dose) by mouth at bedtime as needed for Sleep (Patient taking differently: at bedtime as needed.)   zolpidem (AMBIEN) 10 MG tablet, Take 1 tablet (10 mg total) by mouth at bedtime as needed sleep.   pantoprazole (PROTONIX) 40 MG tablet, Take 1 tablet (40 mg total) by mouth daily.    Review of Systems:  [name] headache, visual changes, nausea, vomiting, diarrhea, constipation, dizziness, abdominal pain, skin rash, fevers, chills, night sweats, weight loss, swollen lymph nodes,joint swelling, chest pain, shortness of breath, mood changes. POSITIVE muscle aches, body aches  Objective  Blood pressure 110/80, pulse (!) 107, height 5' 2.5" (1.588 m), weight 186 lb (84.4 kg), SpO2 98 %.   General: [name] apparent distress alert and oriented x3 mood and affect normal, dressed appropriately.  HEENT: Pupils equal, extraocular movements intact  Respiratory: Patient's speak in full sentences and does not appear short of breath  Cardiovascular: [name] lower extremity edema, non tender, [name] erythema  Gait normal with good balance and coordination.  MSK:  shoulder exam shows patient does have mild positive impingement but severe crossover noted.  Tender to palpation more over the acromioclavicular joint.  Rotator cuff strength 4+ out of 5 but symmetric neck exam good range of motion with a negative Spurling's.   Limited muscular skeletal ultrasound was performed and interpreted by Hulan Saas, M  Limited ultrasound of patient's shoulder shows the patient does on the right side have significant hypoechoic changes within the acromioclavicular joint.  Patient does have hypoechoic changes of the rotator cuff diffusely.  I do not see any true acute tear but is consistent with severe tendinitis. Impression: AC effusion with tendinitis of the rotator  cuff   Impression and Recommendations:     The above documentation has been reviewed and is accurate and complete Lyndal Pulley, DO

## 2021-06-07 ENCOUNTER — Ambulatory Visit (INDEPENDENT_AMBULATORY_CARE_PROVIDER_SITE_OTHER): Payer: 59 | Admitting: Family Medicine

## 2021-06-07 ENCOUNTER — Ambulatory Visit: Payer: Self-pay

## 2021-06-07 ENCOUNTER — Encounter: Payer: Self-pay | Admitting: Family Medicine

## 2021-06-07 ENCOUNTER — Other Ambulatory Visit (HOSPITAL_BASED_OUTPATIENT_CLINIC_OR_DEPARTMENT_OTHER): Payer: Self-pay

## 2021-06-07 ENCOUNTER — Ambulatory Visit (INDEPENDENT_AMBULATORY_CARE_PROVIDER_SITE_OTHER): Payer: 59

## 2021-06-07 ENCOUNTER — Other Ambulatory Visit: Payer: Self-pay

## 2021-06-07 VITALS — BP 110/80 | HR 107 | Ht 62.5 in | Wt 186.0 lb

## 2021-06-07 DIAGNOSIS — G8929 Other chronic pain: Secondary | ICD-10-CM

## 2021-06-07 DIAGNOSIS — M25511 Pain in right shoulder: Secondary | ICD-10-CM | POA: Diagnosis not present

## 2021-06-07 DIAGNOSIS — M25512 Pain in left shoulder: Secondary | ICD-10-CM | POA: Diagnosis not present

## 2021-06-07 DIAGNOSIS — M542 Cervicalgia: Secondary | ICD-10-CM | POA: Diagnosis not present

## 2021-06-07 MED ORDER — MELOXICAM 15 MG PO TABS
15.0000 mg | ORAL_TABLET | Freq: Every day | ORAL | 0 refills | Status: DC
  Filled 2021-06-07: qty 30, 30d supply, fill #0

## 2021-06-07 MED ORDER — PANTOPRAZOLE SODIUM 40 MG PO TBEC
40.0000 mg | DELAYED_RELEASE_TABLET | Freq: Every day | ORAL | 3 refills | Status: DC
  Filled 2021-06-07: qty 30, 30d supply, fill #0
  Filled 2021-07-06: qty 90, 90d supply, fill #1

## 2021-06-07 NOTE — Assessment & Plan Note (Addendum)
Patient is acute on chronic shoulder pain.  Seems to have hypoechoic changes in the acromioclavicular joint bilaterally.  In addition to this patient does seem to also have hypoechoic changes noted at the rotator cuff but seems to be more secondary to tendinitis.  Patient will do a short course of anti-inflammatories. Potential side effects.  Discussed icing regimen.  Discussed topical anti-inflammatories.  Follow-up again in 6 to 8 weeks worsening pain consider the possibility of injections.

## 2021-06-07 NOTE — Patient Instructions (Addendum)
Xray today Meloxicam 15mg  daily for 10 days then as needed (Stop if hurts stomach and take no other antiinflammatory drugs) Do prescribed exercises at least 3x a week Ice 20 minutes 2 times daily. Usually after activity and before bed. See you again in 5 weeks

## 2021-07-03 ENCOUNTER — Other Ambulatory Visit (HOSPITAL_BASED_OUTPATIENT_CLINIC_OR_DEPARTMENT_OTHER): Payer: Self-pay

## 2021-07-03 MED ORDER — OZEMPIC (1 MG/DOSE) 4 MG/3ML ~~LOC~~ SOPN
PEN_INJECTOR | SUBCUTANEOUS | 5 refills | Status: DC
  Filled 2021-07-03: qty 9, 84d supply, fill #0

## 2021-07-07 ENCOUNTER — Other Ambulatory Visit (HOSPITAL_BASED_OUTPATIENT_CLINIC_OR_DEPARTMENT_OTHER): Payer: Self-pay

## 2021-07-11 ENCOUNTER — Other Ambulatory Visit: Payer: Self-pay | Admitting: Family Medicine

## 2021-07-11 DIAGNOSIS — N6310 Unspecified lump in the right breast, unspecified quadrant: Secondary | ICD-10-CM

## 2021-07-11 NOTE — Progress Notes (Signed)
Carmen [name] [address] [place] Phone: [phone] Subjective:   Carmen [name], am serving as a scribe for Dr. Hulan Saas. This visit occurred during the SARS-CoV-2 public health emergency.  Safety protocols were in place, including screening questions prior to the visit, additional usage of staff PPE, and extensive cleaning of exam room while observing appropriate contact time as indicated for disinfecting solutions.   I'm seeing this patient by the request  of:  Danton Sewer, MD  CC: Bilateral shoulder pain left greater than right.  [id number]  06/07/2021 Patient is acute on chronic shoulder pain.  Seems to have hypoechoic changes in the acromioclavicular joint bilaterally.  In addition to this patient does seem to also have hypoechoic changes noted at the rotator cuff but seems to be more secondary to tendinitis.  Patient will do a short course of anti-inflammatories. Potential side effects.  Discussed icing regimen.  Discussed topical anti-inflammatories.  Follow-up again in 6 to 8 weeks worsening pain consider the possibility of injections.  Updated 07/12/2021 Carmen [name] is a 41 y.o. female coming in with complaint of bilateral shoulder pain. Right seems to be getting better. Left shoulder has gotten worse. About 2-3 weeks ago the ring and pink tenderness touch. No other complaints.  Cervical xray (-)     Past Medical History:  Diagnosis Date   Back pain    Costochondritis 09/13/2010   GERD (gastroesophageal reflux disease)    Heart palpitations    long history of    Heartburn    History of chest pain    History of fatigue    History of kidney stones    History of migraine headaches    History of tilt table evaluation    IMPRESSION: Positive tilt table study with significant cardioinhibitory  response.   Hyperlipidemia    Hypertension    Hypothyroidism    Kidney stones    history of   migraine     Neurocardiogenic syncope    Palpitations    Pericarditis 09/13/2010   Presence of permanent cardiac pacemaker    PSVT (paroxysmal supraventricular tachycardia) ([place])    Right ureteral calculus    Sleep apnea    mild osa no cpap needed   Sleeping difficulties    Stress    Stroke ([place]) 06/15/2019   no residual deficits, tpa given at time of stroke   Stroke [facility])    tia jan 2022   SVD (spontaneous vaginal delivery)    x 1   Syncope and collapse    neurocardiogenic syncopy started age 25   Wears glasses    Past Surgical History:  Procedure Laterality Date   ANTERIOR AND POSTERIOR REPAIR N/A 11/14/2017   Procedure: ANTERIOR (CYSTOCELE) AND POSTERIOR REPAIR (RECTOCELE);  Surgeon: Delsa Bern, MD;  Location: [facility];  Service: Gynecology;  Laterality: N/A;   BILATERAL SALPINGECTOMY Bilateral 11/14/2017   Procedure: BILATERAL SALPINGECTOMY;  Surgeon: Delsa Bern, MD;  Location: [facility];  Service: Gynecology;  Laterality: Bilateral;   BLADDER SUSPENSION Bilateral 11/14/2017   Procedure: TRANSVAGINAL TAPE (TVT) PROCEDURE;  Surgeon: Everett Graff, MD;  Location: [facility];  Service: Gynecology;  Laterality: Bilateral;   CHOLECYSTECTOMY  11/2007   Cholecystitis - Laparoscopic cholecystectomy with intraoperative  cholangiogram.   CYSTOSCOPY N/A 11/14/2017   Procedure: CYSTOSCOPY;  Surgeon: Everett Graff, MD;  Location: [facility];  Service: Gynecology;  Laterality: N/A;   CYSTOSCOPY WITH RETROGRADE PYELOGRAM, URETEROSCOPY AND STENT PLACEMENT Right 06/25/2019  Procedure: CYSTOSCOPY WITH RETROGRADE PYELOGRAM, AND RIGHT  STENT PLACEMENT;  Surgeon: Robley Fries, MD;  Location: [facility];  Service: Urology;  Laterality: Right;   CYSTOSCOPY WITH RETROGRADE PYELOGRAM, URETEROSCOPY AND STENT PLACEMENT Right 07/07/2019   Procedure: CYSTOSCOPY , URETEROSCOPY AND STENT REPLACEMENT;  Surgeon: Robley Fries, MD;  Location: [facility];  Service: Urology;  Laterality: Right;    CYSTOSCOPY WITH RETROGRADE PYELOGRAM, URETEROSCOPY AND STENT PLACEMENT Right 03/23/2021   Procedure: CYSTOSCOPY WITH RETROGRADE PYELOGRAM, URETEROSCOPY AND STENT PLACEMENT;  Surgeon: Franchot Gallo, MD;  Location: [facility];  Service: Urology;  Laterality: Right;   HOLMIUM LASER APPLICATION Right [id number]   Procedure: HOLMIUM LASER APPLICATION;  Surgeon: Robley Fries, MD;  [facility]  Service: Urology;  Laterality: Right;   HOLMIUM LASER APPLICATION Right [id number]   Procedure: HOLMIUM LASER APPLICATION;  Surgeon: Franchot Gallo, MD;  Location: [facility];  Service: Urology;  Laterality: Right;   PACEMAKER INSERTION  2016   Tilt Table Study  07/17/2005   Positive tilt table study with significant cardioinhibitory  response.   VAGINAL HYSTERECTOMY N/A 11/14/2017   Procedure: HYSTERECTOMY VAGINAL;  Surgeon: Delsa Bern, MD;  Location: [facility];  Service: Gynecology;  Laterality: N/A;   WISDOM TOOTH EXTRACTION     age 40   Social History   Socioeconomic History   Marital status: Married    Spouse name: Not on file   Number of children: Not on file   Years of education: Not on file   Highest education level: Not on file  Occupational History   Occupation: mammography tech  Tobacco Use   Smoking status: Never   Smokeless tobacco: Never  Vaping Use   Vaping Use: Never used  Substance and Sexual Activity   Alcohol use: Yes    Comment: very rare   Drug use: Never   Sexual activity: Not on file  Other Topics Concern   Not on file  Social History Narrative   Not on file   Social Determinants of Health   Financial Resource Strain: Not on file  Food Insecurity: Not on file  Transportation Needs: Not on file  Physical Activity: Not on file  Stress: Not on file  Social Connections: Not on file   Allergies  Allergen Reactions   Tape Hives    Adhesive tape - tegaderm/paper tape ok   Dilaudid [Hydromorphone  Hcl] Itching    "I just about crawled out of my skin"   Prochlorperazine Other (See Comments)    Akathisia with compazine in ED on 05/17/19.  Resolved with repeat dose of benadryl.      Quinolones Nausea Only   Reglan [Metoclopramide] Other (See Comments)    flinging legs and "not feeling right".    Family History  Problem Relation Age of Onset   Hypertension Mother    Heart disease Mother    Cancer Mother        breast   Breast cancer Mother 72   Diabetes Mother    Hyperlipidemia Mother    Thyroid disease Mother    Depression Mother    Anxiety disorder Mother    Bipolar disorder Mother    Liver disease Mother    Alcoholism Mother    Obesity Mother    Heart attack Father    Hyperlipidemia Father    Hepatitis C Father    Diabetes Father    Hypertension Father    Heart disease Father  Sudden Cardiac Death Father    Depression Father    Anxiety disorder Father    Liver disease Father    Alcoholism Father    Drug abuse Father    Obesity Father    Cancer Maternal Aunt        Breast   Breast cancer Maternal Aunt 46   Breast cancer Maternal Grandmother 68    Current Outpatient Medications (Endocrine & Metabolic):    levothyroxine (SYNTHROID) 100 MCG tablet, TAKE 1 TABLET BY MOUTH ONCE DAILY   levothyroxine (SYNTHROID) 100 MCG tablet, Take 1 tablet by mouth once daily   Semaglutide, 1 MG/DOSE, (OZEMPIC, 1 MG/DOSE,) 4 MG/3ML SOPN, Inject one mg into the skin once a week.   Semaglutide,0.25 or 0.5MG /DOS, (OZEMPIC, 0.25 OR 0.5 MG/DOSE,) 2 MG/1.5ML SOPN, Inject 0.5 mg into the skin once a week.   Current Outpatient Medications (Cardiovascular):    amLODipine (NORVASC) 5 MG tablet, Take 1 tablet (5 mg total) by mouth daily.   atorvastatin (LIPITOR) 80 MG tablet, Take one tablet (80 mg dose) by mouth daily. (Patient taking differently: 600 pm in evening)   fenofibrate (TRICOR) 145 MG tablet, Take one tablet (145 mg dose) by mouth daily. (Patient taking differently: Take by  mouth at bedtime.)   metoprolol succinate (TOPROL-XL) 100 MG 24 hr tablet, Take one tablet (100 mg dose) by mouth daily. (Patient taking differently: Take by mouth at bedtime.)   Current Outpatient Medications (Respiratory):    promethazine (PHENERGAN) 25 MG tablet, Take 25 mg by mouth every 6 (six) hours as needed for nausea or vomiting.   Current Outpatient Medications (Analgesics):    HYDROcodone-acetaminophen (NORCO/VICODIN) 5-325 MG tablet, Take 1 tablet by mouth every 4 hours as needed for kidney stone pain   meloxicam (MOBIC) 15 MG tablet, Take 1 tablet (15 mg total) by mouth daily.   oxyCODONE-acetaminophen (PERCOCET/ROXICET) 5-325 MG tablet, Take 1 tablet by mouth every 6 (six) hours as needed for severe pain.   Rimegepant Sulfate (NURTEC) 75 MG TBDP, Dissolve one tablet by mouth as needed.   SUMAtriptan (IMITREX) 100 MG tablet, Take 1 tablet by mouth once daily as needed for migraine. May repeat ONCE in 2 hours.   traMADol (ULTRAM) 50 MG tablet, Take 1 - 2 tablets by mouth every 6 hours (Patient taking differently: every 6 (six) hours as needed.)     Current Outpatient Medications (Other):    acyclovir (ZOVIRAX) 400 MG tablet, Take 1 tablet (400 mg total) by mouth 3 (three) times daily.   clotrimazole-betamethasone (LOTRISONE) cream, Apply to the affected and surrounding areas of the skin 2 times daily in the morning and evening for 2 weeks (Patient taking differently: Apply topically as needed.)   diazepam (VALIUM) 5 MG tablet, Take 1 tablet (5 mg total) by mouth 3 (three) times daily.   FLUoxetine (PROZAC) 10 MG capsule, Take one capsule (10 mg dose) by mouth daily.   ondansetron (ZOFRAN-ODT) 4 MG disintegrating tablet, Dissolve one tablet by every 8 hours as needed for nausea and vomiting   ondansetron (ZOFRAN-ODT) 4 MG disintegrating tablet, Dissolve 1 tablet by mouth every 8 hours as needed for nausea and vomiting   ondansetron (ZOFRAN-ODT) 4 MG disintegrating tablet, Take 1  tablet by mouth every 4 hours   oxybutynin (DITROPAN) 5 MG tablet, Take 1 tablet (5 mg total) by mouth every 8 (eight) hours as needed for up to 15 doses for bladder spasms.   pantoprazole (PROTONIX) 40 MG tablet, Take 1 tablet (40 mg total)  by mouth daily.   Probiotic Product (PROBIOTIC PO), Take 1 capsule by mouth daily.   protriptyline (VIVACTIL) 10 MG tablet, Take two tablets (20 mg dose) by mouth at bedtime.   solifenacin (VESICARE) 10 MG tablet, Take one tablet (10 mg dose) by mouth daily.   sulfamethoxazole-trimethoprim (BACTRIM DS) 800-160 MG tablet, Take 1 tablet by mouth 2 times daily   tamsulosin (FLOMAX) 0.4 MG CAPS capsule, Take 1 capsule (0.4 mg total) by mouth daily.   tamsulosin (FLOMAX) 0.4 MG CAPS capsule, Take 1 capsule by mouth daily   topiramate (TOPAMAX) 50 MG tablet, Take 50 mg by mouth 2 (two) times daily. Tapering off last dose 03-22-2021   zolpidem (AMBIEN) 10 MG tablet, Take one tablet (10 mg dose) by mouth at bedtime as needed for Sleep (Patient taking differently: at bedtime as needed.)   zolpidem (AMBIEN) 10 MG tablet, Take 1 tablet (10 mg total) by mouth at bedtime as needed sleep.  Current Facility-Administered Medications (Other):    acyclovir (ZOVIRAX) tablet 400 mg   Reviewed prior external information including notes and imaging from  primary care provider As well as notes that were available from care everywhere and other healthcare systems.  Past medical history, social, surgical and family history all reviewed in electronic medical record.  No pertanent information unless stated regarding to the chief complaint.   Review of Systems:  No headache, visual changes, nausea, vomiting, diarrhea, constipation, dizziness, abdominal pain, skin rash, fevers, chills, night sweats, weight loss, swollen lymph nodes, body aches, joint swelling, chest pain, shortness of breath, mood changes. POSITIVE muscle aches  Objective  Blood pressure 120/90, pulse 90, weight  182 lb (82.6 kg), SpO2 99 %.   General: No apparent distress alert and oriented x3 mood and affect normal, dressed appropriately.  HEENT: Pupils equal, extraocular movements intact  Respiratory: Patient's speak in full sentences and does not appear short of breath  Cardiovascular: No lower extremity edema, non tender, no erythema  MSK: Patient's left shoulder does have positive impingement noted.  Patient does have positive crossover noted.  Rotator cuff strength is intact.  Patient does have some mild erythema noted of the forearm that is severely tender to palpation even to light palpation.  This goes all the way to the dorsal aspect of the hand.  Good grip strength noted.  Procedure: Real-time Ultrasound Guided Injection of left acromioclavicular joint Device: GE Logiq Q7 Ultrasound guided injection is preferred based studies that show increased duration, increased effect, greater accuracy, decreased procedural pain, increased response rate, and decreased cost with ultrasound guided versus blind injection.  Verbal informed consent obtained.  Time-out conducted.  Noted no overlying erythema, induration, or other signs of local infection.  Skin prepped in a sterile fashion.  Local anesthesia: Topical Ethyl chloride.  With sterile technique and under real time ultrasound guidance: With a 25-gauge half inch needle injecting 0.5 cc of 0.5% Marcaine and 0.5 cc of Kenalog 40 mg/mL Completed without difficulty  Pain immediately resolved suggesting accurate placement of the medication.  Advised to call if fevers/chills, erythema, induration, drainage, or persistent bleeding.  Impression: Technically successful ultrasound guided injection.    Impression and Recommendations:     The above documentation has been reviewed and is accurate and complete Lyndal Pulley, DO

## 2021-07-12 ENCOUNTER — Other Ambulatory Visit: Payer: Self-pay

## 2021-07-12 ENCOUNTER — Ambulatory Visit (INDEPENDENT_AMBULATORY_CARE_PROVIDER_SITE_OTHER): Payer: 59 | Admitting: Family Medicine

## 2021-07-12 ENCOUNTER — Other Ambulatory Visit (HOSPITAL_BASED_OUTPATIENT_CLINIC_OR_DEPARTMENT_OTHER): Payer: Self-pay

## 2021-07-12 ENCOUNTER — Encounter: Payer: Self-pay | Admitting: Family Medicine

## 2021-07-12 ENCOUNTER — Ambulatory Visit: Payer: Self-pay

## 2021-07-12 VITALS — BP 120/90 | HR 90 | Wt 182.0 lb

## 2021-07-12 DIAGNOSIS — M25512 Pain in left shoulder: Secondary | ICD-10-CM | POA: Diagnosis not present

## 2021-07-12 DIAGNOSIS — M25519 Pain in unspecified shoulder: Secondary | ICD-10-CM | POA: Insufficient documentation

## 2021-07-12 DIAGNOSIS — M25511 Pain in right shoulder: Secondary | ICD-10-CM | POA: Diagnosis not present

## 2021-07-12 DIAGNOSIS — G8929 Other chronic pain: Secondary | ICD-10-CM | POA: Diagnosis not present

## 2021-07-12 MED ORDER — ACYCLOVIR 400 MG PO TABS
400.0000 mg | ORAL_TABLET | Freq: Three times a day (TID) | ORAL | 0 refills | Status: DC
  Filled 2021-07-12: qty 15, 5d supply, fill #0

## 2021-07-12 MED ORDER — MELOXICAM 15 MG PO TABS
15.0000 mg | ORAL_TABLET | Freq: Every day | ORAL | 0 refills | Status: DC
  Filled 2021-07-12: qty 30, 30d supply, fill #0

## 2021-07-12 MED ORDER — ACYCLOVIR 400 MG PO TABS: 400.0000 mg | ORAL_TABLET | Freq: Three times a day (TID) | ORAL | Status: AC

## 2021-07-12 NOTE — Assessment & Plan Note (Addendum)
Patient was given an injection.  He tolerated the procedure well, discussed icing regimen and home exercises.  Patient did have a significant amount of pain.  The patient does have forearm pain that seems to be out of proportion.  Concerned that patient may actually have some shingles occurring on the left upper extremity.  Patient will be started on acyclovir.  Discussed icing regimen and home exercises.  Discussed which activities to do which wants to avoid otherwise with the shoulder.  Follow-up again in 4 to 6 weeks

## 2021-07-12 NOTE — Patient Instructions (Addendum)
Injections today Prescription sent to pharmacy  See you again in 5-6 weeks

## 2021-07-13 ENCOUNTER — Other Ambulatory Visit (HOSPITAL_BASED_OUTPATIENT_CLINIC_OR_DEPARTMENT_OTHER): Payer: Self-pay

## 2021-07-14 ENCOUNTER — Other Ambulatory Visit (HOSPITAL_BASED_OUTPATIENT_CLINIC_OR_DEPARTMENT_OTHER): Payer: Self-pay

## 2021-07-14 MED ORDER — METOPROLOL SUCCINATE ER 100 MG PO TB24
ORAL_TABLET | ORAL | 0 refills | Status: DC
  Filled 2021-07-14: qty 90, 90d supply, fill #0

## 2021-07-27 ENCOUNTER — Other Ambulatory Visit (HOSPITAL_BASED_OUTPATIENT_CLINIC_OR_DEPARTMENT_OTHER): Payer: Self-pay

## 2021-07-27 DIAGNOSIS — L650 Telogen effluvium: Secondary | ICD-10-CM | POA: Diagnosis not present

## 2021-07-27 DIAGNOSIS — L648 Other androgenic alopecia: Secondary | ICD-10-CM | POA: Diagnosis not present

## 2021-07-27 DIAGNOSIS — L738 Other specified follicular disorders: Secondary | ICD-10-CM | POA: Diagnosis not present

## 2021-07-27 MED ORDER — MINOXIDIL 2.5 MG PO TABS
1.2500 mg | ORAL_TABLET | Freq: Every day | ORAL | 1 refills | Status: DC
  Filled 2021-07-27: qty 45, 90d supply, fill #0
  Filled 2021-11-30: qty 45, 90d supply, fill #1

## 2021-07-28 ENCOUNTER — Other Ambulatory Visit (HOSPITAL_BASED_OUTPATIENT_CLINIC_OR_DEPARTMENT_OTHER): Payer: Self-pay

## 2021-07-31 ENCOUNTER — Other Ambulatory Visit (HOSPITAL_BASED_OUTPATIENT_CLINIC_OR_DEPARTMENT_OTHER): Payer: Self-pay

## 2021-08-01 ENCOUNTER — Other Ambulatory Visit (HOSPITAL_BASED_OUTPATIENT_CLINIC_OR_DEPARTMENT_OTHER): Payer: Self-pay

## 2021-08-01 MED ORDER — QUETIAPINE FUMARATE 25 MG PO TABS
ORAL_TABLET | ORAL | 5 refills | Status: DC
  Filled 2021-08-01: qty 30, 30d supply, fill #0
  Filled 2021-08-27: qty 30, 30d supply, fill #1
  Filled 2021-10-08: qty 30, 30d supply, fill #2
  Filled 2021-11-14: qty 30, 30d supply, fill #3
  Filled 2021-11-30 – 2021-12-17 (×2): qty 30, 30d supply, fill #4
  Filled 2022-02-01: qty 30, 30d supply, fill #5

## 2021-08-01 MED FILL — Levothyroxine Sodium Tab 100 MCG: ORAL | 30 days supply | Qty: 30 | Fill #2 | Status: AC

## 2021-08-02 ENCOUNTER — Other Ambulatory Visit (HOSPITAL_BASED_OUTPATIENT_CLINIC_OR_DEPARTMENT_OTHER): Payer: Self-pay

## 2021-08-02 MED ORDER — PANTOPRAZOLE SODIUM 40 MG PO TBEC
40.0000 mg | DELAYED_RELEASE_TABLET | Freq: Every day | ORAL | 3 refills | Status: DC
  Filled 2021-08-02: qty 30, 30d supply, fill #0
  Filled 2021-08-27 – 2021-10-08 (×3): qty 90, 90d supply, fill #0
  Filled 2022-04-26: qty 90, 90d supply, fill #1

## 2021-08-09 ENCOUNTER — Other Ambulatory Visit: Payer: 59

## 2021-08-09 DIAGNOSIS — R928 Other abnormal and inconclusive findings on diagnostic imaging of breast: Secondary | ICD-10-CM | POA: Diagnosis not present

## 2021-08-22 NOTE — Progress Notes (Signed)
[name] [address] [place] [address] Phone: [phone] Subjective:   Carmen [name], am serving as a scribe for Dr. Antoine [name].   This visit occurred during the SARS-CoV-2 public health emergency.  Safety protocols were in place, including screening questions prior to the visit, additional usage of staff PPE, and extensive cleaning of exam room while observing appropriate contact time as indicated for disinfecting solutions.   I'm seeing this patient by the request  of:  Carmen Rolling, MD  CC: Upper back pain follow-up  AJG:[id number]  07/12/2021 Patient was given an injection.  He tolerated the procedure well, discussed icing regimen and home exercises.  Patient did have a significant amount of pain.  The patient does have forearm pain that seems to be out of proportion.  Concerned that patient may actually have some shingles occurring on the left upper extremity.  Patient will be started on acyclovir.  Discussed icing regimen and home exercises.  Discussed which activities to do which wants to avoid otherwise with the shoulder.  Follow-up again in 4 to 6 weeks  Updated 08/23/2021 Carmen [name] is a 41 y.o. female coming in with complaint of L shoulder pain patient was seen previously and did have what appeared to be more of acromioclavicular arthritic changes and given injection.  The patient states that injection did not help.   Lower back pain is increasing. Treated for shingles and feels that it has come back.        Past Medical History:  Diagnosis Date   Back pain    Costochondritis 09/13/2010   GERD (gastroesophageal reflux disease)    Heart palpitations    long history of    Heartburn    History of chest pain    History of fatigue    History of kidney stones    History of migraine headaches    History of tilt table evaluation    IMPRESSION: Positive tilt table study with significant cardioinhibitory  response.    Hyperlipidemia    Hypertension    Hypothyroidism    Kidney stones    history of   migraine    Neurocardiogenic syncope    Palpitations    Pericarditis 09/13/2010   Presence of permanent cardiac pacemaker    PSVT (paroxysmal supraventricular tachycardia) (HCC)    Right ureteral calculus    Sleep apnea    mild osa no cpap needed   Sleeping difficulties    Stress    Stroke (HCC) 06/15/2019   no residual deficits, tpa given at time of stroke   Stroke [facility])    tia jan 2022   SVD (spontaneous vaginal delivery)    x 1   Syncope and collapse    neurocardiogenic syncopy started age 81   Wears glasses    Past Surgical History:  Procedure Laterality Date   ANTERIOR AND POSTERIOR REPAIR N/A 11/14/2017   Procedure: ANTERIOR (CYSTOCELE) AND POSTERIOR REPAIR (RECTOCELE);  Surgeon: Silverio Lay, MD;  Location: [facility];  Service: Gynecology;  Laterality: N/A;   BILATERAL SALPINGECTOMY Bilateral 11/14/2017   Procedure: BILATERAL SALPINGECTOMY;  Surgeon: Silverio Lay, MD;  Location: [facility];  Service: Gynecology;  Laterality: Bilateral;   BLADDER SUSPENSION Bilateral 11/14/2017   Procedure: TRANSVAGINAL TAPE (TVT) PROCEDURE;  Surgeon: Osborn Coho, MD;  Location: [facility];  Service: Gynecology;  Laterality: Bilateral;   CHOLECYSTECTOMY  11/2007   Cholecystitis - Laparoscopic cholecystectomy with intraoperative  cholangiogram.   CYSTOSCOPY N/A 11/14/2017  Procedure: CYSTOSCOPY;  Surgeon: Everett Graff, MD;  Location: [place] [facility];  Service: Gynecology;  Laterality: N/A;   CYSTOSCOPY WITH RETROGRADE PYELOGRAM, URETEROSCOPY AND STENT PLACEMENT Right 06/25/2019   Procedure: CYSTOSCOPY WITH RETROGRADE PYELOGRAM, AND RIGHT  STENT PLACEMENT;  Surgeon: Robley Fries, MD;  Location: [facility];  Service: Urology;  Laterality: Right;   CYSTOSCOPY WITH RETROGRADE PYELOGRAM, URETEROSCOPY AND STENT PLACEMENT Right 07/07/2019   Procedure: CYSTOSCOPY , URETEROSCOPY AND STENT REPLACEMENT;  Surgeon: Robley Fries, MD;  Location: [facility];  Service: Urology;  Laterality: Right;   CYSTOSCOPY WITH RETROGRADE PYELOGRAM, URETEROSCOPY AND STENT PLACEMENT Right 03/23/2021   Procedure: CYSTOSCOPY WITH RETROGRADE PYELOGRAM, URETEROSCOPY AND STENT PLACEMENT;  Surgeon: Franchot Gallo, MD;  Location: [facility];  Service: Urology;  Laterality: Right;   HOLMIUM LASER APPLICATION Right [id number]   Procedure: HOLMIUM LASER APPLICATION;  Surgeon: Robley Fries, MD;  Location: [facility];  Service: Urology;  Laterality: Right;   HOLMIUM LASER APPLICATION Right [id number]   Procedure: HOLMIUM LASER APPLICATION;  Surgeon: Franchot Gallo, MD;  Location: [facility];  Service: Urology;  Laterality: Right;   PACEMAKER INSERTION  2016   Tilt Table Study  07/17/2005   Positive tilt table study with significant cardioinhibitory  response.   VAGINAL HYSTERECTOMY N/A 11/14/2017   Procedure: HYSTERECTOMY VAGINAL;  Surgeon: Delsa Bern, MD;  Location: [place] [facility];  Service: Gynecology;  Laterality: N/A;   WISDOM TOOTH EXTRACTION     age 11   Social History   Socioeconomic History   Marital status: Married    Spouse name: Not on file   Number of children: Not on file   Years of education: Not on file   Highest education level: Not on file  Occupational History   Occupation: mammography tech  Tobacco Use   Smoking status: Never   Smokeless tobacco: Never  Vaping Use   Vaping Use: Never used  Substance and Sexual Activity   Alcohol use: Yes    Comment: very rare   Drug use: Never   Sexual activity: Not on file  Other Topics Concern   Not on file  Social History Narrative   Not on file   Social Determinants of Health   Financial Resource Strain: Not on file  Food Insecurity: Not on file  Transportation Needs: Not on file  Physical Activity: Not on file  Stress: Not on file  Social Connections: Not on file   Allergies   Allergen Reactions   Tape Hives    Adhesive tape - tegaderm/paper tape ok   Dilaudid [Hydromorphone Hcl] Itching    "I just about crawled out of my skin"   Prochlorperazine Other (See Comments)    Akathisia with compazine in ED on 05/17/19.  Resolved with repeat dose of benadryl.      Quinolones Nausea Only   Reglan [Metoclopramide] Other (See Comments)    flinging legs and "not feeling right".    Family History  Problem Relation Age of Onset   Hypertension Mother    Heart disease Mother    Cancer Mother        breast   Breast cancer Mother 36   Diabetes Mother    Hyperlipidemia Mother    Thyroid disease Mother    Depression Mother    Anxiety disorder Mother    Bipolar disorder Mother    Liver disease Mother    Alcoholism Mother    Obesity Mother    Heart attack  Father    Hyperlipidemia Father    Hepatitis C Father    Diabetes Father    Hypertension Father    Heart disease Father    Sudden Cardiac Death Father    Depression Father    Anxiety disorder Father    Liver disease Father    Alcoholism Father    Drug abuse Father    Obesity Father    Cancer Maternal Aunt        Breast   Breast cancer Maternal Aunt 50   Breast cancer Maternal Grandmother 40    Current Outpatient Medications (Endocrine & Metabolic):    levothyroxine (SYNTHROID) 100 MCG tablet, TAKE 1 TABLET BY MOUTH ONCE DAILY   levothyroxine (SYNTHROID) 100 MCG tablet, Take 1 tablet by mouth once daily   Semaglutide, 1 MG/DOSE, (OZEMPIC, 1 MG/DOSE,) 4 MG/3ML SOPN, Inject one mg into the skin once a week.   Semaglutide,0.25 or 0.5MG /DOS, (OZEMPIC, 0.25 OR 0.5 MG/DOSE,) 2 MG/1.5ML SOPN, Inject 0.5 mg into the skin once a week.  Current Outpatient Medications (Cardiovascular):    amLODipine (NORVASC) 5 MG tablet, Take 1 tablet (5 mg total) by mouth daily.   atorvastatin (LIPITOR) 80 MG tablet, Take one tablet (80 mg dose) by mouth daily. (Patient taking differently: 600 pm in evening)   fenofibrate  (TRICOR) 145 MG tablet, Take one tablet (145 mg dose) by mouth daily. (Patient taking differently: Take by mouth at bedtime.)   metoprolol succinate (TOPROL-XL) 100 MG 24 hr tablet, Take one tablet (100 mg dose) by mouth daily.   minoxidil (LONITEN) 2.5 MG tablet, Take 1/2 tablet by mouth daily.  Current Outpatient Medications (Respiratory):    promethazine (PHENERGAN) 25 MG tablet, Take 25 mg by mouth every 6 (six) hours as needed for nausea or vomiting.  Current Outpatient Medications (Analgesics):    HYDROcodone-acetaminophen (NORCO/VICODIN) 5-325 MG tablet, Take 1 tablet by mouth every 4 hours as needed for kidney stone pain   meloxicam (MOBIC) 15 MG tablet, Take 1 tablet (15 mg total) by mouth daily.   oxyCODONE-acetaminophen (PERCOCET/ROXICET) 5-325 MG tablet, Take 1 tablet by mouth every 6 (six) hours as needed for severe pain.   Rimegepant Sulfate (NURTEC) 75 MG TBDP, Dissolve one tablet by mouth as needed.   SUMAtriptan (IMITREX) 100 MG tablet, Take 1 tablet by mouth once daily as needed for migraine. May repeat ONCE in 2 hours.   traMADol (ULTRAM) 50 MG tablet, Take 1 - 2 tablets by mouth every 6 hours (Patient taking differently: every 6 (six) hours as needed.)   Current Outpatient Medications (Other):    acyclovir (ZOVIRAX) 400 MG tablet, Take 1 tablet (400 mg total) by mouth 3 (three) times daily.   clotrimazole-betamethasone (LOTRISONE) cream, Apply to the affected and surrounding areas of the skin 2 times daily in the morning and evening for 2 weeks (Patient taking differently: Apply topically as needed.)   diazepam (VALIUM) 5 MG tablet, Take 1 tablet (5 mg total) by mouth 3 (three) times daily.   FLUoxetine (PROZAC) 10 MG capsule, Take one capsule (10 mg dose) by mouth daily.   ondansetron (ZOFRAN-ODT) 4 MG disintegrating tablet, Dissolve one tablet by every 8 hours as needed for nausea and vomiting   ondansetron (ZOFRAN-ODT) 4 MG disintegrating tablet, Dissolve 1 tablet by  mouth every 8 hours as needed for nausea and vomiting   ondansetron (ZOFRAN-ODT) 4 MG disintegrating tablet, Take 1 tablet by mouth every 4 hours   oxybutynin (DITROPAN) 5 MG tablet, Take 1 tablet (5  mg total) by mouth every 8 (eight) hours as needed for up to 15 doses for bladder spasms.   pantoprazole (PROTONIX) 40 MG tablet, Take 1 tablet (40 mg total) by mouth daily.   Probiotic Product (PROBIOTIC PO), Take 1 capsule by mouth daily.   protriptyline (VIVACTIL) 10 MG tablet, Take two tablets (20 mg dose) by mouth at bedtime.   QUEtiapine (SEROQUEL) 25 MG tablet, Take one tablet (25 mg dose) by mouth at bedtime for 30 days.   solifenacin (VESICARE) 10 MG tablet, Take one tablet (10 mg dose) by mouth daily.   sulfamethoxazole-trimethoprim (BACTRIM DS) 800-160 MG tablet, Take 1 tablet by mouth 2 times daily   tamsulosin (FLOMAX) 0.4 MG CAPS capsule, Take 1 capsule (0.4 mg total) by mouth daily.   tamsulosin (FLOMAX) 0.4 MG CAPS capsule, Take 1 capsule by mouth daily   topiramate (TOPAMAX) 50 MG tablet, Take 50 mg by mouth 2 (two) times daily. Tapering off last dose 03-22-2021   zolpidem (AMBIEN) 10 MG tablet, Take one tablet (10 mg dose) by mouth at bedtime as needed for Sleep (Patient taking differently: at bedtime as needed.)   zolpidem (AMBIEN) 10 MG tablet, Take 1 tablet (10 mg total) by mouth at bedtime as needed sleep.   Reviewed prior external information including notes and imaging from  primary care provider As well as notes that were available from care everywhere and other healthcare systems.  Past medical history, social, surgical and family history all reviewed in electronic medical record.  No pertanent information unless stated regarding to the chief complaint.   Review of Systems:  No headache, visual changes, nausea, vomiting, diarrhea, constipation, dizziness, abdominal pain, skin rash, fevers, chills, night sweats, weight loss, swollen lymph nodes, body aches, joint swelling,  chest pain, shortness of breath, mood changes. POSITIVE muscle aches  Objective  Blood pressure 112/80, pulse 96, height 5' 2.5" (1.588 m), weight 176 lb (79.8 kg), SpO2 99 %.   General: No apparent distress alert and oriented x3 mood and affect normal, dressed appropriately.  HEENT: Pupils equal, extraocular movements intact  Respiratory: Patient's speak in full sentences and does not appear short of breath  Cardiovascular: No lower extremity edema, non tender, no erythema  Gait normal with good balance and coordination.  MSK: Low back exam does have some loss of lordosis.  Some tenderness to palpation in the paraspinal musculature right greater than left.  Patient does have tightness of the hip flexors right greater than left as well with weakness with 3+ out of 5 strength of the hip abductors.  Osteopathic findings T8 extended rotated and side bent left L1 flexed rotated and side bent right L3 flexed rotated side bent left Sacrum left on left     Impression and Recommendations:     The above documentation has been reviewed and is accurate and complete Lyndal Pulley, DO

## 2021-08-23 ENCOUNTER — Other Ambulatory Visit: Payer: Self-pay

## 2021-08-23 ENCOUNTER — Ambulatory Visit (INDEPENDENT_AMBULATORY_CARE_PROVIDER_SITE_OTHER): Payer: 59 | Admitting: Family Medicine

## 2021-08-23 ENCOUNTER — Ambulatory Visit: Payer: Self-pay

## 2021-08-23 ENCOUNTER — Ambulatory Visit (INDEPENDENT_AMBULATORY_CARE_PROVIDER_SITE_OTHER): Payer: 59

## 2021-08-23 VITALS — BP 112/80 | HR 96 | Ht 62.5 in | Wt 176.0 lb

## 2021-08-23 DIAGNOSIS — N819 Female genital prolapse, unspecified: Secondary | ICD-10-CM

## 2021-08-23 DIAGNOSIS — M9904 Segmental and somatic dysfunction of sacral region: Secondary | ICD-10-CM

## 2021-08-23 DIAGNOSIS — G8929 Other chronic pain: Secondary | ICD-10-CM

## 2021-08-23 DIAGNOSIS — M533 Sacrococcygeal disorders, not elsewhere classified: Secondary | ICD-10-CM | POA: Diagnosis not present

## 2021-08-23 DIAGNOSIS — M9902 Segmental and somatic dysfunction of thoracic region: Secondary | ICD-10-CM | POA: Diagnosis not present

## 2021-08-23 DIAGNOSIS — M25512 Pain in left shoulder: Secondary | ICD-10-CM

## 2021-08-23 DIAGNOSIS — M9903 Segmental and somatic dysfunction of lumbar region: Secondary | ICD-10-CM

## 2021-08-23 DIAGNOSIS — M25511 Pain in right shoulder: Secondary | ICD-10-CM | POA: Diagnosis not present

## 2021-08-23 NOTE — Patient Instructions (Addendum)
Xray today ?PT pelvic floor and SI Joint [facility]  ?Tried manipulation  ?We can consider it every 6-8 weeks ? ? ?

## 2021-08-24 ENCOUNTER — Encounter: Payer: Self-pay | Admitting: Family Medicine

## 2021-08-24 DIAGNOSIS — M9904 Segmental and somatic dysfunction of sacral region: Secondary | ICD-10-CM | POA: Insufficient documentation

## 2021-08-24 NOTE — Assessment & Plan Note (Signed)
Continues to have shoulder pain that seems to be more secondary to employment and repetitive activity.  Not as concerning for a rotator cuff injury.  Attempted a acromioclavicular injection last time but did not make a significant improvement.  Patient does state maybe a little bit.  We discussed continuing the home exercises.  We discussed possible formal physical therapy which I think will be beneficial and referred today.  Further evaluation with x-ray of the shoulder to see if any other bony abnormality could be contributing.  Follow-up with me again in 6 to 8 weeks ?

## 2021-08-24 NOTE — Assessment & Plan Note (Signed)

## 2021-08-24 NOTE — Assessment & Plan Note (Signed)
Patient does have back pain and has had a pelvic prolapse.  Do feel that pelvic floor physical therapy could be very helpful.  Patient will be referred for this as well. ?

## 2021-08-25 MED ORDER — ACYCLOVIR 400 MG PO TABS
400.0000 mg | ORAL_TABLET | Freq: Every day | ORAL | 1 refills | Status: DC
  Filled 2021-08-28: qty 90, 90d supply, fill #0
  Filled 2022-05-04 – 2022-05-30 (×2): qty 90, 90d supply, fill #1

## 2021-08-27 ENCOUNTER — Other Ambulatory Visit (HOSPITAL_BASED_OUTPATIENT_CLINIC_OR_DEPARTMENT_OTHER): Payer: Self-pay

## 2021-08-27 ENCOUNTER — Other Ambulatory Visit: Payer: Self-pay | Admitting: Family Medicine

## 2021-08-28 ENCOUNTER — Other Ambulatory Visit (HOSPITAL_BASED_OUTPATIENT_CLINIC_OR_DEPARTMENT_OTHER): Payer: Self-pay

## 2021-08-28 MED ORDER — DIAZEPAM 5 MG PO TABS
ORAL_TABLET | ORAL | 1 refills | Status: DC
  Filled 2021-08-28: qty 70, 23d supply, fill #0
  Filled 2021-10-22: qty 70, 23d supply, fill #1

## 2021-08-28 MED ORDER — MELOXICAM 15 MG PO TABS
15.0000 mg | ORAL_TABLET | Freq: Every day | ORAL | 0 refills | Status: DC
  Filled 2021-08-28: qty 30, 30d supply, fill #0

## 2021-08-28 MED ORDER — SUMATRIPTAN SUCCINATE 100 MG PO TABS
ORAL_TABLET | ORAL | 2 refills | Status: DC
  Filled 2021-08-28: qty 9, 30d supply, fill #0
  Filled 2022-03-05: qty 9, 30d supply, fill #1
  Filled 2022-04-26: qty 9, 30d supply, fill #2

## 2021-08-28 MED ORDER — LEVOTHYROXINE SODIUM 100 MCG PO TABS
100.0000 ug | ORAL_TABLET | Freq: Every day | ORAL | 2 refills | Status: DC
  Filled 2021-08-28: qty 30, 30d supply, fill #0
  Filled 2021-10-08: qty 30, 30d supply, fill #1
  Filled 2021-11-30: qty 30, 30d supply, fill #2

## 2021-08-29 ENCOUNTER — Other Ambulatory Visit (HOSPITAL_BASED_OUTPATIENT_CLINIC_OR_DEPARTMENT_OTHER): Payer: Self-pay

## 2021-08-29 MED ORDER — OZEMPIC (2 MG/DOSE) 8 MG/3ML ~~LOC~~ SOPN
PEN_INJECTOR | SUBCUTANEOUS | 5 refills | Status: DC
  Filled 2021-08-29: qty 3, 28d supply, fill #0
  Filled 2021-10-08: qty 3, 28d supply, fill #1

## 2021-09-01 ENCOUNTER — Other Ambulatory Visit (HOSPITAL_BASED_OUTPATIENT_CLINIC_OR_DEPARTMENT_OTHER): Payer: Self-pay

## 2021-09-24 ENCOUNTER — Other Ambulatory Visit: Payer: Self-pay

## 2021-09-24 ENCOUNTER — Emergency Department (HOSPITAL_BASED_OUTPATIENT_CLINIC_OR_DEPARTMENT_OTHER): Payer: 59

## 2021-09-24 ENCOUNTER — Encounter (HOSPITAL_BASED_OUTPATIENT_CLINIC_OR_DEPARTMENT_OTHER): Payer: Self-pay | Admitting: Emergency Medicine

## 2021-09-24 ENCOUNTER — Emergency Department (HOSPITAL_BASED_OUTPATIENT_CLINIC_OR_DEPARTMENT_OTHER)
Admission: EM | Admit: 2021-09-24 | Discharge: 2021-09-24 | Disposition: A | Discharge status: Home / Self Care | Payer: 59 | Attending: Emergency Medicine | Admitting: Emergency Medicine

## 2021-09-24 DIAGNOSIS — E039 Hypothyroidism, unspecified: Secondary | ICD-10-CM | POA: Diagnosis not present

## 2021-09-24 DIAGNOSIS — Z79899 Other long term (current) drug therapy: Secondary | ICD-10-CM | POA: Insufficient documentation

## 2021-09-24 DIAGNOSIS — N201 Calculus of ureter: Secondary | ICD-10-CM | POA: Diagnosis not present

## 2021-09-24 DIAGNOSIS — N200 Calculus of kidney: Secondary | ICD-10-CM | POA: Diagnosis not present

## 2021-09-24 DIAGNOSIS — R188 Other ascites: Secondary | ICD-10-CM | POA: Diagnosis not present

## 2021-09-24 DIAGNOSIS — R102 Pelvic and perineal pain: Secondary | ICD-10-CM | POA: Diagnosis not present

## 2021-09-24 DIAGNOSIS — E119 Type 2 diabetes mellitus without complications: Secondary | ICD-10-CM | POA: Diagnosis not present

## 2021-09-24 DIAGNOSIS — I10 Essential (primary) hypertension: Secondary | ICD-10-CM | POA: Diagnosis not present

## 2021-09-24 DIAGNOSIS — Z794 Long term (current) use of insulin: Secondary | ICD-10-CM | POA: Insufficient documentation

## 2021-09-24 DIAGNOSIS — Z9049 Acquired absence of other specified parts of digestive tract: Secondary | ICD-10-CM | POA: Diagnosis not present

## 2021-09-24 DIAGNOSIS — R109 Unspecified abdominal pain: Secondary | ICD-10-CM | POA: Diagnosis present

## 2021-09-24 LAB — CBC WITH DIFFERENTIAL/PLATELET
Abs Immature Granulocytes: 0.02 10*3/uL (ref 0.00–0.07)
Basophils Absolute: 0 10*3/uL (ref 0.0–0.1)
Basophils Relative: 0 %
Eosinophils Absolute: 0.1 10*3/uL (ref 0.0–0.5)
Eosinophils Relative: 2 %
HCT: 41.6 % (ref 36.0–46.0)
Hemoglobin: 14.6 g/dL (ref 12.0–15.0)
Immature Granulocytes: 0 %
Lymphocytes Relative: 21 %
Lymphs Abs: 1.6 10*3/uL (ref 0.7–4.0)
MCH: 31.1 pg (ref 26.0–34.0)
MCHC: 35.1 g/dL (ref 30.0–36.0)
MCV: 88.5 fL (ref 80.0–100.0)
Monocytes Absolute: 0.5 10*3/uL (ref 0.1–1.0)
Monocytes Relative: 7 %
Neutro Abs: 5 10*3/uL (ref 1.7–7.7)
Neutrophils Relative %: 70 %
Platelets: 241 10*3/uL (ref 150–400)
RBC: 4.7 MIL/uL (ref 3.87–5.11)
RDW: 12.5 % (ref 11.5–15.5)
WBC: 7.3 10*3/uL (ref 4.0–10.5)
nRBC: 0 % (ref 0.0–0.2)

## 2021-09-24 LAB — COMPREHENSIVE METABOLIC PANEL
ALT: 24 U/L (ref 0–44)
AST: 18 U/L (ref 15–41)
Albumin: 4.2 g/dL (ref 3.5–5.0)
Alkaline Phosphatase: 53 U/L (ref 38–126)
Anion gap: 6 (ref 5–15)
BUN: 9 mg/dL (ref 6–20)
CO2: 25 mmol/L (ref 22–32)
Calcium: 8.9 mg/dL (ref 8.9–10.3)
Chloride: 107 mmol/L (ref 98–111)
Creatinine, Ser: 0.68 mg/dL (ref 0.44–1.00)
GFR, Estimated: >60 mL/min (ref >60)
Glucose, Bld: 75 mg/dL (ref 70–99)
Potassium: 3.6 mmol/L (ref 3.5–5.1)
Sodium: 138 mmol/L (ref 135–145)
Total Bilirubin: 0.5 mg/dL (ref 0.3–1.2)
Total Protein: 7.4 g/dL (ref 6.5–8.1)

## 2021-09-24 LAB — URINALYSIS, ROUTINE W REFLEX MICROSCOPIC
Bilirubin Urine: NEGATIVE
Glucose, UA: NEGATIVE mg/dL
Hgb urine dipstick: NEGATIVE
Ketones, ur: NEGATIVE mg/dL
Leukocytes,Ua: NEGATIVE
Nitrite: NEGATIVE
Protein, ur: NEGATIVE mg/dL
Specific Gravity, Urine: 1.025 (ref 1.005–1.030)
pH: 7 (ref 5.0–8.0)

## 2021-09-24 MED ORDER — MORPHINE SULFATE (PF) 4 MG/ML IV SOLN
4.0000 mg | Freq: Once | INTRAVENOUS | Status: AC
  Administered 2021-09-24: 4 mg via INTRAVENOUS
  Filled 2021-09-24: qty 1

## 2021-09-24 MED ORDER — SODIUM CHLORIDE 0.9 % IV BOLUS
1000.0000 mL | Freq: Once | INTRAVENOUS | Status: AC
Start: 2021-09-24 — End: 2021-09-24
  Administered 2021-09-24: 1000 mL via INTRAVENOUS

## 2021-09-24 MED ORDER — TAMSULOSIN HCL 0.4 MG PO CAPS
0.4000 mg | ORAL_CAPSULE | Freq: Every day | ORAL | 0 refills | Status: DC
Start: 2021-09-24 — End: 2022-05-31
  Filled 2021-09-24: qty 30, 30d supply, fill #0

## 2021-09-24 MED ORDER — OXYCODONE-ACETAMINOPHEN 5-325 MG PO TABS
1.0000 | ORAL_TABLET | Freq: Four times a day (QID) | ORAL | 0 refills | Status: DC | PRN
  Filled 2021-09-24: qty 15, 4d supply, fill #0

## 2021-09-24 MED ORDER — ONDANSETRON HCL 4 MG/2ML IJ SOLN
4.0000 mg | Freq: Once | INTRAMUSCULAR | Status: AC
Start: 2021-09-24 — End: 2021-09-24
  Administered 2021-09-24: 4 mg via INTRAVENOUS
  Filled 2021-09-24: qty 2

## 2021-09-24 MED ORDER — ONDANSETRON 4 MG PO TBDP
4.0000 mg | ORAL_TABLET | Freq: Three times a day (TID) | ORAL | 0 refills | Status: DC | PRN
  Filled 2021-09-24: qty 20, 7d supply, fill #0

## 2021-09-24 NOTE — ED Triage Notes (Signed)
Pt reports R flank and lower back pains. Also had a fever yesterday. Hx of kidney stones; symptoms are similar but pain in groin is different for her. ?

## 2021-09-24 NOTE — ED Notes (Signed)
Pt aware urine specimen ordered. Pt reports inability to provide specimen at this time. Specimen collection device provided to patient. 

## 2021-09-24 NOTE — ED Provider Notes (Signed)
?[facility] EMERGENCY DEPARTMENT ?Provider Note ? ? ?CSN: [id number] ?Arrival date & time: 09/24/21  1346 ? ?  ? ?History ? ?Chief Complaint  ?Patient presents with  ? Flank Pain  ? ? ?Carmen [name] is a 41 y.o. female. ? ?41 year old female presents today for evaluation of left flank pain associated with nausea, chills, decreased urine, lack of appetite.  She denies associated abdominal pain, vomiting, dysuria, or other complaints.  She does have history of kidney stones.  She states occasionally her flank pain is bilateral but predominantly on the left side.  She states it is radiating down to her groin and is unable to find a comfortable position with her legs.  She states that component is not usual with her kidney stones.  She has been unable to sleep because of the pain. ? ?The history is provided by the patient. No language interpreter was used.  ? ?  ? ?Home Medications ?Prior to Admission medications   ?Medication Sig Start Date End Date Taking? Authorizing Provider  ?acyclovir (ZOVIRAX) 400 MG tablet Take 1 tablet (400 mg total) by mouth daily. 08/25/21   Judi SaaSmith, Zachary M, DO  ?amLODipine (NORVASC) 5 MG tablet Take 1 tablet (5 mg total) by mouth daily. 05/19/21     ?atorvastatin (LIPITOR) 80 MG tablet Take one tablet (80 mg dose) by mouth daily. ?Patient taking differently: 600 pm in evening 11/22/20     ?clotrimazole-betamethasone (LOTRISONE) cream Apply to the affected and surrounding areas of the skin 2 times daily in the morning and evening for 2 weeks ?Patient taking differently: Apply topically as needed. 02/20/21     ?diazepam (VALIUM) 5 MG tablet Take 1 tablet (5 mg total) by mouth 3 (three) times daily. 08/28/21     ?fenofibrate (TRICOR) 145 MG tablet Take one tablet (145 mg dose) by mouth daily. ?Patient taking differently: Take by mouth at bedtime. 11/22/20     ?FLUoxetine (PROZAC) 10 MG capsule Take one capsule (10 mg dose) by mouth daily. 05/31/21     ?HYDROcodone-acetaminophen  (NORCO/VICODIN) 5-325 MG tablet Take 1 tablet by mouth every 4 hours as needed for kidney stone pain 03/28/21     ?levothyroxine (SYNTHROID) 100 MCG tablet TAKE 1 TABLET BY MOUTH ONCE DAILY 08/04/20 09/01/21  Marthenia RollingHowell, Travis, MD  ?levothyroxine (SYNTHROID) 100 MCG tablet Take 1 tablet by mouth once daily 08/28/21     ?meloxicam (MOBIC) 15 MG tablet Take 1 tablet (15 mg total) by mouth daily. 08/28/21   Judi SaaSmith, Zachary M, DO  ?metoprolol succinate (TOPROL-XL) 100 MG 24 hr tablet Take one tablet (100 mg dose) by mouth daily. 07/14/21     ?minoxidil (LONITEN) 2.5 MG tablet Take 1/2 tablet by mouth daily. 07/27/21     ?ondansetron (ZOFRAN-ODT) 4 MG disintegrating tablet Dissolve one tablet by every 8 hours as needed for nausea and vomiting 03/26/21   Terrilee FilesButler, Michael C, MD  ?ondansetron (ZOFRAN-ODT) 4 MG disintegrating tablet Dissolve 1 tablet by mouth every 8 hours as needed for nausea and vomiting 03/26/21   Lennette BihariButler, John Michael, MD  ?ondansetron (ZOFRAN-ODT) 4 MG disintegrating tablet Take 1 tablet by mouth every 4 hours 03/28/21     ?oxybutynin (DITROPAN) 5 MG tablet Take 1 tablet (5 mg total) by mouth every 8 (eight) hours as needed for up to 15 doses for bladder spasms. 03/23/21   Marcine Matarahlstedt, Stephen, MD  ?oxyCODONE-acetaminophen (PERCOCET/ROXICET) 5-325 MG tablet Take 1 tablet by mouth every 6 (six) hours as needed for severe pain. 03/26/21  Terrilee Files, MD  ?pantoprazole (PROTONIX) 40 MG tablet Take 1 tablet (40 mg total) by mouth daily. 08/02/21     ?Probiotic Product (PROBIOTIC PO) Take 1 capsule by mouth daily.    [provider]  ?promethazine (PHENERGAN) 25 MG tablet Take 25 mg by mouth every 6 (six) hours as needed for nausea or vomiting.    [provider]  ?protriptyline (VIVACTIL) 10 MG tablet Take two tablets (20 mg dose) by mouth at bedtime. 05/22/21     ?QUEtiapine (SEROQUEL) 25 MG tablet Take one tablet (25 mg dose) by mouth at bedtime for 30 days. 08/01/21     ?Rimegepant Sulfate (NURTEC) 75 MG  TBDP Dissolve one tablet by mouth as needed. 04/15/20     ?Semaglutide, 1 MG/DOSE, (OZEMPIC, 1 MG/DOSE,) 4 MG/3ML SOPN Inject one mg into the skin once a week. 07/03/21     ?Semaglutide, 2 MG/DOSE, (OZEMPIC, 2 MG/DOSE,) 8 MG/3ML SOPN Inject 2 mg into the skin once a week. 08/28/21     ?Semaglutide,0.25 or 0.5MG /DOS, (OZEMPIC, 0.25 OR 0.5 MG/DOSE,) 2 MG/1.5ML SOPN Inject 0.5 mg into the skin once a week. 05/02/21     ?solifenacin (VESICARE) 10 MG tablet Take one tablet (10 mg dose) by mouth daily. 11/23/20     ?sulfamethoxazole-trimethoprim (BACTRIM DS) 800-160 MG tablet Take 1 tablet by mouth 2 times daily 03/28/21     ?SUMAtriptan (IMITREX) 100 MG tablet Take 1 tablet by mouth once daily as needed for migraine. May repeat ONCE in 2 hours. 08/28/21     ?tamsulosin (FLOMAX) 0.4 MG CAPS capsule Take 1 capsule (0.4 mg total) by mouth daily. 03/26/21   Terrilee Files, MD  ?tamsulosin [facility]) 0.4 MG CAPS capsule Take 1 capsule by mouth daily 03/26/21   Terrilee Files, MD  ?topiramate (TOPAMAX) 50 MG tablet Take 50 mg by mouth 2 (two) times daily. Tapering off last dose 03-22-2021 01/18/20   [provider]  ?traMADol (ULTRAM) 50 MG tablet Take 1 - 2 tablets by mouth every 6 hours ?Patient taking differently: every 6 (six) hours as needed. 03/17/21     ?zolpidem (AMBIEN) 10 MG tablet Take one tablet (10 mg dose) by mouth at bedtime as needed for Sleep ?Patient taking differently: at bedtime as needed. 03/13/21     ?zolpidem (AMBIEN) 10 MG tablet Take 1 tablet (10 mg total) by mouth at bedtime as needed sleep. 05/01/21     ?   ? ?Allergies    ?Tape, Dilaudid [hydromorphone hcl], Prochlorperazine, Quinolones, and Reglan [metoclopramide]   ? ?Review of Systems   ?Review of Systems  ?Constitutional:  Negative for chills and fever.  ?Gastrointestinal:  Positive for nausea. Negative for abdominal pain and vomiting.  ?Genitourinary:  Positive for decreased urine volume and flank pain. Negative for difficulty urinating and  dysuria.  ?Neurological:  Negative for weakness and light-headedness.  ?All other systems reviewed and are negative. ? ?Physical Exam ?Updated Vital Signs ?BP (!) 156/117 (BP Location: Left Arm)   Pulse 98   Temp 98.1 ?F (36.7 ?C) (Oral)   Resp 18   LMP  (LMP Unknown)   SpO2 99%  ?Physical Exam ?Vitals and nursing note reviewed.  ?Constitutional:   ?   General: She is not in acute distress. ?   Appearance: Normal appearance. She is not ill-appearing.  ?HENT:  ?   Head: Normocephalic and atraumatic.  ?   Nose: Nose normal.  ?Eyes:  ?   General: No scleral icterus. ?  Extraocular Movements: Extraocular movements intact.  ?   Conjunctiva/sclera: Conjunctivae normal.  ?Cardiovascular:  ?   Rate and Rhythm: Normal rate and regular rhythm.  ?   Pulses: Normal pulses.  ?   Heart sounds: Normal heart sounds.  ?Pulmonary:  ?   Effort: Pulmonary effort is normal. No respiratory distress.  ?   Breath sounds: Normal breath sounds. No wheezing or rales.  ?Abdominal:  ?   General: There is no distension.  ?   Palpations: Abdomen is soft.  ?   Tenderness: There is abdominal tenderness (Left lower quadrant). There is right CVA tenderness, left CVA tenderness and guarding (Left lower quadrant).  ?Musculoskeletal:     ?   General: Normal range of motion.  ?   Cervical back: Normal range of motion.  ?   Right lower leg: No edema.  ?   Left lower leg: No edema.  ?Skin: ?   General: Skin is warm and dry.  ?Neurological:  ?   General: No focal deficit present.  ?   Mental Status: She is alert. Mental status is at baseline.  ? ? ?ED Results / Procedures / Treatments   ?Labs ?(all labs ordered are listed, but only abnormal results are displayed) ?Labs Reviewed  ?URINALYSIS, ROUTINE W REFLEX MICROSCOPIC  ? ? ?EKG ?None ? ?Radiology ?No results found. ? ?Procedures ?Procedures  ? ? ?Medications Ordered in ED ?Medications  ?sodium chloride 0.9 % bolus 1,000 mL (has no administration in time range)  ?morphine (PF) 4 MG/ML injection 4 mg  (has no administration in time range)  ?ondansetron (ZOFRAN) injection 4 mg (has no administration in time range)  ? ? ?ED Course/ Medical Decision Making/ A&P ?  ?                        ?Medical Decision Making ?Amount and/o

## 2021-09-24 NOTE — Discharge Instructions (Addendum)
Your work-up today showed that you have 3 kidney stones on the left largest one measuring 6 mm.  Your work-up otherwise with blood work and urine did not show any signs of infection.  You already have an appointment scheduled with your urologist tomorrow.  Please keep this appointment.  I have sent nausea medicine pain medication and Flomax into the pharmacy for you.  If you have any worsening symptoms please return to the emergency room. ?

## 2021-09-25 ENCOUNTER — Other Ambulatory Visit (HOSPITAL_BASED_OUTPATIENT_CLINIC_OR_DEPARTMENT_OTHER): Payer: Self-pay

## 2021-09-25 DIAGNOSIS — N200 Calculus of kidney: Secondary | ICD-10-CM | POA: Diagnosis not present

## 2021-09-25 MED ORDER — CEPHALEXIN 500 MG PO CAPS
ORAL_CAPSULE | ORAL | 0 refills | Status: DC
  Filled 2021-09-25: qty 10, 5d supply, fill #0

## 2021-10-03 ENCOUNTER — Other Ambulatory Visit (HOSPITAL_BASED_OUTPATIENT_CLINIC_OR_DEPARTMENT_OTHER): Payer: Self-pay

## 2021-10-04 ENCOUNTER — Other Ambulatory Visit (HOSPITAL_BASED_OUTPATIENT_CLINIC_OR_DEPARTMENT_OTHER): Payer: Self-pay

## 2021-10-08 ENCOUNTER — Other Ambulatory Visit: Payer: Self-pay | Admitting: Family Medicine

## 2021-10-09 ENCOUNTER — Other Ambulatory Visit (HOSPITAL_BASED_OUTPATIENT_CLINIC_OR_DEPARTMENT_OTHER): Payer: Self-pay

## 2021-10-09 MED ORDER — MELOXICAM 15 MG PO TABS
15.0000 mg | ORAL_TABLET | Freq: Every day | ORAL | 0 refills | Status: DC
  Filled 2021-10-09: qty 30, 30d supply, fill #0

## 2021-10-10 ENCOUNTER — Other Ambulatory Visit (HOSPITAL_BASED_OUTPATIENT_CLINIC_OR_DEPARTMENT_OTHER): Payer: Self-pay

## 2021-10-11 ENCOUNTER — Other Ambulatory Visit (HOSPITAL_BASED_OUTPATIENT_CLINIC_OR_DEPARTMENT_OTHER): Payer: Self-pay

## 2021-10-12 ENCOUNTER — Ambulatory Visit: Payer: 59 | Admitting: Family Medicine

## 2021-10-12 ENCOUNTER — Other Ambulatory Visit (HOSPITAL_BASED_OUTPATIENT_CLINIC_OR_DEPARTMENT_OTHER): Payer: Self-pay

## 2021-10-13 ENCOUNTER — Other Ambulatory Visit (HOSPITAL_BASED_OUTPATIENT_CLINIC_OR_DEPARTMENT_OTHER): Payer: Self-pay

## 2021-10-13 MED ORDER — TELMISARTAN 20 MG PO TABS
ORAL_TABLET | ORAL | 2 refills | Status: DC
  Filled 2021-10-13: qty 30, 30d supply, fill #0
  Filled 2021-11-30: qty 30, 30d supply, fill #1

## 2021-10-13 MED ORDER — WEGOVY 2.4 MG/0.75ML ~~LOC~~ SOAJ
SUBCUTANEOUS | 11 refills | Status: DC
  Filled 2021-10-13 – 2021-11-06 (×2): qty 3, 28d supply, fill #0
  Filled 2021-11-30: qty 3, 28d supply, fill #1
  Filled 2022-01-01: qty 3, 28d supply, fill #2
  Filled 2022-02-01: qty 3, 28d supply, fill #3
  Filled 2022-03-04: qty 3, 28d supply, fill #4
  Filled 2022-03-30: qty 3, 28d supply, fill #5
  Filled 2022-04-26: qty 3, 28d supply, fill #6
  Filled 2022-05-30: qty 3, 28d supply, fill #7

## 2021-10-16 ENCOUNTER — Other Ambulatory Visit (HOSPITAL_BASED_OUTPATIENT_CLINIC_OR_DEPARTMENT_OTHER): Payer: Self-pay

## 2021-10-17 ENCOUNTER — Encounter: Payer: Self-pay | Admitting: Family Medicine

## 2021-10-17 ENCOUNTER — Ambulatory Visit: Payer: 59 | Attending: Family Medicine

## 2021-10-17 ENCOUNTER — Other Ambulatory Visit (HOSPITAL_BASED_OUTPATIENT_CLINIC_OR_DEPARTMENT_OTHER): Payer: Self-pay

## 2021-10-17 DIAGNOSIS — M5459 Other low back pain: Secondary | ICD-10-CM

## 2021-10-17 DIAGNOSIS — N3946 Mixed incontinence: Secondary | ICD-10-CM | POA: Insufficient documentation

## 2021-10-17 DIAGNOSIS — R262 Difficulty in walking, not elsewhere classified: Secondary | ICD-10-CM

## 2021-10-17 DIAGNOSIS — M6281 Muscle weakness (generalized): Secondary | ICD-10-CM

## 2021-10-17 DIAGNOSIS — R279 Unspecified lack of coordination: Secondary | ICD-10-CM | POA: Diagnosis not present

## 2021-10-17 DIAGNOSIS — M533 Sacrococcygeal disorders, not elsewhere classified: Secondary | ICD-10-CM | POA: Insufficient documentation

## 2021-10-17 DIAGNOSIS — R252 Cramp and spasm: Secondary | ICD-10-CM

## 2021-10-17 DIAGNOSIS — R293 Abnormal posture: Secondary | ICD-10-CM | POA: Diagnosis not present

## 2021-10-17 NOTE — Therapy (Signed)
?OUTPATIENT PHYSICAL THERAPY THORACOLUMBAR EVALUATION ? ? ?Patient Name: Carmen [name] ?MRN: [id number] ?DOB:[birth year], 41 y.o., female ?Today's Date: 10/17/2021 ? ? PT End of Session - 10/17/21 4098   ? ? Visit Number 1   ? Date for PT Re-Evaluation 12/12/21   ? Authorization Type UMR   ? PT Start Time 0930   ? PT Stop Time 1015   ? PT Time Calculation (min) 45 min   ? Activity Tolerance Patient tolerated treatment well   ? Behavior During Therapy [facility] for tasks assessed/performed   ? ?  ?  ? ?  ? ? ?Past Medical History:  ?Diagnosis Date  ? Back pain   ? Costochondritis 09/13/2010  ? GERD (gastroesophageal reflux disease)   ? Heart palpitations   ? long history of   ? Heartburn   ? History of chest pain   ? History of fatigue   ? History of kidney stones   ? History of migraine headaches   ? History of tilt table evaluation   ? IMPRESSION: Positive tilt table study with significant cardioinhibitory  response.  ? Hyperlipidemia   ? Hypertension   ? Hypothyroidism   ? Kidney stones   ? history of  ? migraine   ? Neurocardiogenic syncope   ? Palpitations   ? Pericarditis 09/13/2010  ? Presence of permanent cardiac pacemaker   ? PSVT (paroxysmal supraventricular tachycardia) (HCC)   ? Right ureteral calculus   ? Sleep apnea   ? mild osa no cpap needed  ? Sleeping difficulties   ? Stress   ? Stroke (HCC) 06/15/2019  ? no residual deficits, tpa given at time of stroke  ? Stroke [facility])   ? tia jan 2022  ? SVD (spontaneous vaginal delivery)   ? x 1  ? Syncope and collapse   ? neurocardiogenic syncopy started age 34  ? Wears glasses   ? ?Past Surgical History:  ?Procedure Laterality Date  ? ANTERIOR AND POSTERIOR REPAIR N/A 11/14/2017  ? Procedure: ANTERIOR (CYSTOCELE) AND POSTERIOR REPAIR (RECTOCELE);  Surgeon: Silverio Lay, MD;  Location: [facility];  Service: Gynecology;  Laterality: N/A;  ? BILATERAL SALPINGECTOMY Bilateral 11/14/2017  ? Procedure: BILATERAL SALPINGECTOMY;  Surgeon: Silverio Lay, MD;  Location: [facility];   Service: Gynecology;  Laterality: Bilateral;  ? BLADDER SUSPENSION Bilateral 11/14/2017  ? Procedure: TRANSVAGINAL TAPE (TVT) PROCEDURE;  Surgeon: Osborn Coho, MD;  Location: [facility];  Service: Gynecology;  Laterality: Bilateral;  ? CHOLECYSTECTOMY  11/2007  ? Cholecystitis - Laparoscopic cholecystectomy with intraoperative  cholangiogram.  ? CYSTOSCOPY N/A 11/14/2017  ? Procedure: CYSTOSCOPY;  Surgeon: Osborn Coho, MD;  Location: [facility];  Service: Gynecology;  Laterality: N/A;  ? CYSTOSCOPY WITH RETROGRADE PYELOGRAM, URETEROSCOPY AND STENT PLACEMENT Right 06/25/2019  ? Procedure: CYSTOSCOPY WITH RETROGRADE PYELOGRAM, AND RIGHT  STENT PLACEMENT;  Surgeon: Noel Christmas, MD;  Location: [facility];  Service: Urology;  Laterality: Right;  ? CYSTOSCOPY WITH RETROGRADE PYELOGRAM, URETEROSCOPY AND STENT PLACEMENT Right 07/07/2019  ? Procedure: CYSTOSCOPY , URETEROSCOPY AND STENT REPLACEMENT;  Surgeon: Noel Christmas, MD;  Location[facility];  Service: Urology;  Laterality: Right;  ? CYSTOSCOPY WITH RETROGRADE PYELOGRAM, URETEROSCOPY AND STENT PLACEMENT Right 03/23/2021  ? Procedure: CYSTOSCOPY WITH RETROGRADE PYELOGRAM, URETEROSCOPY AND STENT PLACEMENT;  Surgeon: Marcine Matar, MD;  Location: [facility];  Service: Urology;  Laterality: Right;  ? HOLMIUM LASER APPLICATION Right 07/07/2019  ? Procedure: HOLMIUM LASER APPLICATION;  Surgeon: Noel Christmas, MD;  Location:  [facility];  Service: Urology;  Laterality: Right;  ? HOLMIUM LASER APPLICATION Right 03/23/2021  ? Procedure: HOLMIUM LASER APPLICATION;  Surgeon: Marcine Matarahlstedt, Stephen, MD;  [facility];  Service: Urology;  Laterality: Right;  ? PACEMAKER INSERTION  2016  ? Tilt Table Study  07/17/2005  ? Positive tilt table study with significant cardioinhibitory  response.  ? VAGINAL HYSTERECTOMY N/A 11/14/2017  ? Procedure: HYSTERECTOMY VAGINAL;  Surgeon: Silverio Layivard, Sandra, MD;   Location: [facility];  Service: Gynecology;  Laterality: N/A;  ? WISDOM TOOTH EXTRACTION    ? age 41  ? ?Patient Active Problem List  ? Diagnosis Date Noted  ? Somatic dysfunction of spine, sacral 08/24/2021  ? AC joint pain 07/12/2021  ? Bilateral shoulder pain 06/07/2021  ? Acute right-sided weakness 07/25/2020  ? Depression with anxiety 07/25/2020  ? Hypokalemia 07/25/2020  ? Mood disorder (HCC) 07/07/2020  ? Other fatigue 07/07/2020  ? SOBOE (shortness of breath on exertion) 07/07/2020  ? Essential hypertension 07/07/2020  ? Hypothyroidism 07/07/2020  ? Migraine 07/07/2020  ? Vitamin D deficiency 07/07/2020  ? At risk for impaired metabolic function 07/07/2020  ? Hyperlipidemia 06/18/2019  ? Stroke-like episode s/p tPA 06/15/2019  ? Pelvic prolapse 11/17/2017  ? Incontinence of urine in female 11/14/2017  ? PSVT (paroxysmal supraventricular tachycardia) (HCC)   ? Heart palpitations   ? Syncope and collapse   ? Sleeping difficulties   ? Pericarditis 09/13/2010  ? Syncope 09/13/2010  ? Costochondritis 09/13/2010  ? ? ?PCP: Marthenia RollingHowell, Travis, MD ? ?REFERRING PROVIDER: Judi SaaSmith, Zachary M, DO ? ?REFERRING DIAG: M53.3 (ICD-10-CM) - SI (sacroiliac) joint dysfunction  ? ?THERAPY DIAG:  ?Other low back pain ? ?Abnormal posture ? ?Cramp and spasm ? ?Difficulty in walking, not elsewhere classified ? ?Muscle weakness (generalized) ? ?ONSET DATE: 06-25-21 ? ?SUBJECTIVE:                                                                                                                                                                                          ? ?SUBJECTIVE STATEMENT: ?Patient states she has been having back pain her "whole life".  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  She is also having shoulder pain left > right.  She works 30 hours per week.  She is not having pain currently, but at its worst, the pain is 9-10/10.  She hopes to learn why her pain recurs and learn how to manage her back pain.   ? ?PERTINENT  HISTORY:  ?See above ? ?PAIN:  ?Are you having pain? Yes: NPRS scale: 0/10 ?Pain location: typically low back /S.I.  area but not hurting currently ?Pain description: na ?Aggravating factors: standing/working ?Relieving factors: medication and rest ? ? ?PRECAUTIONS: None ? ?WEIGHT BEARING RESTRICTIONS No ? ?FALLS:  ?Has patient fallen in last 6 months? No ? ?LIVING ENVIRONMENT: ?Lives with: lives with their family ?Lives in: House/apartment ?Stairs: No ?Has following equipment at home: None ? ?OCCUPATION: Mammographer ? ?PLOF: Independent ? ?PATIENT GOALS  ? She hopes to learn why her pain recurs and learn how to manage her back pain.   ? ?OBJECTIVE:  ? ?DIAGNOSTIC FINDINGS:  ?None found ? ?PATIENT SURVEYS:  ?FOTO complete next visit ? ?SCREENING FOR RED FLAGS: ?Bowel or bladder incontinence: Yes: due to pelvic floor weakness ?Spinal tumors: No ?Cauda equina syndrome: No ?Compression fracture: No ?Abdominal aneurysm: No ? ?COGNITION: ? Overall cognitive status: Within functional limits for tasks assessed   ?  ?SENSATION: ?WFL ? ?MUSCLE LENGTH: ?Hamstrings: Right 50 deg; Left 50 deg ?Thomas test: Right post ; Left pos  ? ?POSTURE:  ?Decreased lumbar lordosis ? ?PALPATION: ?na ? ?LUMBAR ROM:  ? ?Active  A/PROM  ?10/17/2021  ?Flexion Fingertips to midshin  ?Extension wnl  ?Right lateral flexion Fingertiips to joint line  ?Left lateral flexion Fingertips to joint line  ?Right rotation wnl  ?Left rotation wnl  ? (Blank rows = not tested) ? ?LE ROM: ? WNL ?  ? ?LE MMT: ?Genrally 4/5 bilateral LE's ? ? ?LUMBAR SPECIAL TESTS:  ?Stork standing: Negative and Thomas test: Positive ? ? ? ?GAIT: ?Distance walked: 50 ?Assistive device utilized: None ?Level of assistance: Complete Independence ?Comments: non antalgic normal heel to toe progression ? ? ? ?TODAY'S TREATMENT  ?Initial eval completed and initiated HEP ? ? ?PATIENT EDUCATION:  ?Education details: Initiated HEP and educated on S.I. joint dysfunction vs. Lumbar stenosis,  proper body mechanics for working in her yard, also discussed and educated on DN ?Person educated: Patient ?Education method: Explanation, Demonstration, Verbal cues, and Handouts ?Education comprehension: Futures trader

## 2021-10-18 ENCOUNTER — Other Ambulatory Visit (HOSPITAL_BASED_OUTPATIENT_CLINIC_OR_DEPARTMENT_OTHER): Payer: Self-pay

## 2021-10-19 ENCOUNTER — Ambulatory Visit: Payer: 59 | Admitting: Physical Therapy

## 2021-10-19 ENCOUNTER — Other Ambulatory Visit (HOSPITAL_BASED_OUTPATIENT_CLINIC_OR_DEPARTMENT_OTHER): Payer: Self-pay

## 2021-10-19 DIAGNOSIS — R293 Abnormal posture: Secondary | ICD-10-CM | POA: Diagnosis not present

## 2021-10-19 DIAGNOSIS — N3946 Mixed incontinence: Secondary | ICD-10-CM

## 2021-10-19 DIAGNOSIS — M533 Sacrococcygeal disorders, not elsewhere classified: Secondary | ICD-10-CM | POA: Diagnosis not present

## 2021-10-19 DIAGNOSIS — M5459 Other low back pain: Secondary | ICD-10-CM | POA: Diagnosis not present

## 2021-10-19 DIAGNOSIS — M6281 Muscle weakness (generalized): Secondary | ICD-10-CM | POA: Diagnosis not present

## 2021-10-19 DIAGNOSIS — R262 Difficulty in walking, not elsewhere classified: Secondary | ICD-10-CM | POA: Diagnosis not present

## 2021-10-19 DIAGNOSIS — R279 Unspecified lack of coordination: Secondary | ICD-10-CM | POA: Diagnosis not present

## 2021-10-19 NOTE — Patient Instructions (Addendum)
Urge Incontinence  Ideal urination frequency is every 2-4 wakeful hours, which equates to 5-8 times within a 24-hour period.   Urge incontinence is leakage that occurs when the bladder muscle contracts, creating a sudden need to go before getting to the bathroom.   Going too often when your bladder isn't actually full can disrupt the body's automatic signals to store and hold urine longer, which will increase urgency/frequency.  In this case, the bladder "is running the show" and strategies can be learned to retrain this pattern.   One should be able to control the first urge to urinate, at around 150mL.  The bladder can hold up to a "grande latte," or 400mL. To help you gain control, practice the Urge Drill below when urgency strikes.  This drill will help retrain your bladder signals and allow you to store and hold urine longer.  The overall goal is to stretch out your time between voids to reach a more manageable voiding schedule.    Practice your "quick flicks" often throughout the day (each waking hour) even when you don't need feel the urge to go.  This will help strengthen your pelvic floor muscles, making them more effective in controlling leakage.  Urge Drill  When you feel an urge to go, follow these steps to regain control: Stop what you are doing and be still Take one deep breath, directing your air into your abdomen Think an affirming thought, such as "I've got this." Do 5 quick flicks of your pelvic floor Walk with control to the bathroom to void, or delay voiding   Bladder Irritants  Certain foods and beverages can be irritating to the bladder.  Avoiding these irritants may decrease your symptoms of urinary urgency, frequency or bladder pain.  Even reducing your intake can help with your symptoms.  Not everyone is sensitive to all bladder irritants, so you may consider focusing on one irritant at a time, removing or reducing your intake of that irritant for 7-10 days to see if  this change helps your symptoms.  Water intake is also very important.  Below is a list of bladder irritants.  Drinks: alcohol, carbonated beverages, caffeinated beverages such as coffee and tea, drinks with artificial sweeteners, citrus juices, apple juice, tomato juice  Foods: tomatoes and tomato based foods, spicy food, sugar and artificial sweeteners, vinegar, chocolate, raw onion, apples, citrus fruits, pineapple, cranberries, tomatoes, strawberries, plums, peaches, cantaloupe  Other: acidic urine (too concentrated) - see water intake info below  Substitutes you can try that are NOT irritating to the bladder: cooked onion, pears, papayas, sun-brewed decaf teas, watermelons, non-citrus herbal teas, apricots, kava and low-acid instant drinks (Postum).    WATER INTAKE: Remember to drink lots of water (aim for fluid intake of half your body weight with 2/3 of fluids being water).  You may be limiting fluids due to fear of leakage, but this can actually worsen urgency symptoms due to highly concentrated urine.  Water helps balance the pH of your urine so it doesn't become too acidic - acidic urine is a bladder irritant!    

## 2021-10-19 NOTE — Therapy (Addendum)
OUTPATIENT PHYSICAL THERAPY FEMALE PELVIC EVALUATION   Patient Name: Carmen [name] MRN: [id number] DOB:1981-02-25, 41 y.o., female Today's Date: 10/19/2021    Past Medical History:  Diagnosis Date   Back pain    Costochondritis 09/13/2010   GERD (gastroesophageal reflux disease)    Heart palpitations    long history of    Heartburn    History of chest pain    History of fatigue    History of kidney stones    History of migraine headaches    History of tilt table evaluation    IMPRESSION: Positive tilt table study with significant cardioinhibitory  response.   Hyperlipidemia    Hypertension    Hypothyroidism    Kidney stones    history of   migraine    Neurocardiogenic syncope    Palpitations    Pericarditis 09/13/2010   Presence of permanent cardiac pacemaker    PSVT (paroxysmal supraventricular tachycardia) ([place])    Right ureteral calculus    Sleep apnea    mild osa no cpap needed   Sleeping difficulties    Stress    Stroke ([place]) 06/15/2019   no residual deficits, tpa given at time of stroke   Stroke [facility])    tia jan 2022   SVD (spontaneous vaginal delivery)    x 1   Syncope and collapse    neurocardiogenic syncopy started age 83   Wears glasses    Past Surgical History:  Procedure Laterality Date   ANTERIOR AND POSTERIOR REPAIR N/A 11/14/2017   Procedure: ANTERIOR (CYSTOCELE) AND POSTERIOR REPAIR (RECTOCELE);  Surgeon: Delsa Bern, MD;  Location: [place] [facility];  Service: Gynecology;  Laterality: N/A;   BILATERAL SALPINGECTOMY Bilateral 11/14/2017   Procedure: BILATERAL SALPINGECTOMY;  Surgeon: Delsa Bern, MD;  Location: [place] [facility];  Service: Gynecology;  Laterality: Bilateral;   BLADDER SUSPENSION Bilateral 11/14/2017   Procedure: TRANSVAGINAL TAPE (TVT) PROCEDURE;  Surgeon: Everett Graff, MD;  Location: [place] [facility];  Service: Gynecology;  Laterality: Bilateral;   CHOLECYSTECTOMY  11/2007   Cholecystitis - Laparoscopic cholecystectomy with intraoperative   cholangiogram.   CYSTOSCOPY N/A 11/14/2017   Procedure: CYSTOSCOPY;  Surgeon: Everett Graff, MD;  Location: [place] [facility];  Service: Gynecology;  Laterality: N/A;   CYSTOSCOPY WITH RETROGRADE PYELOGRAM, URETEROSCOPY AND STENT PLACEMENT Right 06/25/2019   Procedure: CYSTOSCOPY WITH RETROGRADE PYELOGRAM, AND RIGHT  STENT PLACEMENT;  Surgeon: Robley Fries, MD;  [facility];  Service: Urology;  Laterality: Right;   CYSTOSCOPY WITH RETROGRADE PYELOGRAM, URETEROSCOPY AND STENT PLACEMENT Right 07/07/2019   Procedure: CYSTOSCOPY , URETEROSCOPY AND STENT REPLACEMENT;  Surgeon: Robley Fries, MD;  [facility];  Service: Urology;  Laterality: Right;   CYSTOSCOPY WITH RETROGRADE PYELOGRAM, URETEROSCOPY AND STENT PLACEMENT Right 03/23/2021   Procedure: CYSTOSCOPY WITH RETROGRADE PYELOGRAM, URETEROSCOPY AND STENT PLACEMENT;  Surgeon: Franchot Gallo, MD;  Location: [facility];  Service: Urology;  Laterality: Right;   HOLMIUM LASER APPLICATION Right 96/78/9381   Procedure: HOLMIUM LASER APPLICATION;  Surgeon: Robley Fries, MD;  Location: [facility];  Service: Urology;  Laterality: Right;   HOLMIUM LASER APPLICATION Right 0/17/5102   Procedure: HOLMIUM LASER APPLICATION;  Surgeon: Franchot Gallo, MD;  Location: [facility];  Service: Urology;  Laterality: Right;   PACEMAKER INSERTION  2016   Tilt Table Study  07/17/2005   Positive tilt table study with significant cardioinhibitory  response.   VAGINAL HYSTERECTOMY N/A 11/14/2017   Procedure: HYSTERECTOMY VAGINAL;  Surgeon: Delsa Bern, MD;  Location: [place] [facility];  Service: Gynecology;  Laterality: N/A;   WISDOM TOOTH EXTRACTION     age 60   Patient Active Problem List   Diagnosis Date Noted   Somatic dysfunction of spine, sacral 08/24/2021   AC joint pain 07/12/2021   Bilateral shoulder pain 06/07/2021   Acute right-sided weakness 07/25/2020   Depression with anxiety  07/25/2020   Hypokalemia 07/25/2020   Mood disorder ([place]) 07/07/2020   Other fatigue 07/07/2020   SOBOE (shortness of breath on exertion) 07/07/2020   Essential hypertension 07/07/2020   Hypothyroidism 07/07/2020   Migraine 07/07/2020   Vitamin D deficiency 07/07/2020   At risk for impaired metabolic function 76/28/3151   Hyperlipidemia 06/18/2019   Stroke-like episode s/p tPA 06/15/2019   Pelvic prolapse 11/17/2017   Incontinence of urine in female 11/14/2017   PSVT (paroxysmal supraventricular tachycardia) ([place])    Heart palpitations    Syncope and collapse    Sleeping difficulties    Pericarditis 09/13/2010   Syncope 09/13/2010   Costochondritis 09/13/2010    PCP: Danton Sewer, MD  REFERRING PROVIDER: Lyndal Pulley, DO  REFERRING DIAG: M53.3 (ICD-10-CM) - SI (sacroiliac) joint dysfunction   THERAPY DIAG:  Muscle weakness (generalized)  Unspecified lack of coordination  Abnormal posture  Mixed incontinence  ONSET DATE: several years  SUBJECTIVE:                                                                                                                                                                                           SUBJECTIVE STATEMENT: Pt reports she has had some intermittent back pain at SIJ mostly at Rt side. Pt reports she has a chronic history of urinary incontinence with stress and urgency. Pt reports she has had a full loss of bladder previously. Pt has had repair for anterior and posterior repair. Pt has attempted biofeedback previously but reports she is unable contract pelvic floor well.   Fluid intake: Yes: 150oz water  and one cup of coffee  Patient confirms identification and approves PT to assess pelvic floor and treatment Yes   PAIN:  Are you having pain? Yes (not right now)  NPRS scale: 10/10 Pain location:  all low Rt side of back  Pain type: deep sharp Pain description:  "stuck"    Aggravating factors: laying down too  long, sleeping, bending over for longer periods of time, lifting Relieving factors: rest/time  PRECAUTIONS: Other: pacemaker  WEIGHT BEARING RESTRICTIONS No  FALLS:  Has patient fallen in last 6 months? No  LIVING ENVIRONMENT: Lives with: lives with their family Lives in: House/apartment   OCCUPATION: does mammograms   PLOF:  Independent  PATIENT GOALS to have less pain and leakage  PERTINENT HISTORY:  Chronic UTIs, mild constipation, hysterectomy partial  Sexual abuse: No  BOWEL MOVEMENT Pain with bowel movement: No Type of bowel movement:Type (Bristol Stool Scale) 5-6, Frequency daily, and Strain No Fully empty rectum: Yes:   Leakage: No Pads: No Fiber supplement: No but does take magnesium   URINATION Pain with urination: No Fully empty bladder: No Stream:  takes a little while to get started with/without urgency Urgency: Yes:   Frequency: at least once an hour, sometimes quicker; at least 2x night time voids Leakage: Urge to void, Walking to the bathroom, Coughing, Sneezing, Laughing, Exercise, and Lifting Pads: No  INTERCOURSE Pain with intercourse:  not painful, sometimes will have dryness Ability to have vaginal penetration:  Yes:   Climax: no pain Marinoff Scale: 0/3  PREGNANCY Vaginal deliveries 1 Tearing Yes: episiotomy  C-section deliveries 0 Currently pregnant No  PROLAPSE Has a history of prolapse anterior and posterior repair    OBJECTIVE:   DIAGNOSTIC FINDINGS:    COGNITION:  Overall cognitive status: Within functional limits for tasks assessed     SENSATION:  Light touch: Appears intact  Proprioception: Appears intact  MUSCLE LENGTH: Bil hamstrings and adductors limited 25%  POSTURE:  Rounded shoulders, posterior pelvic tilt  LUMBARAROM/PROM  50% restriction in flexion, 25% in bil side bending, and WFL rotation and extension  LE ROM: WFL  LE MMT:  Hip abduction bil 3+/5; all others 4/5; knees and ankles 5/5  PELVIC  MMT:   MMT  10/19/2021  Vaginal 0/5; 0s; 0 reps  Internal Anal Sphincter   External Anal Sphincter   Puborectalis   Diastasis Recti   (Blank rows = not tested)        PALPATION:   General  moderate fascial restrictions throughout abdomen; TTP at lower Lt quadrant                External Perineal Exam no TTP, redness noted                              Internal Pelvic Floor no TTP  TONE: decreased  PROLAPSE: Mild tissue descent noted anteriorly but not visualized in hooklying with coughing  TODAY'S TREATMENT  10/19/2021 EVAL Examination completed, findings reviewed, pt educated on POC, HEP given. Pt motivated to participate in PT and agreeable to attempt recommendations. No treatment provided.    PATIENT EDUCATION:  Education details: Engineer, site Person educated: Patient Education method: Consulting civil engineer, Demonstration, Corporate treasurer cues, Verbal cues, and Handouts Education comprehension: verbalized understanding and returned demonstration   HOME EXERCISE PROGRAM: [id number]  ASSESSMENT:  CLINICAL IMPRESSION: Patient is a 41 y.o. female  who was seen today for physical therapy evaluation and treatment for pelvic floor weakness, urinary leakage and low back pain. Pt found to have poor core strength, decreased mobility in spine and bil hips, decreased hip strength, impaired breathing mechanics, and restrictions felt throughout abdomen. Pt consented to internal vaginal assessment this date and found to have decreased strength, coordination, and endurance. Pt greatly limited with poor coordination and activation of pelvic floor.  Pt would benefit from additional PT to further address deficits.     OBJECTIVE IMPAIRMENTS decreased coordination, decreased endurance, decreased strength, increased fascial restrictions, increased muscle spasms, impaired flexibility, improper body mechanics, postural dysfunction, and pain.   ACTIVITY LIMITATIONS community activity, occupation, and yard work.    PERSONAL FACTORS Past/current experiences, Time since  onset of injury/illness/exacerbation, and 3+ comorbidities: past prolapse repair, partial hysterectomy, and vaginal birth with episiotomy   are also affecting patient's functional outcome.    REHAB POTENTIAL: Good  CLINICAL DECISION MAKING: Stable/uncomplicated  EVALUATION COMPLEXITY: Low   GOALS: Goals reviewed with patient? Yes  SHORT TERM GOALS: Target date: 11/16/2021  Pt to be I with HEP. Baseline: given at eval Goal status: INITIAL  2.  Pt will have less urgency due to bladder retraining and strengthening at least 50% of the time.  Baseline: 100% of the time with urgency Goal status: INITIAL  3.  Pt to demonstrate at least 2/5 pelvic floor strength for improved pelvic stability and decreased strain at pelvic floor/ decrease leakage.  Baseline: 0/5 Goal status: INITIAL  4.  Pt to demonstrate improved coordination of pelvic floor and breathing mechanics with functional tasks for decreased leakage and strain at pelvic floor at least 50% of the time.  Baseline: unable  Goal status: INITIAL    LONG TERM GOALS: Target date: 12/12/2021  Pt to be I with advanced HEP. Baseline:  Goal status: INITIAL  2.  Pt will have less urgency due to bladder retraining and strengthening at least 75% of the time.  Baseline: 100% of the time with urgency Goal status: INITIAL  3.  Pt to demonstrate at least 3/5 pelvic floor strength for improved pelvic stability and decreased strain at pelvic floor/ decrease leakage.  Baseline: 0/5 Goal status: INITIAL  4.  Pt to demonstrate improved coordination of pelvic floor and breathing mechanics with functional tasks for decreased leakage and strain at pelvic floor at least 75% of the time.  Baseline: unable Goal status: INITIAL  5.  Pt to demonstrate at least 5/5 bil hip strength for improved pelvic stability and functional squats without leakage.  Baseline: 3+/5 hip abductions Goal  status: INITIAL    PLAN: PT FREQUENCY: 1x/week  PT DURATION:  10 sessions  PLANNED INTERVENTIONS: Therapeutic exercises, Therapeutic activity, Neuromuscular re-education, Patient/Family education, Joint mobilization, Spinal mobilization, Cryotherapy, Moist heat, Manual lymph drainage, scar mobilization, Taping, Biofeedback, and Manual therapy  PLAN FOR NEXT SESSION: coordination of pelvic floor and strengthening with breathing   Stacy Gardner, PT, DPT 04/27/231:32 PM  PHYSICAL THERAPY DISCHARGE SUMMARY  Visits from Start of Care: 2  Current functional level related to goals / functional outcomes: Unable to formally reassess as pt has been unable to return since last visit.    Remaining deficits: Unable to formally reassess    Education / Equipment: HEP   Patient agrees to discharge. Patient goals were not met. Patient is being discharged due to not returning since the last visit.   Stacy Gardner, PT, DPT 08/30/234:06 PM

## 2021-10-20 ENCOUNTER — Encounter: Payer: Self-pay | Admitting: Physical Therapy

## 2021-10-20 ENCOUNTER — Other Ambulatory Visit (HOSPITAL_BASED_OUTPATIENT_CLINIC_OR_DEPARTMENT_OTHER): Payer: Self-pay

## 2021-10-23 ENCOUNTER — Other Ambulatory Visit (HOSPITAL_BASED_OUTPATIENT_CLINIC_OR_DEPARTMENT_OTHER): Payer: Self-pay

## 2021-10-24 ENCOUNTER — Other Ambulatory Visit (HOSPITAL_BASED_OUTPATIENT_CLINIC_OR_DEPARTMENT_OTHER): Payer: Self-pay

## 2021-10-25 ENCOUNTER — Other Ambulatory Visit (HOSPITAL_BASED_OUTPATIENT_CLINIC_OR_DEPARTMENT_OTHER): Payer: Self-pay

## 2021-10-26 ENCOUNTER — Other Ambulatory Visit (HOSPITAL_BASED_OUTPATIENT_CLINIC_OR_DEPARTMENT_OTHER): Payer: Self-pay

## 2021-10-26 DIAGNOSIS — Z45018 Encounter for adjustment and management of other part of cardiac pacemaker: Secondary | ICD-10-CM | POA: Diagnosis not present

## 2021-10-27 ENCOUNTER — Other Ambulatory Visit (HOSPITAL_BASED_OUTPATIENT_CLINIC_OR_DEPARTMENT_OTHER): Payer: Self-pay

## 2021-11-01 ENCOUNTER — Encounter: Payer: 59 | Admitting: Physical Therapy

## 2021-11-06 ENCOUNTER — Other Ambulatory Visit (HOSPITAL_BASED_OUTPATIENT_CLINIC_OR_DEPARTMENT_OTHER): Payer: Self-pay

## 2021-11-07 ENCOUNTER — Ambulatory Visit: Payer: 59 | Attending: Family Medicine | Admitting: Physical Therapy

## 2021-11-07 ENCOUNTER — Telehealth: Payer: Self-pay | Admitting: Physical Therapy

## 2021-11-07 DIAGNOSIS — M6281 Muscle weakness (generalized): Secondary | ICD-10-CM | POA: Insufficient documentation

## 2021-11-07 DIAGNOSIS — M5459 Other low back pain: Secondary | ICD-10-CM | POA: Insufficient documentation

## 2021-11-07 DIAGNOSIS — M533 Sacrococcygeal disorders, not elsewhere classified: Secondary | ICD-10-CM | POA: Insufficient documentation

## 2021-11-07 DIAGNOSIS — R279 Unspecified lack of coordination: Secondary | ICD-10-CM | POA: Insufficient documentation

## 2021-11-07 DIAGNOSIS — R293 Abnormal posture: Secondary | ICD-10-CM | POA: Insufficient documentation

## 2021-11-07 DIAGNOSIS — R262 Difficulty in walking, not elsewhere classified: Secondary | ICD-10-CM | POA: Insufficient documentation

## 2021-11-07 DIAGNOSIS — N3946 Mixed incontinence: Secondary | ICD-10-CM | POA: Insufficient documentation

## 2021-11-07 NOTE — Telephone Encounter (Signed)
PT called pt about this morning's appointment at 0800. Pt did not answer, voicemail left.   ? ?Stacy Gardner, PT, DPT ?05/16/239:03 AM  ?

## 2021-11-14 ENCOUNTER — Other Ambulatory Visit (HOSPITAL_BASED_OUTPATIENT_CLINIC_OR_DEPARTMENT_OTHER): Payer: Self-pay

## 2021-11-17 DIAGNOSIS — G90A Postural orthostatic tachycardia syndrome (POTS): Secondary | ICD-10-CM | POA: Diagnosis not present

## 2021-11-17 DIAGNOSIS — R55 Syncope and collapse: Secondary | ICD-10-CM | POA: Diagnosis not present

## 2021-11-17 DIAGNOSIS — Z950 Presence of cardiac pacemaker: Secondary | ICD-10-CM | POA: Diagnosis not present

## 2021-11-23 ENCOUNTER — Encounter: Payer: 59 | Admitting: Physical Therapy

## 2021-11-29 NOTE — Progress Notes (Signed)
[name] [address] [place] [address] Phone: [phone] Subjective:   [name], am serving as a scribe for Dr. Antoine Primas.   I'm seeing this patient by the request  of:  Marthenia Rolling, MD  CC: back pain and shoulder pain   [id number]  C[name] is a 41 y.o. female coming in with complaint of back and neck pain. OMT on 08/23/2021. Also seen for shoulder pain. Patient states thinks shingles in a new place. Shoulder pain has improved. Has to pop to feel better. No other complaints.  Medications patient has been prescribed: None  Taking:  Left shoulder xray mild spurring subacromial area  Left AC joint was injected January 18       Reviewed prior external information including notes and imaging from previsou exam, outside providers and external EMR if available.   As well as notes that were available from care everywhere and other healthcare systems.  Past medical history, social, surgical and family history all reviewed in electronic medical record.  No pertanent information unless stated regarding to the chief complaint.   Past Medical History:  Diagnosis Date   Back pain    Costochondritis 09/13/2010   GERD (gastroesophageal reflux disease)    Heart palpitations    long history of    Heartburn    History of chest pain    History of fatigue    History of kidney stones    History of migraine headaches    History of tilt table evaluation    IMPRESSION: Positive tilt table study with significant cardioinhibitory  response.   Hyperlipidemia    Hypertension    Hypothyroidism    Kidney stones    history of   migraine    Neurocardiogenic syncope    Palpitations    Pericarditis 09/13/2010   Presence of permanent cardiac pacemaker    PSVT (paroxysmal supraventricular tachycardia) (HCC)    Right ureteral calculus    Sleep apnea    mild osa no cpap needed   Sleeping difficulties    Stress    Stroke (HCC)  06/15/2019   no residual deficits, tpa given at time of stroke   Stroke [facility])    tia jan 2022   SVD (spontaneous vaginal delivery)    x 1   Syncope and collapse    neurocardiogenic syncopy started age 24   Wears glasses     Allergies  Allergen Reactions   Tape Hives    Adhesive tape - tegaderm/paper tape ok   Dilaudid [Hydromorphone Hcl] Itching    "I just about crawled out of my skin"   Prochlorperazine Other (See Comments)    Akathisia with compazine in ED on 05/17/19.  Resolved with repeat dose of benadryl.      Quinolones Nausea Only   Reglan [Metoclopramide] Other (See Comments)    flinging legs and "not feeling right".      Review of Systems:  No headache, visual changes, nausea, vomiting, diarrhea, constipation, dizziness, abdominal pain, skin rash, fevers, chills, night sweats, weight loss, swollen lymph nodes,  joint swelling, chest pain, shortness of breath,. POSITIVE muscle aches, back aches, mood changes  Objective  Blood pressure 122/90, pulse (!) 114, height 5\' 2"  (1.575 m), weight 181 lb (82.1 kg), SpO2 95 %.   General: No apparent distress alert and oriented x3 mood and affect normal, dressed appropriately.  Does seem a little more stress HEENT: Pupils equal, extraocular movements intact  Respiratory: Patient's  speak in full sentences and does not appear short of breath  Cardiovascular: No lower extremity edema, non tender, no erythema  Patient does have appears on the extensor surface of the arms noted.  Patient is even painful to light palpation.  Left shoulder does have very mild positive impingement noted.  Limited muscular skeletal ultrasound was performed and interpreted by Antoine Primas, M  Limited ultrasound of patient's left shoulder shows no significant hypoechoic changes at this time.  Rotator cuff appears to be intact.  Still has a spur noted in the subacromial area Impression normal shoulder     Assessment and Plan:  Depression with  anxiety Patient is having hives at the moment.  I do believe that this is secondary to some of the increase in anxiety and stress the patient is having with the move as well as with her family.  We discussed at the little great detail that I do feel that this could be also exacerbating some of the pain she is feeling as well.  At this point patient would be willing to try the Cymbalta.  Discussed potentially discontinuing the nortriptyline and we will see how patient responds.  Patient knows of potential side effects.  Follow-up with me again in 6 weeks but will be giving me an update in 2 weeks.         The above documentation has been reviewed and is accurate and complete [name], DO        Note: This dictation was prepared with Dragon dictation along with smaller phrase technology. Any transcriptional errors that result from this process are unintentional.

## 2021-11-30 ENCOUNTER — Ambulatory Visit: Payer: Self-pay

## 2021-11-30 ENCOUNTER — Encounter: Payer: 59 | Admitting: Physical Therapy

## 2021-11-30 ENCOUNTER — Ambulatory Visit (INDEPENDENT_AMBULATORY_CARE_PROVIDER_SITE_OTHER): Payer: 59 | Admitting: Family Medicine

## 2021-11-30 ENCOUNTER — Other Ambulatory Visit (HOSPITAL_BASED_OUTPATIENT_CLINIC_OR_DEPARTMENT_OTHER): Payer: Self-pay

## 2021-11-30 VITALS — BP 122/90 | HR 114 | Ht 62.0 in | Wt 181.0 lb

## 2021-11-30 DIAGNOSIS — G8929 Other chronic pain: Secondary | ICD-10-CM | POA: Diagnosis not present

## 2021-11-30 DIAGNOSIS — M25511 Pain in right shoulder: Secondary | ICD-10-CM

## 2021-11-30 DIAGNOSIS — M25512 Pain in left shoulder: Secondary | ICD-10-CM | POA: Diagnosis not present

## 2021-11-30 DIAGNOSIS — F418 Other specified anxiety disorders: Secondary | ICD-10-CM | POA: Diagnosis not present

## 2021-11-30 MED ORDER — DULOXETINE HCL 20 MG PO CPEP
20.0000 mg | ORAL_CAPSULE | Freq: Every day | ORAL | 0 refills | Status: DC
  Filled 2021-11-30: qty 30, 30d supply, fill #0

## 2021-11-30 MED ORDER — TRIAMCINOLONE ACETONIDE 0.5 % EX CREA
1.0000 "application " | TOPICAL_CREAM | Freq: Two times a day (BID) | CUTANEOUS | 3 refills | Status: DC
  Filled 2021-11-30: qty 45, 30d supply, fill #0

## 2021-11-30 MED ORDER — METOPROLOL SUCCINATE ER 100 MG PO TB24
ORAL_TABLET | ORAL | 0 refills | Status: DC
  Filled 2021-11-30: qty 90, 90d supply, fill #0

## 2021-11-30 NOTE — Assessment & Plan Note (Signed)
Patient is having hives at the moment.  I do believe that this is secondary to some of the increase in anxiety and stress the patient is having with the move as well as with her family.  We discussed at the little great detail that I do feel that this could be also exacerbating some of the pain she is feeling as well.  At this point patient would be willing to try the Cymbalta.  Discussed potentially discontinuing the nortriptyline and we will see how patient responds.  Patient knows of potential side effects.  Follow-up with me again in 6 weeks but will be giving me an update in 2 weeks.

## 2021-11-30 NOTE — Patient Instructions (Addendum)
Cymbalta 20mg   Stop acyclovir and nortriptyline Cream to help with sensitivity of skin Shoulder looks decent See you again in 6-8 weeks

## 2021-12-01 ENCOUNTER — Other Ambulatory Visit (HOSPITAL_BASED_OUTPATIENT_CLINIC_OR_DEPARTMENT_OTHER): Payer: Self-pay

## 2021-12-01 MED ORDER — DIAZEPAM 5 MG PO TABS
ORAL_TABLET | ORAL | 1 refills | Status: DC
  Filled 2021-12-01: qty 70, 24d supply, fill #0
  Filled 2022-01-01: qty 70, 24d supply, fill #1

## 2021-12-05 ENCOUNTER — Encounter: Payer: 59 | Admitting: Physical Therapy

## 2021-12-06 ENCOUNTER — Other Ambulatory Visit (HOSPITAL_BASED_OUTPATIENT_CLINIC_OR_DEPARTMENT_OTHER): Payer: Self-pay

## 2021-12-06 DIAGNOSIS — G90A Postural orthostatic tachycardia syndrome (POTS): Secondary | ICD-10-CM | POA: Diagnosis not present

## 2021-12-06 DIAGNOSIS — I10 Essential (primary) hypertension: Secondary | ICD-10-CM | POA: Diagnosis not present

## 2021-12-06 DIAGNOSIS — Z45018 Encounter for adjustment and management of other part of cardiac pacemaker: Secondary | ICD-10-CM | POA: Diagnosis not present

## 2021-12-06 MED ORDER — METOPROLOL SUCCINATE ER 50 MG PO TB24
ORAL_TABLET | ORAL | 3 refills | Status: DC
  Filled 2021-12-06: qty 270, 90d supply, fill #0
  Filled 2022-01-01: qty 135, 45d supply, fill #0
  Filled 2022-04-02: qty 135, 45d supply, fill #1

## 2021-12-07 ENCOUNTER — Encounter: Payer: 59 | Admitting: Physical Therapy

## 2021-12-14 ENCOUNTER — Other Ambulatory Visit (HOSPITAL_BASED_OUTPATIENT_CLINIC_OR_DEPARTMENT_OTHER): Payer: Self-pay

## 2021-12-14 ENCOUNTER — Encounter: Payer: 59 | Admitting: Physical Therapy

## 2021-12-18 ENCOUNTER — Other Ambulatory Visit (HOSPITAL_BASED_OUTPATIENT_CLINIC_OR_DEPARTMENT_OTHER): Payer: Self-pay

## 2021-12-19 ENCOUNTER — Encounter: Payer: 59 | Admitting: Physical Therapy

## 2021-12-25 ENCOUNTER — Encounter: Payer: Self-pay | Admitting: Family Medicine

## 2021-12-28 ENCOUNTER — Encounter: Payer: 59 | Admitting: Physical Therapy

## 2022-01-01 ENCOUNTER — Other Ambulatory Visit (HOSPITAL_BASED_OUTPATIENT_CLINIC_OR_DEPARTMENT_OTHER): Payer: Self-pay

## 2022-01-01 ENCOUNTER — Encounter: Payer: 59 | Admitting: Physical Therapy

## 2022-01-02 ENCOUNTER — Other Ambulatory Visit (HOSPITAL_BASED_OUTPATIENT_CLINIC_OR_DEPARTMENT_OTHER): Payer: Self-pay

## 2022-01-02 MED ORDER — LEVOTHYROXINE SODIUM 100 MCG PO TABS
100.0000 ug | ORAL_TABLET | Freq: Every day | ORAL | 0 refills | Status: DC
  Filled 2022-01-02: qty 30, 30d supply, fill #0

## 2022-01-04 ENCOUNTER — Encounter: Payer: 59 | Admitting: Physical Therapy

## 2022-01-09 ENCOUNTER — Other Ambulatory Visit (HOSPITAL_BASED_OUTPATIENT_CLINIC_OR_DEPARTMENT_OTHER): Payer: Self-pay

## 2022-01-10 NOTE — Progress Notes (Deleted)
[name] [address] [place] [address] Phone: [phone] Subjective:    I'm seeing this patient by the request  of:  Carmen Rolling, MD  CC:   [id number]  11/30/2021 Patient is having hives at the moment.  I do believe that this is secondary to some of the increase in anxiety and stress the patient is having with the move as well as with her family.  We discussed at the little great detail that I do feel that this could be also exacerbating some of the pain she is feeling as well.  At this point patient would be willing to try the Cymbalta.  Discussed potentially discontinuing the nortriptyline and we will see how patient responds.  Patient knows of potential side effects.  Follow-up with me again in 6 weeks but will be giving me an update in 2 weeks.  Updated 01/11/2022 [name] is a 41 y.o. female coming in with complaint of bilateral shoulder pain       Past Medical History:  Diagnosis Date   Back pain    Costochondritis 09/13/2010   GERD (gastroesophageal reflux disease)    Heart palpitations    long history of    Heartburn    History of chest pain    History of fatigue    History of kidney stones    History of migraine headaches    History of tilt table evaluation    IMPRESSION: Positive tilt table study with significant cardioinhibitory  response.   Hyperlipidemia    Hypertension    Hypothyroidism    Kidney stones    history of   migraine    Neurocardiogenic syncope    Palpitations    Pericarditis 09/13/2010   Presence of permanent cardiac pacemaker    PSVT (paroxysmal supraventricular tachycardia) (HCC)    Right ureteral calculus    Sleep apnea    mild osa no cpap needed   Sleeping difficulties    Stress    Stroke (HCC) 06/15/2019   no residual deficits, tpa given at time of stroke   Stroke [facility])    tia jan 2022   SVD (spontaneous vaginal delivery)    x 1   Syncope and collapse    neurocardiogenic  syncopy started age 89   Wears glasses    Past Surgical History:  Procedure Laterality Date   ANTERIOR AND POSTERIOR REPAIR N/A 11/14/2017   Procedure: ANTERIOR (CYSTOCELE) AND POSTERIOR REPAIR (RECTOCELE);  Surgeon: Carmen Lay, MD;  Location: [facility];  Service: Gynecology;  Laterality: N/A;   BILATERAL SALPINGECTOMY Bilateral 11/14/2017   Procedure: BILATERAL SALPINGECTOMY;  Surgeon: Carmen Lay, MD;  Location: [facility];  Service: Gynecology;  Laterality: Bilateral;   BLADDER SUSPENSION Bilateral 11/14/2017   Procedure: TRANSVAGINAL TAPE (TVT) PROCEDURE;  Surgeon: Carmen Coho, MD;  Location: [facility];  Service: Gynecology;  Laterality: Bilateral;   CHOLECYSTECTOMY  11/2007   Cholecystitis - Laparoscopic cholecystectomy with intraoperative  cholangiogram.   CYSTOSCOPY N/A 11/14/2017   Procedure: CYSTOSCOPY;  Surgeon: Carmen Coho, MD;  Location: [facility];  Service: Gynecology;  Laterality: N/A;   CYSTOSCOPY WITH RETROGRADE PYELOGRAM, URETEROSCOPY AND STENT PLACEMENT Right 06/25/2019   Procedure: CYSTOSCOPY WITH RETROGRADE PYELOGRAM, AND RIGHT  STENT PLACEMENT;  Surgeon: Carmen Christmas, MD;  Location: [facility];  Service: Urology;  Laterality: Right;   CYSTOSCOPY WITH RETROGRADE PYELOGRAM, URETEROSCOPY AND STENT PLACEMENT Right 07/07/2019   Procedure: CYSTOSCOPY , URETEROSCOPY AND STENT REPLACEMENT;  Surgeon: Carmen Christmas, MD;  Location: [facility];  Service: Urology;  Laterality: Right;   CYSTOSCOPY WITH RETROGRADE PYELOGRAM, URETEROSCOPY AND STENT PLACEMENT Right 03/23/2021   Procedure: CYSTOSCOPY WITH RETROGRADE PYELOGRAM, URETEROSCOPY AND STENT PLACEMENT;  Surgeon: Carmen Matar, MD;  Location: [facility];  Service: Urology;  Laterality: Right;   HOLMIUM LASER APPLICATION Right 07/07/2019   Procedure: HOLMIUM LASER APPLICATION;  Surgeon: Carmen Christmas, MD;  Location: [facility];  Service: Urology;  Laterality: Right;    HOLMIUM LASER APPLICATION Right 03/23/2021   Procedure: HOLMIUM LASER APPLICATION;  Surgeon: Carmen Matar, MD;  Location: [facility];  Service: Urology;  Laterality: Right;   PACEMAKER INSERTION  2016   Tilt Table Study  07/17/2005   Positive tilt table study with significant cardioinhibitory  response.   VAGINAL HYSTERECTOMY N/A 11/14/2017   Procedure: HYSTERECTOMY VAGINAL;  Surgeon: Carmen Lay, MD;  Location: [facility];  Service: Gynecology;  Laterality: N/A;   WISDOM TOOTH EXTRACTION     age 32   Social History   Socioeconomic History   Marital status: Married    Spouse name: Not on file   Number of children: Not on file   Years of education: Not on file   Highest education level: Not on file  Occupational History   Occupation: mammography tech  Tobacco Use   Smoking status: Never   Smokeless tobacco: Never  Vaping Use   Vaping Use: Never used  Substance and Sexual Activity   Alcohol use: Yes    Comment: very rare   Drug use: Never   Sexual activity: Not on file  Other Topics Concern   Not on file  Social History Narrative   Not on file   Social Determinants of Health   Financial Resource Strain: Not on file  Food Insecurity: Not on file  Transportation Needs: Not on file  Physical Activity: Not on file  Stress: Not on file  Social Connections: Not on file   Allergies  Allergen Reactions   Tape Hives    Adhesive tape - tegaderm/paper tape ok   Dilaudid [Hydromorphone Hcl] Itching    "I just about crawled out of my skin"   Prochlorperazine Other (See Comments)    Akathisia with compazine in ED on 05/17/19.  Resolved with repeat dose of benadryl.      Quinolones Nausea Only   Reglan [Metoclopramide] Other (See Comments)    flinging legs and "not feeling right".    Family History  Problem Relation Age of Onset   Hypertension Mother    Heart disease Mother    Cancer Mother        breast   Breast cancer Mother 49   Diabetes Mother     Hyperlipidemia Mother    Thyroid disease Mother    Depression Mother    Anxiety disorder Mother    Bipolar disorder Mother    Liver disease Mother    Alcoholism Mother    Obesity Mother    Heart attack Father    Hyperlipidemia Father    Hepatitis C Father    Diabetes Father    Hypertension Father    Heart disease Father    Sudden Cardiac Death Father    Depression Father    Anxiety disorder Father    Liver disease Father    Alcoholism Father    Drug abuse Father    Obesity Father    Cancer Maternal Aunt        Breast   Breast  cancer Maternal Aunt 47   Breast cancer Maternal Grandmother 23    Current Outpatient Medications (Endocrine & Metabolic):    levothyroxine (SYNTHROID) 100 MCG tablet, TAKE 1 TABLET BY MOUTH ONCE DAILY   levothyroxine (SYNTHROID) 100 MCG tablet, Take 1 tablet by mouth once daily   Semaglutide, 1 MG/DOSE, (OZEMPIC, 1 MG/DOSE,) 4 MG/3ML SOPN, Inject one mg into the skin once a week.   Semaglutide, 2 MG/DOSE, (OZEMPIC, 2 MG/DOSE,) 8 MG/3ML SOPN, Inject 2 mg into the skin once a week.   Semaglutide,0.25 or 0.5MG /DOS, (OZEMPIC, 0.25 OR 0.5 MG/DOSE,) 2 MG/1.5ML SOPN, Inject 0.5 mg into the skin once a week.  Current Outpatient Medications (Cardiovascular):    amLODipine (NORVASC) 5 MG tablet, Take 1 tablet (5 mg total) by mouth daily.   atorvastatin (LIPITOR) 80 MG tablet, Take one tablet (80 mg dose) by mouth daily. (Patient taking differently: 600 pm in evening)   fenofibrate (TRICOR) 145 MG tablet, Take one tablet (145 mg dose) by mouth daily. (Patient taking differently: Take by mouth at bedtime.)   metoprolol succinate (TOPROL-XL) 100 MG 24 hr tablet, Take one tablet (100 mg dose) by mouth daily.   metoprolol succinate (TOPROL-XL) 50 MG 24 hr tablet, Take 2 tablets (100 mg total) by mouth in the morning AND 1 tablet (50 mg total) every evening.   minoxidil (LONITEN) 2.5 MG tablet, Take 1/2 tablet by mouth daily.   telmisartan (MICARDIS) 20 MG tablet, Take  one tablet (20 mg dose) by mouth daily.  Current Outpatient Medications (Respiratory):    promethazine (PHENERGAN) 25 MG tablet, Take 25 mg by mouth every 6 (six) hours as needed for nausea or vomiting.  Current Outpatient Medications (Analgesics):    meloxicam (MOBIC) 15 MG tablet, Take 1 tablet (15 mg total) by mouth daily.   oxyCODONE-acetaminophen (PERCOCET/ROXICET) 5-325 MG tablet, Take 1 tablet by mouth every 6 (six) hours as needed for severe pain.   Rimegepant Sulfate (NURTEC) 75 MG TBDP, Dissolve one tablet by mouth as needed.   SUMAtriptan (IMITREX) 100 MG tablet, Take 1 tablet by mouth once daily as needed for migraine. May repeat ONCE in 2 hours.   Current Outpatient Medications (Other):    acyclovir (ZOVIRAX) 400 MG tablet, Take 1 tablet (400 mg total) by mouth daily.   cephALEXin (KEFLEX) 500 MG capsule, Take 1 capsule by mouth twice a day   clotrimazole-betamethasone (LOTRISONE) cream, Apply to the affected and surrounding areas of the skin 2 times daily in the morning and evening for 2 weeks (Patient taking differently: Apply topically as needed.)   diazepam (VALIUM) 5 MG tablet, Take 1 tablet (5 mg total) by mouth 3 (three) times daily.   DULoxetine (CYMBALTA) 20 MG capsule, Take 1 capsule (20 mg total) by mouth daily.   FLUoxetine (PROZAC) 10 MG capsule, Take one capsule (10 mg dose) by mouth daily.   ondansetron (ZOFRAN-ODT) 4 MG disintegrating tablet, Take 1 tablet (4 mg total) by mouth every 8 (eight) hours as needed for nausea or vomiting.   oxybutynin (DITROPAN) 5 MG tablet, Take 1 tablet (5 mg total) by mouth every 8 (eight) hours as needed for up to 15 doses for bladder spasms.   pantoprazole (PROTONIX) 40 MG tablet, Take 1 tablet (40 mg total) by mouth daily.   Probiotic Product (PROBIOTIC PO), Take 1 capsule by mouth daily.   protriptyline (VIVACTIL) 10 MG tablet, Take two tablets (20 mg dose) by mouth at bedtime.   QUEtiapine (SEROQUEL) 25 MG tablet, Take one  tablet (25 mg dose) by mouth at bedtime for 30 days.   Semaglutide-Weight Management (WEGOVY) 2.4 MG/0.75ML SOAJ, Inject 2.4 mg into the skin once a week.   solifenacin (VESICARE) 10 MG tablet, Take one tablet (10 mg dose) by mouth daily.   sulfamethoxazole-trimethoprim (BACTRIM DS) 800-160 MG tablet, Take 1 tablet by mouth 2 times daily   tamsulosin (FLOMAX) 0.4 MG CAPS capsule, Take 1 capsule (0.4 mg total) by mouth daily.   topiramate (TOPAMAX) 50 MG tablet, Take 50 mg by mouth 2 (two) times daily. Tapering off last dose 03-22-2021   triamcinolone cream (KENALOG) 0.5 %, Apply 1 application on to the affected area of the skin 2 times daily   zolpidem (AMBIEN) 10 MG tablet, Take one tablet (10 mg dose) by mouth at bedtime as needed for Sleep (Patient taking differently: at bedtime as needed.)   zolpidem (AMBIEN) 10 MG tablet, Take 1 tablet (10 mg total) by mouth at bedtime as needed sleep.   Reviewed prior external information including notes and imaging from  primary care provider As well as notes that were available from care everywhere and other healthcare systems.  Past medical history, social, surgical and family history all reviewed in electronic medical record.  No pertanent information unless stated regarding to the chief complaint.   Review of Systems:  No headache, visual changes, nausea, vomiting, diarrhea, constipation, dizziness, abdominal pain, skin rash, fevers, chills, night sweats, weight loss, swollen lymph nodes, body aches, joint swelling, chest pain, shortness of breath, mood changes. POSITIVE muscle aches  Objective  There were no vitals taken for this visit.   General: No apparent distress alert and oriented x3 mood and affect normal, dressed appropriately.  HEENT: Pupils equal, extraocular movements intact  Respiratory: Patient's speak in full sentences and does not appear short of breath  Cardiovascular: No lower extremity edema, non tender, no erythema       Impression and Recommendations:

## 2022-01-11 ENCOUNTER — Ambulatory Visit: Payer: 59 | Admitting: Family Medicine

## 2022-01-16 ENCOUNTER — Other Ambulatory Visit (HOSPITAL_BASED_OUTPATIENT_CLINIC_OR_DEPARTMENT_OTHER): Payer: Self-pay

## 2022-01-25 DIAGNOSIS — F419 Anxiety disorder, unspecified: Secondary | ICD-10-CM | POA: Diagnosis not present

## 2022-01-25 DIAGNOSIS — I10 Essential (primary) hypertension: Secondary | ICD-10-CM | POA: Diagnosis not present

## 2022-01-25 DIAGNOSIS — R072 Precordial pain: Secondary | ICD-10-CM | POA: Diagnosis not present

## 2022-01-25 DIAGNOSIS — Z45018 Encounter for adjustment and management of other part of cardiac pacemaker: Secondary | ICD-10-CM | POA: Diagnosis not present

## 2022-01-25 DIAGNOSIS — I6389 Other cerebral infarction: Secondary | ICD-10-CM | POA: Diagnosis not present

## 2022-01-25 DIAGNOSIS — G90A Postural orthostatic tachycardia syndrome (POTS): Secondary | ICD-10-CM | POA: Diagnosis not present

## 2022-01-25 DIAGNOSIS — R7982 Elevated C-reactive protein (CRP): Secondary | ICD-10-CM | POA: Diagnosis not present

## 2022-01-29 ENCOUNTER — Other Ambulatory Visit (HOSPITAL_BASED_OUTPATIENT_CLINIC_OR_DEPARTMENT_OTHER): Payer: Self-pay

## 2022-01-29 MED ORDER — SOLIFENACIN SUCCINATE 10 MG PO TABS
ORAL_TABLET | ORAL | 0 refills | Status: DC
  Filled 2022-01-29: qty 30, 30d supply, fill #0

## 2022-01-31 ENCOUNTER — Encounter (INDEPENDENT_AMBULATORY_CARE_PROVIDER_SITE_OTHER): Payer: Self-pay

## 2022-02-02 ENCOUNTER — Other Ambulatory Visit (HOSPITAL_BASED_OUTPATIENT_CLINIC_OR_DEPARTMENT_OTHER): Payer: Self-pay

## 2022-02-02 MED ORDER — NYSTATIN 100000 UNIT/GM EX POWD
CUTANEOUS | 0 refills | Status: DC
  Filled 2022-02-02: qty 60, 30d supply, fill #0

## 2022-02-02 MED ORDER — LEVOTHYROXINE SODIUM 100 MCG PO TABS
100.0000 ug | ORAL_TABLET | Freq: Every day | ORAL | 0 refills | Status: DC
  Filled 2022-02-02: qty 30, 30d supply, fill #0

## 2022-02-05 ENCOUNTER — Other Ambulatory Visit (HOSPITAL_BASED_OUTPATIENT_CLINIC_OR_DEPARTMENT_OTHER): Payer: Self-pay

## 2022-02-08 ENCOUNTER — Other Ambulatory Visit (HOSPITAL_BASED_OUTPATIENT_CLINIC_OR_DEPARTMENT_OTHER): Payer: Self-pay

## 2022-02-08 DIAGNOSIS — G4700 Insomnia, unspecified: Secondary | ICD-10-CM | POA: Diagnosis not present

## 2022-02-08 DIAGNOSIS — F419 Anxiety disorder, unspecified: Secondary | ICD-10-CM | POA: Diagnosis not present

## 2022-02-08 DIAGNOSIS — E669 Obesity, unspecified: Secondary | ICD-10-CM | POA: Diagnosis not present

## 2022-02-08 MED ORDER — BUSPIRONE HCL 10 MG PO TABS
ORAL_TABLET | ORAL | 2 refills | Status: DC
  Filled 2022-02-08: qty 60, 30d supply, fill #0
  Filled 2022-03-04: qty 60, 30d supply, fill #1
  Filled 2022-04-02: qty 60, 30d supply, fill #2

## 2022-02-08 MED ORDER — CLONAZEPAM 0.5 MG PO TABS
ORAL_TABLET | ORAL | 0 refills | Status: DC
  Filled 2022-02-08: qty 45, 23d supply, fill #0

## 2022-02-12 ENCOUNTER — Other Ambulatory Visit (HOSPITAL_BASED_OUTPATIENT_CLINIC_OR_DEPARTMENT_OTHER): Payer: Self-pay

## 2022-02-21 DIAGNOSIS — R457 State of emotional shock and stress, unspecified: Secondary | ICD-10-CM | POA: Diagnosis not present

## 2022-02-21 DIAGNOSIS — I10 Essential (primary) hypertension: Secondary | ICD-10-CM | POA: Diagnosis not present

## 2022-02-21 DIAGNOSIS — R55 Syncope and collapse: Secondary | ICD-10-CM | POA: Diagnosis not present

## 2022-03-04 ENCOUNTER — Other Ambulatory Visit (HOSPITAL_BASED_OUTPATIENT_CLINIC_OR_DEPARTMENT_OTHER): Payer: Self-pay

## 2022-03-05 ENCOUNTER — Other Ambulatory Visit (HOSPITAL_BASED_OUTPATIENT_CLINIC_OR_DEPARTMENT_OTHER): Payer: Self-pay

## 2022-03-05 MED ORDER — LEVOTHYROXINE SODIUM 100 MCG PO TABS
100.0000 ug | ORAL_TABLET | Freq: Every day | ORAL | 0 refills | Status: DC
  Filled 2022-03-05: qty 30, 30d supply, fill #0

## 2022-03-05 MED ORDER — QUETIAPINE FUMARATE 25 MG PO TABS
25.0000 mg | ORAL_TABLET | Freq: Every day | ORAL | 5 refills | Status: DC
  Filled 2022-03-05: qty 30, 30d supply, fill #0
  Filled 2022-04-02: qty 30, 30d supply, fill #1
  Filled 2022-04-26: qty 30, 30d supply, fill #2
  Filled 2022-05-30: qty 30, 30d supply, fill #3
  Filled 2022-07-09: qty 30, 30d supply, fill #4

## 2022-03-05 MED ORDER — SOLIFENACIN SUCCINATE 10 MG PO TABS
10.0000 mg | ORAL_TABLET | Freq: Every day | ORAL | 0 refills | Status: DC
  Filled 2022-03-05: qty 30, 30d supply, fill #0

## 2022-03-06 ENCOUNTER — Other Ambulatory Visit (HOSPITAL_BASED_OUTPATIENT_CLINIC_OR_DEPARTMENT_OTHER): Payer: Self-pay

## 2022-03-06 MED ORDER — CLONAZEPAM 0.5 MG PO TABS
0.5000 mg | ORAL_TABLET | Freq: Two times a day (BID) | ORAL | 0 refills | Status: DC
  Filled 2022-03-06: qty 45, 23d supply, fill #0

## 2022-03-13 ENCOUNTER — Other Ambulatory Visit (HOSPITAL_BASED_OUTPATIENT_CLINIC_OR_DEPARTMENT_OTHER): Payer: Self-pay

## 2022-03-13 DIAGNOSIS — R55 Syncope and collapse: Secondary | ICD-10-CM | POA: Diagnosis not present

## 2022-03-13 DIAGNOSIS — I10 Essential (primary) hypertension: Secondary | ICD-10-CM | POA: Diagnosis not present

## 2022-03-13 DIAGNOSIS — Z0000 Encounter for general adult medical examination without abnormal findings: Secondary | ICD-10-CM | POA: Diagnosis not present

## 2022-03-13 DIAGNOSIS — E785 Hyperlipidemia, unspecified: Secondary | ICD-10-CM | POA: Diagnosis not present

## 2022-03-13 DIAGNOSIS — G43009 Migraine without aura, not intractable, without status migrainosus: Secondary | ICD-10-CM | POA: Diagnosis not present

## 2022-03-13 DIAGNOSIS — E039 Hypothyroidism, unspecified: Secondary | ICD-10-CM | POA: Diagnosis not present

## 2022-03-13 MED ORDER — HYDROCHLOROTHIAZIDE 25 MG PO TABS
25.0000 mg | ORAL_TABLET | Freq: Every morning | ORAL | 5 refills | Status: DC
  Filled 2022-03-13: qty 30, 30d supply, fill #0
  Filled 2022-04-26: qty 30, 30d supply, fill #1
  Filled 2022-05-30: qty 30, 30d supply, fill #2
  Filled 2022-07-09: qty 30, 30d supply, fill #3
  Filled 2022-08-29: qty 30, 30d supply, fill #4
  Filled 2022-10-03: qty 30, 30d supply, fill #5

## 2022-03-19 ENCOUNTER — Other Ambulatory Visit (HOSPITAL_BASED_OUTPATIENT_CLINIC_OR_DEPARTMENT_OTHER): Payer: Self-pay

## 2022-03-29 NOTE — Progress Notes (Signed)
Carmen [name] D.[name] [address] [place] [address] Phone: [phone]   Assessment and Plan:    1. Strain of rhomboid muscle, initial encounter 2. Strain of left levator scapulae muscle, initial encounter  -Chronic with exacerbation, initial sports medicine visit - Likely strains of left levator, left superior rhomboid, left thoracic portion of trapezius caused by patient's recent physical activity moving as well as physically active job performing mammograms - Start meloxicam 15 mg daily x2 weeks.  If still having pain after 2 weeks, complete 3rd-week of meloxicam. May use remaining meloxicam as needed once daily for pain control.  Do not to use additional NSAIDs while taking meloxicam.  May use Tylenol [phone] mg 2 to 3 times a day for breakthrough pain. - Start HEP for neck and scapular - Patient elected for trigger point injections.  Tolerated well per note below.  Trigger Point Injection: After informed consent was obtained, skin cleaned with alcohol  prep.  A total of 4 trigger points identified along left-sided rhomboid, trapezius, levator.  Injections given over area of pain for total injection of 3 ml lidocaine 1% w/o epi.  Patient had relief after the injection without side effects.  Pt given signs of infection to watch for.   Pertinent previous records reviewed include none   Follow Up: As needed if no improvement or worsening symptoms in 2 to 3 weeks.  Could consider trigger point injections with corticosteroid versus OMT   Subjective:   I, Carmen [name], am serving as a Neurosurgeon for Carmen [name]  Chief Complaint: back pain   HPI:  11/30/2021 Carmen [name] is a 41 y.o. female coming in with complaint of back and neck pain. OMT on 08/23/2021. Also seen for shoulder pain. Patient states thinks shingles in a new place. Shoulder pain has improved. Has to pop to feel better. No other complaints.   Medications patient  has been prescribed: None   Taking:   Left shoulder xray mild spurring subacromial area  Left AC joint was injected January 18   03/30/22 Patient states having a lot of shoulder pain , moved a couple of weeks ago and it exacerbated the pain, pain more in her trap , cant do much , pain is more sharpe and hot poker feeling, TTP , constant pain   Relevant Historical Information:  None pertinent  Additional pertinent review of systems negative.   Current Outpatient Medications:    acyclovir (ZOVIRAX) 400 MG tablet, Take 1 tablet (400 mg total) by mouth daily., Disp: 90 tablet, Rfl: 1   amLODipine (NORVASC) 5 MG tablet, Take 1 tablet (5 mg total) by mouth daily., Disp: 30 tablet, Rfl: 5   atorvastatin (LIPITOR) 80 MG tablet, Take one tablet (80 mg dose) by mouth daily. (Patient taking differently: 600 pm in evening), Disp: 90 tablet, Rfl: 1   busPIRone (BUSPAR) 10 MG tablet, Take one tablet (10 mg dose) by mouth 2 (two) times daily., Disp: 60 tablet, Rfl: 2   cephALEXin (KEFLEX) 500 MG capsule, Take 1 capsule by mouth twice a day, Disp: 10 capsule, Rfl: 0   clonazePAM (KLONOPIN) 0.5 MG tablet, Take 1 tablet (0.5 mg total) by mouth 2 (two) times daily. Max Daily Amount: 1 mg., Disp: 45 tablet, Rfl: 0   clotrimazole-betamethasone (LOTRISONE) cream, Apply to the affected and surrounding areas of the skin 2 times daily in the morning and evening for 2 weeks (Patient taking differently: Apply topically as needed.), Disp: 15  g, Rfl: 6   diazepam (VALIUM) 5 MG tablet, Take 1 tablet (5 mg total) by mouth 3 (three) times daily., Disp: 70 tablet, Rfl: 1   DULoxetine (CYMBALTA) 20 MG capsule, Take 1 capsule (20 mg total) by mouth daily., Disp: 30 capsule, Rfl: 0   fenofibrate (TRICOR) 145 MG tablet, Take one tablet (145 mg dose) by mouth daily. (Patient taking differently: Take by mouth at bedtime.), Disp: 90 tablet, Rfl: 1   FLUoxetine (PROZAC) 10 MG capsule, Take one capsule (10 mg dose) by mouth daily.,  Disp: 30 capsule, Rfl: 2   hydrochlorothiazide (HYDRODIURIL) 25 MG tablet, Take 1 tablet (25 mg total) by mouth in the morning., Disp: 30 tablet, Rfl: 5   levothyroxine (SYNTHROID) 100 MCG tablet, TAKE 1 TABLET BY MOUTH ONCE DAILY, Disp: 30 tablet, Rfl: 2   levothyroxine (SYNTHROID) 100 MCG tablet, Take 1 tablet by mouth once daily, Disp: 30 tablet, Rfl: 0   meloxicam (MOBIC) 15 MG tablet, Take 1 tablet (15 mg total) by mouth daily., Disp: 30 tablet, Rfl: 0   metoprolol succinate (TOPROL-XL) 100 MG 24 hr tablet, Take one tablet (100 mg dose) by mouth daily., Disp: 90 tablet, Rfl: 0   metoprolol succinate (TOPROL-XL) 50 MG 24 hr tablet, Take 2 tablets (100 mg total) by mouth in the morning AND 1 tablet (50 mg total) every evening., Disp: 135 tablet, Rfl: 3   minoxidil (LONITEN) 2.5 MG tablet, Take 1/2 tablet by mouth daily., Disp: 45 tablet, Rfl: 1   nystatin (MYCOSTATIN/NYSTOP) powder, Apply to affected area 3 times daily, Disp: 60 g, Rfl: 0   ondansetron (ZOFRAN-ODT) 4 MG disintegrating tablet, Take 1 tablet (4 mg total) by mouth every 8 (eight) hours as needed for nausea or vomiting., Disp: 20 tablet, Rfl: 0   oxybutynin (DITROPAN) 5 MG tablet, Take 1 tablet (5 mg total) by mouth every 8 (eight) hours as needed for up to 15 doses for bladder spasms., Disp: 15 tablet, Rfl: 1   oxyCODONE-acetaminophen (PERCOCET/ROXICET) 5-325 MG tablet, Take 1 tablet by mouth every 6 (six) hours as needed for severe pain., Disp: 15 tablet, Rfl: 0   pantoprazole (PROTONIX) 40 MG tablet, Take 1 tablet (40 mg total) by mouth daily., Disp: 30 tablet, Rfl: 3   Probiotic Product (PROBIOTIC PO), Take 1 capsule by mouth daily., Disp: , Rfl:    promethazine (PHENERGAN) 25 MG tablet, Take 25 mg by mouth every 6 (six) hours as needed for nausea or vomiting., Disp: , Rfl:    protriptyline (VIVACTIL) 10 MG tablet, Take two tablets (20 mg dose) by mouth at bedtime., Disp: 180 tablet, Rfl: 1   QUEtiapine (SEROQUEL) 25 MG tablet,  Take 1 tablet (25 mg total) by mouth at bedtime., Disp: 30 tablet, Rfl: 5   Rimegepant Sulfate (NURTEC) 75 MG TBDP, Dissolve one tablet by mouth as needed., Disp: 8 tablet, Rfl: 3   Semaglutide, 1 MG/DOSE, (OZEMPIC, 1 MG/DOSE,) 4 MG/3ML SOPN, Inject one mg into the skin once a week., Disp: 3 mL, Rfl: 5   Semaglutide, 2 MG/DOSE, (OZEMPIC, 2 MG/DOSE,) 8 MG/3ML SOPN, Inject 2 mg into the skin once a week., Disp: 3 mL, Rfl: 5   Semaglutide,0.25 or 0.5MG /DOS, (OZEMPIC, 0.25 OR 0.5 MG/DOSE,) 2 MG/1.5ML SOPN, Inject 0.5 mg into the skin once a week., Disp: 1.5 mL, Rfl: 2   Semaglutide-Weight Management (WEGOVY) 2.4 MG/0.75ML SOAJ, Inject 2.4 mg into the skin once a week., Disp: 3 mL, Rfl: 11   solifenacin (VESICARE) 10 MG tablet, Take  one tablet (10 mg dose) by mouth daily., Disp: 30 tablet, Rfl: 2   solifenacin (VESICARE) 10 MG tablet, Take 1 tablet (10 mg total) by mouth daily., Disp: 30 tablet, Rfl: 0   sulfamethoxazole-trimethoprim (BACTRIM DS) 800-160 MG tablet, Take 1 tablet by mouth 2 times daily, Disp: 14 tablet, Rfl: 0   SUMAtriptan (IMITREX) 100 MG tablet, Take 1 tablet by mouth once daily as needed for migraine. May repeat ONCE in 2 hours., Disp: 9 tablet, Rfl: 2   tamsulosin (FLOMAX) 0.4 MG CAPS capsule, Take 1 capsule (0.4 mg total) by mouth daily., Disp: 30 capsule, Rfl: 0   telmisartan (MICARDIS) 20 MG tablet, Take one tablet (20 mg dose) by mouth daily., Disp: 30 tablet, Rfl: 2   topiramate (TOPAMAX) 50 MG tablet, Take 50 mg by mouth 2 (two) times daily. Tapering off last dose 03-22-2021, Disp: , Rfl:    triamcinolone cream (KENALOG) 0.5 %, Apply 1 application on to the affected area of the skin 2 times daily, Disp: 45 g, Rfl: 3   zolpidem (AMBIEN) 10 MG tablet, Take one tablet (10 mg dose) by mouth at bedtime as needed for Sleep (Patient taking differently: at bedtime as needed.), Disp: 30 tablet, Rfl: 2   zolpidem (AMBIEN) 10 MG tablet, Take 1 tablet (10 mg total) by mouth at bedtime as  needed sleep., Disp: 30 tablet, Rfl: 2   Objective:     Vitals:   03/30/22 0759  BP: 122/80  Pulse: 94  SpO2: 100%  Weight: 175 lb (79.4 kg)  Height: 5\' 2"  (1.575 m)      Body mass index is 32.01 kg/m.    Physical Exam:    Gen: Appears well, nad, nontoxic and pleasant Psych: Alert and oriented, appropriate mood and affect Neuro: sensation intact, strength is 5/5 in upper and lower extremities, muscle tone wnl Skin: no susupicious lesions or rashes  Back - Normal skin, Spine with normal alignment and no deformity.   No tenderness to vertebral process palpation.   Paraspinous muscles are not tender and without spasm TTP superior left rhomboid, left levator, proximal thoracic trapezius Equivocal scapular motion   Electronically signed by:  D.[name] 8:16 AM 03/30/22

## 2022-03-30 ENCOUNTER — Ambulatory Visit (INDEPENDENT_AMBULATORY_CARE_PROVIDER_SITE_OTHER): Payer: 59 | Admitting: Sports Medicine

## 2022-03-30 ENCOUNTER — Other Ambulatory Visit (HOSPITAL_BASED_OUTPATIENT_CLINIC_OR_DEPARTMENT_OTHER): Payer: Self-pay

## 2022-03-30 ENCOUNTER — Encounter: Payer: Self-pay | Admitting: Sports Medicine

## 2022-03-30 VITALS — BP 122/80 | HR 94 | Ht 62.0 in | Wt 175.0 lb

## 2022-03-30 DIAGNOSIS — S46812A Strain of other muscles, fascia and tendons at shoulder and upper arm level, left arm, initial encounter: Secondary | ICD-10-CM

## 2022-03-30 DIAGNOSIS — S29012A Strain of muscle and tendon of back wall of thorax, initial encounter: Secondary | ICD-10-CM

## 2022-03-30 MED ORDER — MELOXICAM 15 MG PO TABS
15.0000 mg | ORAL_TABLET | Freq: Every day | ORAL | 0 refills | Status: DC
  Filled 2022-03-30: qty 30, 30d supply, fill #0

## 2022-03-30 NOTE — Patient Instructions (Addendum)
Good to see you  Neck trap and shoulder HEP - Start meloxicam 15 mg daily x2 weeks.  If still having pain after 2 weeks, complete 3rd-week of meloxicam. May use remaining meloxicam as needed once daily for pain control.  Do not to use additional NSAIDs while taking meloxicam.  May use Tylenol [phone] mg 2 to 3 times a day for breakthrough pain. As need follow up if no improvement 3 week follow up

## 2022-04-02 ENCOUNTER — Other Ambulatory Visit (HOSPITAL_BASED_OUTPATIENT_CLINIC_OR_DEPARTMENT_OTHER): Payer: Self-pay

## 2022-04-02 MED ORDER — CYCLOBENZAPRINE HCL 5 MG PO TABS
5.0000 mg | ORAL_TABLET | Freq: Every evening | ORAL | 0 refills | Status: DC | PRN
  Filled 2022-04-02: qty 30, 30d supply, fill #0

## 2022-04-02 MED ORDER — METOPROLOL SUCCINATE ER 100 MG PO TB24
100.0000 mg | ORAL_TABLET | Freq: Every day | ORAL | 0 refills | Status: DC
  Filled 2022-04-02: qty 90, 90d supply, fill #0

## 2022-04-02 MED ORDER — LEVOTHYROXINE SODIUM 100 MCG PO TABS
100.0000 ug | ORAL_TABLET | Freq: Every day | ORAL | 0 refills | Status: DC
  Filled 2022-04-26: qty 90, 90d supply, fill #0

## 2022-04-03 ENCOUNTER — Other Ambulatory Visit (HOSPITAL_BASED_OUTPATIENT_CLINIC_OR_DEPARTMENT_OTHER): Payer: Self-pay

## 2022-04-03 MED ORDER — CLONAZEPAM 0.5 MG PO TABS
0.5000 mg | ORAL_TABLET | Freq: Two times a day (BID) | ORAL | 1 refills | Status: DC
  Filled 2022-04-03: qty 45, 23d supply, fill #0
  Filled 2022-04-26: qty 45, 23d supply, fill #1

## 2022-04-10 NOTE — Progress Notes (Unsigned)
[place] Phone: [phone] Subjective:   Carmen [name], am serving as a scribe for Dr. Hulan Saas.  I'm seeing this patient by the request  of:  Carmen Sewer, MD  CC: Significant neck pain and spasm  DUK:[id number]  Carmen [name] is a 41 y.o. female coming in with complaint of back pain. Saw Dr. Glennon [name] recently for T spine pain. Patient states that pain in L trap has increased presently due to work. Had trigger point injection and massage. Neither really helped. Patient describes pain at burning. Denies any radiating symptoms.   Patient also notes more noise in her knees recently. [name] pain but wants to know what to do to protect this joint as her mom had both knees replaced.       Past Medical History:  Diagnosis Date   Back pain    Costochondritis 09/13/2010   GERD (gastroesophageal reflux disease)    Heart palpitations    long history of    Heartburn    History of chest pain    History of fatigue    History of kidney stones    History of migraine headaches    History of tilt table evaluation    IMPRESSION: Positive tilt table study with significant cardioinhibitory  response.   Hyperlipidemia    Hypertension    Hypothyroidism    Kidney stones    history of   migraine    Neurocardiogenic syncope    Palpitations    Pericarditis 09/13/2010   Presence of permanent cardiac pacemaker    PSVT (paroxysmal supraventricular tachycardia)    Right ureteral calculus    Sleep apnea    mild osa [name] cpap needed   Sleeping difficulties    Stress    Stroke ([place]) 06/15/2019   [name] residual deficits, tpa given at time of stroke   Stroke [facility])    tia jan 2022   SVD (spontaneous vaginal delivery)    x 1   Syncope and collapse    neurocardiogenic syncopy started age 7   Wears glasses    Past Surgical History:  Procedure Laterality Date   ANTERIOR AND POSTERIOR REPAIR N/A 11/14/2017   Procedure:  ANTERIOR (CYSTOCELE) AND POSTERIOR REPAIR (RECTOCELE);  Surgeon: Delsa Bern, MD;  Location: [facility];  Service: Gynecology;  Laterality: N/A;   BILATERAL SALPINGECTOMY Bilateral 11/14/2017   Procedure: BILATERAL SALPINGECTOMY;  Surgeon: Delsa Bern, MD;  Location: [facility];  Service: Gynecology;  Laterality: Bilateral;   BLADDER SUSPENSION Bilateral 11/14/2017   Procedure: TRANSVAGINAL TAPE (TVT) PROCEDURE;  Surgeon: Everett Graff, MD;  Location: [facility];  Service: Gynecology;  Laterality: Bilateral;   CHOLECYSTECTOMY  11/2007   Cholecystitis - Laparoscopic cholecystectomy with intraoperative  cholangiogram.   CYSTOSCOPY N/A 11/14/2017   Procedure: CYSTOSCOPY;  Surgeon: Everett Graff, MD;  Location: [facility];  Service: Gynecology;  Laterality: N/A;   CYSTOSCOPY WITH RETROGRADE PYELOGRAM, URETEROSCOPY AND STENT PLACEMENT Right 06/25/2019   Procedure: CYSTOSCOPY WITH RETROGRADE PYELOGRAM, AND RIGHT  STENT PLACEMENT;  Surgeon: Robley Fries, MD;  Location: [facility];  Service: Urology;  Laterality: Right;   CYSTOSCOPY WITH RETROGRADE PYELOGRAM, URETEROSCOPY AND STENT PLACEMENT Right 07/07/2019   Procedure: CYSTOSCOPY , URETEROSCOPY AND STENT REPLACEMENT;  Surgeon: Robley Fries, MD;  Location: [facility];  Service: Urology;  Laterality: Right;   CYSTOSCOPY WITH RETROGRADE PYELOGRAM, URETEROSCOPY AND STENT PLACEMENT Right 03/23/2021   Procedure: CYSTOSCOPY WITH RETROGRADE PYELOGRAM, URETEROSCOPY AND STENT  PLACEMENT;  Surgeon: Marcine Matar, MD;  [facility];  Service: Urology;  Laterality: Right;   HOLMIUM LASER APPLICATION Right 07/07/2019   Procedure: HOLMIUM LASER APPLICATION;  Surgeon: Noel Christmas, MD;  Location: [facility];  Service: Urology;  Laterality: Right;   HOLMIUM LASER APPLICATION Right 03/23/2021   Procedure: HOLMIUM LASER APPLICATION;  Surgeon: Marcine Matar, MD;  Location: [facility];   Service: Urology;  Laterality: Right;   PACEMAKER INSERTION  2016   Tilt Table Study  07/17/2005   Positive tilt table study with significant cardioinhibitory  response.   VAGINAL HYSTERECTOMY N/A 11/14/2017   Procedure: HYSTERECTOMY VAGINAL;  Surgeon: Silverio Lay, MD;  Location: [facility];  Service: Gynecology;  Laterality: N/A;   WISDOM TOOTH EXTRACTION     age 1   Social History   Socioeconomic History   Marital status: Married    Spouse name: Not on file   Number of children: Not on file   Years of education: Not on file   Highest education level: Not on file  Occupational History   Occupation: mammography tech  Tobacco Use   Smoking status: Never   Smokeless tobacco: Never  Vaping Use   Vaping Use: Never used  Substance and Sexual Activity   Alcohol use: Yes    Comment: very rare   Drug use: Never   Sexual activity: Not on file  Other Topics Concern   Not on file  Social History Narrative   Not on file   Social Determinants of Health   Financial Resource Strain: Not on file  Food Insecurity: Not on file  Transportation Needs: Not on file  Physical Activity: Not on file  Stress: Not on file  Social Connections: Not on file   Allergies  Allergen Reactions   Tape Hives    Adhesive tape - tegaderm/paper tape ok   Dilaudid [Hydromorphone Hcl] Itching    "I just about crawled out of my skin"   Prochlorperazine Other (See Comments)    Akathisia with compazine in ED on 05/17/19.  Resolved with repeat dose of benadryl.      Quinolones Nausea Only   Reglan [Metoclopramide] Other (See Comments)    flinging legs and "not feeling right".    Family History  Problem Relation Age of Onset   Hypertension Mother    Heart disease Mother    Cancer Mother        breast   Breast cancer Mother 45   Diabetes Mother    Hyperlipidemia Mother    Thyroid disease Mother    Depression Mother    Anxiety disorder Mother    Bipolar disorder Mother    Liver disease Mother     Alcoholism Mother    Obesity Mother    Heart attack Father    Hyperlipidemia Father    Hepatitis C Father    Diabetes Father    Hypertension Father    Heart disease Father    Sudden Cardiac Death Father    Depression Father    Anxiety disorder Father    Liver disease Father    Alcoholism Father    Drug abuse Father    Obesity Father    Cancer Maternal Aunt        Breast   Breast cancer Maternal Aunt 78   Breast cancer Maternal Grandmother 45    Current Outpatient Medications (Endocrine & Metabolic):    levothyroxine (SYNTHROID) 100 MCG tablet, Take 1 tablet by mouth  once daily   predniSONE (DELTASONE) 20 MG tablet, Take 2 tablets (40 mg total) by mouth daily with breakfast.   Semaglutide, 1 MG/DOSE, (OZEMPIC, 1 MG/DOSE,) 4 MG/3ML SOPN, Inject one mg into the skin once a week.   Semaglutide, 2 MG/DOSE, (OZEMPIC, 2 MG/DOSE,) 8 MG/3ML SOPN, Inject 2 mg into the skin once a week.   Semaglutide,0.25 or 0.5MG /DOS, (OZEMPIC, 0.25 OR 0.5 MG/DOSE,) 2 MG/1.5ML SOPN, Inject 0.5 mg into the skin once a week.   levothyroxine (SYNTHROID) 100 MCG tablet, TAKE 1 TABLET BY MOUTH ONCE DAILY  Current Outpatient Medications (Cardiovascular):    amLODipine (NORVASC) 5 MG tablet, Take 1 tablet (5 mg total) by mouth daily.   atorvastatin (LIPITOR) 80 MG tablet, Take one tablet (80 mg dose) by mouth daily. (Patient taking differently: 600 pm in evening)   fenofibrate (TRICOR) 145 MG tablet, Take one tablet (145 mg dose) by mouth daily. (Patient taking differently: Take by mouth at bedtime.)   hydrochlorothiazide (HYDRODIURIL) 25 MG tablet, Take 1 tablet (25 mg total) by mouth in the morning.   metoprolol succinate (TOPROL-XL) 100 MG 24 hr tablet, Take one tablet (100 mg dose) by mouth daily.   metoprolol succinate (TOPROL-XL) 50 MG 24 hr tablet, Take 2 tablets (100 mg total) by mouth in the morning AND 1 tablet (50 mg total) every evening.   minoxidil (LONITEN) 2.5 MG tablet, Take 1/2 tablet by mouth  daily.   telmisartan (MICARDIS) 20 MG tablet, Take one tablet (20 mg dose) by mouth daily.  Current Outpatient Medications (Respiratory):    promethazine (PHENERGAN) 25 MG tablet, Take 25 mg by mouth every 6 (six) hours as needed for nausea or vomiting.  Current Outpatient Medications (Analgesics):    meloxicam (MOBIC) 15 MG tablet, Take 1 tablet (15 mg total) by mouth daily.   oxyCODONE-acetaminophen (PERCOCET/ROXICET) 5-325 MG tablet, Take 1 tablet by mouth every 6 (six) hours as needed for severe pain.   Rimegepant Sulfate (NURTEC) 75 MG TBDP, Dissolve one tablet by mouth as needed.   SUMAtriptan (IMITREX) 100 MG tablet, Take 1 tablet by mouth once daily as needed for migraine. May repeat ONCE in 2 hours.   Current Outpatient Medications (Other):    acyclovir (ZOVIRAX) 400 MG tablet, Take 1 tablet (400 mg total) by mouth daily.   busPIRone (BUSPAR) 10 MG tablet, Take one tablet (10 mg dose) by mouth 2 (two) times daily.   cephALEXin (KEFLEX) 500 MG capsule, Take 1 capsule by mouth twice a day   clonazePAM (KLONOPIN) 0.5 MG tablet, Take 1 tablet (0.5 mg total) by mouth 2 (two) times daily. Max Daily Amount: 1 mg.   clotrimazole-betamethasone (LOTRISONE) cream, Apply to the affected and surrounding areas of the skin 2 times daily in the morning and evening for 2 weeks (Patient taking differently: Apply topically as needed.)   cyclobenzaprine (FLEXERIL) 5 MG tablet, Take 1 tablet (5 mg total) by mouth at bedtime as needed for muscle spasms.   diazepam (VALIUM) 5 MG tablet, Take 1 tablet (5 mg total) by mouth 3 (three) times daily.   DULoxetine (CYMBALTA) 20 MG capsule, Take 1 capsule (20 mg total) by mouth daily.   FLUoxetine (PROZAC) 10 MG capsule, Take one capsule (10 mg dose) by mouth daily.   gabapentin (NEURONTIN) 100 MG capsule, Take 2 capsules (200 mg total) by mouth at bedtime.   nystatin (MYCOSTATIN/NYSTOP) powder, Apply to affected area 3 times daily   ondansetron (ZOFRAN-ODT) 4 MG  disintegrating tablet, Take 1 tablet (4  mg total) by mouth every 8 (eight) hours as needed for nausea or vomiting.   oxybutynin (DITROPAN) 5 MG tablet, Take 1 tablet (5 mg total) by mouth every 8 (eight) hours as needed for up to 15 doses for bladder spasms.   pantoprazole (PROTONIX) 40 MG tablet, Take 1 tablet (40 mg total) by mouth daily.   Probiotic Product (PROBIOTIC PO), Take 1 capsule by mouth daily.   protriptyline (VIVACTIL) 10 MG tablet, Take two tablets (20 mg dose) by mouth at bedtime.   QUEtiapine (SEROQUEL) 25 MG tablet, Take 1 tablet (25 mg total) by mouth at bedtime.   Semaglutide-Weight Management (WEGOVY) 2.4 MG/0.75ML SOAJ, Inject 2.4 mg into the skin once a week.   solifenacin (VESICARE) 10 MG tablet, Take one tablet (10 mg dose) by mouth daily.   solifenacin (VESICARE) 10 MG tablet, Take 1 tablet (10 mg total) by mouth daily.   tamsulosin (FLOMAX) 0.4 MG CAPS capsule, Take 1 capsule (0.4 mg total) by mouth daily.   topiramate (TOPAMAX) 50 MG tablet, Take 50 mg by mouth 2 (two) times daily. Tapering off last dose 03-22-2021   triamcinolone cream (KENALOG) 0.5 %, Apply 1 application on to the affected area of the skin 2 times daily   zolpidem (AMBIEN) 10 MG tablet, Take one tablet (10 mg dose) by mouth at bedtime as needed for Sleep (Patient taking differently: at bedtime as needed.)   zolpidem (AMBIEN) 10 MG tablet, Take 1 tablet (10 mg total) by mouth at bedtime as needed sleep.   sulfamethoxazole-trimethoprim (BACTRIM DS) 800-160 MG tablet, Take 1 tablet by mouth 2 times daily   Reviewed prior external information including notes and imaging from  primary care provider As well as notes that were available from care everywhere and other healthcare systems.  Past medical history, social, surgical and family history all reviewed in electronic medical record.  [name] pertanent information unless stated regarding to the chief complaint.   Review of Systems:  [name] headache, visual  changes, nausea, vomiting, diarrhea, constipation, dizziness, abdominal pain, skin rash, fevers, chills, night sweats, weight loss, swollen lymph nodes, body aches, joint swelling, chest pain, shortness of breath, mood changes. POSITIVE muscle aches  Objective  Blood pressure 112/76, pulse (!) 102, height 5\' 2"  (1.575 m), weight 178 lb (80.7 kg), SpO2 98 %.   General: [name] apparent distress alert and oriented x3 mood and affect normal, dressed appropriately.  HEENT: Pupils equal, extraocular movements intact  Respiratory: Patient's speak in full sentences and does not appear short of breath  Cardiovascular: [name] lower extremity edema, non tender, [name] erythema  Significant tightness on the left side of the neck.  Patient does have some limited range of motion noted.  Does have some voluntary guarding noted.  Multiple trigger points noted in the trapezius, rhomboid and latissimus dorsi.  Negative Spurling's.  Patient does have guarded movement    Impression and Recommendations:     The above documentation has been reviewed and is accurate and complete , DO

## 2022-04-12 ENCOUNTER — Encounter: Payer: Self-pay | Admitting: Family Medicine

## 2022-04-12 ENCOUNTER — Ambulatory Visit (INDEPENDENT_AMBULATORY_CARE_PROVIDER_SITE_OTHER): Payer: 59 | Admitting: Family Medicine

## 2022-04-12 ENCOUNTER — Other Ambulatory Visit (HOSPITAL_BASED_OUTPATIENT_CLINIC_OR_DEPARTMENT_OTHER): Payer: Self-pay

## 2022-04-12 VITALS — BP 112/76 | HR 102 | Ht 62.0 in | Wt 178.0 lb

## 2022-04-12 DIAGNOSIS — M2550 Pain in unspecified joint: Secondary | ICD-10-CM | POA: Diagnosis not present

## 2022-04-12 DIAGNOSIS — M62838 Other muscle spasm: Secondary | ICD-10-CM

## 2022-04-12 LAB — FERRITIN: Ferritin: 40.7 ng/mL (ref 10.0–291.0)

## 2022-04-12 LAB — IBC PANEL
Iron: 70 ug/dL (ref 42–145)
Saturation Ratios: 21.8 % (ref 20.0–50.0)
TIBC: 320.6 ug/dL (ref 250.0–450.0)
Transferrin: 229 mg/dL (ref 212.0–360.0)

## 2022-04-12 LAB — VITAMIN B12: Vitamin B-12: 478 pg/mL (ref 211–911)

## 2022-04-12 LAB — VITAMIN D 25 HYDROXY (VIT D DEFICIENCY, FRACTURES): VITD: 29.45 ng/mL — ABNORMAL LOW (ref 30.00–100.00)

## 2022-04-12 LAB — SEDIMENTATION RATE: Sed Rate: 18 mm/hr (ref 0–20)

## 2022-04-12 LAB — C-REACTIVE PROTEIN: CRP: <1 mg/dL (ref 0.5–20.0)

## 2022-04-12 MED ORDER — PREDNISONE 20 MG PO TABS
40.0000 mg | ORAL_TABLET | Freq: Every day | ORAL | 0 refills | Status: DC
  Filled 2022-04-12: qty 10, 5d supply, fill #0

## 2022-04-12 MED ORDER — KETOROLAC TROMETHAMINE 60 MG/2ML IM SOLN
60.0000 mg | Freq: Once | INTRAMUSCULAR | Status: AC
  Administered 2022-04-12: 60 mg via INTRAMUSCULAR

## 2022-04-12 MED ORDER — METHYLPREDNISOLONE ACETATE 80 MG/ML IJ SUSP
80.0000 mg | Freq: Once | INTRAMUSCULAR | Status: AC
  Administered 2022-04-12: 80 mg via INTRAMUSCULAR

## 2022-04-12 MED ORDER — GABAPENTIN 100 MG PO CAPS
200.0000 mg | ORAL_CAPSULE | Freq: Every day | ORAL | 0 refills | Status: DC
  Filled 2022-04-12: qty 180, 90d supply, fill #0

## 2022-04-12 NOTE — Assessment & Plan Note (Signed)
Patient is  This could be more close lateral aspect discussed with patient about different treatment options and given Toradol and Depo-Medrol injection.  We discussed with patient about prednisone for 5 days. Gabapentin given for nighttime relief.  Concerned that this is more of a follow-up with me again in 6-8 weeks

## 2022-04-12 NOTE — Patient Instructions (Addendum)
Injections in backside today Prednisone 40 for 5 days Gabapentin 200mg  at night See me again in 6-8 weeks

## 2022-04-13 ENCOUNTER — Other Ambulatory Visit (HOSPITAL_BASED_OUTPATIENT_CLINIC_OR_DEPARTMENT_OTHER): Payer: Self-pay

## 2022-04-13 ENCOUNTER — Other Ambulatory Visit: Payer: Self-pay

## 2022-04-13 MED ORDER — VITAMIN D (ERGOCALCIFEROL) 1.25 MG (50000 UNIT) PO CAPS
50000.0000 [IU] | ORAL_CAPSULE | ORAL | 0 refills | Status: DC
  Filled 2022-04-13: qty 12, 84d supply, fill #0

## 2022-04-14 LAB — RHEUMATOID FACTOR: Rheumatoid fact SerPl-aCnc: <14 IU/mL (ref <14)

## 2022-04-14 LAB — ANTI-NUCLEAR AB-TITER (ANA TITER)
ANA TITER: 1:320 {titer} — ABNORMAL HIGH
ANA Titer 1: 1:80 {titer} — ABNORMAL HIGH

## 2022-04-14 LAB — PTH, INTACT AND CALCIUM
Calcium: 9.9 mg/dL (ref 8.6–10.2)
PTH: 23 pg/mL (ref 16–77)

## 2022-04-14 LAB — CYCLIC CITRUL PEPTIDE ANTIBODY, IGG: Cyclic Citrullin Peptide Ab: <16 UNITS

## 2022-04-14 LAB — ANA: Anti Nuclear Antibody (ANA): POSITIVE — AB

## 2022-04-15 ENCOUNTER — Encounter: Payer: Self-pay | Admitting: Family Medicine

## 2022-04-16 ENCOUNTER — Other Ambulatory Visit: Payer: Self-pay

## 2022-04-16 DIAGNOSIS — R768 Other specified abnormal immunological findings in serum: Secondary | ICD-10-CM

## 2022-04-17 DIAGNOSIS — R768 Other specified abnormal immunological findings in serum: Secondary | ICD-10-CM | POA: Diagnosis not present

## 2022-04-18 ENCOUNTER — Other Ambulatory Visit (HOSPITAL_BASED_OUTPATIENT_CLINIC_OR_DEPARTMENT_OTHER): Payer: Self-pay

## 2022-04-23 NOTE — Telephone Encounter (Signed)
Faxed

## 2022-04-26 ENCOUNTER — Other Ambulatory Visit (HOSPITAL_BASED_OUTPATIENT_CLINIC_OR_DEPARTMENT_OTHER): Payer: Self-pay

## 2022-04-27 ENCOUNTER — Other Ambulatory Visit (HOSPITAL_BASED_OUTPATIENT_CLINIC_OR_DEPARTMENT_OTHER): Payer: Self-pay

## 2022-04-27 MED ORDER — SOLIFENACIN SUCCINATE 10 MG PO TABS
10.0000 mg | ORAL_TABLET | Freq: Every day | ORAL | 4 refills | Status: DC
  Filled 2022-04-27: qty 30, 30d supply, fill #0

## 2022-04-27 MED ORDER — BUSPIRONE HCL 10 MG PO TABS
10.0000 mg | ORAL_TABLET | Freq: Two times a day (BID) | ORAL | 4 refills | Status: DC
  Filled 2022-05-30: qty 60, 30d supply, fill #0
  Filled 2022-07-26: qty 60, 30d supply, fill #1
  Filled 2022-08-29: qty 60, 30d supply, fill #2
  Filled 2022-10-03: qty 60, 30d supply, fill #3
  Filled 2022-10-28: qty 60, 30d supply, fill #4

## 2022-05-07 ENCOUNTER — Other Ambulatory Visit (HOSPITAL_BASED_OUTPATIENT_CLINIC_OR_DEPARTMENT_OTHER): Payer: Self-pay

## 2022-05-10 DIAGNOSIS — M791 Myalgia, unspecified site: Secondary | ICD-10-CM | POA: Diagnosis not present

## 2022-05-10 DIAGNOSIS — M7050 Other bursitis of knee, unspecified knee: Secondary | ICD-10-CM | POA: Diagnosis not present

## 2022-05-10 DIAGNOSIS — H04123 Dry eye syndrome of bilateral lacrimal glands: Secondary | ICD-10-CM | POA: Diagnosis not present

## 2022-05-10 DIAGNOSIS — Z8759 Personal history of other complications of pregnancy, childbirth and the puerperium: Secondary | ICD-10-CM | POA: Diagnosis not present

## 2022-05-10 DIAGNOSIS — R768 Other specified abnormal immunological findings in serum: Secondary | ICD-10-CM | POA: Diagnosis not present

## 2022-05-15 ENCOUNTER — Other Ambulatory Visit (HOSPITAL_BASED_OUTPATIENT_CLINIC_OR_DEPARTMENT_OTHER): Payer: Self-pay

## 2022-05-20 ENCOUNTER — Encounter: Payer: Self-pay | Admitting: Family Medicine

## 2022-05-20 ENCOUNTER — Other Ambulatory Visit: Payer: Self-pay | Admitting: Sports Medicine

## 2022-05-20 DIAGNOSIS — M62838 Other muscle spasm: Secondary | ICD-10-CM

## 2022-05-20 DIAGNOSIS — M2550 Pain in unspecified joint: Secondary | ICD-10-CM

## 2022-05-21 ENCOUNTER — Other Ambulatory Visit (HOSPITAL_BASED_OUTPATIENT_CLINIC_OR_DEPARTMENT_OTHER): Payer: Self-pay

## 2022-05-21 ENCOUNTER — Other Ambulatory Visit: Payer: Self-pay

## 2022-05-21 DIAGNOSIS — M62838 Other muscle spasm: Secondary | ICD-10-CM

## 2022-05-21 DIAGNOSIS — M2550 Pain in unspecified joint: Secondary | ICD-10-CM

## 2022-05-21 MED ORDER — CYCLOBENZAPRINE HCL 5 MG PO TABS
5.0000 mg | ORAL_TABLET | Freq: Every evening | ORAL | 0 refills | Status: DC | PRN
  Filled 2022-05-21: qty 30, 30d supply, fill #0

## 2022-05-21 MED ORDER — CYCLOBENZAPRINE HCL 5 MG PO TABS
5.0000 mg | ORAL_TABLET | Freq: Every evening | ORAL | 0 refills | Status: DC | PRN
  Filled 2022-05-21: qty 14, 14d supply, fill #0

## 2022-05-21 MED ORDER — MELOXICAM 15 MG PO TABS
15.0000 mg | ORAL_TABLET | Freq: Every day | ORAL | 0 refills | Status: DC
  Filled 2022-05-21: qty 14, 14d supply, fill #0

## 2022-05-21 MED ORDER — MELOXICAM 15 MG PO TABS
15.0000 mg | ORAL_TABLET | Freq: Every day | ORAL | 0 refills | Status: DC
  Filled 2022-05-21: qty 30, 30d supply, fill #0

## 2022-05-21 NOTE — Telephone Encounter (Signed)
I have provided a 2 week refill that should last patient until next office visit with Dr. Katrinka Blazing. I do not recommend continued, long term use of NSAIDs or muscle relaxers without evaluation.  No further action needed at this time.

## 2022-05-29 NOTE — Progress Notes (Unsigned)
Carmen [name] [address] [place] [address] Phone: [phone] Subjective:    I'm seeing this patient by the request  of:  Marthenia Rolling, MD  CC: neck pain   CBS:[id number]  04/12/2022 Patient is   This could be more close lateral aspect discussed with patient about different treatment options and given Toradol and Depo-Medrol injection.  We discussed with patient about prednisone for 5 days. Gabapentin given for nighttime relief.  Concerned that this is more of a follow-up with me again in 6-8 weeks     Update 05/31/2022 Carmen [name] is a 41 y.o. female coming in with complaint of neck spasm. Patient states it is much better than it was, but when she tried to do chores or anything really active it causes more pain. But the flare has improved.  Patient feels like it is more in the shoulder blade.  States that the massage seems to be the most beneficial but patient has.     Past Medical History:  Diagnosis Date   Back pain    Costochondritis 09/13/2010   GERD (gastroesophageal reflux disease)    Heart palpitations    long history of    Heartburn    History of chest pain    History of fatigue    History of kidney stones    History of migraine headaches    History of tilt table evaluation    IMPRESSION: Positive tilt table study with significant cardioinhibitory  response.   Hyperlipidemia    Hypertension    Hypothyroidism    Kidney stones    history of   migraine    Neurocardiogenic syncope    Palpitations    Pericarditis 09/13/2010   Presence of permanent cardiac pacemaker    PSVT (paroxysmal supraventricular tachycardia)    Right ureteral calculus    Sleep apnea    mild osa no cpap needed   Sleeping difficulties    Stress    Stroke (HCC) 06/15/2019   no residual deficits, tpa given at time of stroke   Stroke [facility])    tia jan 2022   SVD (spontaneous vaginal delivery)    x 1   Syncope and collapse    neurocardiogenic  syncopy started age 24   Wears glasses    Past Surgical History:  Procedure Laterality Date   ANTERIOR AND POSTERIOR REPAIR N/A 11/14/2017   Procedure: ANTERIOR (CYSTOCELE) AND POSTERIOR REPAIR (RECTOCELE);  Surgeon: Silverio Lay, MD;  Location: [facility];  Service: Gynecology;  Laterality: N/A;   BILATERAL SALPINGECTOMY Bilateral 11/14/2017   Procedure: BILATERAL SALPINGECTOMY;  Surgeon: Silverio Lay, MD;  Location: [facility];  Service: Gynecology;  Laterality: Bilateral;   BLADDER SUSPENSION Bilateral 11/14/2017   Procedure: TRANSVAGINAL TAPE (TVT) PROCEDURE;  Surgeon: Osborn Coho, MD;  Location: [facility];  Service: Gynecology;  Laterality: Bilateral;   CHOLECYSTECTOMY  11/2007   Cholecystitis - Laparoscopic cholecystectomy with intraoperative  cholangiogram.   CYSTOSCOPY N/A 11/14/2017   Procedure: CYSTOSCOPY;  Surgeon: Osborn Coho, MD;  Location: [facility];  Service: Gynecology;  Laterality: N/A;   CYSTOSCOPY WITH RETROGRADE PYELOGRAM, URETEROSCOPY AND STENT PLACEMENT Right 06/25/2019   Procedure: CYSTOSCOPY WITH RETROGRADE PYELOGRAM, AND RIGHT  STENT PLACEMENT;  Surgeon: Noel Christmas, MD;  Location: [facility];  Service: Urology;  Laterality: Right;   CYSTOSCOPY WITH RETROGRADE PYELOGRAM, URETEROSCOPY AND STENT PLACEMENT Right 07/07/2019   Procedure: CYSTOSCOPY , URETEROSCOPY AND STENT REPLACEMENT;  Surgeon: Noel Christmas, MD;  Location: [facility];  Service: Urology;  Laterality: Right;   CYSTOSCOPY WITH RETROGRADE PYELOGRAM, URETEROSCOPY AND STENT PLACEMENT Right 03/23/2021   Procedure: CYSTOSCOPY WITH RETROGRADE PYELOGRAM, URETEROSCOPY AND STENT PLACEMENT;  Surgeon: Marcine Matar, MD;  Location[facility];  Service: Urology;  Laterality: Right;   HOLMIUM LASER APPLICATION Right 07/07/2019   Procedure: HOLMIUM LASER APPLICATION;  Surgeon: Noel Christmas, MD;  Location[facility];  Service: Urology;  Laterality: Right;    HOLMIUM LASER APPLICATION Right 03/23/2021   Procedure: HOLMIUM LASER APPLICATION;  Surgeon: Marcine Matar, MD;  Location: [facility];  Service: Urology;  Laterality: Right;   PACEMAKER INSERTION  2016   Tilt Table Study  07/17/2005   Positive tilt table study with significant cardioinhibitory  response.   VAGINAL HYSTERECTOMY N/A 11/14/2017   Procedure: HYSTERECTOMY VAGINAL;  Surgeon: Silverio Lay, MD;  Location: [facility];  Service: Gynecology;  Laterality: N/A;   WISDOM TOOTH EXTRACTION     age 5   Social History   Socioeconomic History   Marital status: Married    Spouse name: Not on file   Number of children: Not on file   Years of education: Not on file   Highest education level: Not on file  Occupational History   Occupation: mammography tech  Tobacco Use   Smoking status: Never   Smokeless tobacco: Never  Vaping Use   Vaping Use: Never used  Substance and Sexual Activity   Alcohol use: Yes    Comment: very rare   Drug use: Never   Sexual activity: Not on file  Other Topics Concern   Not on file  Social History Narrative   Not on file   Social Determinants of Health   Financial Resource Strain: Not on file  Food Insecurity: Not on file  Transportation Needs: Not on file  Physical Activity: Not on file  Stress: Not on file  Social Connections: Not on file   Allergies  Allergen Reactions   Tape Hives    Adhesive tape - tegaderm/paper tape ok   Dilaudid [Hydromorphone Hcl] Itching    "I just about crawled out of my skin"   Prochlorperazine Other (See Comments)    Akathisia with compazine in ED on 05/17/19.  Resolved with repeat dose of benadryl.      Quinolones Nausea Only   Reglan [Metoclopramide] Other (See Comments)    flinging legs and "not feeling right".    Family History  Problem Relation Age of Onset   Hypertension Mother    Heart disease Mother    Cancer Mother        breast   Breast cancer Mother 41   Diabetes Mother     Hyperlipidemia Mother    Thyroid disease Mother    Depression Mother    Anxiety disorder Mother    Bipolar disorder Mother    Liver disease Mother    Alcoholism Mother    Obesity Mother    Heart attack Father    Hyperlipidemia Father    Hepatitis C Father    Diabetes Father    Hypertension Father    Heart disease Father    Sudden Cardiac Death Father    Depression Father    Anxiety disorder Father    Liver disease Father    Alcoholism Father    Drug abuse Father    Obesity Father    Cancer Maternal Aunt        Breast   Breast cancer Maternal Aunt 2  Breast cancer Maternal Grandmother 68    Current Outpatient Medications (Endocrine & Metabolic):    levothyroxine (SYNTHROID) 100 MCG tablet, Take 1 tablet by mouth once daily   levothyroxine (SYNTHROID) 112 MCG tablet, Take 1 tablet (112 mcg total) by mouth daily before breakfast.  Current Outpatient Medications (Cardiovascular):    hydrochlorothiazide (HYDRODIURIL) 25 MG tablet, Take 1 tablet (25 mg total) by mouth in the morning.   metoprolol succinate (TOPROL-XL) 100 MG 24 hr tablet, Take one tablet (100 mg dose) by mouth daily.   metoprolol succinate (TOPROL-XL) 50 MG 24 hr tablet, Take 2 tablets (100 mg total) by mouth in the morning AND 1 tablet (50 mg total) every evening.  Current Outpatient Medications (Respiratory):    promethazine (PHENERGAN) 25 MG tablet, Take 25 mg by mouth every 6 (six) hours as needed for nausea or vomiting.  Current Outpatient Medications (Analgesics):    meloxicam (MOBIC) 15 MG tablet, Take 1 tablet (15 mg total) by mouth daily.   SUMAtriptan (IMITREX) 100 MG tablet, Take 1 tablet by mouth once daily as needed for migraine. May repeat ONCE in 2 hours.   Current Outpatient Medications (Other):    busPIRone (BUSPAR) 10 MG tablet, Take 1 tablet (10 mg total) by mouth 2 (two) times daily.   cyclobenzaprine (FLEXERIL) 5 MG tablet, Take 1 tablet (5 mg total) by mouth at bedtime as needed for  muscle spasms.   gabapentin (NEURONTIN) 100 MG capsule, Take 2 capsules (200 mg total) by mouth at bedtime.   QUEtiapine (SEROQUEL) 25 MG tablet, Take 1 tablet (25 mg total) by mouth at bedtime.   Semaglutide-Weight Management (WEGOVY) 2.4 MG/0.75ML SOAJ, Inject 2.4 mg into the skin once a week.   Vitamin D, Ergocalciferol, (DRISDOL) 1.25 MG (50000 UNIT) CAPS capsule, Take 1 capsule (50,000 Units total) by mouth every 7 (seven) days.   Reviewed prior external information including notes and imaging from  primary care provider As well as notes that were available from care everywhere and other healthcare systems.  Past medical history, social, surgical and family history all reviewed in electronic medical record.  No pertanent information unless stated regarding to the chief complaint.   Review of Systems:  No headache, visual changes, nausea, vomiting, diarrhea, constipation, dizziness, abdominal pain, skin rash, fevers, chills, night sweats, weight loss, swollen lymph nodes,  joint swelling, chest pain, shortness of breath, mood changes. POSITIVE muscle aches, , fatigue  Objective  Blood pressure 110/68, pulse 82, height 5\' 2"  (1.575 m), weight 180 lb (81.6 kg), SpO2 99 %.   General: No apparent distress alert and oriented x3 mood and affect normal, dressed appropriately.  HEENT: Pupils equal, extraocular movements intact  Respiratory: Patient's speak in full sentences and does not appear short of breath  Cardiovascular: No lower extremity edema, non tender, no erythema  Patient does have trigger points noted mostly in the left side of the parascapular region.  Seems to go on the medial aspect of the scapula.  Seems to be in the rhomboid, left her scapula and trapezius.   After verbal consent patient was prepped with alcohol swabs and with a 25-gauge half inch needle injected in 4 distinct trigger points in the left shoulder region including the rhomboid, trapezius, and left her scapula.   Total amount only [name] used was 3 cc of 0.5% Marcaine and 1 cc of Kenalog 40 mg/mL.  Minimal blood loss.  Band-Aid placed.  Postinjection instructions given.   Impression and Recommendations:     The  above documentation has been reviewed and is accurate and complete Lyndal Pulley, DO

## 2022-05-30 ENCOUNTER — Other Ambulatory Visit (HOSPITAL_BASED_OUTPATIENT_CLINIC_OR_DEPARTMENT_OTHER): Payer: Self-pay

## 2022-05-31 ENCOUNTER — Encounter: Payer: Self-pay | Admitting: Family Medicine

## 2022-05-31 ENCOUNTER — Other Ambulatory Visit (HOSPITAL_BASED_OUTPATIENT_CLINIC_OR_DEPARTMENT_OTHER): Payer: Self-pay

## 2022-05-31 ENCOUNTER — Ambulatory Visit (INDEPENDENT_AMBULATORY_CARE_PROVIDER_SITE_OTHER): Payer: 59 | Admitting: Family Medicine

## 2022-05-31 VITALS — BP 110/68 | HR 82 | Ht 62.0 in | Wt 180.0 lb

## 2022-05-31 DIAGNOSIS — E039 Hypothyroidism, unspecified: Secondary | ICD-10-CM

## 2022-05-31 DIAGNOSIS — M25512 Pain in left shoulder: Secondary | ICD-10-CM

## 2022-05-31 MED ORDER — SUMATRIPTAN SUCCINATE 100 MG PO TABS
ORAL_TABLET | ORAL | 2 refills | Status: DC
  Filled 2022-05-31: qty 9, 30d supply, fill #0
  Filled 2023-02-23: qty 9, 30d supply, fill #1
  Filled ????-??-??: fill #2

## 2022-05-31 MED ORDER — LEVOTHYROXINE SODIUM 100 MCG PO TABS
100.0000 ug | ORAL_TABLET | Freq: Every day | ORAL | 0 refills | Status: DC
  Filled 2022-05-31: qty 90, 90d supply, fill #0

## 2022-05-31 MED ORDER — LEVOTHYROXINE SODIUM 112 MCG PO TABS
112.0000 ug | ORAL_TABLET | Freq: Every day | ORAL | 0 refills | Status: DC
  Filled 2022-05-31: qty 90, 90d supply, fill #0

## 2022-05-31 NOTE — Assessment & Plan Note (Signed)
Has had more fatigue and will increase the Synthroid to 112 while she waits further evaluation with endocrinology.

## 2022-05-31 NOTE — Assessment & Plan Note (Signed)
Patient given injections today.  Tolerated the procedure well.  Hopeful that this will make some improvement.  We discussed with patient about icing regimen and home exercises otherwise.  Continuing to work on Herbalist.  Patient will use massage as the other thing.  Declined any type of medications at this moment.  Follow-up again in 6 to 8 weeks

## 2022-05-31 NOTE — Patient Instructions (Addendum)
Good to see you  Trigger point injections given today Levothyroxine sent in  Follow up in 6 weeks

## 2022-06-01 ENCOUNTER — Other Ambulatory Visit (HOSPITAL_BASED_OUTPATIENT_CLINIC_OR_DEPARTMENT_OTHER): Payer: Self-pay

## 2022-06-04 ENCOUNTER — Other Ambulatory Visit (HOSPITAL_BASED_OUTPATIENT_CLINIC_OR_DEPARTMENT_OTHER): Payer: Self-pay

## 2022-06-05 ENCOUNTER — Other Ambulatory Visit (HOSPITAL_BASED_OUTPATIENT_CLINIC_OR_DEPARTMENT_OTHER): Payer: Self-pay

## 2022-06-06 ENCOUNTER — Other Ambulatory Visit (HOSPITAL_BASED_OUTPATIENT_CLINIC_OR_DEPARTMENT_OTHER): Payer: Self-pay

## 2022-06-06 MED ORDER — WEGOVY 1.7 MG/0.75ML ~~LOC~~ SOAJ
1.7000 mg | SUBCUTANEOUS | 0 refills | Status: DC
  Filled 2022-06-06: qty 3, 28d supply, fill #0

## 2022-06-07 ENCOUNTER — Other Ambulatory Visit (HOSPITAL_BASED_OUTPATIENT_CLINIC_OR_DEPARTMENT_OTHER): Payer: Self-pay

## 2022-06-08 ENCOUNTER — Other Ambulatory Visit (HOSPITAL_BASED_OUTPATIENT_CLINIC_OR_DEPARTMENT_OTHER): Payer: Self-pay

## 2022-06-12 ENCOUNTER — Other Ambulatory Visit (HOSPITAL_BASED_OUTPATIENT_CLINIC_OR_DEPARTMENT_OTHER): Payer: Self-pay

## 2022-06-20 ENCOUNTER — Other Ambulatory Visit (HOSPITAL_BASED_OUTPATIENT_CLINIC_OR_DEPARTMENT_OTHER): Payer: Self-pay

## 2022-06-20 MED ORDER — PROMETHAZINE HCL 25 MG PO TABS
25.0000 mg | ORAL_TABLET | Freq: Four times a day (QID) | ORAL | 0 refills | Status: DC | PRN
  Filled 2022-06-20: qty 30, 7d supply, fill #0

## 2022-06-21 ENCOUNTER — Other Ambulatory Visit (HOSPITAL_BASED_OUTPATIENT_CLINIC_OR_DEPARTMENT_OTHER): Payer: Self-pay

## 2022-06-21 MED ORDER — CLONAZEPAM 0.5 MG PO TABS
0.5000 mg | ORAL_TABLET | Freq: Two times a day (BID) | ORAL | 1 refills | Status: DC
  Filled 2022-06-21: qty 45, 22d supply, fill #0
  Filled 2022-08-08: qty 45, 22d supply, fill #1

## 2022-06-27 ENCOUNTER — Other Ambulatory Visit (HOSPITAL_BASED_OUTPATIENT_CLINIC_OR_DEPARTMENT_OTHER): Payer: Self-pay

## 2022-06-27 MED ORDER — ZEPBOUND 2.5 MG/0.5ML ~~LOC~~ SOAJ
SUBCUTANEOUS | 0 refills | Status: DC
  Filled 2022-06-27 – 2022-07-03 (×2): qty 1, 28d supply, fill #0
  Filled 2022-07-05 – 2022-07-12 (×2): qty 2, 28d supply, fill #0

## 2022-06-28 ENCOUNTER — Other Ambulatory Visit (HOSPITAL_BASED_OUTPATIENT_CLINIC_OR_DEPARTMENT_OTHER): Payer: Self-pay

## 2022-06-29 ENCOUNTER — Other Ambulatory Visit (HOSPITAL_BASED_OUTPATIENT_CLINIC_OR_DEPARTMENT_OTHER): Payer: Self-pay

## 2022-06-30 ENCOUNTER — Encounter (HOSPITAL_BASED_OUTPATIENT_CLINIC_OR_DEPARTMENT_OTHER): Payer: Self-pay | Admitting: Emergency Medicine

## 2022-06-30 ENCOUNTER — Emergency Department (HOSPITAL_BASED_OUTPATIENT_CLINIC_OR_DEPARTMENT_OTHER)
Admission: EM | Admit: 2022-06-30 | Discharge: 2022-06-30 | Disposition: A | Discharge status: Home / Self Care | Payer: Commercial Managed Care - PPO | Attending: Emergency Medicine | Admitting: Emergency Medicine

## 2022-06-30 ENCOUNTER — Other Ambulatory Visit: Payer: Self-pay

## 2022-06-30 ENCOUNTER — Emergency Department (HOSPITAL_BASED_OUTPATIENT_CLINIC_OR_DEPARTMENT_OTHER): Payer: Commercial Managed Care - PPO

## 2022-06-30 DIAGNOSIS — Z79899 Other long term (current) drug therapy: Secondary | ICD-10-CM | POA: Insufficient documentation

## 2022-06-30 DIAGNOSIS — N132 Hydronephrosis with renal and ureteral calculous obstruction: Secondary | ICD-10-CM | POA: Diagnosis not present

## 2022-06-30 DIAGNOSIS — N200 Calculus of kidney: Secondary | ICD-10-CM | POA: Diagnosis not present

## 2022-06-30 DIAGNOSIS — R109 Unspecified abdominal pain: Secondary | ICD-10-CM | POA: Diagnosis present

## 2022-06-30 LAB — URINALYSIS, MICROSCOPIC (REFLEX): RBC / HPF: >50 RBC/hpf (ref 0–5)

## 2022-06-30 LAB — URINALYSIS, ROUTINE W REFLEX MICROSCOPIC
Glucose, UA: NEGATIVE mg/dL
Ketones, ur: NEGATIVE mg/dL
Leukocytes,Ua: NEGATIVE
Nitrite: NEGATIVE
Protein, ur: 100 mg/dL — AB
Specific Gravity, Urine: >=1.03 (ref 1.005–1.030)
pH: 6 (ref 5.0–8.0)

## 2022-06-30 LAB — CBC WITH DIFFERENTIAL/PLATELET
Abs Immature Granulocytes: 0.02 10*3/uL (ref 0.00–0.07)
Basophils Absolute: 0 10*3/uL (ref 0.0–0.1)
Basophils Relative: 0 %
Eosinophils Absolute: 0.2 10*3/uL (ref 0.0–0.5)
Eosinophils Relative: 2 %
HCT: 43.4 % (ref 36.0–46.0)
Hemoglobin: 15.2 g/dL — ABNORMAL HIGH (ref 12.0–15.0)
Immature Granulocytes: 0 %
Lymphocytes Relative: 24 %
Lymphs Abs: 1.8 10*3/uL (ref 0.7–4.0)
MCH: 31.7 pg (ref 26.0–34.0)
MCHC: 35 g/dL (ref 30.0–36.0)
MCV: 90.4 fL (ref 80.0–100.0)
Monocytes Absolute: 0.5 10*3/uL (ref 0.1–1.0)
Monocytes Relative: 7 %
Neutro Abs: 5 10*3/uL (ref 1.7–7.7)
Neutrophils Relative %: 67 %
Platelets: 235 10*3/uL (ref 150–400)
RBC: 4.8 MIL/uL (ref 3.87–5.11)
RDW: 12.7 % (ref 11.5–15.5)
WBC: 7.5 10*3/uL (ref 4.0–10.5)
nRBC: 0 % (ref 0.0–0.2)

## 2022-06-30 LAB — COMPREHENSIVE METABOLIC PANEL
ALT: 25 U/L (ref 0–44)
AST: 23 U/L (ref 15–41)
Albumin: 4.4 g/dL (ref 3.5–5.0)
Alkaline Phosphatase: 50 U/L (ref 38–126)
Anion gap: 8 (ref 5–15)
BUN: 15 mg/dL (ref 6–20)
CO2: 25 mmol/L (ref 22–32)
Calcium: 9.2 mg/dL (ref 8.9–10.3)
Chloride: 103 mmol/L (ref 98–111)
Creatinine, Ser: 0.89 mg/dL (ref 0.44–1.00)
GFR, Estimated: >60 mL/min (ref >60)
Glucose, Bld: 90 mg/dL (ref 70–99)
Potassium: 3.7 mmol/L (ref 3.5–5.1)
Sodium: 136 mmol/L (ref 135–145)
Total Bilirubin: 0.4 mg/dL (ref 0.3–1.2)
Total Protein: 7.7 g/dL (ref 6.5–8.1)

## 2022-06-30 LAB — LIPASE, BLOOD: Lipase: 75 U/L — ABNORMAL HIGH (ref 11–51)

## 2022-06-30 MED ORDER — DIPHENHYDRAMINE HCL 50 MG/ML IJ SOLN
25.0000 mg | Freq: Once | INTRAMUSCULAR | Status: AC
  Administered 2022-06-30: 25 mg via INTRAVENOUS
  Filled 2022-06-30: qty 1

## 2022-06-30 MED ORDER — ONDANSETRON HCL 4 MG PO TABS: 4.0000 mg | ORAL_TABLET | Freq: Four times a day (QID) | ORAL | 0 refills | Status: DC

## 2022-06-30 MED ORDER — ONDANSETRON HCL 4 MG/2ML IJ SOLN
4.0000 mg | Freq: Once | INTRAMUSCULAR | Status: AC
  Administered 2022-06-30: 4 mg via INTRAVENOUS
  Filled 2022-06-30: qty 2

## 2022-06-30 MED ORDER — OXYCODONE-ACETAMINOPHEN 5-325 MG PO TABS: 1.0000 | ORAL_TABLET | Freq: Four times a day (QID) | ORAL | 0 refills | Status: DC | PRN

## 2022-06-30 MED ORDER — MORPHINE SULFATE (PF) 4 MG/ML IV SOLN
4.0000 mg | Freq: Once | INTRAVENOUS | Status: AC
  Administered 2022-06-30: 4 mg via INTRAVENOUS
  Filled 2022-06-30: qty 1

## 2022-06-30 MED ORDER — ONDANSETRON HCL 4 MG/2ML IJ SOLN: 4.0000 mg | Freq: Once | INTRAMUSCULAR | Status: DC

## 2022-06-30 MED ORDER — SODIUM CHLORIDE 0.9 % IV SOLN
12.5000 mg | Freq: Once | INTRAVENOUS | Status: AC
  Administered 2022-06-30: 12.5 mg via INTRAVENOUS
  Filled 2022-06-30: qty 0.5

## 2022-06-30 MED ORDER — SODIUM CHLORIDE 0.9 % IV SOLN: INTRAVENOUS | Status: DC | PRN

## 2022-06-30 MED ORDER — PROMETHAZINE HCL 25 MG/ML IJ SOLN
INTRAMUSCULAR | Status: AC
  Filled 2022-06-30: qty 1

## 2022-06-30 MED ORDER — KETOROLAC TROMETHAMINE 30 MG/ML IJ SOLN
30.0000 mg | Freq: Once | INTRAMUSCULAR | Status: AC
  Administered 2022-06-30: 30 mg via INTRAVENOUS
  Filled 2022-06-30: qty 1

## 2022-06-30 MED ORDER — PROMETHAZINE HCL 25 MG PO TABS: 25.0000 mg | ORAL_TABLET | Freq: Four times a day (QID) | ORAL | 0 refills | Status: DC | PRN

## 2022-06-30 MED ORDER — TAMSULOSIN HCL 0.4 MG PO CAPS: 0.4000 mg | ORAL_CAPSULE | Freq: Every day | ORAL | 0 refills | Status: DC

## 2022-06-30 MED ORDER — HYDROMORPHONE HCL 1 MG/ML IJ SOLN
1.0000 mg | Freq: Once | INTRAMUSCULAR | Status: AC
  Administered 2022-06-30: 1 mg via INTRAVENOUS
  Filled 2022-06-30: qty 1

## 2022-06-30 NOTE — Discharge Instructions (Signed)
You have a 5 mm kidney stone causing pain.  Please take medication prescribed.  Use urine strainer to help capture the stone and bring it to your urologist for further assessment.  Stay hydrated.  Return if you have any concern.

## 2022-06-30 NOTE — ED Triage Notes (Signed)
Pt w/ LT flank pain w/ radiation to LLQ since Thur; worse now; hx of kidney stones

## 2022-06-30 NOTE — ED Notes (Signed)
Pt A&OX4 ambulatory at d/c with independent steady gait. Pt reports she is feeling better. Pt verbalized understanding of d/c instructions, prescriptions and follow up care.

## 2022-06-30 NOTE — ED Provider Notes (Signed)
[facility] EMERGENCY DEPARTMENT Provider Note   CSN: [id number] Arrival date & time: 06/30/22  1157     History  Chief Complaint  Patient presents with   Flank Pain    Carmen [name] is a 42 y.o. female.  The history is provided by the patient and medical records. No language interpreter was used.  Flank Pain     42 year old female with multiple comorbidities seen history of kidney stone, prior stroke, migraine, GERD presenting with complaints of flank pain.  Patient reports she developed left-sided abdominal pain 3 days ago.  She described more as an uncomfortable indigestion symptoms that she thought could be a GI bug.  The pain intensified today and radiates towards her left flank.  She is unable to find any comfortable position.  She tried taking OTC medication without relief.  She endorses nausea without vomiting.  She noticed decrease in her urine output but did not notice any blood in it.  No change in bowel or bladder habit.  No fever chills productive cough.  She is not on any blood thinner medication except for aspirin.  She has had prior kidney stone requiring stenting.  Home Medications Prior to Admission medications   Medication Sig Start Date End Date Taking? Authorizing Provider  busPIRone (BUSPAR) 10 MG tablet Take 1 tablet (10 mg total) by mouth 2 (two) times daily. 04/27/22     clonazePAM (KLONOPIN) 0.5 MG tablet Take 1 tablet (0.5 mg total) by mouth in the morning and at bedtime. 06/20/22     cyclobenzaprine (FLEXERIL) 5 MG tablet Take 1 tablet (5 mg total) by mouth at bedtime as needed for muscle spasms. 05/21/22   [name], DO  gabapentin (NEURONTIN) 100 MG capsule Take 2 capsules (200 mg total) by mouth at bedtime. 04/12/22   [name], DO  hydrochlorothiazide (HYDRODIURIL) 25 MG tablet Take 1 tablet (25 mg total) by mouth in the morning. 03/13/22     levothyroxine (SYNTHROID) 100 MCG tablet Take 1 tablet (100 mcg total) by mouth daily.  05/31/22     levothyroxine (SYNTHROID) 112 MCG tablet Take 1 tablet (112 mcg total) by mouth daily before breakfast. 05/31/22   [name], DO  meloxicam (MOBIC) 15 MG tablet Take 1 tablet (15 mg total) by mouth daily. 05/21/22   [name], DO  metoprolol succinate (TOPROL-XL) 100 MG 24 hr tablet Take one tablet (100 mg dose) by mouth daily. 04/02/22     metoprolol succinate (TOPROL-XL) 50 MG 24 hr tablet Take 2 tablets (100 mg total) by mouth in the morning AND 1 tablet (50 mg total) every evening. 12/06/21     promethazine (PHENERGAN) 25 MG tablet Take 25 mg by mouth every 6 (six) hours as needed for nausea or vomiting.    [provider]  promethazine (PHENERGAN) 25 MG tablet Take 1 tablet (25 mg total) by mouth every 6 (six) hours as needed for nausea for up to 7 days. 06/20/22     QUEtiapine (SEROQUEL) 25 MG tablet Take 1 tablet (25 mg total) by mouth at bedtime. 03/04/22     Semaglutide-Weight Management (WEGOVY) 1.7 MG/0.75ML SOAJ Inject 1.7 mg into the skin once a week. 06/05/22     Semaglutide-Weight Management (WEGOVY) 2.4 MG/0.75ML SOAJ Inject 2.4 mg into the skin once a week. 10/13/21     SUMAtriptan (IMITREX) 100 MG tablet Take 1 tablet by mouth once daily as needed for migraine. May repeat ONCE in 2 hours. 05/31/22  tirzepatide (ZEPBOUND) 2.5 MG/0.5ML Pen Inject 2.5 mg into the skin once a week. 06/27/22     Vitamin D, Ergocalciferol, (DRISDOL) 1.25 MG (50000 UNIT) CAPS capsule Take 1 capsule (50,000 Units total) by mouth every 7 (seven) days. 04/13/22   Lyndal Pulley, DO      Allergies    Tape, Dilaudid [hydromorphone hcl], Prochlorperazine, Quinolones, and Reglan [metoclopramide]    Review of Systems   Review of Systems  Genitourinary:  Positive for flank pain.  All other systems reviewed and are negative.   Physical Exam Updated Vital Signs BP (!) 147/116 (BP Location: Right Arm)   Pulse 95   Temp 98.1 F (36.7 C) (Oral)   Resp 18   Ht 5' 2.5" (1.588  m)   Wt 79.4 kg   LMP  (LMP Unknown)   SpO2 100%   BMI 31.50 kg/m  Physical Exam Vitals and nursing note reviewed.  Constitutional:      General: She is not in acute distress.    Appearance: She is well-developed.     Comments: Appears uncomfortable but nontoxic  HENT:     Head: Atraumatic.  Eyes:     Conjunctiva/sclera: Conjunctivae normal.  Cardiovascular:     Rate and Rhythm: Normal rate and regular rhythm.     Pulses: Normal pulses.     Heart sounds: Normal heart sounds.  Pulmonary:     Effort: Pulmonary effort is normal.  Abdominal:     Tenderness: There is abdominal tenderness (Tenderness to left lower quadrant without guarding or rebound tenderness.). There is left CVA tenderness. There is no right CVA tenderness.  Musculoskeletal:     Cervical back: Neck supple.  Skin:    Findings: No rash.  Neurological:     Mental Status: She is alert.  Psychiatric:        Mood and Affect: Mood normal.     ED Results / Procedures / Treatments   Labs (all labs ordered are listed, but only abnormal results are displayed) Labs Reviewed  URINALYSIS, ROUTINE W REFLEX MICROSCOPIC - Abnormal; Notable for the following components:      Result Value   APPearance CLOUDY (*)    Hgb urine dipstick LARGE (*)    Bilirubin Urine SMALL (*)    Protein, ur 100 (*)    All other components within normal limits  CBC WITH DIFFERENTIAL/PLATELET - Abnormal; Notable for the following components:   Hemoglobin 15.2 (*)    All other components within normal limits  LIPASE, BLOOD - Abnormal; Notable for the following components:   Lipase 75 (*)    All other components within normal limits  URINALYSIS, MICROSCOPIC (REFLEX) - Abnormal; Notable for the following components:   Bacteria, UA RARE (*)    All other components within normal limits  COMPREHENSIVE METABOLIC PANEL    EKG None  Radiology CT Renal Stone Study  Result Date: 06/30/2022 CLINICAL DATA:  Abdominal/flank pain.  Rule out  kidney stone. EXAM: CT ABDOMEN AND PELVIS WITHOUT CONTRAST TECHNIQUE: Multidetector CT imaging of the abdomen and pelvis was performed following the standard protocol without IV contrast. RADIATION DOSE REDUCTION: This exam was performed according to the departmental dose-optimization program which includes automated exposure control, adjustment of the mA and/or kV according to patient size and/or use of iterative reconstruction technique. COMPARISON:  09/24/2021 FINDINGS: Lower chest: No acute abnormality. Hepatobiliary: No focal liver abnormality is seen. Status post cholecystectomy. No biliary dilatation. Pancreas: Unremarkable. No pancreatic ductal dilatation or surrounding inflammatory changes. Spleen:  Normal in size without focal abnormality. Adrenals/Urinary Tract: Normal adrenal glands. Punctate stone noted in the inferior pole of the right kidney, image 42/2 2 stones within the inferior pole of the left kidney measure up to 4 mm. Mild left hydronephrosis and hydroureter. Within the distal ureter, proximal to the urinary bladder there is a stone measuring 5 mm, image 74/2. Bladder appears within normal limits. Stomach/Bowel: Stomach is within normal limits. Appendix appears normal. No evidence of bowel wall thickening, distention, or inflammatory changes. Vascular/Lymphatic: No significant vascular findings are present. No enlarged abdominal or pelvic lymph nodes. Reproductive: Status post hysterectomy bilateral adnexa are unremarkable. Other: No free fluid or fluid collection. No signs of pneumoperitoneum. Musculoskeletal: No acute or significant osseous findings. IMPRESSION: 1. Left-sided hydronephrosis and hydroureter secondary to 5 mm distal left ureteral calculus. 2. Bilateral nephrolithiasis. Electronically Signed   By: Signa Kell M.D.   On: 06/30/2022 13:12    Procedures .Critical Care  Performed by: Fayrene Helper, PA-C Authorized by: Fayrene Helper, PA-C   Critical care provider statement:     Critical care time (minutes):  30   Critical care was time spent personally by me on the following activities:  Development of treatment plan with patient or surrogate, discussions with consultants, evaluation of patient's response to treatment, examination of patient, ordering and review of laboratory studies, ordering and review of radiographic studies, ordering and performing treatments and interventions, pulse oximetry, re-evaluation of patient's condition and review of old charts     Medications Ordered in ED Medications  morphine (PF) 4 MG/ML injection 4 mg (4 mg Intravenous Given 06/30/22 1252)  ondansetron (ZOFRAN) injection 4 mg (4 mg Intravenous Given 06/30/22 1251)  ketorolac (TORADOL) 30 MG/ML injection 30 mg (30 mg Intravenous Given 06/30/22 1348)    ED Course/ Medical Decision Making/ A&P                           Medical Decision Making Amount and/or Complexity of Data Reviewed Labs: ordered. Radiology: ordered.  Risk Prescription drug management.   BP (!) 147/116 (BP Location: Right Arm)   Pulse 95   Temp 98.1 F (36.7 C) (Oral)   Resp 18   Ht 5' 2.5" (1.588 m)   Wt 79.4 kg   LMP  (LMP Unknown)   SpO2 100%   BMI 31.50 kg/m   48:33 PM  42 year old female with multiple comorbidities seen history of kidney stone, prior stroke, migraine, GERD presenting with complaints of flank pain.  Patient reports she developed left-sided abdominal pain 3 days ago.  She described more as an uncomfortable indigestion symptoms that she thought could be a GI bug.  The pain intensified today and radiates towards her left flank.  She is unable to find any comfortable position.  She tried taking OTC medication without relief.  She endorses nausea without vomiting.  She noticed decrease in her urine output but did not notice any blood in it.  No change in bowel or bladder habit.  No fever chills productive cough.  She is not on any blood thinner medication except for aspirin.  She has had prior  kidney stone requiring stenting.  On exam patient appears uncomfortable pacing about the room.  Heart and lungs are normal, left CVA tenderness noted.  Tenderness to left lower quadrant without guarding or rebound tenderness.  Vitals are reviewed with an elevated blood pressure of 147/116.  She is afebrile no hypoxia.  Given history of kidney  stone passed and now having left flank pain and lower abdominal pain, will obtain abdomen pelvis CT scan for further assessment.  Workup initiated.  Patient given supportive care including opiate pain medication, and antinausea medication.  -Labs ordered, independently viewed and interpreted by me.  Labs remarkable for UA showing large Hgb but no evidence suggestive of UTI.  Mildly elevated lipase of 75 -The patient was maintained on a cardiac monitor.  I personally viewed and interpreted the cardiac monitored which showed an underlying rhythm of: NSR -Imaging independently viewed and interpreted by me and I agree with radiologist's interpretation.  Result remarkable for CT renal stone study showing 58mm distal left ureteral calculous with hydronephrosis and hydroureter -This patient presents to the ED for concern of flank pain, this involves an extensive number of treatment options, and is a complaint that carries with it a high risk of complications and morbidity.  The differential diagnosis includes kidney stone, pyelonephritis, dissection, colitis -Co morbidities that complicate the patient evaluation includes hx of kidney stone -Treatment includes toradol, morphine, dilaudid, phenergan, zofran, benadryl -Reevaluation of the patient after these medicines showed that the patient improved -PCP office notes or outside notes reviewed -Escalation to admission/observation considered: patients feels much better, is comfortable with discharge, and will follow up with PCP -Prescription medication considered, patient comfortable with vicodin, zofran, and flomax -Social  Determinant of Health considered which includes depression  2:09 PM Workup today is remarkable for CT abdomen pelvis demonstrated 5 mm to the distal left ureteral calculus with associated hydronephrosis and hydroureter.  This finding is consistent with patient presentation.  Despite receiving multiple dose of pain medication pain is not adequately controlled.  Will continue to provide further pain management.  3:07 PM Fortunately after receiving multiple doses of opiate pain medication as well as antiemetic patient states her symptom is improving and she is feeling better.  She is able to tolerate p.o. and is stable to be discharged with outpatient follow-up to [facility].  I encourage hydration, take medication as needed, and to return if her condition worsen.  Patient will need to use a urine strainer to help capture the stone to share with the urologist.        Final Clinical Impression(s) / ED Diagnoses Final diagnoses:  Kidney stone    Rx / DC Orders ED Discharge Orders          Ordered    ondansetron (ZOFRAN) 4 MG tablet  Every 6 hours        06/30/22 1511    tamsulosin (FLOMAX) 0.4 MG CAPS capsule  Daily        06/30/22 1511    oxyCODONE-acetaminophen (PERCOCET) 5-325 MG tablet  Every 6 hours PRN        06/30/22 1511              Fayrene Helper, PA-C 06/30/22 1512    Alvira Monday, MD 06/30/22 2303

## 2022-07-02 ENCOUNTER — Other Ambulatory Visit (HOSPITAL_BASED_OUTPATIENT_CLINIC_OR_DEPARTMENT_OTHER): Payer: Self-pay

## 2022-07-02 DIAGNOSIS — N202 Calculus of kidney with calculus of ureter: Secondary | ICD-10-CM | POA: Diagnosis not present

## 2022-07-03 ENCOUNTER — Other Ambulatory Visit (HOSPITAL_BASED_OUTPATIENT_CLINIC_OR_DEPARTMENT_OTHER): Payer: Self-pay

## 2022-07-04 ENCOUNTER — Other Ambulatory Visit: Payer: Self-pay | Admitting: Urology

## 2022-07-04 DIAGNOSIS — N202 Calculus of kidney with calculus of ureter: Secondary | ICD-10-CM | POA: Diagnosis not present

## 2022-07-04 NOTE — H&P (Signed)
Office Visit Report     07/02/2022   --------------------------------------------------------------------------------   Carmen [name]  MRN: [id number]  DOB: Sep 15, 1980, 42 year old Female  SSN:    PRIMARY CARE:  Sable Feil, MD  PRIMARY CARE FAX:  [phone]  REFERRING:  Earnstine Regal, PA  PROVIDER:  Festus Aloe, M.D.  SUPERVISING:  Irine Seal, M.D.  TREATING:  Ashok Norris, PA-C  LOCATION:  [facility] [phone]     --------------------------------------------------------------------------------   CC: I have pain in the flank.  HPI: Carmen [name] is a 42 year-old female established patient who is here for flank pain.  The problem is on the right side. The pain is intermittent. The pain does radiate.   -CTSS from 09/24/21 revealed nonobstructing left renal stones with no other evidence of obstructive uropathy or GU abnormalities. She was noted to have a small amount of free fluid in her pelvis with no identifiable source.  -UA, BMP and CBC from 09/25/21 were WNL  -The patient reports a 24-hour history of right-sided flank pain that radiated to the left inguinal region and is associated with general malaise and nausea. She denies fever/chills, emesis, dysuria or hematuria. No specific alleviating or aggravating factors.  -She has a long history of kidney stones and has required multiple ureteroscopy's in the past.   07/02/2022:  Patient presents with her husband at her side in follow-up from ED visit for diagnosed 5 mm obstructing distal left ureter stone with mild hydroureter and hydronephrosis. She has initiated tamsulosin. At that time, BUN 15, creatinine 0.89, GFR >60, WBC 7.5, UA with gross hematuria and protein only. Since discharge, patient continues to endorse ureteral colic/pain, with sensations of abdominal bloating, bladder spasms. She is very uncomfortable, and is concerned that she will not pass her stone. Patient inquires as to whether her  stone is truly 5 mm. She is a radiology tech herself. She does have some anxiety over this episode. With her presentation, it is possible that her stone is at the left UVJ.   Today, patient endorses intermittent gross hematuria, suprapubic discomfort, sensation of abdominal bloating, urgency, frequency, and intermittent dysuria. She denies flank pain, fever/chills, nausea/vomiting.     ALLERGIES: Dilaudid - Itching Quinolones Reglan    MEDICATIONS: Tamsulosin Hcl 0.4 mg capsule  Diazepam 5 mg tablet tablet PRN  Imitrex 50 mg tablet 1 tablet PO Daily  Meloxicam  Minoxidil 10 mg tablet  Oxycodone Hcl PRN  Protonix 40 mg tablet, delayed release  Protriptyline Hcl 10 mg tablet  Synthroid 50 mcg tablet 1 tablet PO Daily  Toprol Xl 50 mg tablet, extended release 24 hr 1 tablet PO Daily  Zofran PRN     Notes: Stopped Ozempic 02/2021   GU PSH: Cystoscopy - 2020 Cystoscopy Insert Stent, Right - 2020 Hysterectomy Ureteroscopic laser litho - 03/23/2021, Right - 2021       PSH Notes: TVT  anterior/posterior vaginal repair  salpingectomy   NON-GU PSH: Cholecystectomy (laparoscopic) Pacemaker placement     GU PMH: Flank Pain - 09/25/2021 Renal calculus - 09/25/2021, Intermittently symptomatic right renal stone without other complicating factors, - 6/59/9357, The patient would like to complete 24 hour urine in the future. She is on Topamax and would like to know if this is causing her stones., - 2021 Acute Cystitis/UTI - 03/28/2021, - 2019 Ureteral calculus - 03/28/2021, I recommend patient drinks 2-3 L of water a day, strain her urine, use Flomax/Percocet/Zofran as needed. She will follow-up in 2 weeks  to see if she has passed the stone., 10/10/2019 Gross hematuria - 03/17/2021, 10/10/18, 10/10/18, 2017-10-09 Renal and ureteral calculus (Stable) - 03/17/2021, (Stable), I discussed management of the 6 mm ureteral calculus which includes trial passage, staged procedure with stent today versus ureteroscopy  schedule at a later date with pain control in interim. Patient states she cannot weight due to pain and so will proceed with a cystoscopy and ureteral stent placement today. She has been NPO since midnight. I discussed the risks and benefits of the procedure with the patient and she would like to proceed. She understands that the stent may cause discomfort, urinary frequency, hematuria and other symptoms., 10/10/18 Microscopic hematuria, Patient has microscopic hematuria on urinalysis associated with flank pain and findings of punctate right renal calculi on recent imaging. She likely has a ureteral calculus as she is trying the past. We will reassess urinalysis in approximately 2 weeks to see if this has resolved. 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   Tobacco Use Assessment Completed: Used Tobacco in last 30 days? Social Drinker.  Does not use drugs. Drinks 2 caffeinated drinks per day. Patient's occupation Scientist, research (life sciences).    REVIEW OF SYSTEMS:    GU Review Female:   Patient reports trouble starting your stream. Patient denies frequent urination, hard to postpone urination, burning /pain with urination, get up at night to urinate, leakage of urine, stream starts and stops, have to strain to urinate, and being pregnant.  Gastrointestinal (Upper):   Patient reports nausea, vomiting, and indigestion/ heartburn.    Gastrointestinal (Lower):   Patient denies diarrhea and constipation.  Constitutional:   Patient denies fever, night sweats, weight loss, and fatigue.  Skin:   Patient denies skin rash/ lesion and itching.  Eyes:   Patient denies blurred vision and double vision.  Ears/ Nose/ Throat:   Patient denies sore throat and sinus problems.  Hematologic/Lymphatic:   Patient denies swollen glands and easy bruising.  Cardiovascular:   Patient denies leg swelling and chest pains.  Respiratory:   Patient denies cough and shortness of breath.  Endocrine:   Patient denies excessive thirst.  Musculoskeletal:   Patient denies back pain and joint pain.  Neurological:   Patient denies headaches and dizziness.  Psychologic:   Patient denies depression and anxiety.   VITAL SIGNS:      07/02/2022 09:50 AM  BP 121/89 mmHg  Pulse 88 /min  Temperature 98.4 F / 36.8 C   MULTI-SYSTEM PHYSICAL EXAMINATION:    Constitutional: Well-nourished. No physical deformities. Normally developed. Good grooming.  Respiratory: No labored breathing, no use of accessory muscles.   Cardiovascular: Normal temperature, normal extremity pulses, no swelling.   Skin: No paleness, no jaundice, no cyanosis.  Neurologic / Psychiatric: Oriented to time, oriented to place, oriented to person. No depression, no anxiety, no agitation.  Gastrointestinal: Mild suprapubic and left-sided CVA tenderness. No mass, no right-sided CVA tenderness,  no rigidity, non obese abdomen.      Complexity of Data:  X-Ray Review: Outside CT: Reviewed Films. Reviewed Report. Discussed With Patient. CLINICAL DATA: Abdominal/flank pain. Rule out kidney stone.   EXAM:  CT ABDOMEN AND PELVIS WITHOUT CONTRAST   TECHNIQUE:  Multidetector CT imaging of the abdomen and pelvis was performed  following the standard protocol without IV contrast.   RADIATION DOSE REDUCTION: This exam was performed according to the  departmental dose-optimization program which  includes automated  exposure control, adjustment of the mA and/or kV according to  patient size and/or use of iterative reconstruction technique.   COMPARISON: 09/24/2021   FINDINGS:  Lower chest: No acute abnormality.   Hepatobiliary: No focal liver abnormality is seen. Status post  cholecystectomy. No biliary dilatation.   Pancreas: Unremarkable. No pancreatic ductal dilatation or  surrounding inflammatory changes.   Spleen: Normal in size without focal abnormality.   Adrenals/Urinary Tract: Normal adrenal glands.   Punctate stone noted in the inferior pole of the right kidney, image  42/2 2 stones within the inferior pole of the left kidney measure up  to 4 mm. Mild left hydronephrosis and hydroureter. Within the distal  ureter, proximal to the urinary bladder there is a stone measuring 5  mm, image 74/2. Bladder appears within normal limits.   Stomach/Bowel: Stomach is within normal limits. Appendix appears  normal. No evidence of bowel wall thickening, distention, or  inflammatory changes.   Vascular/Lymphatic: No significant vascular findings are present. No  enlarged abdominal or pelvic lymph nodes.   Reproductive: Status post hysterectomy bilateral adnexa are  unremarkable.   Other: No free fluid or fluid collection. No signs of  pneumoperitoneum.   Musculoskeletal: No acute or significant osseous findings.   IMPRESSION:  1. Left-sided hydronephrosis and hydroureter secondary to 5 mm  distal left ureteral calculus.  2. Bilateral nephrolithiasis.    Electronically Signed  By: Signa Kell M.D.  On: 06/30/2022 13:12       PROCEDURES:         PVR Ultrasound - 51884  Scanned Volume: 91 cc         Urinalysis w/Scope Dipstick Dipstick Cont'd Micro  Color: Yellow Bilirubin: Neg mg/dL WBC/hpf: 0 - 5/hpf  Appearance: Cloudy Ketones: Neg mg/dL RBC/hpf: 3 - 16/SAY  Specific Gravity: 1.025 Blood: 3+ ery/uL Bacteria: Few (10-25/hpf)  pH: <=5.0 Protein: Neg  mg/dL Cystals: NS (Not Seen)  Glucose: Neg mg/dL Urobilinogen: 0.2 mg/dL Casts: NS (Not Seen)    Nitrites: Neg Trichomonas: Not Present    Leukocyte Esterase: Neg leu/uL Mucous: Not Present      Epithelial Cells: 6 - 10/hpf      Yeast: NS (Not Seen)      Sperm: Not Present    Notes: Micro performed on unspun urine due to QNS    ASSESSMENT:      ICD-10 Details  1 GU:   Flank Pain - R10.84 Left, Acute, Uncomplicated  2   Renal calculus - N20.0 Bilateral, Chronic, Stable  3   Ureteral calculus - N20.1 Left, Acute, Complicated Injury  4   Ureteral obstruction secondary to calculous - N13.2 Left, Acute, Uncomplicated   PLAN:           Orders X-Rays: Renal Ultrasound (Limited) - Left          Schedule Return Visit/Planned Activity: 2 Weeks - Office Visit, Extender, Renal Ultrasound (Limited)             Note: Buzzy Han  Document Letter(s):  Created for Patient: Clinical Summary         Notes:   Today, UA with microhematuria. No concerns for infection. We discussed plans going forward with options for continued MET versus definitive treatment. Patient has passed stones in the past, and has also required ureteroscopy. She is not a candidate for ESWL due to pacemaker. Patient would like to continue with MET at this time. She would like to avoid ureteroscopy if possible due to previous concerns and pain with stenting, and stents falling out/being accidentally pulled prior to due date.   With regards to patient's discomfort, we advised for use of NSAIDs versus narcotics. Patient endorses some constipation at this time. We advised for AZO use for dysuria, and naproxen for pain. Reserve narcotics for breakthrough pain. Patient does have some Toradol from previous stone events, and we advised against taking any additional NSAID doses for 24 hours should she decide to use Toradol. We also advised for stool softener and fiber supplementation to stave off constipation.   Follow-up in  2 weeks with renal ultrasound for continued evaluation of hydronephrosis. Provided patient with strainers today. She knows to call the office to inform us of stone passage. Interim return to clinic advised for worsening presentation, development of irritative or constitutional symptoms. Continue on tamsulosin. Patient and her husband both voiced understanding are amenable to this plan.        Next Appointment:      Next Appointment: 07/23/2022 03:30 PM    Appointment Type: Renal Ultrasound    Location: [facility] [phone]    Provider: Radiology Rm 2 Radiology Rm 2    Reason for Visit: 2 Weeks - Office Visit, Extender, Renal Ultrasound (Limited)      * Signed by Ashok Norris, PA-C on 07/02/22 at 11:23 AM (EST*      The information contained in this medical record document is considered private and confidential patient information. This information can only be used for the medical diagnosis and/or medical services that are being provided by the patient's selected caregivers. This information can only be distributed outside of the patient's care if the patient agrees and signs waivers of authorization for this information to be sent to an outside source or route.  Add: pt has not tolerated stone passage. She saw PA Juanda Crumble today and elected to proceed with left URS/HLL/stent. I will discuss possible staged procedure with patient.

## 2022-07-05 ENCOUNTER — Ambulatory Visit (HOSPITAL_BASED_OUTPATIENT_CLINIC_OR_DEPARTMENT_OTHER): Payer: Commercial Managed Care - PPO | Admitting: Anesthesiology

## 2022-07-05 ENCOUNTER — Ambulatory Visit (HOSPITAL_COMMUNITY): Payer: Commercial Managed Care - PPO | Admitting: Anesthesiology

## 2022-07-05 ENCOUNTER — Ambulatory Visit (HOSPITAL_COMMUNITY)
Admission: RE | Admit: 2022-07-05 | Discharge: 2022-07-05 | Disposition: A | Discharge status: Home / Self Care | Payer: Commercial Managed Care - PPO | Attending: Urology | Admitting: Urology

## 2022-07-05 ENCOUNTER — Ambulatory Visit (HOSPITAL_COMMUNITY): Payer: Commercial Managed Care - PPO

## 2022-07-05 ENCOUNTER — Encounter (HOSPITAL_COMMUNITY): Payer: Self-pay | Admitting: Urology

## 2022-07-05 ENCOUNTER — Other Ambulatory Visit: Payer: Self-pay

## 2022-07-05 ENCOUNTER — Encounter (HOSPITAL_COMMUNITY)
Admission: RE | Disposition: A | Discharge status: Home / Self Care | Payer: Self-pay | Source: Home / Self Care | Attending: Urology

## 2022-07-05 ENCOUNTER — Other Ambulatory Visit (HOSPITAL_BASED_OUTPATIENT_CLINIC_OR_DEPARTMENT_OTHER): Payer: Self-pay

## 2022-07-05 DIAGNOSIS — Z79899 Other long term (current) drug therapy: Secondary | ICD-10-CM | POA: Insufficient documentation

## 2022-07-05 DIAGNOSIS — N132 Hydronephrosis with renal and ureteral calculous obstruction: Secondary | ICD-10-CM | POA: Diagnosis not present

## 2022-07-05 DIAGNOSIS — Z950 Presence of cardiac pacemaker: Secondary | ICD-10-CM | POA: Insufficient documentation

## 2022-07-05 DIAGNOSIS — E039 Hypothyroidism, unspecified: Secondary | ICD-10-CM | POA: Diagnosis not present

## 2022-07-05 DIAGNOSIS — Z8673 Personal history of transient ischemic attack (TIA), and cerebral infarction without residual deficits: Secondary | ICD-10-CM | POA: Diagnosis not present

## 2022-07-05 DIAGNOSIS — N200 Calculus of kidney: Secondary | ICD-10-CM | POA: Diagnosis not present

## 2022-07-05 DIAGNOSIS — N201 Calculus of ureter: Secondary | ICD-10-CM | POA: Diagnosis not present

## 2022-07-05 DIAGNOSIS — K219 Gastro-esophageal reflux disease without esophagitis: Secondary | ICD-10-CM | POA: Insufficient documentation

## 2022-07-05 DIAGNOSIS — I10 Essential (primary) hypertension: Secondary | ICD-10-CM | POA: Diagnosis not present

## 2022-07-05 HISTORY — PX: CYSTOSCOPY/URETEROSCOPY/HOLMIUM LASER/STENT PLACEMENT: SHX6546

## 2022-07-05 SURGERY — CYSTOSCOPY/URETEROSCOPY/HOLMIUM LASER/STENT PLACEMENT
Anesthesia: General | Laterality: Left

## 2022-07-05 MED ORDER — FENTANYL CITRATE (PF) 100 MCG/2ML IJ SOLN
INTRAMUSCULAR | Status: DC | PRN
  Administered 2022-07-05: 50 ug via INTRAVENOUS

## 2022-07-05 MED ORDER — ONDANSETRON HCL 4 MG/2ML IJ SOLN
INTRAMUSCULAR | Status: AC
  Filled 2022-07-05: qty 2

## 2022-07-05 MED ORDER — ACETAMINOPHEN 500 MG PO TABS
ORAL_TABLET | ORAL | Status: AC
  Filled 2022-07-05: qty 2

## 2022-07-05 MED ORDER — PROPOFOL 10 MG/ML IV BOLUS
INTRAVENOUS | Status: AC
  Filled 2022-07-05: qty 20

## 2022-07-05 MED ORDER — FENTANYL CITRATE (PF) 100 MCG/2ML IJ SOLN
INTRAMUSCULAR | Status: AC
  Filled 2022-07-05: qty 2

## 2022-07-05 MED ORDER — FENTANYL CITRATE PF 50 MCG/ML IJ SOSY: 25.0000 ug | PREFILLED_SYRINGE | INTRAMUSCULAR | Status: DC | PRN

## 2022-07-05 MED ORDER — PROPOFOL 10 MG/ML IV BOLUS
INTRAVENOUS | Status: DC | PRN
  Administered 2022-07-05: 160 mg via INTRAVENOUS

## 2022-07-05 MED ORDER — DEXAMETHASONE SODIUM PHOSPHATE 10 MG/ML IJ SOLN
INTRAMUSCULAR | Status: AC
  Filled 2022-07-05: qty 1

## 2022-07-05 MED ORDER — ACETAMINOPHEN 500 MG PO TABS
1000.0000 mg | ORAL_TABLET | Freq: Once | ORAL | Status: AC
  Administered 2022-07-05: 1000 mg via ORAL

## 2022-07-05 MED ORDER — CEFAZOLIN SODIUM-DEXTROSE 2-4 GM/100ML-% IV SOLN
2.0000 g | INTRAVENOUS | Status: AC
  Administered 2022-07-05: 2 g via INTRAVENOUS
  Filled 2022-07-05: qty 100

## 2022-07-05 MED ORDER — SODIUM CHLORIDE 0.9 % IR SOLN
Status: DC | PRN
  Administered 2022-07-05: 3000 mL via INTRAVESICAL

## 2022-07-05 MED ORDER — LIDOCAINE 2% (20 MG/ML) 5 ML SYRINGE
INTRAMUSCULAR | Status: DC | PRN
  Administered 2022-07-05: 80 mg via INTRAVENOUS

## 2022-07-05 MED ORDER — AMISULPRIDE (ANTIEMETIC) 5 MG/2ML IV SOLN: 10.0000 mg | Freq: Once | INTRAVENOUS | Status: DC | PRN

## 2022-07-05 MED ORDER — MIDAZOLAM HCL 2 MG/2ML IJ SOLN
INTRAMUSCULAR | Status: AC
  Filled 2022-07-05: qty 2

## 2022-07-05 MED ORDER — MIDAZOLAM HCL 5 MG/5ML IJ SOLN
INTRAMUSCULAR | Status: DC | PRN
  Administered 2022-07-05: 2 mg via INTRAVENOUS

## 2022-07-05 MED ORDER — 0.9 % SODIUM CHLORIDE (POUR BTL) OPTIME
TOPICAL | Status: DC | PRN
  Administered 2022-07-05: 1000 mL

## 2022-07-05 MED ORDER — ONDANSETRON HCL 4 MG/2ML IJ SOLN
INTRAMUSCULAR | Status: DC | PRN
  Administered 2022-07-05: 4 mg via INTRAVENOUS

## 2022-07-05 MED ORDER — LACTATED RINGERS IV SOLN: INTRAVENOUS | Status: DC

## 2022-07-05 MED ORDER — DEXAMETHASONE SODIUM PHOSPHATE 10 MG/ML IJ SOLN
INTRAMUSCULAR | Status: DC | PRN
  Administered 2022-07-05: 10 mg via INTRAVENOUS

## 2022-07-05 MED ORDER — IOHEXOL 300 MG/ML  SOLN
INTRAMUSCULAR | Status: DC | PRN
  Administered 2022-07-05: 10 mL

## 2022-07-05 MED ORDER — CHLORHEXIDINE GLUCONATE 0.12 % MT SOLN
15.0000 mL | Freq: Once | OROMUCOSAL | Status: AC
  Administered 2022-07-05: 15 mL via OROMUCOSAL

## 2022-07-05 SURGICAL SUPPLY — 23 items
BAG URO CATCHER STRL LF (MISCELLANEOUS) ×1 IMPLANT
BASKET ZERO TIP NITINOL 2.4FR (BASKET) IMPLANT
BSKT STON RTRVL ZERO TP 2.4FR (BASKET) ×1
CATH URET 5FR 28IN CONE TIP (BALLOONS)
CATH URET 5FR 70CM CONE TIP (BALLOONS) IMPLANT
CATH URETL OPEN END 6FR 70 (CATHETERS) ×1 IMPLANT
CLOTH BEACON ORANGE TIMEOUT ST (SAFETY) ×1 IMPLANT
GLOVE BIO SURGEON STRL SZ7.5 (GLOVE) ×1 IMPLANT
GOWN STRL REUS W/ TWL XL LVL3 (GOWN DISPOSABLE) ×1 IMPLANT
GOWN STRL REUS W/TWL XL LVL3 (GOWN DISPOSABLE) ×1
GUIDEWIRE STR DUAL SENSOR (WIRE) ×1 IMPLANT
GUIDEWIRE ZIPWRE .038 STRAIGHT (WIRE) IMPLANT
KIT TURNOVER KIT A (KITS) IMPLANT
LASER FIB FLEXIVA PULSE ID 365 (Laser) IMPLANT
MANIFOLD NEPTUNE II (INSTRUMENTS) ×1 IMPLANT
PACK CYSTO (CUSTOM PROCEDURE TRAY) ×1 IMPLANT
SHEATH NAVIGATOR HD 11/13X28 (SHEATH) IMPLANT
SHEATH NAVIGATOR HD 11/13X36 (SHEATH) IMPLANT
STENT URET 6FRX24 CONTOUR (STENTS) IMPLANT
TRACTIP FLEXIVA PULS ID 200XHI (Laser) IMPLANT
TRACTIP FLEXIVA PULSE ID 200 (Laser) ×1
TUBING CONNECTING 10 (TUBING) ×1 IMPLANT
TUBING UROLOGY SET (TUBING) ×1 IMPLANT

## 2022-07-05 NOTE — Transfer of Care (Signed)
Immediate Anesthesia Transfer of Care Note  Patient: Carmen [name]  Procedure(s) Performed: CYSTOSCOPY LEFT RETROGRADE PYELOGRAM URETEROSCOPY/HOLMIUM LASER/STENT PLACEMENT (Left)  Patient Location: PACU  Anesthesia Type:General  Level of Consciousness: awake, alert , and oriented  Airway & Oxygen Therapy: Patient Spontanous Breathing and Patient connected to face mask oxygen  Post-op Assessment: Report given to RN and Post -op Vital signs reviewed and stable  Post vital signs: Reviewed and stable  Last Vitals:  Vitals Value Taken Time  BP 121/74 07/05/22 1715  Temp    Pulse 84 07/05/22 1715  Resp 11 07/05/22 1715  SpO2 100 % 07/05/22 1715  Vitals shown include unvalidated device data.  Last Pain:  Vitals:   07/05/22 1715  TempSrc:   PainSc: 0-No pain         Complications: No notable events documented.

## 2022-07-05 NOTE — Anesthesia Procedure Notes (Signed)
Procedure Name: LMA Insertion Date/Time: 07/05/2022 4:24 PM  Performed by: Gean Maidens, CRNAPre-anesthesia Checklist: Patient identified, Emergency Drugs available, Suction available, Patient being monitored and Timeout performed Patient Re-evaluated:Patient Re-evaluated prior to induction Oxygen Delivery Method: Circle system utilized Preoxygenation: Pre-oxygenation with 100% oxygen Induction Type: IV induction Ventilation: Mask ventilation without difficulty LMA: LMA inserted LMA Size: 4.0 Number of attempts: 1 Placement Confirmation: positive ETCO2 and breath sounds checked- equal and bilateral Tube secured with: Tape Dental Injury: Teeth and Oropharynx as per pre-operative assessment

## 2022-07-05 NOTE — Op Note (Signed)
Preoperative diagnosis: Left ureteral stone Postoperative diagnosis: Same  Procedure: Cystoscopy left retrograde pyelogram, left ureteroscopy laser lithotripsy, stone basket extraction and left ureteral stent placement  Surgeon: Junious Silk  Anesthesia: General  Indication for procedure: Carmen [name] is a 42 year old female with symptomatic left ureteral stone.  Tolerating stone passage and elected for ureteroscopy.  Findings: Left retrograde pyelogram-filling defect in the left distal ureter and proximal dilation consistent with the stone.  On ureteroscopy-stone noted at the left UVJ.  It was fragmented and the largest 3 fragments were removed and collected.  Description of procedure: After consent was obtained patient brought to the operating room.  After adequate anesthesia she was placed in lithotomy position and prepped and draped in the usual sterile fashion.  Timeout was performed to confirm the patient and procedure.  Cystoscope was passed per urethra and the left ureteral orifice cannulated with with a 5 [place] open-ended catheter and retrograde injection of contrast was performed.  A sensor wire was coiled in the upper calyx.  I then passed a single channel semirigid by the wire where the stone was located.  200 m laser fiber passed and at 0.8 and 10 and 0.2 and 60 the stone was fragmented and some of the pieces dusted.  The 3 largest pieces were removed with a 0 tip basket without difficulty and collected.  Final inspection of the ureter noted there to be no significant stone remaining.  Was able to inspect the distal ureter and look up toward the mid ureter.  Scope was backed out.  No ureteral injury.  Wire was backloaded on the cystoscope and a 6 x 24 cm stent advanced.  Wire was removed with a good coil seen in the kidney and a good coil in the bladder.  She was awakened taken recovery room in stable condition.  Complications: None  Blood loss: Minimal  Specimens to patient: Stone  fragments  Drains: 6 x 24 cm left ureteral stent with string  Disposition: Patient stable to PACU

## 2022-07-05 NOTE — Discharge Instructions (Addendum)
Okay to remove the stent in the morning on 07/06/2022 by pulling the string, take some ibuprofen before pulling the stent.  If you are tolerating the stent it would be better to pull it on Saturday morning, 07/07/2022.

## 2022-07-05 NOTE — Progress Notes (Signed)
Patient aware to arrive at 100 for surgery at 1530

## 2022-07-05 NOTE — Anesthesia Postprocedure Evaluation (Signed)
Anesthesia Post Note  Patient: Carmen [name]  Procedure(s) Performed: CYSTOSCOPY LEFT RETROGRADE PYELOGRAM URETEROSCOPY/HOLMIUM LASER/STENT PLACEMENT (Left)     Patient location during evaluation: PACU Anesthesia Type: General Level of consciousness: awake and alert Pain management: pain level controlled Vital Signs Assessment: post-procedure vital signs reviewed and stable Respiratory status: spontaneous breathing, nonlabored ventilation, respiratory function stable and patient connected to nasal cannula oxygen Cardiovascular status: blood pressure returned to baseline and stable Postop Assessment: no apparent nausea or vomiting Anesthetic complications: no  No notable events documented.  Last Vitals:  Vitals:   07/05/22 1730 07/05/22 1749  BP: (!) 142/91 (!) 130/100  Pulse: 80 89  Resp: 13 15  Temp:    SpO2: 98% 100%    Last Pain:  Vitals:   07/05/22 1749  TempSrc:   PainSc: 0-No pain                 Tiajuana Amass

## 2022-07-05 NOTE — Interval H&P Note (Signed)
History and Physical Interval Note:  07/05/2022 4:16 PM  Carmen [name]  has presented today for surgery, with the diagnosis of LEFT URETERAL STONE.  The various methods of treatment have been discussed with the patient and family. After consideration of risks, benefits and other options for treatment, the patient has consented to  Procedure(s): CYSTOSCOPY LEFT RETROGRADE PYELOGRAM URETEROSCOPY/HOLMIUM LASER/STENT PLACEMENT (Left) as a surgical intervention.  The patient's history has been reviewed, patient examined, no change in status, stable for surgery. She had some left distal pain and voided x 2. She wonders if she passed the stone. She has not seen a stone pass.  We got a KUB and the stone has migrated but still localizes left. We discussed could be about to pass or have passed in to the bladder. She elects to proceed with cysto, URS/HLL and stnet. We discussed the left prox stones and she said her main goal was to avoid a stent and have the least stent time. Therefore, since the stone is so distal, will simply remove it and NOT approach the kidney. We carefully reviewed her CT and KUB and she made this plan. I have reviewed the patient's chart and labs.  Questions were answered to the patient's satisfaction.  She has otherwise been well. No fever or dysuria.    Festus Aloe

## 2022-07-05 NOTE — Progress Notes (Signed)
Left message for Patient regarding arrival time for surgery today.

## 2022-07-05 NOTE — Anesthesia Preprocedure Evaluation (Signed)
Anesthesia Evaluation  Patient identified by MRN, date of birth, ID band Patient awake    Reviewed: Allergy & Precautions, NPO status , Patient's Chart, lab work & pertinent test results  Airway Mallampati: II  TM Distance: >3 FB Neck ROM: Full    Dental  (+) Dental Advisory Given   Pulmonary neg pulmonary ROS   breath sounds clear to auscultation       Cardiovascular hypertension, Pt. on medications and Pt. on home beta blockers + dysrhythmias + pacemaker  Rhythm:Regular Rate:Normal     Neuro/Psych  Headaches CVA    GI/Hepatic Neg liver ROS,GERD  Medicated,,  Endo/Other  Hypothyroidism    Renal/GU negative Renal ROS     Musculoskeletal   Abdominal   Peds  Hematology negative hematology ROS (+)   Anesthesia Other Findings   Reproductive/Obstetrics                             Anesthesia Physical Anesthesia Plan  ASA: 2  Anesthesia Plan: General   Post-op Pain Management: Tylenol PO (pre-op)*   Induction: Intravenous  PONV Risk Score and Plan: 3 and Dexamethasone, Ondansetron, Midazolam and Treatment may vary due to age or medical condition  Airway Management Planned: LMA  Additional Equipment: None  Intra-op Plan:   Post-operative Plan: Extubation in OR  Informed Consent: I have reviewed the patients History and Physical, chart, labs and discussed the procedure including the risks, benefits and alternatives for the proposed anesthesia with the patient or authorized representative who has indicated his/her understanding and acceptance.     Dental advisory given  Plan Discussed with: CRNA  Anesthesia Plan Comments:        Anesthesia Quick Evaluation

## 2022-07-06 ENCOUNTER — Encounter (HOSPITAL_COMMUNITY): Payer: Self-pay | Admitting: Urology

## 2022-07-12 ENCOUNTER — Ambulatory Visit: Payer: Medicaid Other | Admitting: Family Medicine

## 2022-07-12 ENCOUNTER — Other Ambulatory Visit (HOSPITAL_BASED_OUTPATIENT_CLINIC_OR_DEPARTMENT_OTHER): Payer: Self-pay

## 2022-07-13 DIAGNOSIS — R8271 Bacteriuria: Secondary | ICD-10-CM | POA: Diagnosis not present

## 2022-07-26 ENCOUNTER — Other Ambulatory Visit (HOSPITAL_BASED_OUTPATIENT_CLINIC_OR_DEPARTMENT_OTHER): Payer: Self-pay

## 2022-07-26 MED ORDER — ZEPBOUND 5 MG/0.5ML ~~LOC~~ SOAJ
5.0000 mg | SUBCUTANEOUS | 0 refills | Status: DC
  Filled 2022-07-26: qty 2, 28d supply, fill #0

## 2022-08-01 NOTE — Progress Notes (Signed)
[facility] [place] Phone: [phone] Subjective:   Rito Ehrlich, am serving as a scribe for Dr. Hulan Saas.  I'm seeing this patient by the request  of:  Danton Sewer, MD  CC: Shoulder pain and back pain follow-up  [id number]  05/31/2022 Has had more fatigue and will increase the Synthroid to 112 while she waits further evaluation with endocrinology   Patient given injections today.  Tolerated the procedure well.  Hopeful that this will make some improvement.  We discussed with patient about icing regimen and home exercises otherwise.  Continuing to work on Administrator.  Patient will use massage as the other thing.  Declined any type of medications at this moment.  Follow-up again in 6 to 8 weeks     Updated 08/03/2022 Carmen [name] is a 42 y.o. female coming in with complaint of shoulder pain feels okay. Patient would like to talk about restless legs because she has been on going issues with her right leg, states she constantly feels like she needs to stretch her leg out and if she stops doing it she will feel like it needs to move. The legs is not bouncing per say, but its almost like a cramp you would get if on a statin. Her back is doing alright "knows it is there"  Patient was put on the Vit D once weekly she had her labs drawn at her PCP wondering what she does now since it was good       Past Medical History:  Diagnosis Date   Back pain    Costochondritis 09/13/2010   GERD (gastroesophageal reflux disease)    Heart palpitations    long history of    Heartburn    History of chest pain    History of fatigue    History of kidney stones    History of migraine headaches    History of tilt table evaluation    IMPRESSION: Positive tilt table study with significant cardioinhibitory  response.   Hyperlipidemia    Hypertension    Hypothyroidism    Kidney stones     history of   migraine    Neurocardiogenic syncope    Palpitations    Pericarditis 09/13/2010   Presence of permanent cardiac pacemaker    PSVT (paroxysmal supraventricular tachycardia)    Right ureteral calculus    Sleeping difficulties    Stress    Stroke ([place]) 06/15/2019   no residual deficits, tpa given at time of stroke   Stroke [facility])    tia jan 2022   SVD (spontaneous vaginal delivery)    x 1   Syncope and collapse    neurocardiogenic syncopy started age 31   Wears glasses    Past Surgical History:  Procedure Laterality Date   ANTERIOR AND POSTERIOR REPAIR N/A 11/14/2017   Procedure: ANTERIOR (CYSTOCELE) AND POSTERIOR REPAIR (RECTOCELE);  Surgeon: Delsa Bern, MD;  Location: [facility];  Service: Gynecology;  Laterality: N/A;   BILATERAL SALPINGECTOMY Bilateral 11/14/2017   Procedure: BILATERAL SALPINGECTOMY;  Surgeon: Delsa Bern, MD;  Location: [facility];  Service: Gynecology;  Laterality: Bilateral;   BLADDER SUSPENSION Bilateral 11/14/2017   Procedure: TRANSVAGINAL TAPE (TVT) PROCEDURE;  Surgeon: Everett Graff, MD;  Location: [facility];  Service: Gynecology;  Laterality: Bilateral;   CHOLECYSTECTOMY  11/2007   Cholecystitis - Laparoscopic cholecystectomy with intraoperative  cholangiogram.   CYSTOSCOPY N/A 11/14/2017   Procedure: CYSTOSCOPY;  Surgeon: Everett Graff, MD;  Location: [facility];  Service: Gynecology;  Laterality: N/A;   CYSTOSCOPY WITH RETROGRADE PYELOGRAM, URETEROSCOPY AND STENT PLACEMENT Right 06/25/2019   Procedure: CYSTOSCOPY WITH RETROGRADE PYELOGRAM, AND RIGHT  STENT PLACEMENT;  Surgeon: Robley Fries, MD;  Location: [facility];  Service: Urology;  Laterality: Right;   CYSTOSCOPY WITH RETROGRADE PYELOGRAM, URETEROSCOPY AND STENT PLACEMENT Right 07/07/2019   Procedure: CYSTOSCOPY , URETEROSCOPY AND STENT REPLACEMENT;  Surgeon: Robley Fries, MD;  Location[facility];  Service: Urology;  Laterality: Right;   CYSTOSCOPY WITH RETROGRADE  PYELOGRAM, URETEROSCOPY AND STENT PLACEMENT Right 03/23/2021   Procedure: CYSTOSCOPY WITH RETROGRADE PYELOGRAM, URETEROSCOPY AND STENT PLACEMENT;  Surgeon: Franchot Gallo, MD;  Location: [facility];  Service: Urology;  Laterality: Right;   CYSTOSCOPY/URETEROSCOPY/HOLMIUM LASER/STENT PLACEMENT Left 07/05/2022   Procedure: CYSTOSCOPY LEFT RETROGRADE PYELOGRAM URETEROSCOPY/HOLMIUM LASER/STENT PLACEMENT;  Surgeon: Festus Aloe, MD;  Location: [facility];  Service: Urology;  Laterality: Left;   HOLMIUM LASER APPLICATION Right [id number]   Procedure: HOLMIUM LASER APPLICATION;  Surgeon: Robley Fries, MD;  Location: [facility];  Service: Urology;  Laterality: Right;   HOLMIUM LASER APPLICATION Right [id number]   Procedure: HOLMIUM LASER APPLICATION;  Surgeon: Franchot Gallo, MD;  Location: [facility];  Service: Urology;  Laterality: Right;   PACEMAKER INSERTION  2016   Tilt Table Study  07/17/2005   Positive tilt table study with significant cardioinhibitory  response.   VAGINAL HYSTERECTOMY N/A 11/14/2017   Procedure: HYSTERECTOMY VAGINAL;  Surgeon: Delsa Bern, MD;  Location: [facility];  Service: Gynecology;  Laterality: N/A;   WISDOM TOOTH EXTRACTION     age 36   Social History   Socioeconomic History   Marital status: Married    Spouse name: Not on file   Number of children: Not on file   Years of education: Not on file   Highest education level: Not on file  Occupational History   Occupation: mammography tech  Tobacco Use   Smoking status: Never   Smokeless tobacco: Never  Vaping Use   Vaping Use: Never used  Substance and Sexual Activity   Alcohol use: Yes    Comment: very rare   Drug use: Never   Sexual activity: Not on file  Other Topics Concern   Not on file  Social History Narrative   Not on file   Social Determinants of Health   Financial Resource Strain: Not on file  Food Insecurity: Not on file   Transportation Needs: Not on file  Physical Activity: Not on file  Stress: Not on file  Social Connections: Not on file   Allergies  Allergen Reactions   Tape Hives    Adhesive tape - tegaderm/paper tape ok   Prochlorperazine Other (See Comments)    Akathisia with compazine in ED on 05/17/19.  Resolved with repeat dose of benadryl.      Quinolones Nausea Only   Reglan [Metoclopramide] Other (See Comments)    flinging legs and "not feeling right".    Family History  Problem Relation Age of Onset   Hypertension Mother    Heart disease Mother    Cancer Mother        breast   Breast cancer Mother 29   Diabetes Mother    Hyperlipidemia Mother    Thyroid disease Mother    Depression Mother    Anxiety disorder Mother    Bipolar disorder Mother    Liver disease Mother  Alcoholism Mother    Obesity Mother    Heart attack Father    Hyperlipidemia Father    Hepatitis C Father    Diabetes Father    Hypertension Father    Heart disease Father    Sudden Cardiac Death Father    Depression Father    Anxiety disorder Father    Liver disease Father    Alcoholism Father    Drug abuse Father    Obesity Father    Cancer Maternal Aunt        Breast   Breast cancer Maternal Aunt 62   Breast cancer Maternal Grandmother 61    Current Outpatient Medications (Endocrine & Metabolic):    levothyroxine (SYNTHROID) 125 MCG tablet, Take 1 tablet (125 mcg total) by mouth daily.  Current Outpatient Medications (Cardiovascular):    hydrochlorothiazide (HYDRODIURIL) 25 MG tablet, Take 1 tablet (25 mg total) by mouth in the morning.   metoprolol succinate (TOPROL-XL) 100 MG 24 hr tablet, Take one tablet (100 mg dose) by mouth daily. (Patient taking differently: Take 100 mg by mouth at bedtime.)   metoprolol succinate (TOPROL-XL) 50 MG 24 hr tablet, Take 2 tablets (100 mg total) by mouth in the morning AND 1 tablet (50 mg total) every evening. (Patient taking differently: Take 50 mg in the  morning)  Current Outpatient Medications (Respiratory):    promethazine (PHENERGAN) 25 MG tablet, Take 1 tablet (25 mg total) by mouth every 6 (six) hours as needed for nausea or vomiting.  Current Outpatient Medications (Analgesics):    aspirin EC 81 MG tablet, Take 81 mg by mouth daily. Swallow whole.   meloxicam (MOBIC) 15 MG tablet, Take 1 tablet (15 mg total) by mouth daily.   SUMAtriptan (IMITREX) 100 MG tablet, Take 1 tablet by mouth once daily as needed for migraine. May repeat ONCE in 2 hours.   Current Outpatient Medications (Other):    busPIRone (BUSPAR) 10 MG tablet, Take 1 tablet (10 mg total) by mouth 2 (two) times daily.   clonazePAM (KLONOPIN) 0.5 MG tablet, Take 1 tablet (0.5 mg total) by mouth in the morning and at bedtime. (Patient taking differently: Take 0.5 mg by mouth daily as needed for anxiety (Sleep).)   cyclobenzaprine (FLEXERIL) 5 MG tablet, Take 1 tablet (5 mg total) by mouth at bedtime as needed for muscle spasms.   gabapentin (NEURONTIN) 100 MG capsule, Take 2 capsules (200 mg total) by mouth at bedtime.   QUEtiapine (SEROQUEL) 25 MG tablet, Take 1 tablet (25 mg total) by mouth at bedtime.   Semaglutide-Weight Management (WEGOVY) 1.7 MG/0.75ML SOAJ, Inject 1.7 mg into the skin once a week.   tirzepatide (ZEPBOUND) 2.5 MG/0.5ML Pen, Inject 2.5 mg into the skin once a week.   tirzepatide (ZEPBOUND) 5 MG/0.5ML Pen, Inject 5 mg into the skin once a week.   buPROPion (WELLBUTRIN XL) 150 MG 24 hr tablet, Take 1 tablet (150 mg total) by mouth in the morning.   Vitamin D, Ergocalciferol, (DRISDOL) 1.25 MG (50000 UNIT) CAPS capsule, Take 1 capsule (50,000 Units total) by mouth every 7 (seven) days.   Reviewed prior external information including notes and imaging from  primary care provider As well as notes that were available from care everywhere and other healthcare systems.  Past medical history, social, surgical and family history all reviewed in electronic  medical record.  No pertanent information unless stated regarding to the chief complaint.   Review of Systems:  No headache, visual changes, nausea, vomiting, diarrhea, constipation, dizziness,  abdominal pain, skin rash, fevers, chills, night sweats, weight loss, swollen lymph nodes, body aches, joint swelling, chest pain, shortness of breath, mood changes. POSITIVE muscle aches  Objective  Blood pressure 122/84, pulse 81, height 5' 2.5" (1.588 m), weight 180 lb (81.6 kg), SpO2 98 %.   General: No apparent distress alert and oriented x3 mood and affect normal, dressed appropriately.  HEENT: Pupils equal, extraocular movements intact  Respiratory: Patient's speak in full sentences and does not appear short of breath  Cardiovascular: No lower extremity edema, non tender, no erythema  Sitting comfortably on exam today.  Patient does have some mild tenderness in the shoulder regions.  Able to move the neck though relatively well.    Impression and Recommendations:     The above documentation has been reviewed and is accurate and complete Lyndal Pulley, DO

## 2022-08-02 DIAGNOSIS — E559 Vitamin D deficiency, unspecified: Secondary | ICD-10-CM | POA: Diagnosis not present

## 2022-08-02 DIAGNOSIS — N951 Menopausal and female climacteric states: Secondary | ICD-10-CM | POA: Diagnosis not present

## 2022-08-02 DIAGNOSIS — R61 Generalized hyperhidrosis: Secondary | ICD-10-CM | POA: Diagnosis not present

## 2022-08-03 ENCOUNTER — Other Ambulatory Visit (HOSPITAL_BASED_OUTPATIENT_CLINIC_OR_DEPARTMENT_OTHER): Payer: Self-pay

## 2022-08-03 ENCOUNTER — Ambulatory Visit (INDEPENDENT_AMBULATORY_CARE_PROVIDER_SITE_OTHER): Payer: Commercial Managed Care - PPO | Admitting: Family Medicine

## 2022-08-03 ENCOUNTER — Encounter: Payer: Self-pay | Admitting: Family Medicine

## 2022-08-03 VITALS — BP 122/84 | HR 81 | Ht 62.5 in | Wt 180.0 lb

## 2022-08-03 DIAGNOSIS — E559 Vitamin D deficiency, unspecified: Secondary | ICD-10-CM

## 2022-08-03 DIAGNOSIS — E669 Obesity, unspecified: Secondary | ICD-10-CM | POA: Diagnosis not present

## 2022-08-03 DIAGNOSIS — E039 Hypothyroidism, unspecified: Secondary | ICD-10-CM

## 2022-08-03 MED ORDER — BUPROPION HCL ER (XL) 150 MG PO TB24
150.0000 mg | ORAL_TABLET | Freq: Every morning | ORAL | 2 refills | Status: DC
  Filled 2022-08-03: qty 30, 30d supply, fill #0
  Filled 2022-08-29: qty 30, 30d supply, fill #1
  Filled 2022-10-03: qty 30, 30d supply, fill #2

## 2022-08-03 MED ORDER — LEVOTHYROXINE SODIUM 125 MCG PO TABS
125.0000 ug | ORAL_TABLET | Freq: Every day | ORAL | 6 refills | Status: DC
  Filled 2022-08-03: qty 30, 30d supply, fill #0
  Filled 2022-08-29: qty 30, 30d supply, fill #1
  Filled 2022-10-03: qty 30, 30d supply, fill #2
  Filled 2022-10-28: qty 30, 30d supply, fill #3
  Filled 2022-11-29: qty 30, 30d supply, fill #4

## 2022-08-03 MED ORDER — VITAMIN D (ERGOCALCIFEROL) 1.25 MG (50000 UNIT) PO CAPS
50000.0000 [IU] | ORAL_CAPSULE | ORAL | 0 refills | Status: DC
  Filled 2022-08-03: qty 12, 84d supply, fill #0

## 2022-08-03 NOTE — Assessment & Plan Note (Signed)
Discussed with patient, near normal with her vitamin D but would like.  Between 50-75  4-monthcourse.  Refill given today

## 2022-08-03 NOTE — Assessment & Plan Note (Signed)
Patient still having difficulty with her thyroid so we will increase Synthroid to 125 mcg.

## 2022-08-03 NOTE — Assessment & Plan Note (Signed)
Patient does have some obesity noted.  I will send to metabolic medicine to help her with further weight loss.  Do think will be beneficial.

## 2022-08-03 NOTE — Patient Instructions (Addendum)
Good to see you  Vit D refill sent  Synthroid bumped from 112-166m Follow up with Endocrinology  Referral to EBriscoe Deutscherplaced  Come back and get labs in three months Can make an appointment for 3 months if you would like

## 2022-08-08 ENCOUNTER — Other Ambulatory Visit: Payer: Self-pay

## 2022-08-08 ENCOUNTER — Other Ambulatory Visit (HOSPITAL_BASED_OUTPATIENT_CLINIC_OR_DEPARTMENT_OTHER): Payer: Self-pay

## 2022-08-08 DIAGNOSIS — I10 Essential (primary) hypertension: Secondary | ICD-10-CM | POA: Diagnosis not present

## 2022-08-08 DIAGNOSIS — G90A Postural orthostatic tachycardia syndrome (POTS): Secondary | ICD-10-CM | POA: Diagnosis not present

## 2022-08-08 DIAGNOSIS — Z950 Presence of cardiac pacemaker: Secondary | ICD-10-CM | POA: Diagnosis not present

## 2022-08-08 DIAGNOSIS — I6300 Cerebral infarction due to thrombosis of unspecified precerebral artery: Secondary | ICD-10-CM | POA: Diagnosis not present

## 2022-08-08 MED ORDER — METOPROLOL SUCCINATE ER 50 MG PO TB24
ORAL_TABLET | ORAL | 3 refills | Status: DC
  Filled 2022-08-08: qty 135, 45d supply, fill #0
  Filled 2022-10-03: qty 135, 45d supply, fill #1
  Filled 2022-11-19: qty 135, 45d supply, fill #2
  Filled 2023-01-24: qty 135, 45d supply, fill #3

## 2022-08-10 ENCOUNTER — Other Ambulatory Visit (HOSPITAL_BASED_OUTPATIENT_CLINIC_OR_DEPARTMENT_OTHER): Payer: Self-pay

## 2022-08-10 MED ORDER — ESZOPICLONE 2 MG PO TABS
2.0000 mg | ORAL_TABLET | Freq: Every day | ORAL | 1 refills | Status: DC
  Filled 2022-08-10: qty 30, 30d supply, fill #0
  Filled 2022-10-03: qty 30, 30d supply, fill #1

## 2022-08-20 DIAGNOSIS — H5202 Hypermetropia, left eye: Secondary | ICD-10-CM | POA: Diagnosis not present

## 2022-08-22 ENCOUNTER — Other Ambulatory Visit (HOSPITAL_BASED_OUTPATIENT_CLINIC_OR_DEPARTMENT_OTHER): Payer: Self-pay

## 2022-08-22 MED ORDER — ZEPBOUND 7.5 MG/0.5ML ~~LOC~~ SOAJ
7.5000 mg | SUBCUTANEOUS | 0 refills | Status: DC
  Filled 2022-08-22: qty 2, 28d supply, fill #0

## 2022-08-27 DIAGNOSIS — E039 Hypothyroidism, unspecified: Secondary | ICD-10-CM | POA: Diagnosis not present

## 2022-08-27 DIAGNOSIS — E063 Autoimmune thyroiditis: Secondary | ICD-10-CM | POA: Diagnosis not present

## 2022-08-29 ENCOUNTER — Other Ambulatory Visit (HOSPITAL_BASED_OUTPATIENT_CLINIC_OR_DEPARTMENT_OTHER): Payer: Self-pay

## 2022-08-29 MED ORDER — NURTEC 75 MG PO TBDP
75.0000 mg | ORAL_TABLET | Freq: Every day | ORAL | 3 refills | Status: DC | PRN
  Filled 2022-08-29: qty 8, 16d supply, fill #0
  Filled 2022-10-03: qty 8, 16d supply, fill #1
  Filled 2022-10-27: qty 8, 16d supply, fill #2
  Filled 2022-11-09: qty 8, 16d supply, fill #3

## 2022-08-30 ENCOUNTER — Other Ambulatory Visit (HOSPITAL_BASED_OUTPATIENT_CLINIC_OR_DEPARTMENT_OTHER): Payer: Self-pay

## 2022-08-30 MED ORDER — PANTOPRAZOLE SODIUM 40 MG PO TBEC
40.0000 mg | DELAYED_RELEASE_TABLET | Freq: Every day | ORAL | 5 refills | Status: DC
  Filled 2022-08-30: qty 30, 30d supply, fill #0
  Filled 2022-10-03: qty 30, 30d supply, fill #1
  Filled 2022-11-19: qty 30, 30d supply, fill #2
  Filled 2022-12-20: qty 30, 30d supply, fill #3
  Filled 2023-01-24: qty 30, 30d supply, fill #4
  Filled 2023-02-23: qty 30, 30d supply, fill #5

## 2022-08-31 DIAGNOSIS — Z780 Asymptomatic menopausal state: Secondary | ICD-10-CM | POA: Diagnosis not present

## 2022-09-04 ENCOUNTER — Other Ambulatory Visit (HOSPITAL_BASED_OUTPATIENT_CLINIC_OR_DEPARTMENT_OTHER): Payer: Self-pay

## 2022-09-06 ENCOUNTER — Other Ambulatory Visit (HOSPITAL_BASED_OUTPATIENT_CLINIC_OR_DEPARTMENT_OTHER): Payer: Self-pay

## 2022-09-06 DIAGNOSIS — N200 Calculus of kidney: Secondary | ICD-10-CM | POA: Diagnosis not present

## 2022-09-06 DIAGNOSIS — N3946 Mixed incontinence: Secondary | ICD-10-CM | POA: Diagnosis not present

## 2022-09-06 DIAGNOSIS — R1084 Generalized abdominal pain: Secondary | ICD-10-CM | POA: Diagnosis not present

## 2022-09-06 DIAGNOSIS — R8271 Bacteriuria: Secondary | ICD-10-CM | POA: Diagnosis not present

## 2022-09-06 MED ORDER — CEPHALEXIN 500 MG PO CAPS
500.0000 mg | ORAL_CAPSULE | Freq: Two times a day (BID) | ORAL | 0 refills | Status: DC
  Filled 2022-09-06: qty 14, 7d supply, fill #0

## 2022-09-18 ENCOUNTER — Other Ambulatory Visit (HOSPITAL_BASED_OUTPATIENT_CLINIC_OR_DEPARTMENT_OTHER): Payer: Self-pay

## 2022-09-18 MED ORDER — CLONAZEPAM 0.5 MG PO TABS
0.5000 mg | ORAL_TABLET | Freq: Two times a day (BID) | ORAL | 1 refills | Status: DC
  Filled 2022-09-18: qty 45, 23d supply, fill #0
  Filled 2022-10-24: qty 45, 23d supply, fill #1

## 2022-09-18 MED ORDER — ZEPBOUND 10 MG/0.5ML ~~LOC~~ SOAJ
10.0000 mg | SUBCUTANEOUS | 1 refills | Status: DC
  Filled 2022-09-18 (×3): qty 2, 28d supply, fill #0

## 2022-09-20 ENCOUNTER — Other Ambulatory Visit (HOSPITAL_BASED_OUTPATIENT_CLINIC_OR_DEPARTMENT_OTHER): Payer: Self-pay

## 2022-09-20 DIAGNOSIS — R8271 Bacteriuria: Secondary | ICD-10-CM | POA: Diagnosis not present

## 2022-09-20 DIAGNOSIS — N3000 Acute cystitis without hematuria: Secondary | ICD-10-CM | POA: Diagnosis not present

## 2022-09-20 DIAGNOSIS — N3946 Mixed incontinence: Secondary | ICD-10-CM | POA: Diagnosis not present

## 2022-09-20 MED ORDER — CIPROFLOXACIN HCL 500 MG PO TABS
500.0000 mg | ORAL_TABLET | Freq: Two times a day (BID) | ORAL | 0 refills | Status: DC
  Filled 2022-09-20: qty 14, 7d supply, fill #0

## 2022-09-26 ENCOUNTER — Other Ambulatory Visit (HOSPITAL_BASED_OUTPATIENT_CLINIC_OR_DEPARTMENT_OTHER): Payer: Self-pay

## 2022-09-26 DIAGNOSIS — E559 Vitamin D deficiency, unspecified: Secondary | ICD-10-CM | POA: Diagnosis not present

## 2022-09-26 DIAGNOSIS — E782 Mixed hyperlipidemia: Secondary | ICD-10-CM | POA: Diagnosis not present

## 2022-09-26 DIAGNOSIS — I10 Essential (primary) hypertension: Secondary | ICD-10-CM | POA: Diagnosis not present

## 2022-09-26 DIAGNOSIS — Z131 Encounter for screening for diabetes mellitus: Secondary | ICD-10-CM | POA: Diagnosis not present

## 2022-09-26 DIAGNOSIS — R635 Abnormal weight gain: Secondary | ICD-10-CM | POA: Diagnosis not present

## 2022-09-26 MED ORDER — ZEPBOUND 7.5 MG/0.5ML ~~LOC~~ SOAJ
7.5000 mg | SUBCUTANEOUS | 1 refills | Status: DC
  Filled 2022-09-26: qty 2, 28d supply, fill #0

## 2022-09-26 MED ORDER — ZEPBOUND 10 MG/0.5ML ~~LOC~~ SOAJ
SUBCUTANEOUS | 0 refills | Status: DC
  Filled 2022-09-26: qty 2, 30d supply, fill #0

## 2022-10-01 ENCOUNTER — Other Ambulatory Visit (HOSPITAL_BASED_OUTPATIENT_CLINIC_OR_DEPARTMENT_OTHER): Payer: Self-pay

## 2022-10-01 MED ORDER — BUPROPION HCL ER (XL) 300 MG PO TB24
300.0000 mg | ORAL_TABLET | Freq: Every morning | ORAL | 2 refills | Status: DC
  Filled 2022-10-01: qty 30, 30d supply, fill #0
  Filled 2022-10-28: qty 30, 30d supply, fill #1
  Filled 2022-11-29: qty 30, 30d supply, fill #2

## 2022-10-04 ENCOUNTER — Other Ambulatory Visit (HOSPITAL_BASED_OUTPATIENT_CLINIC_OR_DEPARTMENT_OTHER): Payer: Self-pay

## 2022-10-10 DIAGNOSIS — E782 Mixed hyperlipidemia: Secondary | ICD-10-CM | POA: Diagnosis not present

## 2022-10-10 DIAGNOSIS — E669 Obesity, unspecified: Secondary | ICD-10-CM | POA: Diagnosis not present

## 2022-10-10 DIAGNOSIS — E8889 Other specified metabolic disorders: Secondary | ICD-10-CM | POA: Diagnosis not present

## 2022-10-10 DIAGNOSIS — Z8673 Personal history of transient ischemic attack (TIA), and cerebral infarction without residual deficits: Secondary | ICD-10-CM | POA: Diagnosis not present

## 2022-10-10 DIAGNOSIS — Z6830 Body mass index (BMI) 30.0-30.9, adult: Secondary | ICD-10-CM | POA: Diagnosis not present

## 2022-10-10 DIAGNOSIS — E559 Vitamin D deficiency, unspecified: Secondary | ICD-10-CM | POA: Diagnosis not present

## 2022-10-10 DIAGNOSIS — I10 Essential (primary) hypertension: Secondary | ICD-10-CM | POA: Diagnosis not present

## 2022-10-10 DIAGNOSIS — E039 Hypothyroidism, unspecified: Secondary | ICD-10-CM | POA: Diagnosis not present

## 2022-10-24 ENCOUNTER — Other Ambulatory Visit (HOSPITAL_BASED_OUTPATIENT_CLINIC_OR_DEPARTMENT_OTHER): Payer: Self-pay

## 2022-10-24 MED ORDER — LOMAIRA 8 MG PO TABS
ORAL_TABLET | ORAL | 0 refills | Status: DC
  Filled 2022-10-24: qty 30, 30d supply, fill #0

## 2022-10-25 ENCOUNTER — Other Ambulatory Visit: Payer: Self-pay

## 2022-10-28 ENCOUNTER — Other Ambulatory Visit (HOSPITAL_BASED_OUTPATIENT_CLINIC_OR_DEPARTMENT_OTHER): Payer: Self-pay

## 2022-10-28 ENCOUNTER — Other Ambulatory Visit: Payer: Self-pay | Admitting: Family Medicine

## 2022-10-28 MED ORDER — VITAMIN D (ERGOCALCIFEROL) 1.25 MG (50000 UNIT) PO CAPS
50000.0000 [IU] | ORAL_CAPSULE | ORAL | 0 refills | Status: DC
  Filled 2022-10-28: qty 12, 84d supply, fill #0

## 2022-10-29 ENCOUNTER — Other Ambulatory Visit: Payer: Self-pay

## 2022-10-29 ENCOUNTER — Other Ambulatory Visit (HOSPITAL_BASED_OUTPATIENT_CLINIC_OR_DEPARTMENT_OTHER): Payer: Self-pay

## 2022-10-29 MED ORDER — ESZOPICLONE 2 MG PO TABS
2.0000 mg | ORAL_TABLET | Freq: Every day | ORAL | 1 refills | Status: DC
  Filled 2022-10-29 – 2022-11-01 (×2): qty 30, 30d supply, fill #0
  Filled 2022-11-29: qty 30, 30d supply, fill #1

## 2022-10-29 MED ORDER — HYDROCHLOROTHIAZIDE 25 MG PO TABS
25.0000 mg | ORAL_TABLET | Freq: Every morning | ORAL | 5 refills | Status: DC
  Filled 2022-10-29: qty 30, 30d supply, fill #0
  Filled 2022-11-29: qty 30, 30d supply, fill #1
  Filled 2022-12-20: qty 30, 30d supply, fill #2

## 2022-10-31 NOTE — Progress Notes (Unsigned)
[name] [address] [place] [address] Phone: [phone] Subjective:   Carmen [name], am serving as a scribe for Dr. Antoine Primas.  I'm seeing this patient by the request  of:  Marthenia Rolling, MD  CC: Neck pain follow-up  [id number]  08/03/2022 Discussed with patient, near normal with her vitamin D but would like.  Between 50-75   52-month course.  Refill given today  Patient still having difficulty with her thyroid so we will increase Synthroid to 125 mcg.   Patient does have some obesity noted.  I will send to metabolic medicine to help her with further weight loss.  Do think will be beneficial.     Updated 11/01/2022 Carmen [name] is a 42 y.o. female coming in with complaint of thoracic and cervical spine pain. Feels like she is in knots today. Denies any radicular symptoms.  Reviewing outside records patient did see endocrinology.  Was found to have a still difficulty with the T4.    Past Medical History:  Diagnosis Date   Back pain    Costochondritis 09/13/2010   GERD (gastroesophageal reflux disease)    Heart palpitations    long history of    Heartburn    History of chest pain    History of fatigue    History of kidney stones    History of migraine headaches    History of tilt table evaluation    IMPRESSION: Positive tilt table study with significant cardioinhibitory  response.   Hyperlipidemia    Hypertension    Hypothyroidism    Kidney stones    history of   migraine    Neurocardiogenic syncope    Palpitations    Pericarditis 09/13/2010   Presence of permanent cardiac pacemaker    PSVT (paroxysmal supraventricular tachycardia)    Right ureteral calculus    Sleeping difficulties    Stress    Stroke ([facility]) 06/15/2019   no residual deficits, tpa given at time of stroke   St[facility])    tia jan 2022   SVD (spontaneous vaginal delivery)    x 1   Syncope and collapse    neurocardiogenic syncopy started age  40   Wears glasses    Past Surgical History:  Procedure Laterality Date   ANTERIOR AND POSTERIOR REPAIR N/A 11/14/2017   Procedure: ANTERIOR (CYSTOCELE) AND POSTERIOR REPAIR (RECTOCELE);  Surgeon: Silverio Lay, MD;  Location: [facility];  Service: Gynecology;  Laterality: N/A;   BILATERAL SALPINGECTOMY Bilateral 11/14/2017   Procedure: BILATERAL SALPINGECTOMY;  Surgeon: Silverio Lay, MD;  Location: [facility];  Service: Gynecology;  Laterality: Bilateral;   BLADDER SUSPENSION Bilateral 11/14/2017   Procedure: TRANSVAGINAL TAPE (TVT) PROCEDURE;  Surgeon: Osborn Coho, MD;  Location: [facility];  Service: Gynecology;  Laterality: Bilateral;   CHOLECYSTECTOMY  11/2007   Cholecystitis - Laparoscopic cholecystectomy with intraoperative  cholangiogram.   CYSTOSCOPY N/A 11/14/2017   Procedure: CYSTOSCOPY;  Surgeon: Osborn Coho, MD;  Location: [facility];  Service: Gynecology;  Laterality: N/A;   CYSTOSCOPY WITH RETROGRADE PYELOGRAM, URETEROSCOPY AND STENT PLACEMENT Right 06/25/2019   Procedure: CYSTOSCOPY WITH RETROGRADE PYELOGRAM, AND RIGHT  STENT PLACEMENT;  Surgeon: Noel Christmas, MD;  Location: [facility];  Service: Urology;  Laterality: Right;   CYSTOSCOPY WITH RETROGRADE PYELOGRAM, URETEROSCOPY AND STENT PLACEMENT Right 07/07/2019   Procedure: CYSTOSCOPY , URETEROSCOPY AND STENT REPLACEMENT;  Surgeon: Noel Christmas, MD;  Location: [facility];  Service: Urology;  Laterality: Right;  CYSTOSCOPY WITH RETROGRADE PYELOGRAM, URETEROSCOPY AND STENT PLACEMENT Right 03/23/2021   Procedure: CYSTOSCOPY WITH RETROGRADE PYELOGRAM, URETEROSCOPY AND STENT PLACEMENT;  Surgeon: Marcine Matar, MD;  Location[facility];  Service: Urology;  Laterality: Right;   CYSTOSCOPY/URETEROSCOPY/HOLMIUM LASER/STENT PLACEMENT Left 07/05/2022   Procedure: CYSTOSCOPY LEFT RETROGRADE PYELOGRAM URETEROSCOPY/HOLMIUM LASER/STENT PLACEMENT;  Surgeon: Jerilee Field, MD;  Location: [facility];   Service: Urology;  Laterality: Left;   HOLMIUM LASER APPLICATION Right 07/07/2019   Procedure: HOLMIUM LASER APPLICATION;  Surgeon: Noel Christmas, MD;  Location: [facility];  Service: Urology;  Laterality: Right;   HOLMIUM LASER APPLICATION Right 03/23/2021   Procedure: HOLMIUM LASER APPLICATION;  Surgeon: Marcine Matar, MD;  [facility];  Service: Urology;  Laterality: Right;   PACEMAKER INSERTION  2016   Tilt Table Study  07/17/2005   Positive tilt table study with significant cardioinhibitory  response.   VAGINAL HYSTERECTOMY N/A 11/14/2017   Procedure: HYSTERECTOMY VAGINAL;  Surgeon: Silverio Lay, MD;  Location: [facility];  Service: Gynecology;  Laterality: N/A;   WISDOM TOOTH EXTRACTION     age 89   Social History   Socioeconomic History   Marital status: Married    Spouse name: Not on file   Number of children: Not on file   Years of education: Not on file   Highest education level: Not on file  Occupational History   Occupation: mammography tech  Tobacco Use   Smoking status: Never   Smokeless tobacco: Never  Vaping Use   Vaping Use: Never used  Substance and Sexual Activity   Alcohol use: Yes    Comment: very rare   Drug use: Never   Sexual activity: Not on file  Other Topics Concern   Not on file  Social History Narrative   Not on file   Social Determinants of Health   Financial Resource Strain: Not on file  Food Insecurity: Not on file  Transportation Needs: Not on file  Physical Activity: Not on file  Stress: Not on file  Social Connections: Not on file   Allergies  Allergen Reactions   Tape Hives    Adhesive tape - tegaderm/paper tape ok   Prochlorperazine Other (See Comments)    Akathisia with compazine in ED on 05/17/19.  Resolved with repeat dose of benadryl.      Quinolones Nausea Only   Reglan [Metoclopramide] Other (See Comments)    flinging legs and "not feeling right".    Family History   Problem Relation Age of Onset   Hypertension Mother    Heart disease Mother    Cancer Mother        breast   Breast cancer Mother 89   Diabetes Mother    Hyperlipidemia Mother    Thyroid disease Mother    Depression Mother    Anxiety disorder Mother    Bipolar disorder Mother    Liver disease Mother    Alcoholism Mother    Obesity Mother    Heart attack Father    Hyperlipidemia Father    Hepatitis C Father    Diabetes Father    Hypertension Father    Heart disease Father    Sudden Cardiac Death Father    Depression Father    Anxiety disorder Father    Liver disease Father    Alcoholism Father    Drug abuse Father    Obesity Father    Cancer Maternal Aunt        Breast  Breast cancer Maternal Aunt 47   Breast cancer Maternal Grandmother 68    Current Outpatient Medications (Endocrine & Metabolic):    levothyroxine (SYNTHROID) 125 MCG tablet, Take 1 tablet (125 mcg total) by mouth daily.  Current Outpatient Medications (Cardiovascular):    hydrochlorothiazide (HYDRODIURIL) 25 MG tablet, Take 1 tablet (25 mg total) by mouth in the morning.   metoprolol succinate (TOPROL-XL) 100 MG 24 hr tablet, Take one tablet (100 mg dose) by mouth daily. (Patient taking differently: Take 100 mg by mouth at bedtime.)   metoprolol succinate (TOPROL-XL) 50 MG 24 hr tablet, Take 2 tablets (100 mg total) by mouth in the morning AND 1 tablet (50 mg total) every evening. (Patient taking differently: Take 50 mg in the morning)   metoprolol succinate (TOPROL-XL) 50 MG 24 hr tablet, Take 1 tablet (50 mg total) by mouth every morning AND 2 tablets (100 mg total) Nightly.  Current Outpatient Medications (Respiratory):    promethazine (PHENERGAN) 25 MG tablet, Take 1 tablet (25 mg total) by mouth every 6 (six) hours as needed for nausea or vomiting.  Current Outpatient Medications (Analgesics):    aspirin EC 81 MG tablet, Take 81 mg by mouth daily. Swallow whole.   meloxicam (MOBIC) 15 MG tablet,  Take 1 tablet (15 mg total) by mouth daily.   Rimegepant Sulfate (NURTEC) 75 MG TBDP, Take 1 tablet (75 mg total) by mouth daily as needed.   SUMAtriptan (IMITREX) 100 MG tablet, Take 1 tablet by mouth once daily as needed for migraine. May repeat ONCE in 2 hours.   Current Outpatient Medications (Other):    buPROPion (WELLBUTRIN XL) 150 MG 24 hr tablet, Take 1 tablet (150 mg total) by mouth in the morning.   buPROPion (WELLBUTRIN XL) 300 MG 24 hr tablet, Take 1 tablet (300 mg total) by mouth every morning.   cephALEXin (KEFLEX) 500 MG capsule, Take 1 capsule (500 mg total) by mouth 2 (two) times daily.   ciprofloxacin (CIPRO) 500 MG tablet, Take 1 tablet (500 mg total) by mouth 2 (two) times daily.   clonazePAM (KLONOPIN) 0.5 MG tablet, Take 1 tablet (0.5 mg total) by mouth 2 (two) times daily. Max Daily Amount: 1 mg (2 tablets).   cyclobenzaprine (FLEXERIL) 5 MG tablet, Take 1 tablet (5 mg total) by mouth at bedtime as needed for muscle spasms.   eszopiclone (LUNESTA) 2 MG TABS tablet, Take 1 tablet (2 mg total) by mouth immediately before bedtime.   gabapentin (NEURONTIN) 100 MG capsule, Take 2 capsules (200 mg total) by mouth at bedtime.   pantoprazole (PROTONIX) 40 MG tablet, Take 1 tablet (40 mg total) by mouth daily.   Phentermine HCl (LOMAIRA) 8 MG TABS, Take 1 tablet by mouth 30 minutes before breakfast once a day   QUEtiapine (SEROQUEL) 25 MG tablet, Take 1 tablet (25 mg total) by mouth at bedtime.   Semaglutide-Weight Management (WEGOVY) 1.7 MG/0.75ML SOAJ, Inject 1.7 mg into the skin once a week.   tirzepatide (ZEPBOUND) 10 MG/0.5ML Pen, Inject 10 mg into the skin once a week.   tirzepatide (ZEPBOUND) 10 MG/0.5ML Pen, Inject 10mg  under the skin once a week 30 days   tirzepatide (ZEPBOUND) 2.5 MG/0.5ML Pen, Inject 2.5 mg into the skin once a week.   tirzepatide (ZEPBOUND) 5 MG/0.5ML Pen, Inject 5 mg into the skin once a week.   Vitamin D, Ergocalciferol, (DRISDOL) 1.25 MG (50000  UNIT) CAPS capsule, Take 1 capsule (50,000 Units total) by mouth every 7 (seven) days.  Reviewed prior external information including notes and imaging from  primary care provider As well as notes that were available from care everywhere and other healthcare systems.  Past medical history, social, surgical and family history all reviewed in electronic medical record.  No pertanent information unless stated regarding to the chief complaint.   Review of Systems:  No headache, visual changes, nausea, vomiting, diarrhea, constipation, dizziness, abdominal pain, skin rash, fevers, chills, night sweats, weight loss, swollen lymph nodes, body aches, joint swelling, chest pain, shortness of breath, mood changes. POSITIVE muscle aches  Objective  Blood pressure 110/82, pulse 82, height 5\' 2"  (1.575 m), weight 176 lb (79.8 kg), SpO2 97 %.   General: No apparent distress alert and oriented x3 mood and affect normal, dressed appropriately.  HEENT: Pupils equal, extraocular movements intact  Respiratory: Patient's speak in full sentences and does not appear short of breath  Cardiovascular: No lower extremity edema, non tender, no erythema  Neck exam still has some limited sidebending bilaterally.  Severe tightness still noted in the left side of the parascapular area.  Multiple trigger points noted in the trapezius, rhomboid, latissimus dorsi and lateral scapula.  After verbal consent patient was prepped with alcohol swab and with a 25-gauge needle injected in 4 distinct trigger points in the musculature of the left shoulder girdle.  This includes the rhomboid, trapezius, levator scapula and latissimus dorsi.  Total of 3 cc of 0.5% Marcaine and 1 cc of Kenalog 40 mg/mL used.  No blood loss.  Band-Aid placed postinjection instructions given    Impression and Recommendations:    The above documentation has been reviewed and is accurate and complete [name], DO

## 2022-11-01 ENCOUNTER — Other Ambulatory Visit: Payer: Self-pay

## 2022-11-01 ENCOUNTER — Other Ambulatory Visit (HOSPITAL_BASED_OUTPATIENT_CLINIC_OR_DEPARTMENT_OTHER): Payer: Self-pay

## 2022-11-01 ENCOUNTER — Ambulatory Visit: Payer: Commercial Managed Care - PPO | Admitting: Family Medicine

## 2022-11-01 ENCOUNTER — Encounter: Payer: Self-pay | Admitting: Family Medicine

## 2022-11-01 VITALS — BP 110/82 | HR 82 | Ht 62.0 in | Wt 176.0 lb

## 2022-11-01 DIAGNOSIS — M25512 Pain in left shoulder: Secondary | ICD-10-CM | POA: Diagnosis not present

## 2022-11-01 DIAGNOSIS — G4700 Insomnia, unspecified: Secondary | ICD-10-CM | POA: Diagnosis not present

## 2022-11-01 DIAGNOSIS — R61 Generalized hyperhidrosis: Secondary | ICD-10-CM | POA: Diagnosis not present

## 2022-11-01 DIAGNOSIS — E669 Obesity, unspecified: Secondary | ICD-10-CM | POA: Diagnosis not present

## 2022-11-01 DIAGNOSIS — N951 Menopausal and female climacteric states: Secondary | ICD-10-CM | POA: Diagnosis not present

## 2022-11-01 DIAGNOSIS — Z1331 Encounter for screening for depression: Secondary | ICD-10-CM | POA: Diagnosis not present

## 2022-11-01 NOTE — Assessment & Plan Note (Signed)
Repeat injection given again today.  Tolerated very previously very well for 6 months and hopefully this will make some improvement again.  We did do injections in the trapezius, rhomboid, latissimus dorsi and levator scapula.  Discussed which activities to do and which ones to avoid.  Patient will continue to monitor.  Patient has had multiple different muscle relaxers including cyclobenzaprine.  Follow-up again in 2 months

## 2022-11-01 NOTE — Patient Instructions (Addendum)
Trigger point injections today

## 2022-11-09 ENCOUNTER — Other Ambulatory Visit (HOSPITAL_BASED_OUTPATIENT_CLINIC_OR_DEPARTMENT_OTHER): Payer: Self-pay

## 2022-11-09 MED ORDER — BUSPIRONE HCL 10 MG PO TABS
ORAL_TABLET | ORAL | 0 refills | Status: DC
  Filled 2022-11-09: qty 60, 30d supply, fill #0

## 2022-11-12 ENCOUNTER — Other Ambulatory Visit (HOSPITAL_BASED_OUTPATIENT_CLINIC_OR_DEPARTMENT_OTHER): Payer: Self-pay

## 2022-11-12 MED ORDER — METFORMIN HCL ER 500 MG PO TB24
500.0000 mg | ORAL_TABLET | Freq: Every day | ORAL | 0 refills | Status: DC
  Filled 2022-11-12: qty 30, 30d supply, fill #0

## 2022-11-16 ENCOUNTER — Other Ambulatory Visit (HOSPITAL_BASED_OUTPATIENT_CLINIC_OR_DEPARTMENT_OTHER): Payer: Self-pay

## 2022-11-16 MED ORDER — DESVENLAFAXINE SUCCINATE ER 50 MG PO TB24
50.0000 mg | ORAL_TABLET | Freq: Every day | ORAL | 2 refills | Status: DC
  Filled 2022-11-16: qty 30, 30d supply, fill #0

## 2022-11-19 ENCOUNTER — Other Ambulatory Visit (HOSPITAL_BASED_OUTPATIENT_CLINIC_OR_DEPARTMENT_OTHER): Payer: Self-pay

## 2022-11-20 ENCOUNTER — Encounter: Payer: Self-pay | Admitting: Family Medicine

## 2022-11-20 ENCOUNTER — Other Ambulatory Visit (HOSPITAL_BASED_OUTPATIENT_CLINIC_OR_DEPARTMENT_OTHER): Payer: Self-pay

## 2022-11-20 ENCOUNTER — Other Ambulatory Visit: Payer: Self-pay

## 2022-11-20 MED ORDER — CLONAZEPAM 0.5 MG PO TABS
0.5000 mg | ORAL_TABLET | Freq: Two times a day (BID) | ORAL | 1 refills | Status: DC
  Filled 2022-11-20: qty 45, 23d supply, fill #0
  Filled 2022-12-20: qty 45, 23d supply, fill #1

## 2022-11-23 DIAGNOSIS — R310 Gross hematuria: Secondary | ICD-10-CM | POA: Diagnosis not present

## 2022-11-23 DIAGNOSIS — N200 Calculus of kidney: Secondary | ICD-10-CM | POA: Diagnosis not present

## 2022-11-27 ENCOUNTER — Other Ambulatory Visit (HOSPITAL_BASED_OUTPATIENT_CLINIC_OR_DEPARTMENT_OTHER): Payer: Self-pay

## 2022-11-27 DIAGNOSIS — E559 Vitamin D deficiency, unspecified: Secondary | ICD-10-CM | POA: Diagnosis not present

## 2022-11-27 DIAGNOSIS — E669 Obesity, unspecified: Secondary | ICD-10-CM | POA: Diagnosis not present

## 2022-11-27 DIAGNOSIS — E782 Mixed hyperlipidemia: Secondary | ICD-10-CM | POA: Diagnosis not present

## 2022-11-27 DIAGNOSIS — I10 Essential (primary) hypertension: Secondary | ICD-10-CM | POA: Diagnosis not present

## 2022-11-27 DIAGNOSIS — Z6830 Body mass index (BMI) 30.0-30.9, adult: Secondary | ICD-10-CM | POA: Diagnosis not present

## 2022-11-27 MED ORDER — METFORMIN HCL ER 500 MG PO TB24
500.0000 mg | ORAL_TABLET | Freq: Every evening | ORAL | 2 refills | Status: DC
  Filled 2022-11-27 – 2022-12-20 (×3): qty 30, 30d supply, fill #0
  Filled 2023-01-24: qty 30, 30d supply, fill #1
  Filled 2023-02-23: qty 30, 30d supply, fill #2

## 2022-11-29 ENCOUNTER — Other Ambulatory Visit (HOSPITAL_BASED_OUTPATIENT_CLINIC_OR_DEPARTMENT_OTHER): Payer: Self-pay

## 2022-11-29 ENCOUNTER — Other Ambulatory Visit (HOSPITAL_COMMUNITY): Payer: Self-pay | Admitting: Adult Health

## 2022-11-29 ENCOUNTER — Other Ambulatory Visit: Payer: Self-pay

## 2022-11-29 DIAGNOSIS — R310 Gross hematuria: Secondary | ICD-10-CM

## 2022-11-29 DIAGNOSIS — R311 Benign essential microscopic hematuria: Secondary | ICD-10-CM | POA: Diagnosis not present

## 2022-11-29 MED ORDER — URIBEL 81.6 MG PO TABS
81.6000 mg | ORAL_TABLET | Freq: Three times a day (TID) | ORAL | 1 refills | Status: DC
  Filled 2022-11-29: qty 30, 10d supply, fill #0

## 2022-11-29 MED ORDER — NURTEC 75 MG PO TBDP
75.0000 mg | ORAL_TABLET | Freq: Every day | ORAL | 3 refills | Status: DC | PRN
  Filled 2022-11-29: qty 8, 16d supply, fill #0
  Filled 2022-12-20: qty 8, 16d supply, fill #1
  Filled 2023-02-23: qty 8, 16d supply, fill #2
  Filled 2023-03-28: qty 8, 16d supply, fill #3

## 2022-11-30 ENCOUNTER — Other Ambulatory Visit (HOSPITAL_BASED_OUTPATIENT_CLINIC_OR_DEPARTMENT_OTHER): Payer: Self-pay

## 2022-12-03 ENCOUNTER — Ambulatory Visit (HOSPITAL_BASED_OUTPATIENT_CLINIC_OR_DEPARTMENT_OTHER)
Admission: RE | Admit: 2022-12-03 | Discharge: 2022-12-03 | Disposition: A | Discharge status: Home / Self Care | Payer: Commercial Managed Care - PPO | Source: Ambulatory Visit | Attending: Adult Health | Admitting: Adult Health

## 2022-12-03 ENCOUNTER — Encounter (HOSPITAL_BASED_OUTPATIENT_CLINIC_OR_DEPARTMENT_OTHER): Payer: Self-pay

## 2022-12-03 ENCOUNTER — Other Ambulatory Visit (HOSPITAL_BASED_OUTPATIENT_CLINIC_OR_DEPARTMENT_OTHER): Payer: Self-pay

## 2022-12-03 DIAGNOSIS — R310 Gross hematuria: Secondary | ICD-10-CM | POA: Insufficient documentation

## 2022-12-03 DIAGNOSIS — R319 Hematuria, unspecified: Secondary | ICD-10-CM | POA: Diagnosis not present

## 2022-12-03 MED ORDER — NITROFURANTOIN MONOHYD MACRO 100 MG PO CAPS
100.0000 mg | ORAL_CAPSULE | Freq: Two times a day (BID) | ORAL | 0 refills | Status: DC
  Filled 2022-12-03: qty 14, 7d supply, fill #0

## 2022-12-03 MED ORDER — IOHEXOL 350 MG/ML SOLN
100.0000 mL | Freq: Once | INTRAVENOUS | Status: AC | PRN
  Administered 2022-12-03: 100 mL via INTRAVENOUS

## 2022-12-06 ENCOUNTER — Other Ambulatory Visit (HOSPITAL_BASED_OUTPATIENT_CLINIC_OR_DEPARTMENT_OTHER): Payer: Self-pay

## 2022-12-17 ENCOUNTER — Other Ambulatory Visit (HOSPITAL_BASED_OUTPATIENT_CLINIC_OR_DEPARTMENT_OTHER): Payer: Self-pay

## 2022-12-20 ENCOUNTER — Other Ambulatory Visit (HOSPITAL_BASED_OUTPATIENT_CLINIC_OR_DEPARTMENT_OTHER): Payer: Self-pay

## 2022-12-20 ENCOUNTER — Other Ambulatory Visit: Payer: Self-pay

## 2022-12-20 MED ORDER — BUSPIRONE HCL 10 MG PO TABS
20.0000 mg | ORAL_TABLET | Freq: Every day | ORAL | 1 refills | Status: DC
  Filled 2022-12-20 – 2023-06-10 (×2): qty 60, 30d supply, fill #0

## 2022-12-20 MED ORDER — BUPROPION HCL ER (XL) 300 MG PO TB24
300.0000 mg | ORAL_TABLET | Freq: Every morning | ORAL | 2 refills | Status: DC
  Filled 2022-12-31: qty 30, 30d supply, fill #0
  Filled 2023-02-23: qty 30, 30d supply, fill #1
  Filled 2023-03-28: qty 30, 30d supply, fill #2

## 2022-12-20 MED ORDER — ESZOPICLONE 2 MG PO TABS
2.0000 mg | ORAL_TABLET | Freq: Every day | ORAL | 2 refills | Status: DC
  Filled 2022-12-31: qty 30, 30d supply, fill #0
  Filled 2023-02-23: qty 30, 30d supply, fill #1
  Filled 2023-03-28: qty 30, 30d supply, fill #2

## 2022-12-21 ENCOUNTER — Other Ambulatory Visit (HOSPITAL_BASED_OUTPATIENT_CLINIC_OR_DEPARTMENT_OTHER): Payer: Self-pay

## 2022-12-21 MED ORDER — BUSPIRONE HCL 10 MG PO TABS
20.0000 mg | ORAL_TABLET | Freq: Two times a day (BID) | ORAL | 2 refills | Status: DC
  Filled 2022-12-21: qty 120, 30d supply, fill #0
  Filled 2023-01-24: qty 120, 30d supply, fill #1
  Filled 2023-04-25: qty 120, 30d supply, fill #2

## 2022-12-23 ENCOUNTER — Emergency Department (HOSPITAL_BASED_OUTPATIENT_CLINIC_OR_DEPARTMENT_OTHER): Payer: Commercial Managed Care - PPO

## 2022-12-23 ENCOUNTER — Emergency Department (HOSPITAL_BASED_OUTPATIENT_CLINIC_OR_DEPARTMENT_OTHER)
Admission: EM | Admit: 2022-12-23 | Discharge: 2022-12-24 | Disposition: A | Discharge status: Home / Self Care | Payer: Commercial Managed Care - PPO | Attending: Emergency Medicine | Admitting: Emergency Medicine

## 2022-12-23 ENCOUNTER — Other Ambulatory Visit: Payer: Self-pay

## 2022-12-23 ENCOUNTER — Encounter (HOSPITAL_BASED_OUTPATIENT_CLINIC_OR_DEPARTMENT_OTHER): Payer: Self-pay

## 2022-12-23 DIAGNOSIS — R55 Syncope and collapse: Secondary | ICD-10-CM | POA: Diagnosis not present

## 2022-12-23 DIAGNOSIS — Z7982 Long term (current) use of aspirin: Secondary | ICD-10-CM | POA: Insufficient documentation

## 2022-12-23 DIAGNOSIS — S0003XA Contusion of scalp, initial encounter: Secondary | ICD-10-CM

## 2022-12-23 DIAGNOSIS — Z950 Presence of cardiac pacemaker: Secondary | ICD-10-CM | POA: Insufficient documentation

## 2022-12-23 DIAGNOSIS — W228XXA Striking against or struck by other objects, initial encounter: Secondary | ICD-10-CM | POA: Diagnosis not present

## 2022-12-23 DIAGNOSIS — S0990XA Unspecified injury of head, initial encounter: Secondary | ICD-10-CM | POA: Diagnosis not present

## 2022-12-23 LAB — BASIC METABOLIC PANEL
Anion gap: 12 (ref 5–15)
BUN: 10 mg/dL (ref 6–20)
CO2: 22 mmol/L (ref 22–32)
Calcium: 9.3 mg/dL (ref 8.9–10.3)
Chloride: 104 mmol/L (ref 98–111)
Creatinine, Ser: 0.87 mg/dL (ref 0.44–1.00)
GFR, Estimated: >60 mL/min (ref >60)
Glucose, Bld: 119 mg/dL — ABNORMAL HIGH (ref 70–99)
Potassium: 3.4 mmol/L — ABNORMAL LOW (ref 3.5–5.1)
Sodium: 138 mmol/L (ref 135–145)

## 2022-12-23 LAB — CBC
HCT: 42.7 % (ref 36.0–46.0)
Hemoglobin: 14.8 g/dL (ref 12.0–15.0)
MCH: 31 pg (ref 26.0–34.0)
MCHC: 34.7 g/dL (ref 30.0–36.0)
MCV: 89.5 fL (ref 80.0–100.0)
Platelets: 279 10*3/uL (ref 150–400)
RBC: 4.77 MIL/uL (ref 3.87–5.11)
RDW: 12.8 % (ref 11.5–15.5)
WBC: 7.3 10*3/uL (ref 4.0–10.5)
nRBC: 0 % (ref 0.0–0.2)

## 2022-12-23 LAB — TROPONIN I (HIGH SENSITIVITY)
Troponin I (High Sensitivity): <2 ng/L (ref <18)
Troponin I (High Sensitivity): <2 ng/L (ref <18)

## 2022-12-23 MED ORDER — LACTATED RINGERS IV BOLUS
1000.0000 mL | Freq: Once | INTRAVENOUS | Status: AC
  Administered 2022-12-23: 1000 mL via INTRAVENOUS

## 2022-12-23 NOTE — ED Triage Notes (Signed)
Pt arrives with c/o syncopal episode about 30 minutes ago. Pt did hit her head. Pt takes aspirin. Pt does have a pacemaker. Per pt, her HR has been intermittently been in the 60s and her pacemaker is set at 70. Pt also endorses low BPs. Pt denies CP or SOB.

## 2022-12-23 NOTE — ED Notes (Signed)
Notified Christian PA of BP

## 2022-12-23 NOTE — ED Provider Notes (Signed)
[place] [facility] Provider Note   CSN: [id number] Arrival date & time: 12/23/22  2118     History {Add pertinent medical, surgical, social history, OB history to HPI:1} Chief Complaint  Patient presents with   Loss of Consciousness    Carmen [name] is a 42 y.o. female.  The history is provided by the patient, the spouse and medical records.  Loss of Consciousness Carmen [name] is a 42 y.o. female who presents to the Emergency Department complaining of *** Yesterday, lethargic, lightheaded, tired. Interrogation  [place] in the bathroom and hit head.  Has uri sxs sore throat, fever 101 like a head cold.  No n/v/d.   No chest pain.  Has sob due to anxiety. No ab. No dysuria.   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 No hx/o blood clots. No recent meds.  S/p hyst.      Home Medications Prior to Admission medications   Medication Sig Start Date End Date Taking? Authorizing Provider  aspirin EC 81 MG tablet Take 81 mg by mouth daily. Swallow whole.    [provider]  buPROPion (WELLBUTRIN XL) 150 MG 24 hr tablet Take 1 tablet (150 mg total) by mouth in the morning. 08/03/22     buPROPion (WELLBUTRIN XL) 300 MG 24 hr tablet Take 1 tablet (300 mg total) by mouth every morning. 12/20/22     busPIRone (BUSPAR) 10 MG tablet Take 2 tablets (20 mg total) by mouth at bedtime. 12/20/22     busPIRone (BUSPAR) 10 MG tablet Take two tablets (20 mg dose) by mouth 2 (two) times daily. 12/21/22     cephALEXin (KEFLEX) 500 MG capsule Take 1 capsule (500 mg total) by mouth 2 (two) times daily. 09/06/22     ciprofloxacin (CIPRO) 500 MG tablet Take 1 tablet (500 mg total) by mouth 2 (two) times daily. 09/20/22     clonazePAM (KLONOPIN) 0.5 MG tablet Take 1 tablet (0.5 mg total) by mouth 2 (two) times daily. Max Daily Amount: 1 mg (2 tablets). 11/20/22     cyclobenzaprine (FLEXERIL) 5 MG tablet Take 1 tablet (5 mg total) by  mouth at bedtime as needed for muscle spasms. 05/21/22   [name], DO  desvenlafaxine (PRISTIQ) 50 MG 24 hr tablet Take 1 tablet (50 mg total) by mouth daily. 11/16/22     eszopiclone (LUNESTA) 2 MG TABS tablet Take 1 tablet (2 mg total) by mouth immediately before bedtime. 12/20/22     gabapentin (NEURONTIN) 100 MG capsule Take 2 capsules (200 mg total) by mouth at bedtime. 04/12/22   [name], DO  hydrochlorothiazide (HYDRODIURIL) 25 MG tablet Take 1 tablet (25 mg total) by mouth in the morning. 10/29/22     levothyroxine (SYNTHROID) 125 MCG tablet Take 1 tablet (125 mcg total) by mouth daily. 08/03/22   [name], DO  meloxicam (MOBIC) 15 MG tablet Take 1 tablet (15 mg total) by mouth daily. 05/21/22   [name], DO  metFORMIN (GLUCOPHAGE-XR) 500 MG 24 hr tablet Take 1 tablet (500 mg total) by mouth every evening with meal 11/27/22     metoprolol succinate (TOPROL-XL) 100 MG 24 hr tablet Take one tablet (100 mg dose) by mouth daily. Patient taking differently: Take 100 mg by mouth at bedtime. 04/02/22     metoprolol succinate (TOPROL-XL) 50 MG 24 hr tablet Take 2 tablets (100 mg total) by mouth in the morning AND 1 tablet (50 mg  total) every evening. Patient taking differently: Take 50 mg in the morning 12/06/21     metoprolol succinate (TOPROL-XL) 50 MG 24 hr tablet Take 1 tablet (50 mg total) by mouth every morning AND 2 tablets (100 mg total) Nightly. 08/08/22     nitrofurantoin, macrocrystal-monohydrate, (MACROBID) 100 MG capsule Take 1 capsule (100 mg total) by mouth 2 (two) times daily. 12/03/22     pantoprazole (PROTONIX) 40 MG tablet Take 1 tablet (40 mg total) by mouth daily. 08/30/22     Phentermine HCl (LOMAIRA) 8 MG TABS Take 1 tablet by mouth 30 minutes before breakfast once a day 10/24/22     promethazine (PHENERGAN) 25 MG tablet Take 1 tablet (25 mg total) by mouth every 6 (six) hours as needed for nausea or vomiting. 06/30/22   Fayrene Helper, PA-C  QUEtiapine (SEROQUEL)  25 MG tablet Take 1 tablet (25 mg total) by mouth at bedtime. 03/04/22     Rimegepant Sulfate (NURTEC) 75 MG TBDP Take 1 tablet (75 mg total) by mouth daily as needed. 11/29/22     Semaglutide-Weight Management (WEGOVY) 1.7 MG/0.75ML SOAJ Inject 1.7 mg into the skin once a week. 06/05/22     SUMAtriptan (IMITREX) 100 MG tablet Take 1 tablet by mouth once daily as needed for migraine. May repeat ONCE in 2 hours. 05/31/22     tirzepatide (ZEPBOUND) 10 MG/0.5ML Pen Inject 10 mg into the skin once a week. 09/17/22     tirzepatide (ZEPBOUND) 10 MG/0.5ML Pen Inject 10mg  under the skin once a week 30 days 09/26/22     tirzepatide (ZEPBOUND) 2.5 MG/0.5ML Pen Inject 2.5 mg into the skin once a week. 06/27/22     tirzepatide (ZEPBOUND) 5 MG/0.5ML Pen Inject 5 mg into the skin once a week. 07/25/22     URIBEL 81.6 MG TABS Take 1 tablet (81.6 mg total) by mouth 3 (three) times daily. 11/29/22     Vitamin D, Ergocalciferol, (DRISDOL) 1.25 MG (50000 UNIT) CAPS capsule Take 1 capsule (50,000 Units total) by mouth every 7 (seven) days. 10/28/22   [name], DO      Allergies    Tape, Prochlorperazine, Quinolones, and Reglan [metoclopramide]    Review of Systems   Review of Systems  Cardiovascular:  Positive for syncope.    Physical Exam Updated Vital Signs BP 111/70   Pulse 65   Temp 98.6 F (37 C) (Oral)   Resp 12   Wt 78 kg   LMP  (LMP Unknown)   SpO2 99%   BMI 31.46 kg/m  Physical Exam  ED Results / Procedures / Treatments   Labs (all labs ordered are listed, but only abnormal results are displayed) Labs Reviewed  BASIC METABOLIC PANEL - Abnormal; Notable for the following components:      Result Value   Potassium 3.4 (*)    Glucose, Bld 119 (*)    All other components within normal limits  CBC  PREGNANCY, URINE  TROPONIN I (HIGH SENSITIVITY)  TROPONIN I (HIGH SENSITIVITY)    EKG EKG Interpretation Date/Time:  Sunday December 23 2022 21:36:02 EDT Ventricular Rate:  76 PR  Interval:  154 QRS Duration:  86 QT Interval:  371 QTC Calculation: 418 R Axis:   50  Text Interpretation: Sinus rhythm Confirmed by Tilden Fossa [phone]) on 12/23/2022 11:07:39 PM  Radiology CT Head Wo Contrast  Result Date: 12/23/2022 CLINICAL DATA:  Head trauma EXAM: CT HEAD WITHOUT CONTRAST TECHNIQUE: Contiguous axial images were obtained from the base of  the skull through the vertex without intravenous contrast. RADIATION DOSE REDUCTION: This exam was performed according to the departmental dose-optimization program which includes automated exposure control, adjustment of the mA and/or kV according to patient size and/or use of iterative reconstruction technique. COMPARISON:  MRI 07/24/2020 FINDINGS: Brain: No acute territorial infarction, hemorrhage or intracranial mass. The ventricles are nonenlarged. Vascular: No hyperdense vessels.  No unexpected calcification Skull: Normal. Negative for fracture or focal lesion. Sinuses/Orbits: No acute finding. Other: None IMPRESSION: Negative non contrasted CT appearance of the brain. Electronically Signed   By: Jasmine Pang M.D.   On: 12/23/2022 22:53   DG Chest 2 View  Result Date: 12/23/2022 CLINICAL DATA:  Syncope EXAM: CHEST - 2 VIEW COMPARISON:  02/09/2020 FINDINGS: Left-sided pacing device as before. No acute airspace disease or effusion. Stable cardiomediastinal silhouette. No pneumothorax. IMPRESSION: No active cardiopulmonary disease. Electronically Signed   By: Jasmine Pang M.D.   On: 12/23/2022 22:33    Procedures Procedures  {Document cardiac monitor, telemetry assessment procedure when appropriate:1}  Medications Ordered in ED Medications - No data to display  ED Course/ Medical Decision Making/ A&P   {   Click here for ABCD2, HEART and other calculatorsREFRESH Note before signing :1}                          Medical Decision Making Amount and/or Complexity of Data Reviewed Labs: ordered. Radiology:  ordered.   ***  {Document critical care time when appropriate:1} {Document review of labs and clinical decision tools ie heart score, Chads2Vasc2 etc:1}  {Document your independent review of radiology images, and any outside records:1} {Document your discussion with family members, caretakers, and with consultants:1} {Document social determinants of health affecting pt's care:1} {Document your decision making why or why not admission, treatments were needed:1} Final Clinical Impression(s) / ED Diagnoses Final diagnoses:  None    Rx / DC Orders ED Discharge Orders     None

## 2022-12-23 NOTE — ED Notes (Signed)
Notified Christian PA of BP 94/68, patient is feeling dizzy. No orders received at this time.

## 2022-12-24 DIAGNOSIS — S0003XA Contusion of scalp, initial encounter: Secondary | ICD-10-CM | POA: Diagnosis not present

## 2022-12-24 LAB — URINALYSIS, ROUTINE W REFLEX MICROSCOPIC
Bilirubin Urine: NEGATIVE
Glucose, UA: NEGATIVE mg/dL
Hgb urine dipstick: NEGATIVE
Ketones, ur: NEGATIVE mg/dL
Leukocytes,Ua: NEGATIVE
Nitrite: NEGATIVE
Protein, ur: NEGATIVE mg/dL
Specific Gravity, Urine: 1.02 (ref 1.005–1.030)
pH: 6 (ref 5.0–8.0)

## 2022-12-24 MED ORDER — LACTATED RINGERS IV BOLUS
1000.0000 mL | Freq: Once | INTRAVENOUS | Status: AC
  Administered 2022-12-24: 1000 mL via INTRAVENOUS

## 2022-12-24 NOTE — ED Notes (Signed)
Received call back from [employer] regarding pts interrogation.

## 2022-12-24 NOTE — ED Notes (Signed)
Patient ambulatory to restroom with steady gait with standby assistance. Denies dizziness, chest pain, shob.

## 2022-12-25 DIAGNOSIS — G90A Postural orthostatic tachycardia syndrome (POTS): Secondary | ICD-10-CM | POA: Diagnosis not present

## 2022-12-25 DIAGNOSIS — R5383 Other fatigue: Secondary | ICD-10-CM | POA: Diagnosis not present

## 2022-12-25 DIAGNOSIS — R0989 Other specified symptoms and signs involving the circulatory and respiratory systems: Secondary | ICD-10-CM | POA: Diagnosis not present

## 2022-12-25 DIAGNOSIS — Z950 Presence of cardiac pacemaker: Secondary | ICD-10-CM | POA: Diagnosis not present

## 2022-12-26 ENCOUNTER — Encounter: Payer: Self-pay | Admitting: Family Medicine

## 2022-12-26 ENCOUNTER — Other Ambulatory Visit: Payer: Self-pay

## 2022-12-26 ENCOUNTER — Other Ambulatory Visit (HOSPITAL_BASED_OUTPATIENT_CLINIC_OR_DEPARTMENT_OTHER): Payer: Self-pay

## 2022-12-26 DIAGNOSIS — G90A Postural orthostatic tachycardia syndrome (POTS): Secondary | ICD-10-CM | POA: Diagnosis not present

## 2022-12-26 MED ORDER — LEVOTHYROXINE SODIUM 150 MCG PO TABS
150.0000 ug | ORAL_TABLET | Freq: Every day | ORAL | 3 refills | Status: DC
  Filled 2022-12-26: qty 30, 30d supply, fill #0

## 2022-12-26 MED ORDER — METOPROLOL TARTRATE 25 MG PO TABS
25.0000 mg | ORAL_TABLET | Freq: Two times a day (BID) | ORAL | 3 refills | Status: DC
  Filled 2022-12-26: qty 180, 90d supply, fill #0
  Filled 2023-11-26: qty 180, 90d supply, fill #1

## 2022-12-30 DIAGNOSIS — G90A Postural orthostatic tachycardia syndrome (POTS): Secondary | ICD-10-CM | POA: Diagnosis not present

## 2022-12-30 DIAGNOSIS — Z45018 Encounter for adjustment and management of other part of cardiac pacemaker: Secondary | ICD-10-CM | POA: Diagnosis not present

## 2022-12-30 DIAGNOSIS — Z950 Presence of cardiac pacemaker: Secondary | ICD-10-CM | POA: Diagnosis not present

## 2022-12-30 DIAGNOSIS — I495 Sick sinus syndrome: Secondary | ICD-10-CM | POA: Diagnosis not present

## 2022-12-31 ENCOUNTER — Other Ambulatory Visit (HOSPITAL_BASED_OUTPATIENT_CLINIC_OR_DEPARTMENT_OTHER): Payer: Self-pay

## 2023-01-03 ENCOUNTER — Ambulatory Visit: Payer: Managed Care, Other (non HMO) | Admitting: Family Medicine

## 2023-01-03 ENCOUNTER — Other Ambulatory Visit (HOSPITAL_BASED_OUTPATIENT_CLINIC_OR_DEPARTMENT_OTHER): Payer: Self-pay

## 2023-01-03 DIAGNOSIS — B951 Streptococcus, group B, as the cause of diseases classified elsewhere: Secondary | ICD-10-CM | POA: Diagnosis not present

## 2023-01-03 DIAGNOSIS — R310 Gross hematuria: Secondary | ICD-10-CM | POA: Diagnosis not present

## 2023-01-03 DIAGNOSIS — N200 Calculus of kidney: Secondary | ICD-10-CM | POA: Diagnosis not present

## 2023-01-03 DIAGNOSIS — R300 Dysuria: Secondary | ICD-10-CM | POA: Diagnosis not present

## 2023-01-03 DIAGNOSIS — N390 Urinary tract infection, site not specified: Secondary | ICD-10-CM | POA: Diagnosis not present

## 2023-01-03 MED ORDER — NITROFURANTOIN MACROCRYSTAL 100 MG PO CAPS
100.0000 mg | ORAL_CAPSULE | Freq: Every day | ORAL | 0 refills | Status: DC
  Filled 2023-01-03 (×2): qty 60, 60d supply, fill #0

## 2023-01-09 DIAGNOSIS — Z1231 Encounter for screening mammogram for malignant neoplasm of breast: Secondary | ICD-10-CM | POA: Diagnosis not present

## 2023-01-10 NOTE — Progress Notes (Deleted)
[name] [address] [place] [address] Phone: [phone] Subjective:    I'm seeing this patient by the request  of:  Marthenia Rolling, MD  CC:   [id number]  11/01/2022 Repeat injection given again today. Tolerated very previously very well for 6 months and hopefully this will make some improvement again. We did do injections in the trapezius, rhomboid, latissimus dorsi and levator scapula. Discussed which activities to do and which ones to avoid. Patient will continue to monitor. Patient has had multiple different muscle relaxers including cyclobenzaprine. Follow-up again in 2 months   Updated 01/18/2023 Carmen [name] is a 42 y.o. female coming in with complaint of shoulder pain       Past Medical History:  Diagnosis Date   Back pain    Costochondritis 09/13/2010   GERD (gastroesophageal reflux disease)    Heart palpitations    long history of    Heartburn    History of chest pain    History of fatigue    History of kidney stones    History of migraine headaches    History of tilt table evaluation    IMPRESSION: Positive tilt table study with significant cardioinhibitory  response.   Hyperlipidemia    Hypertension    Hypothyroidism    Kidney stones    history of   migraine    Neurocardiogenic syncope    Palpitations    Pericarditis 09/13/2010   Presence of permanent cardiac pacemaker    PSVT (paroxysmal supraventricular tachycardia)    Right ureteral calculus    Sleeping difficulties    Stress    Stroke (HCC) 06/15/2019   no residual deficits, tpa given at time of stroke   Stroke [facility])    tia jan 2022   SVD (spontaneous vaginal delivery)    x 1   Syncope and collapse    neurocardiogenic syncopy started age 30   Wears glasses    Past Surgical History:  Procedure Laterality Date   ANTERIOR AND POSTERIOR REPAIR N/A 11/14/2017   Procedure: ANTERIOR (CYSTOCELE) AND POSTERIOR REPAIR (RECTOCELE);  Surgeon: Silverio Lay, MD;  Location: [facility];  Service: Gynecology;  Laterality: N/A;   BILATERAL SALPINGECTOMY Bilateral 11/14/2017   Procedure: BILATERAL SALPINGECTOMY;  Surgeon: Silverio Lay, MD;  Location: [facility];  Service: Gynecology;  Laterality: Bilateral;   BLADDER SUSPENSION Bilateral 11/14/2017   Procedure: TRANSVAGINAL TAPE (TVT) PROCEDURE;  Surgeon: Osborn Coho, MD;  Location: [facility];  Service: Gynecology;  Laterality: Bilateral;   CHOLECYSTECTOMY  11/2007   Cholecystitis - Laparoscopic cholecystectomy with intraoperative  cholangiogram.   CYSTOSCOPY N/A 11/14/2017   Procedure: CYSTOSCOPY;  Surgeon: Osborn Coho, MD;  Location: [facility];  Service: Gynecology;  Laterality: N/A;   CYSTOSCOPY WITH RETROGRADE PYELOGRAM, URETEROSCOPY AND STENT PLACEMENT Right 06/25/2019   Procedure: CYSTOSCOPY WITH RETROGRADE PYELOGRAM, AND RIGHT  STENT PLACEMENT;  Surgeon: Noel Christmas, MD;  Location: [facility];  Service: Urology;  Laterality: Right;   CYSTOSCOPY WITH RETROGRADE PYELOGRAM, URETEROSCOPY AND STENT PLACEMENT Right 07/07/2019   Procedure: CYSTOSCOPY , URETEROSCOPY AND STENT REPLACEMENT;  Surgeon: Noel Christmas, MD;  Location: [facility];  Service: Urology;  Laterality: Right;   CYSTOSCOPY WITH RETROGRADE PYELOGRAM, URETEROSCOPY AND STENT PLACEMENT Right 03/23/2021   Procedure: CYSTOSCOPY WITH RETROGRADE PYELOGRAM, URETEROSCOPY AND STENT PLACEMENT;  Surgeon: Marcine Matar, MD;  Location: [facility];  Service: Urology;  Laterality: Right;   CYSTOSCOPY/URETEROSCOPY/HOLMIUM LASER/STENT PLACEMENT Left 07/05/2022   Procedure: CYSTOSCOPY LEFT  RETROGRADE PYELOGRAM URETEROSCOPY/HOLMIUM LASER/STENT PLACEMENT;  Surgeon: Jerilee Field, MD;  Location: [facility];  Service: Urology;  Laterality: Left;   HOLMIUM LASER APPLICATION Right 07/07/2019   Procedure: HOLMIUM LASER APPLICATION;  Surgeon: Noel Christmas, MD;  Location: [facility];  Service: Urology;   Laterality: Right;   HOLMIUM LASER APPLICATION Right 03/23/2021   Procedure: HOLMIUM LASER APPLICATION;  Surgeon: Marcine Matar, MD;  Location: [facility];  Service: Urology;  Laterality: Right;   PACEMAKER INSERTION  2016   Tilt Table Study  07/17/2005   Positive tilt table study with significant cardioinhibitory  response.   VAGINAL HYSTERECTOMY N/A 11/14/2017   Procedure: HYSTERECTOMY VAGINAL;  Surgeon: Silverio Lay, MD;  Location: [facility];  Service: Gynecology;  Laterality: N/A;   WISDOM TOOTH EXTRACTION     age 1   Social History   Socioeconomic History   Marital status: Married    Spouse name: Not on file   Number of children: Not on file   Years of education: Not on file   Highest education level: Not on file  Occupational History   Occupation: mammography tech  Tobacco Use   Smoking status: Never   Smokeless tobacco: Never  Vaping Use   Vaping status: Never Used  Substance and Sexual Activity   Alcohol use: Yes    Comment: very rare   Drug use: Never   Sexual activity: Not on file  Other Topics Concern   Not on file  Social History Narrative   Not on file   Social Determinants of Health   Financial Resource Strain: Low Risk  (11/01/2022)   Received from [facility], [facility]   Overall Financial Resource Strain (CARDIA)    Difficulty of Paying Living Expenses: Not hard at all  Food Insecurity: No Food Insecurity (11/01/2022)   Received from [facility], [facility]   Hunger Vital Sign    Worried About Running Out of Food in the Last Year: Never true    Ran Out of Food in the Last Year: Never true  Transportation Needs: No Transportation Needs (11/01/2022)   Received from [employer], [facility]   PRAPARE - Transportation    Lack of Transportation (Medical): No    Lack of Transportation (Non-Medical): No  Physical Activity: Not on file  Stress: Not on file  Social Connections: Unknown (10/23/2021)   Received from [facility], [facility]   Social Network    Social Network: Not on file   Allergies  Allergen Reactions   Tape Hives    Adhesive tape - tegaderm/paper tape ok   Prochlorperazine Other (See Comments)    Akathisia with compazine in ED on 05/17/19.  Resolved with repeat dose of benadryl.      Quinolones Nausea Only   Reglan [Metoclopramide] Other (See Comments)    flinging legs and "not feeling right".    Family History  Problem Relation Age of Onset   Hypertension Mother    Heart disease Mother    Cancer Mother        breast   Breast cancer Mother 25   Diabetes Mother    Hyperlipidemia Mother    Thyroid disease Mother    Depression Mother    Anxiety disorder Mother    Bipolar disorder Mother    Liver disease Mother    Alcoholism Mother    Obesity Mother    Heart attack Father    Hyperlipidemia Father    Hepatitis C Father  Diabetes Father    Hypertension Father    Heart disease Father    Sudden Cardiac Death Father    Depression Father    Anxiety disorder Father    Liver disease Father    Alcoholism Father    Drug abuse Father    Obesity Father    Cancer Maternal Aunt        Breast   Breast cancer Maternal Aunt 41   Breast cancer Maternal Grandmother 65    Current Outpatient Medications (Endocrine & Metabolic):    levothyroxine (SYNTHROID) 150 MCG tablet, Take 1 tablet (150 mcg total) by mouth daily before breakfast.   metFORMIN (GLUCOPHAGE-XR) 500 MG 24 hr tablet, Take 1 tablet (500 mg total) by mouth every evening with meal  Current Outpatient Medications (Cardiovascular):    hydrochlorothiazide (HYDRODIURIL) 25 MG tablet, Take 1 tablet (25 mg total) by mouth in the morning.   metoprolol succinate (TOPROL-XL) 100 MG 24 hr tablet, Take one tablet (100 mg dose) by mouth daily. (Patient taking differently: Take 100 mg by mouth at bedtime.)   metoprolol succinate (TOPROL-XL) 50 MG 24 hr tablet, Take 2 tablets (100 mg total) by mouth in the morning AND 1 tablet (50  mg total) every evening. (Patient taking differently: Take 50 mg in the morning)   metoprolol succinate (TOPROL-XL) 50 MG 24 hr tablet, Take 1 tablet (50 mg total) by mouth every morning AND 2 tablets (100 mg total) Nightly.   metoprolol tartrate (LOPRESSOR) 25 MG tablet, Take 1 tablet (25 mg total) by mouth 2 (two) times daily.  Current Outpatient Medications (Respiratory):    promethazine (PHENERGAN) 25 MG tablet, Take 1 tablet (25 mg total) by mouth every 6 (six) hours as needed for nausea or vomiting.  Current Outpatient Medications (Analgesics):    aspirin EC 81 MG tablet, Take 81 mg by mouth daily. Swallow whole.   meloxicam (MOBIC) 15 MG tablet, Take 1 tablet (15 mg total) by mouth daily.   Rimegepant Sulfate (NURTEC) 75 MG TBDP, Take 1 tablet (75 mg total) by mouth daily as needed.   SUMAtriptan (IMITREX) 100 MG tablet, Take 1 tablet by mouth once daily as needed for migraine. May repeat ONCE in 2 hours.   Current Outpatient Medications (Other):    buPROPion (WELLBUTRIN XL) 150 MG 24 hr tablet, Take 1 tablet (150 mg total) by mouth in the morning.   buPROPion (WELLBUTRIN XL) 300 MG 24 hr tablet, Take 1 tablet (300 mg total) by mouth every morning.   busPIRone (BUSPAR) 10 MG tablet, Take 2 tablets (20 mg total) by mouth at bedtime.   busPIRone (BUSPAR) 10 MG tablet, Take two tablets (20 mg dose) by mouth 2 (two) times daily.   cephALEXin (KEFLEX) 500 MG capsule, Take 1 capsule (500 mg total) by mouth 2 (two) times daily.   ciprofloxacin (CIPRO) 500 MG tablet, Take 1 tablet (500 mg total) by mouth 2 (two) times daily.   clonazePAM (KLONOPIN) 0.5 MG tablet, Take 1 tablet (0.5 mg total) by mouth 2 (two) times daily. Max Daily Amount: 1 mg (2 tablets).   cyclobenzaprine (FLEXERIL) 5 MG tablet, Take 1 tablet (5 mg total) by mouth at bedtime as needed for muscle spasms.   desvenlafaxine (PRISTIQ) 50 MG 24 hr tablet, Take 1 tablet (50 mg total) by mouth daily.   eszopiclone (LUNESTA) 2 MG  TABS tablet, Take 1 tablet (2 mg total) by mouth immediately before bedtime.   gabapentin (NEURONTIN) 100 MG capsule, Take 2 capsules (200 mg  total) by mouth at bedtime.   nitrofurantoin (MACRODANTIN) 100 MG capsule, Take 1 capsule (100 mg total) by mouth at bedtime.   nitrofurantoin, macrocrystal-monohydrate, (MACROBID) 100 MG capsule, Take 1 capsule (100 mg total) by mouth 2 (two) times daily.   pantoprazole (PROTONIX) 40 MG tablet, Take 1 tablet (40 mg total) by mouth daily.   Phentermine HCl (LOMAIRA) 8 MG TABS, Take 1 tablet by mouth 30 minutes before breakfast once a day   QUEtiapine (SEROQUEL) 25 MG tablet, Take 1 tablet (25 mg total) by mouth at bedtime.   Semaglutide-Weight Management (WEGOVY) 1.7 MG/0.75ML SOAJ, Inject 1.7 mg into the skin once a week.   tirzepatide (ZEPBOUND) 10 MG/0.5ML Pen, Inject 10 mg into the skin once a week.   tirzepatide (ZEPBOUND) 10 MG/0.5ML Pen, Inject 10mg  under the skin once a week 30 days   tirzepatide (ZEPBOUND) 2.5 MG/0.5ML Pen, Inject 2.5 mg into the skin once a week.   tirzepatide (ZEPBOUND) 5 MG/0.5ML Pen, Inject 5 mg into the skin once a week.   URIBEL 81.6 MG TABS, Take 1 tablet (81.6 mg total) by mouth 3 (three) times daily.   Vitamin D, Ergocalciferol, (DRISDOL) 1.25 MG (50000 UNIT) CAPS capsule, Take 1 capsule (50,000 Units total) by mouth every 7 (seven) days.   Reviewed prior external information including notes and imaging from  primary care provider As well as notes that were available from care everywhere and other healthcare systems.  Past medical history, social, surgical and family history all reviewed in electronic medical record.  No pertanent information unless stated regarding to the chief complaint.   Review of Systems:  No headache, visual changes, nausea, vomiting, diarrhea, constipation, dizziness, abdominal pain, skin rash, fevers, chills, night sweats, weight loss, swollen lymph nodes, body aches, joint swelling, chest pain,  shortness of breath, mood changes. POSITIVE muscle aches  Objective  There were no vitals taken for this visit.   General: No apparent distress alert and oriented x3 mood and affect normal, dressed appropriately.  HEENT: Pupils equal, extraocular movements intact  Respiratory: Patient's speak in full sentences and does not appear short of breath  Cardiovascular: No lower extremity edema, non tender, no erythema      Impression and Recommendations:

## 2023-01-15 ENCOUNTER — Other Ambulatory Visit (HOSPITAL_BASED_OUTPATIENT_CLINIC_OR_DEPARTMENT_OTHER): Payer: Self-pay

## 2023-01-15 MED ORDER — PREDNISONE 20 MG PO TABS
ORAL_TABLET | ORAL | 0 refills | Status: DC
  Filled 2023-01-15: qty 20, 13d supply, fill #0

## 2023-01-15 MED ORDER — HYDROCOD POLI-CHLORPHE POLI ER 10-8 MG/5ML PO SUER
5.0000 mL | Freq: Two times a day (BID) | ORAL | 0 refills | Status: DC | PRN
  Filled 2023-01-15: qty 115, 12d supply, fill #0

## 2023-01-18 ENCOUNTER — Ambulatory Visit: Payer: Managed Care, Other (non HMO) | Admitting: Family Medicine

## 2023-01-22 DIAGNOSIS — E559 Vitamin D deficiency, unspecified: Secondary | ICD-10-CM | POA: Diagnosis not present

## 2023-01-22 DIAGNOSIS — I10 Essential (primary) hypertension: Secondary | ICD-10-CM | POA: Diagnosis not present

## 2023-01-22 DIAGNOSIS — E782 Mixed hyperlipidemia: Secondary | ICD-10-CM | POA: Diagnosis not present

## 2023-01-22 DIAGNOSIS — Z6831 Body mass index (BMI) 31.0-31.9, adult: Secondary | ICD-10-CM | POA: Diagnosis not present

## 2023-01-22 DIAGNOSIS — E669 Obesity, unspecified: Secondary | ICD-10-CM | POA: Diagnosis not present

## 2023-01-24 ENCOUNTER — Other Ambulatory Visit (HOSPITAL_BASED_OUTPATIENT_CLINIC_OR_DEPARTMENT_OTHER): Payer: Self-pay

## 2023-01-25 ENCOUNTER — Other Ambulatory Visit: Payer: Self-pay

## 2023-01-25 ENCOUNTER — Other Ambulatory Visit (HOSPITAL_BASED_OUTPATIENT_CLINIC_OR_DEPARTMENT_OTHER): Payer: Self-pay

## 2023-01-25 MED ORDER — CLONAZEPAM 0.5 MG PO TABS
0.5000 mg | ORAL_TABLET | Freq: Two times a day (BID) | ORAL | 1 refills | Status: DC
  Filled 2023-01-25: qty 45, 23d supply, fill #0
  Filled 2023-02-23: qty 45, 23d supply, fill #1

## 2023-01-29 ENCOUNTER — Other Ambulatory Visit (HOSPITAL_BASED_OUTPATIENT_CLINIC_OR_DEPARTMENT_OTHER): Payer: Self-pay

## 2023-01-29 MED ORDER — HYDROXYZINE HCL 25 MG PO TABS
25.0000 mg | ORAL_TABLET | Freq: Three times a day (TID) | ORAL | 0 refills | Status: DC | PRN
  Filled 2023-01-29: qty 30, 10d supply, fill #0

## 2023-01-30 ENCOUNTER — Other Ambulatory Visit (HOSPITAL_BASED_OUTPATIENT_CLINIC_OR_DEPARTMENT_OTHER): Payer: Self-pay

## 2023-02-01 DIAGNOSIS — J018 Other acute sinusitis: Secondary | ICD-10-CM | POA: Diagnosis not present

## 2023-02-14 NOTE — Progress Notes (Unsigned)
[name] [address] [place] [address] Phone: [phone] Subjective:   Carmen [name], am serving as a scribe for Dr. Antoine Primas.  I'm seeing this patient by the request  of:  Marthenia Rolling, MD  CC: shoulder pain follow up   [id number]  11/01/2022 Repeat injection given again today. Tolerated very previously very well for 6 months and hopefully this will make some improvement again. We did do injections in the trapezius, rhomboid, latissimus dorsi and levator scapula. Discussed which activities to do and which ones to avoid. Patient will continue to monitor. Patient has had multiple different muscle relaxers including cyclobenzaprine. Follow-up again in 2 months   Updated 02/18/2023 Carmen [name] is a 42 y.o. female coming in with complaint of shoulder pain, did trigger point injections 3 months ago. Worked but not as well as previous times. Is marked by coworkers. Check thyroid?  Did increase synthroid to  Outside records do show that patient has been going to metabolic medicine and attempting to lose weight as well.    Past Medical History:  Diagnosis Date   Back pain    Costochondritis 09/13/2010   GERD (gastroesophageal reflux disease)    Heart palpitations    long history of    Heartburn    History of chest pain    History of fatigue    History of kidney stones    History of migraine headaches    History of tilt table evaluation    IMPRESSION: Positive tilt table study with significant cardioinhibitory  response.   Hyperlipidemia    Hypertension    Hypothyroidism    Kidney stones    history of   migraine    Neurocardiogenic syncope    Palpitations    Pericarditis 09/13/2010   Presence of permanent cardiac pacemaker    PSVT (paroxysmal supraventricular tachycardia)    Right ureteral calculus    Sleeping difficulties    Stress    Stroke (HCC) 06/15/2019   no residual deficits, tpa given at time of stroke    Stroke [facility])    tia jan 2022   SVD (spontaneous vaginal delivery)    x 1   Syncope and collapse    neurocardiogenic syncopy started age 1   Wears glasses    Past Surgical History:  Procedure Laterality Date   ANTERIOR AND POSTERIOR REPAIR N/A 11/14/2017   Procedure: ANTERIOR (CYSTOCELE) AND POSTERIOR REPAIR (RECTOCELE);  Surgeon: Silverio Lay, MD;  Location: [facility];  Service: Gynecology;  Laterality: N/A;   BILATERAL SALPINGECTOMY Bilateral 11/14/2017   Procedure: BILATERAL SALPINGECTOMY;  Surgeon: Silverio Lay, MD;  Location: [facility];  Service: Gynecology;  Laterality: Bilateral;   BLADDER SUSPENSION Bilateral 11/14/2017   Procedure: TRANSVAGINAL TAPE (TVT) PROCEDURE;  Surgeon: Osborn Coho, MD;  Location: [facility];  Service: Gynecology;  Laterality: Bilateral;   CHOLECYSTECTOMY  11/2007   Cholecystitis - Laparoscopic cholecystectomy with intraoperative  cholangiogram.   CYSTOSCOPY N/A 11/14/2017   Procedure: CYSTOSCOPY;  Surgeon: Osborn Coho, MD;  Location: [facility];  Service: Gynecology;  Laterality: N/A;   CYSTOSCOPY WITH RETROGRADE PYELOGRAM, URETEROSCOPY AND STENT PLACEMENT Right 06/25/2019   Procedure: CYSTOSCOPY WITH RETROGRADE PYELOGRAM, AND RIGHT  STENT PLACEMENT;  Surgeon: Noel Christmas, MD;  Location: [facility];  Service: Urology;  Laterality: Right;   CYSTOSCOPY WITH RETROGRADE PYELOGRAM, URETEROSCOPY AND STENT PLACEMENT Right 07/07/2019   Procedure: CYSTOSCOPY , URETEROSCOPY AND STENT REPLACEMENT;  Surgeon: Noel Christmas, MD;  Location: [facility];  Service: Urology;  Laterality: Right;   CYSTOSCOPY WITH RETROGRADE PYELOGRAM, URETEROSCOPY AND STENT PLACEMENT Right 03/23/2021   Procedure: CYSTOSCOPY WITH RETROGRADE PYELOGRAM, URETEROSCOPY AND STENT PLACEMENT;  Surgeon: Marcine Matar, MD;  Location: [facility];  Service: Urology;  Laterality: Right;   CYSTOSCOPY/URETEROSCOPY/HOLMIUM LASER/STENT PLACEMENT Left 07/05/2022    Procedure: CYSTOSCOPY LEFT RETROGRADE PYELOGRAM URETEROSCOPY/HOLMIUM LASER/STENT PLACEMENT;  Surgeon: Jerilee Field, MD;  Location: [facility];  Service: Urology;  Laterality: Left;   HOLMIUM LASER APPLICATION Right 07/07/2019   Procedure: HOLMIUM LASER APPLICATION;  Surgeon: Noel Christmas, MD;  Location: [facility];  Service: Urology;  Laterality: Right;   HOLMIUM LASER APPLICATION Right 03/23/2021   Procedure: HOLMIUM LASER APPLICATION;  Surgeon: Marcine Matar, MD;  Location: [facility];  Service: Urology;  Laterality: Right;   PACEMAKER INSERTION  2016   Tilt Table Study  07/17/2005   Positive tilt table study with significant cardioinhibitory  response.   VAGINAL HYSTERECTOMY N/A 11/14/2017   Procedure: HYSTERECTOMY VAGINAL;  Surgeon: Silverio Lay, MD;  Location: [facility];  Service: Gynecology;  Laterality: N/A;   WISDOM TOOTH EXTRACTION     age 5   Social History   Socioeconomic History   Marital status: Married    Spouse name: Not on file   Number of children: Not on file   Years of education: Not on file   Highest education level: Not on file  Occupational History   Occupation: mammography tech  Tobacco Use   Smoking status: Never   Smokeless tobacco: Never  Vaping Use   Vaping status: Never Used  Substance and Sexual Activity   Alcohol use: Yes    Comment: very rare   Drug use: Never   Sexual activity: Not on file  Other Topics Concern   Not on file  Social History Narrative   Not on file   Social Determinants of Health   Financial Resource Strain: Low Risk  (01/15/2023)   Received from [facility]   Overall Financial Resource Strain (CARDIA)    Difficulty of Paying Living Expenses: Not very hard  Food Insecurity: No Food Insecurity (01/15/2023)   Received from [facility]   Hunger Vital Sign    Worried About Running Out of Food in the Last Year: Never true    Ran Out of Food in the Last Year: Never true   Transportation Needs: No Transportation Needs (01/15/2023)   Received from [facility] - Transportation    Lack of Transportation (Medical): No    Lack of Transportation (Non-Medical): No  Physical Activity: Insufficiently Active (01/15/2023)   Received from [facility]   Exercise Vital Sign    Days of Exercise per Week: 2 days    Minutes of Exercise per Session: 40 min  Stress: No Stress Concern Present (01/15/2023)   Received from [facility] - Occupational Stress Questionnaire    Feeling of Stress : Only a little  Social Connections: Somewhat Isolated (01/15/2023)   Received from [facility]   Social Network    How would you rate your social network (family, work, friends)?: Restricted participation with some degree of social isolation   Allergies  Allergen Reactions   Tape Hives    Adhesive tape - tegaderm/paper tape ok   Prochlorperazine Other (See Comments)    Akathisia with compazine in ED on 05/17/19.  Resolved with repeat dose of benadryl.      Quinolones  Nausea Only   Reglan [Metoclopramide] Other (See Comments)    flinging legs and "not feeling right".    Family History  Problem Relation Age of Onset   Hypertension Mother    Heart disease Mother    Cancer Mother        breast   Breast cancer Mother 77   Diabetes Mother    Hyperlipidemia Mother    Thyroid disease Mother    Depression Mother    Anxiety disorder Mother    Bipolar disorder Mother    Liver disease Mother    Alcoholism Mother    Obesity Mother    Heart attack Father    Hyperlipidemia Father    Hepatitis C Father    Diabetes Father    Hypertension Father    Heart disease Father    Sudden Cardiac Death Father    Depression Father    Anxiety disorder Father    Liver disease Father    Alcoholism Father    Drug abuse Father    Obesity Father    Cancer Maternal Aunt        Breast   Breast cancer Maternal Aunt 47   Breast cancer  Maternal Grandmother 44    Current Outpatient Medications (Endocrine & Metabolic):    levothyroxine (SYNTHROID) 150 MCG tablet, Take 1 tablet (150 mcg total) by mouth daily before breakfast.   metFORMIN (GLUCOPHAGE-XR) 500 MG 24 hr tablet, Take 1 tablet (500 mg total) by mouth every evening with meal   predniSONE (DELTASONE) 20 MG tablet, Take 3 tablets (60 mg total) by mouth daily for 3 days, then 2 tablets (40 mg total) daily for 3 days, then 1 tablet (20 mg total) daily for 3 days, and then 1/2 tablet (10 mg total) daily for 4 days.  Current Outpatient Medications (Cardiovascular):    metoprolol succinate (TOPROL-XL) 100 MG 24 hr tablet, Take one tablet (100 mg dose) by mouth daily. (Patient taking differently: Take 100 mg by mouth at bedtime.)   metoprolol succinate (TOPROL-XL) 50 MG 24 hr tablet, Take 2 tablets (100 mg total) by mouth in the morning AND 1 tablet (50 mg total) every evening. (Patient taking differently: Take 50 mg in the morning)   metoprolol succinate (TOPROL-XL) 50 MG 24 hr tablet, Take 1 tablet (50 mg total) by mouth every morning AND 2 tablets (100 mg total) Nightly.   metoprolol tartrate (LOPRESSOR) 25 MG tablet, Take 1 tablet (25 mg total) by mouth 2 (two) times daily.  Current Outpatient Medications (Respiratory):    chlorpheniramine-HYDROcodone (TUSSIONEX) 10-8 MG/5ML, Take 5 mLs by mouth every 12 (twelve) hours as needed for cough. Max Daily Amount: 10 mLs.   promethazine (PHENERGAN) 25 MG tablet, Take 1 tablet (25 mg total) by mouth every 6 (six) hours as needed for nausea or vomiting.  Current Outpatient Medications (Analgesics):    aspirin EC 81 MG tablet, Take 81 mg by mouth daily. Swallow whole.   meloxicam (MOBIC) 15 MG tablet, Take 1 tablet (15 mg total) by mouth daily.   Rimegepant Sulfate (NURTEC) 75 MG TBDP, Take 1 tablet (75 mg total) by mouth daily as needed.   SUMAtriptan (IMITREX) 100 MG tablet, Take 1 tablet by mouth once daily as needed for  migraine. May repeat ONCE in 2 hours.   Current Outpatient Medications (Other):    buPROPion (WELLBUTRIN XL) 150 MG 24 hr tablet, Take 1 tablet (150 mg total) by mouth in the morning.   buPROPion (WELLBUTRIN XL) 300 MG 24 hr  tablet, Take 1 tablet (300 mg total) by mouth every morning.   busPIRone (BUSPAR) 10 MG tablet, Take 2 tablets (20 mg total) by mouth at bedtime.   busPIRone (BUSPAR) 10 MG tablet, Take two tablets (20 mg dose) by mouth 2 (two) times daily.   cephALEXin (KEFLEX) 500 MG capsule, Take 1 capsule (500 mg total) by mouth 2 (two) times daily.   ciprofloxacin (CIPRO) 500 MG tablet, Take 1 tablet (500 mg total) by mouth 2 (two) times daily.   clonazePAM (KLONOPIN) 0.5 MG tablet, Take 1 tablet (0.5 mg total) by mouth 2 (two) times daily. Max 2 tablets daily.   cyclobenzaprine (FLEXERIL) 5 MG tablet, Take 1 tablet (5 mg total) by mouth at bedtime as needed for muscle spasms.   desvenlafaxine (PRISTIQ) 50 MG 24 hr tablet, Take 1 tablet (50 mg total) by mouth daily.   eszopiclone (LUNESTA) 2 MG TABS tablet, Take 1 tablet (2 mg total) by mouth immediately before bedtime.   gabapentin (NEURONTIN) 100 MG capsule, Take 2 capsules (200 mg total) by mouth at bedtime.   hydrOXYzine (ATARAX) 25 MG tablet, Take one tablet (25 mg dose) by mouth 3 (three) times a day as needed for itching for up to 10 days.   nitrofurantoin (MACRODANTIN) 100 MG capsule, Take 1 capsule (100 mg total) by mouth at bedtime.   nitrofurantoin, macrocrystal-monohydrate, (MACROBID) 100 MG capsule, Take 1 capsule (100 mg total) by mouth 2 (two) times daily.   pantoprazole (PROTONIX) 40 MG tablet, Take 1 tablet (40 mg total) by mouth daily.   Phentermine HCl (LOMAIRA) 8 MG TABS, Take 1 tablet by mouth 30 minutes before breakfast once a day   QUEtiapine (SEROQUEL) 25 MG tablet, Take 1 tablet (25 mg total) by mouth at bedtime.   Semaglutide-Weight Management (WEGOVY) 1.7 MG/0.75ML SOAJ, Inject 1.7 mg into the skin once a  week.   tirzepatide (ZEPBOUND) 10 MG/0.5ML Pen, Inject 10 mg into the skin once a week.   tirzepatide (ZEPBOUND) 10 MG/0.5ML Pen, Inject 10mg  under the skin once a week 30 days   tirzepatide (ZEPBOUND) 2.5 MG/0.5ML Pen, Inject 2.5 mg into the skin once a week.   tirzepatide (ZEPBOUND) 5 MG/0.5ML Pen, Inject 5 mg into the skin once a week.   URIBEL 81.6 MG TABS, Take 1 tablet (81.6 mg total) by mouth 3 (three) times daily.   Vitamin D, Ergocalciferol, (DRISDOL) 1.25 MG (50000 UNIT) CAPS capsule, Take 1 capsule (50,000 Units total) by mouth every 7 (seven) days.   Reviewed prior external information including notes and imaging from  primary care provider As well as notes that were available from care everywhere and other healthcare systems.  Past medical history, social, surgical and family history all reviewed in electronic medical record.  No pertanent information unless stated regarding to the chief complaint.   Review of Systems:  No headache, visual changes, nausea, vomiting, diarrhea, constipation, dizziness, abdominal pain, skin rash, fevers, chills, night sweats, weight loss, swollen lymph nodes, body aches, joint swelling, chest pain, shortness of breath, mood changes. POSITIVE muscle aches  Objective  There were no vitals taken for this visit.   General: No apparent distress alert and oriented x3 mood and affect normal, dressed appropriately.  HEENT: Pupils equal, extraocular movements intact  Respiratory: Patient's speak in full sentences and does not appear short of breath  Cardiovascular: No lower extremity edema, non tender, no erythema      Impression and Recommendations:    The above documentation has been reviewed  and is accurate and complete [name], DO

## 2023-02-18 ENCOUNTER — Ambulatory Visit: Payer: Commercial Managed Care - PPO | Admitting: Family Medicine

## 2023-02-18 VITALS — BP 122/84 | HR 88 | Ht 62.0 in | Wt 181.0 lb

## 2023-02-18 DIAGNOSIS — M25512 Pain in left shoulder: Secondary | ICD-10-CM

## 2023-02-18 DIAGNOSIS — M9901 Segmental and somatic dysfunction of cervical region: Secondary | ICD-10-CM | POA: Diagnosis not present

## 2023-02-18 DIAGNOSIS — E039 Hypothyroidism, unspecified: Secondary | ICD-10-CM | POA: Diagnosis not present

## 2023-02-18 DIAGNOSIS — M2550 Pain in unspecified joint: Secondary | ICD-10-CM | POA: Diagnosis not present

## 2023-02-18 DIAGNOSIS — M9908 Segmental and somatic dysfunction of rib cage: Secondary | ICD-10-CM | POA: Diagnosis not present

## 2023-02-18 DIAGNOSIS — M9903 Segmental and somatic dysfunction of lumbar region: Secondary | ICD-10-CM | POA: Diagnosis not present

## 2023-02-18 DIAGNOSIS — M9904 Segmental and somatic dysfunction of sacral region: Secondary | ICD-10-CM | POA: Diagnosis not present

## 2023-02-18 DIAGNOSIS — G8929 Other chronic pain: Secondary | ICD-10-CM

## 2023-02-18 DIAGNOSIS — M25511 Pain in right shoulder: Secondary | ICD-10-CM

## 2023-02-18 DIAGNOSIS — M9902 Segmental and somatic dysfunction of thoracic region: Secondary | ICD-10-CM

## 2023-02-18 LAB — TSH: TSH: 0.15 u[IU]/mL — ABNORMAL LOW (ref 0.35–5.50)

## 2023-02-18 NOTE — Patient Instructions (Addendum)
Labs today Trigger point injections See you again in 8 weeks

## 2023-02-19 ENCOUNTER — Encounter: Payer: Self-pay | Admitting: Family Medicine

## 2023-02-19 LAB — T4, FREE: Free T4: 1.76 ng/dL — ABNORMAL HIGH (ref 0.60–1.60)

## 2023-02-19 LAB — T3, FREE: T3, Free: 4.2 pg/mL (ref 2.3–4.2)

## 2023-02-19 NOTE — Assessment & Plan Note (Signed)

## 2023-02-19 NOTE — Assessment & Plan Note (Signed)
Thyroid does have abnormal values, will see if armour is neede,d could be contributing to pain

## 2023-02-19 NOTE — Assessment & Plan Note (Signed)
Do think some of it is stress related as well as some of it is potentially cervical related.  Discussed icing regimen and home exercises.  Discussed icing regimen and home exercises.  Follow-up again in 6 to 8 weeks

## 2023-02-23 ENCOUNTER — Other Ambulatory Visit (HOSPITAL_BASED_OUTPATIENT_CLINIC_OR_DEPARTMENT_OTHER): Payer: Self-pay

## 2023-02-24 ENCOUNTER — Encounter: Payer: Self-pay | Admitting: Family Medicine

## 2023-02-25 ENCOUNTER — Other Ambulatory Visit (HOSPITAL_BASED_OUTPATIENT_CLINIC_OR_DEPARTMENT_OTHER): Payer: Self-pay

## 2023-02-25 MED ORDER — HYDROXYZINE HCL 25 MG PO TABS
25.0000 mg | ORAL_TABLET | Freq: Three times a day (TID) | ORAL | 0 refills | Status: DC | PRN
  Filled 2023-02-25: qty 30, 10d supply, fill #0

## 2023-02-27 ENCOUNTER — Other Ambulatory Visit (HOSPITAL_BASED_OUTPATIENT_CLINIC_OR_DEPARTMENT_OTHER): Payer: Self-pay

## 2023-02-27 ENCOUNTER — Other Ambulatory Visit: Payer: Self-pay

## 2023-02-27 DIAGNOSIS — Z6831 Body mass index (BMI) 31.0-31.9, adult: Secondary | ICD-10-CM | POA: Diagnosis not present

## 2023-02-27 DIAGNOSIS — E559 Vitamin D deficiency, unspecified: Secondary | ICD-10-CM | POA: Diagnosis not present

## 2023-02-27 DIAGNOSIS — E782 Mixed hyperlipidemia: Secondary | ICD-10-CM | POA: Diagnosis not present

## 2023-02-27 DIAGNOSIS — I10 Essential (primary) hypertension: Secondary | ICD-10-CM | POA: Diagnosis not present

## 2023-02-27 DIAGNOSIS — E669 Obesity, unspecified: Secondary | ICD-10-CM | POA: Diagnosis not present

## 2023-02-27 MED ORDER — LEVOTHYROXINE SODIUM 137 MCG PO TABS
137.0000 ug | ORAL_TABLET | Freq: Every day | ORAL | 1 refills | Status: DC
  Filled 2023-02-27 – 2023-03-28 (×3): qty 90, 90d supply, fill #0
  Filled 2023-04-25 – 2023-06-24 (×2): qty 90, 90d supply, fill #1

## 2023-02-28 ENCOUNTER — Other Ambulatory Visit (HOSPITAL_BASED_OUTPATIENT_CLINIC_OR_DEPARTMENT_OTHER): Payer: Self-pay

## 2023-03-04 ENCOUNTER — Other Ambulatory Visit (HOSPITAL_BASED_OUTPATIENT_CLINIC_OR_DEPARTMENT_OTHER): Payer: Self-pay

## 2023-03-04 MED ORDER — WEGOVY 0.25 MG/0.5ML ~~LOC~~ SOAJ
0.2500 mg | SUBCUTANEOUS | 0 refills | Status: DC
Start: 2023-03-03 — End: 2023-09-18
  Filled 2023-03-04: qty 2, 28d supply, fill #0

## 2023-03-05 ENCOUNTER — Other Ambulatory Visit (HOSPITAL_BASED_OUTPATIENT_CLINIC_OR_DEPARTMENT_OTHER): Payer: Self-pay

## 2023-03-06 ENCOUNTER — Other Ambulatory Visit (HOSPITAL_BASED_OUTPATIENT_CLINIC_OR_DEPARTMENT_OTHER): Payer: Self-pay

## 2023-03-07 ENCOUNTER — Other Ambulatory Visit (HOSPITAL_BASED_OUTPATIENT_CLINIC_OR_DEPARTMENT_OTHER): Payer: Self-pay

## 2023-03-07 DIAGNOSIS — R310 Gross hematuria: Secondary | ICD-10-CM | POA: Diagnosis not present

## 2023-03-11 ENCOUNTER — Other Ambulatory Visit (HOSPITAL_BASED_OUTPATIENT_CLINIC_OR_DEPARTMENT_OTHER): Payer: Self-pay

## 2023-03-11 MED ORDER — WEGOVY 0.5 MG/0.5ML ~~LOC~~ SOAJ
0.5000 mg | SUBCUTANEOUS | 0 refills | Status: DC
Start: 2023-03-11 — End: 2023-09-18
  Filled 2023-03-11 – 2023-06-11 (×7): qty 2, 28d supply, fill #0

## 2023-03-12 ENCOUNTER — Other Ambulatory Visit (HOSPITAL_BASED_OUTPATIENT_CLINIC_OR_DEPARTMENT_OTHER): Payer: Self-pay

## 2023-03-14 DIAGNOSIS — R300 Dysuria: Secondary | ICD-10-CM | POA: Diagnosis not present

## 2023-03-14 DIAGNOSIS — N342 Other urethritis: Secondary | ICD-10-CM | POA: Diagnosis not present

## 2023-03-15 ENCOUNTER — Other Ambulatory Visit (HOSPITAL_BASED_OUTPATIENT_CLINIC_OR_DEPARTMENT_OTHER): Payer: Self-pay

## 2023-03-21 ENCOUNTER — Other Ambulatory Visit (HOSPITAL_BASED_OUTPATIENT_CLINIC_OR_DEPARTMENT_OTHER): Payer: Self-pay

## 2023-03-22 ENCOUNTER — Other Ambulatory Visit (HOSPITAL_BASED_OUTPATIENT_CLINIC_OR_DEPARTMENT_OTHER): Payer: Self-pay

## 2023-03-22 ENCOUNTER — Other Ambulatory Visit: Payer: Self-pay

## 2023-03-22 MED ORDER — PANTOPRAZOLE SODIUM 40 MG PO TBEC
40.0000 mg | DELAYED_RELEASE_TABLET | Freq: Every day | ORAL | 5 refills | Status: DC
  Filled 2023-04-25: qty 30, 30d supply, fill #0
  Filled 2023-06-10: qty 30, 30d supply, fill #1

## 2023-03-22 MED ORDER — CLONAZEPAM 0.5 MG PO TABS
0.5000 mg | ORAL_TABLET | Freq: Two times a day (BID) | ORAL | 1 refills | Status: DC
  Filled 2023-03-22: qty 45, 23d supply, fill #0
  Filled 2023-04-25: qty 45, 23d supply, fill #1

## 2023-03-22 MED ORDER — HYDROXYZINE HCL 25 MG PO TABS
25.0000 mg | ORAL_TABLET | Freq: Three times a day (TID) | ORAL | 0 refills | Status: DC | PRN
  Filled 2023-03-22: qty 30, 10d supply, fill #0

## 2023-03-25 ENCOUNTER — Other Ambulatory Visit (HOSPITAL_BASED_OUTPATIENT_CLINIC_OR_DEPARTMENT_OTHER): Payer: Self-pay

## 2023-03-25 MED ORDER — METFORMIN HCL ER 500 MG PO TB24
500.0000 mg | ORAL_TABLET | Freq: Every morning | ORAL | 2 refills | Status: DC
  Filled 2023-03-25: qty 30, 30d supply, fill #0
  Filled 2023-04-25: qty 30, 30d supply, fill #1
  Filled 2023-06-10: qty 30, 30d supply, fill #2

## 2023-03-26 ENCOUNTER — Other Ambulatory Visit (HOSPITAL_BASED_OUTPATIENT_CLINIC_OR_DEPARTMENT_OTHER): Payer: Self-pay

## 2023-03-28 ENCOUNTER — Other Ambulatory Visit (HOSPITAL_BASED_OUTPATIENT_CLINIC_OR_DEPARTMENT_OTHER): Payer: Self-pay

## 2023-03-28 ENCOUNTER — Other Ambulatory Visit: Payer: Self-pay

## 2023-03-29 ENCOUNTER — Other Ambulatory Visit (HOSPITAL_BASED_OUTPATIENT_CLINIC_OR_DEPARTMENT_OTHER): Payer: Self-pay

## 2023-03-29 ENCOUNTER — Other Ambulatory Visit: Payer: Self-pay

## 2023-04-01 ENCOUNTER — Other Ambulatory Visit (HOSPITAL_BASED_OUTPATIENT_CLINIC_OR_DEPARTMENT_OTHER): Payer: Self-pay

## 2023-04-01 DIAGNOSIS — Z45018 Encounter for adjustment and management of other part of cardiac pacemaker: Secondary | ICD-10-CM | POA: Diagnosis not present

## 2023-04-01 DIAGNOSIS — I10 Essential (primary) hypertension: Secondary | ICD-10-CM | POA: Diagnosis not present

## 2023-04-01 DIAGNOSIS — G90A Postural orthostatic tachycardia syndrome (POTS): Secondary | ICD-10-CM | POA: Diagnosis not present

## 2023-04-01 DIAGNOSIS — R55 Syncope and collapse: Secondary | ICD-10-CM | POA: Diagnosis not present

## 2023-04-02 ENCOUNTER — Other Ambulatory Visit (HOSPITAL_BASED_OUTPATIENT_CLINIC_OR_DEPARTMENT_OTHER): Payer: Self-pay

## 2023-04-02 NOTE — Progress Notes (Unsigned)
[name] [address] [place] [address] Phone: [phone] Subjective:   Bruce Donath, am serving as a scribe for Dr. Antoine Primas.  I'm seeing this patient by the request  of:  Marthenia Rolling, MD  CC: Neck and back pain follow-up  [id number]  Carmen [name] is a 42 y.o. female coming in with complaint of back and neck pain. OMT on 02/18/2023. Patient states that after last adjustment she developed limited rotation to both L and R. Worse at night and in the morning. Purchased a new pillow but nothing seems to provide her with relief. Hx of occipital neuralgia and has had more of these symptoms recently. Pain near occiput.   Medications patient has been prescribed: Synthoid  Taking:         Reviewed prior external information including notes and imaging from previsou exam, outside providers and external EMR if available.   As well as notes that were available from care everywhere and other healthcare systems.  Past medical history, social, surgical and family history all reviewed in electronic medical record.  No pertanent information unless stated regarding to the chief complaint.   Past Medical History:  Diagnosis Date   Back pain    Costochondritis 09/13/2010   GERD (gastroesophageal reflux disease)    Heart palpitations    long history of    Heartburn    History of chest pain    History of fatigue    History of kidney stones    History of migraine headaches    History of tilt table evaluation    IMPRESSION: Positive tilt table study with significant cardioinhibitory  response.   Hyperlipidemia    Hypertension    Hypothyroidism    Kidney stones    history of   migraine    Neurocardiogenic syncope    Palpitations    Pericarditis 09/13/2010   Presence of permanent cardiac pacemaker    PSVT (paroxysmal supraventricular tachycardia)    Right ureteral calculus    Sleeping difficulties    Stress    Stroke (HCC)  06/15/2019   no residual deficits, tpa given at time of stroke   Stroke [facility])    tia jan 2022   SVD (spontaneous vaginal delivery)    x 1   Syncope and collapse    neurocardiogenic syncopy started age 72   Wears glasses     Allergies  Allergen Reactions   Tape Hives    Adhesive tape - tegaderm/paper tape ok   Prochlorperazine Other (See Comments)    Akathisia with compazine in ED on 05/17/19.  Resolved with repeat dose of benadryl.      Quinolones Nausea Only   Reglan [Metoclopramide] Other (See Comments)    flinging legs and "not feeling right".      Review of Systems:  No  visual changes, nausea, vomiting, diarrhea, constipation, dizziness, abdominal pain, skin rash, fevers, chills, night sweats, weight loss, swollen lymph nodes,  joint swelling, chest pain, shortness of breath, mood changes. POSITIVE muscle aches, body aches, headache  Objective  Blood pressure 112/88, pulse 87, height 5\' 2"  (1.575 m), weight 186 lb (84.4 kg), SpO2 98%.   General: No apparent distress alert and oriented x3 mood and affect normal, dressed appropriately.  HEENT: Pupils equal, extraocular movements intact  Respiratory: Patient's speak in full sentences and does not appear short of breath  Cardiovascular: No lower extremity edema, non tender, no erythema  Neck exam does have some loss lordosis  noted.  Some tenderness to palpation of the paraspinal musculature.  Patient does have some limitation with sidebending bilaterally.  Bruising noted over the left trapezius.  Multiple trigger points still noted in the left and right shoulder regions.  Osteopathic findings  C2 flexed rotated and side bent right C6 flexed rotated and side bent left T3 extended rotated and side bent right inhaled rib T7 extended rotated and side bent left L3 flexed rotated and side bent right Sacrum right on right       Assessment and Plan:  Neck muscle spasm Continues to have more muscle spasms noted.  Discussed  icing regimen and home exercises, discussed avoiding certain activities.  Patient will increase activity slowly.  Follow-up with me again in 6 to 8 weeks otherwise.  Given note for standing desk   Nonallopathic problems  Decision today to treat with OMT was based on Physical Exam  After verbal consent patient was treated with HVLA, ME, FPR techniques in cervical, rib, thoracic, lumbar, and sacral  areas  Patient tolerated the procedure well with improvement in symptoms  Patient given exercises, stretches and lifestyle modifications  See medications in patient instructions if given  Patient will follow up in 4-8 weeks    The above documentation has been reviewed and is accurate and complete [name], DO          Note: This dictation was prepared with Dragon dictation along with smaller phrase technology. Any transcriptional errors that result from this process are unintentional.

## 2023-04-03 ENCOUNTER — Ambulatory Visit (INDEPENDENT_AMBULATORY_CARE_PROVIDER_SITE_OTHER): Payer: Commercial Managed Care - PPO | Admitting: Family Medicine

## 2023-04-03 ENCOUNTER — Encounter: Payer: Self-pay | Admitting: Family Medicine

## 2023-04-03 VITALS — BP 112/88 | HR 87 | Ht 62.0 in | Wt 186.0 lb

## 2023-04-03 DIAGNOSIS — M9904 Segmental and somatic dysfunction of sacral region: Secondary | ICD-10-CM

## 2023-04-03 DIAGNOSIS — M9908 Segmental and somatic dysfunction of rib cage: Secondary | ICD-10-CM | POA: Diagnosis not present

## 2023-04-03 DIAGNOSIS — M9901 Segmental and somatic dysfunction of cervical region: Secondary | ICD-10-CM

## 2023-04-03 DIAGNOSIS — M62838 Other muscle spasm: Secondary | ICD-10-CM | POA: Diagnosis not present

## 2023-04-03 DIAGNOSIS — M9903 Segmental and somatic dysfunction of lumbar region: Secondary | ICD-10-CM | POA: Diagnosis not present

## 2023-04-03 DIAGNOSIS — M9902 Segmental and somatic dysfunction of thoracic region: Secondary | ICD-10-CM

## 2023-04-03 NOTE — Assessment & Plan Note (Signed)
Continues to have more muscle spasms noted.  Discussed icing regimen and home exercises, discussed avoiding certain activities.  Patient will increase activity slowly.  Follow-up with me again in 6 to 8 weeks otherwise.

## 2023-04-03 NOTE — Patient Instructions (Signed)
Adjustable standing desk See me in 5-6 weeks

## 2023-04-04 ENCOUNTER — Encounter: Payer: Self-pay | Admitting: Family Medicine

## 2023-04-04 ENCOUNTER — Other Ambulatory Visit (HOSPITAL_BASED_OUTPATIENT_CLINIC_OR_DEPARTMENT_OTHER): Payer: Self-pay

## 2023-04-05 ENCOUNTER — Other Ambulatory Visit (HOSPITAL_BASED_OUTPATIENT_CLINIC_OR_DEPARTMENT_OTHER): Payer: Self-pay

## 2023-04-08 ENCOUNTER — Other Ambulatory Visit (HOSPITAL_BASED_OUTPATIENT_CLINIC_OR_DEPARTMENT_OTHER): Payer: Self-pay

## 2023-04-08 DIAGNOSIS — Z01419 Encounter for gynecological examination (general) (routine) without abnormal findings: Secondary | ICD-10-CM | POA: Diagnosis not present

## 2023-04-08 DIAGNOSIS — Z6833 Body mass index (BMI) 33.0-33.9, adult: Secondary | ICD-10-CM | POA: Diagnosis not present

## 2023-04-08 MED ORDER — ESTRADIOL 0.1 MG/GM VA CREA
1.0000 g | TOPICAL_CREAM | VAGINAL | 6 refills | Status: DC
  Filled 2023-04-08: qty 42.5, 90d supply, fill #0

## 2023-04-08 MED ORDER — CLOTRIMAZOLE-BETAMETHASONE 1-0.05 % EX CREA
1.0000 | TOPICAL_CREAM | Freq: Two times a day (BID) | CUTANEOUS | 6 refills | Status: DC
  Filled 2023-04-08: qty 15, 8d supply, fill #0
  Filled 2023-06-10: qty 15, 8d supply, fill #1
  Filled 2024-03-18: qty 15, 8d supply, fill #2

## 2023-04-10 ENCOUNTER — Other Ambulatory Visit (HOSPITAL_BASED_OUTPATIENT_CLINIC_OR_DEPARTMENT_OTHER): Payer: Self-pay

## 2023-04-11 ENCOUNTER — Encounter (HOSPITAL_BASED_OUTPATIENT_CLINIC_OR_DEPARTMENT_OTHER): Payer: Self-pay

## 2023-04-11 ENCOUNTER — Other Ambulatory Visit (HOSPITAL_BASED_OUTPATIENT_CLINIC_OR_DEPARTMENT_OTHER): Payer: Self-pay

## 2023-04-11 DIAGNOSIS — E669 Obesity, unspecified: Secondary | ICD-10-CM | POA: Diagnosis not present

## 2023-04-11 DIAGNOSIS — E782 Mixed hyperlipidemia: Secondary | ICD-10-CM | POA: Diagnosis not present

## 2023-04-11 DIAGNOSIS — I10 Essential (primary) hypertension: Secondary | ICD-10-CM | POA: Diagnosis not present

## 2023-04-11 DIAGNOSIS — E559 Vitamin D deficiency, unspecified: Secondary | ICD-10-CM | POA: Diagnosis not present

## 2023-04-11 DIAGNOSIS — Z6832 Body mass index (BMI) 32.0-32.9, adult: Secondary | ICD-10-CM | POA: Diagnosis not present

## 2023-04-12 ENCOUNTER — Other Ambulatory Visit (HOSPITAL_BASED_OUTPATIENT_CLINIC_OR_DEPARTMENT_OTHER): Payer: Self-pay

## 2023-04-15 ENCOUNTER — Other Ambulatory Visit (HOSPITAL_BASED_OUTPATIENT_CLINIC_OR_DEPARTMENT_OTHER): Payer: Self-pay

## 2023-04-17 ENCOUNTER — Other Ambulatory Visit (HOSPITAL_BASED_OUTPATIENT_CLINIC_OR_DEPARTMENT_OTHER): Payer: Self-pay

## 2023-04-18 ENCOUNTER — Other Ambulatory Visit (HOSPITAL_BASED_OUTPATIENT_CLINIC_OR_DEPARTMENT_OTHER): Payer: Self-pay

## 2023-04-19 ENCOUNTER — Other Ambulatory Visit (HOSPITAL_BASED_OUTPATIENT_CLINIC_OR_DEPARTMENT_OTHER): Payer: Self-pay

## 2023-04-22 ENCOUNTER — Other Ambulatory Visit (HOSPITAL_BASED_OUTPATIENT_CLINIC_OR_DEPARTMENT_OTHER): Payer: Self-pay

## 2023-04-23 ENCOUNTER — Other Ambulatory Visit (HOSPITAL_COMMUNITY): Payer: Self-pay

## 2023-04-23 ENCOUNTER — Other Ambulatory Visit: Payer: Self-pay

## 2023-04-23 ENCOUNTER — Other Ambulatory Visit (HOSPITAL_BASED_OUTPATIENT_CLINIC_OR_DEPARTMENT_OTHER): Payer: Self-pay

## 2023-04-25 ENCOUNTER — Other Ambulatory Visit (HOSPITAL_BASED_OUTPATIENT_CLINIC_OR_DEPARTMENT_OTHER): Payer: Self-pay

## 2023-04-25 ENCOUNTER — Ambulatory Visit: Payer: Managed Care, Other (non HMO) | Admitting: Family Medicine

## 2023-04-25 ENCOUNTER — Other Ambulatory Visit: Payer: Self-pay

## 2023-04-25 MED ORDER — BUPROPION HCL ER (XL) 300 MG PO TB24
300.0000 mg | ORAL_TABLET | Freq: Every morning | ORAL | 2 refills | Status: DC
  Filled 2023-04-25: qty 30, 30d supply, fill #0
  Filled 2023-06-10: qty 30, 30d supply, fill #1

## 2023-04-25 MED ORDER — METOPROLOL SUCCINATE ER 50 MG PO TB24
ORAL_TABLET | ORAL | 3 refills | Status: DC
  Filled 2023-04-25: qty 135, 45d supply, fill #0
  Filled 2023-06-10: qty 135, 45d supply, fill #1
  Filled 2024-03-01: qty 135, 45d supply, fill #2

## 2023-04-25 MED ORDER — SUMATRIPTAN SUCCINATE 100 MG PO TABS
100.0000 mg | ORAL_TABLET | Freq: Every day | ORAL | 2 refills | Status: DC | PRN
  Filled 2023-04-25: qty 9, 15d supply, fill #0
  Filled 2023-06-10: qty 9, 15d supply, fill #1
  Filled 2023-09-03: qty 9, 15d supply, fill #2

## 2023-04-25 MED ORDER — ESZOPICLONE 2 MG PO TABS
2.0000 mg | ORAL_TABLET | Freq: Every day | ORAL | 2 refills | Status: DC
  Filled 2023-04-25 – 2023-05-16 (×2): qty 30, 30d supply, fill #0
  Filled 2023-06-10: qty 30, 30d supply, fill #1
  Filled 2023-07-23: qty 30, 30d supply, fill #2

## 2023-04-25 MED ORDER — HYDROXYZINE HCL 25 MG PO TABS
25.0000 mg | ORAL_TABLET | Freq: Three times a day (TID) | ORAL | 0 refills | Status: DC
  Filled 2023-04-25: qty 30, 10d supply, fill #0

## 2023-05-07 NOTE — Progress Notes (Unsigned)
Carmen [name] [address] [place] [address] Phone: [phone] Subjective:   Carmen [name], am serving as a scribe for Dr. Antoine Primas.  I'm seeing this patient by the request  of:  Marthenia Rolling, MD  CC: Back and neck pain follow-up  [id number]  Carmen [name] is a 42 y.o. female coming in with complaint of back and neck pain. OMT on 03/05/2023. Patient states that she is having pain on R side of her neck. Pain flares up from day to day.   Medications patient has been prescribed:   Taking:         Reviewed prior external information including notes and imaging from previsou exam, outside providers and external EMR if available.   As well as notes that were available from care everywhere and other healthcare systems.  Past medical history, social, surgical and family history all reviewed in electronic medical record.  No pertanent information unless stated regarding to the chief complaint.   Past Medical History:  Diagnosis Date   Back pain    Costochondritis 09/13/2010   GERD (gastroesophageal reflux disease)    Heart palpitations    long history of    Heartburn    History of chest pain    History of fatigue    History of kidney stones    History of migraine headaches    History of tilt table evaluation    IMPRESSION: Positive tilt table study with significant cardioinhibitory  response.   Hyperlipidemia    Hypertension    Hypothyroidism    Kidney stones    history of   migraine    Neurocardiogenic syncope    Palpitations    Pericarditis 09/13/2010   Presence of permanent cardiac pacemaker    PSVT (paroxysmal supraventricular tachycardia) (HCC)    Right ureteral calculus    Sleeping difficulties    Stress    Stroke (HCC) 06/15/2019   no residual deficits, tpa given at time of stroke   Stroke [facility])    tia jan 2022   SVD (spontaneous vaginal delivery)    x 1   Syncope and collapse    neurocardiogenic  syncopy started age 47   Wears glasses     Allergies  Allergen Reactions   Tape Hives    Adhesive tape - tegaderm/paper tape ok   Prochlorperazine Other (See Comments)    Akathisia with compazine in ED on 05/17/19.  Resolved with repeat dose of benadryl.      Quinolones Nausea Only   Reglan [Metoclopramide] Other (See Comments)    flinging legs and "not feeling right".      Review of Systems:  No headache, visual changes, nausea, vomiting, diarrhea, constipation, dizziness, abdominal pain, skin rash, fevers, chills, night sweats, weight loss, swollen lymph nodes, body aches, joint swelling, chest pain, shortness of breath, mood changes. POSITIVE muscle aches  Objective  Blood pressure 124/86, pulse 83, height 5\' 2"  (1.575 m), weight 185 lb (83.9 kg), SpO2 98%.   General: No apparent distress alert and oriented x3 mood and affect normal, dressed appropriately.  HEENT: Pupils equal, extraocular movements intact  Respiratory: Patient's speak in full sentences and does not appear short of breath  Cardiovascular: No lower extremity edema, non tender, no erythema  Neck exam continues to have tightness noted.  Seems to be on the left greater than right.  Patient does have some limited range of motion noted as well.  Patient does have positive Spurling's on the  left side.  Radicular symptoms into the shoulder.  Symptoms actually go bilateral which seems to be different and worse.      Assessment and Plan:    The above documentation has been reviewed and is accurate and complete [name], DO          Note: This dictation was prepared with Dragon dictation along with smaller phrase technology. Any transcriptional errors that result from this process are unintentional.

## 2023-05-09 ENCOUNTER — Other Ambulatory Visit (HOSPITAL_COMMUNITY): Payer: Self-pay

## 2023-05-09 ENCOUNTER — Ambulatory Visit (INDEPENDENT_AMBULATORY_CARE_PROVIDER_SITE_OTHER): Payer: Commercial Managed Care - PPO

## 2023-05-09 ENCOUNTER — Other Ambulatory Visit (HOSPITAL_BASED_OUTPATIENT_CLINIC_OR_DEPARTMENT_OTHER): Payer: Self-pay

## 2023-05-09 ENCOUNTER — Encounter: Payer: Self-pay | Admitting: Family Medicine

## 2023-05-09 ENCOUNTER — Ambulatory Visit: Payer: Commercial Managed Care - PPO | Admitting: Family Medicine

## 2023-05-09 VITALS — BP 124/86 | HR 83 | Ht 62.0 in | Wt 185.0 lb

## 2023-05-09 DIAGNOSIS — M542 Cervicalgia: Secondary | ICD-10-CM

## 2023-05-09 DIAGNOSIS — M62838 Other muscle spasm: Secondary | ICD-10-CM

## 2023-05-09 DIAGNOSIS — M25511 Pain in right shoulder: Secondary | ICD-10-CM | POA: Diagnosis not present

## 2023-05-09 DIAGNOSIS — G8929 Other chronic pain: Secondary | ICD-10-CM | POA: Diagnosis not present

## 2023-05-09 DIAGNOSIS — Z6832 Body mass index (BMI) 32.0-32.9, adult: Secondary | ICD-10-CM | POA: Diagnosis not present

## 2023-05-09 DIAGNOSIS — E559 Vitamin D deficiency, unspecified: Secondary | ICD-10-CM | POA: Diagnosis not present

## 2023-05-09 DIAGNOSIS — E669 Obesity, unspecified: Secondary | ICD-10-CM | POA: Diagnosis not present

## 2023-05-09 DIAGNOSIS — I10 Essential (primary) hypertension: Secondary | ICD-10-CM | POA: Diagnosis not present

## 2023-05-09 DIAGNOSIS — E782 Mixed hyperlipidemia: Secondary | ICD-10-CM | POA: Diagnosis not present

## 2023-05-09 MED ORDER — WEGOVY 0.5 MG/0.5ML ~~LOC~~ SOAJ
0.5000 mg | SUBCUTANEOUS | 0 refills | Status: DC
  Filled 2023-05-09 – 2023-05-16 (×5): qty 2, 28d supply, fill #0

## 2023-05-09 MED ORDER — WEGOVY 1 MG/0.5ML ~~LOC~~ SOAJ
1.0000 mg | SUBCUTANEOUS | 0 refills | Status: DC
  Filled 2023-05-16 – 2023-05-29 (×3): qty 2, 28d supply, fill #0

## 2023-05-09 MED ORDER — DIAZEPAM 5 MG PO TABS
ORAL_TABLET | ORAL | 0 refills | Status: DC
  Filled 2023-05-09: qty 2, 1d supply, fill #0

## 2023-05-09 NOTE — Assessment & Plan Note (Signed)
Recurrent neck spasms associated with headache and having difficulty for quite some time.  No longer responding to medications, home exercises, formal physical therapy, trigger point injections, over-the-counter medications and icing regimen.  I do believe at this point that further evaluation is necessary.  Would like an MRI of the cervical spine to further evaluate to see if there is any nerve impingement, syrinx development that could be potentially contributing to her aches and pains.  Follow-up with me after the imaging to discuss further.  Valium sent in, patient does get other medications from primary care provider in the same family and the patient does know not to mix them.  She will only use the Valium before the imaging.

## 2023-05-09 NOTE — Patient Instructions (Addendum)
MRI Cervical  We will be in touch Look into seasonal affect disorder light

## 2023-05-16 ENCOUNTER — Other Ambulatory Visit (HOSPITAL_COMMUNITY): Payer: Self-pay

## 2023-05-16 ENCOUNTER — Other Ambulatory Visit (HOSPITAL_BASED_OUTPATIENT_CLINIC_OR_DEPARTMENT_OTHER): Payer: Self-pay

## 2023-05-16 ENCOUNTER — Other Ambulatory Visit: Payer: Self-pay

## 2023-05-20 ENCOUNTER — Other Ambulatory Visit (HOSPITAL_BASED_OUTPATIENT_CLINIC_OR_DEPARTMENT_OTHER): Payer: Self-pay

## 2023-05-21 ENCOUNTER — Other Ambulatory Visit (HOSPITAL_BASED_OUTPATIENT_CLINIC_OR_DEPARTMENT_OTHER): Payer: Self-pay

## 2023-05-29 ENCOUNTER — Other Ambulatory Visit (HOSPITAL_BASED_OUTPATIENT_CLINIC_OR_DEPARTMENT_OTHER): Payer: Self-pay

## 2023-05-29 DIAGNOSIS — I10 Essential (primary) hypertension: Secondary | ICD-10-CM | POA: Diagnosis not present

## 2023-05-29 DIAGNOSIS — E785 Hyperlipidemia, unspecified: Secondary | ICD-10-CM | POA: Diagnosis not present

## 2023-05-29 DIAGNOSIS — Z79890 Hormone replacement therapy (postmenopausal): Secondary | ICD-10-CM | POA: Diagnosis not present

## 2023-05-29 DIAGNOSIS — E039 Hypothyroidism, unspecified: Secondary | ICD-10-CM | POA: Diagnosis not present

## 2023-05-29 MED ORDER — ESTRADIOL 0.025 MG/24HR TD PTTW
1.0000 | MEDICATED_PATCH | TRANSDERMAL | 2 refills | Status: DC
  Filled 2023-05-29: qty 8, 28d supply, fill #0
  Filled 2023-06-24: qty 8, 28d supply, fill #1
  Filled 2023-07-23: qty 8, 28d supply, fill #2

## 2023-05-29 MED ORDER — HYDROXYZINE HCL 25 MG PO TABS
25.0000 mg | ORAL_TABLET | Freq: Every day | ORAL | 0 refills | Status: DC
  Filled 2023-05-29: qty 90, 90d supply, fill #0

## 2023-06-03 ENCOUNTER — Other Ambulatory Visit (HOSPITAL_BASED_OUTPATIENT_CLINIC_OR_DEPARTMENT_OTHER): Payer: Self-pay

## 2023-06-10 ENCOUNTER — Other Ambulatory Visit (HOSPITAL_BASED_OUTPATIENT_CLINIC_OR_DEPARTMENT_OTHER): Payer: Self-pay

## 2023-06-11 ENCOUNTER — Other Ambulatory Visit: Payer: Self-pay

## 2023-06-11 ENCOUNTER — Other Ambulatory Visit (HOSPITAL_BASED_OUTPATIENT_CLINIC_OR_DEPARTMENT_OTHER): Payer: Self-pay

## 2023-06-11 MED ORDER — CLONAZEPAM 0.5 MG PO TABS
0.5000 mg | ORAL_TABLET | Freq: Two times a day (BID) | ORAL | 1 refills | Status: DC
  Filled 2023-06-11: qty 45, 23d supply, fill #0
  Filled 2023-07-04: qty 45, 23d supply, fill #1

## 2023-06-11 MED ORDER — NURTEC 75 MG PO TBDP
75.0000 mg | ORAL_TABLET | Freq: Every day | ORAL | 3 refills | Status: DC | PRN
  Filled 2023-06-11: qty 8, 16d supply, fill #0
  Filled 2023-07-23: qty 8, 16d supply, fill #1
  Filled 2023-09-03: qty 8, 16d supply, fill #2
  Filled 2023-11-17: qty 8, 16d supply, fill #3

## 2023-06-12 ENCOUNTER — Other Ambulatory Visit (HOSPITAL_BASED_OUTPATIENT_CLINIC_OR_DEPARTMENT_OTHER): Payer: Self-pay

## 2023-06-13 ENCOUNTER — Other Ambulatory Visit (HOSPITAL_BASED_OUTPATIENT_CLINIC_OR_DEPARTMENT_OTHER): Payer: Self-pay

## 2023-06-13 DIAGNOSIS — E782 Mixed hyperlipidemia: Secondary | ICD-10-CM | POA: Diagnosis not present

## 2023-06-13 DIAGNOSIS — E559 Vitamin D deficiency, unspecified: Secondary | ICD-10-CM | POA: Diagnosis not present

## 2023-06-13 DIAGNOSIS — Z6832 Body mass index (BMI) 32.0-32.9, adult: Secondary | ICD-10-CM | POA: Diagnosis not present

## 2023-06-13 DIAGNOSIS — I10 Essential (primary) hypertension: Secondary | ICD-10-CM | POA: Diagnosis not present

## 2023-06-13 DIAGNOSIS — E669 Obesity, unspecified: Secondary | ICD-10-CM | POA: Diagnosis not present

## 2023-06-13 MED ORDER — WEGOVY 1.7 MG/0.75ML ~~LOC~~ SOAJ
1.7000 mg | SUBCUTANEOUS | 1 refills | Status: DC
  Filled 2023-06-13 – 2023-06-24 (×2): qty 3, 28d supply, fill #0
  Filled 2023-07-19: qty 3, 28d supply, fill #1

## 2023-06-24 ENCOUNTER — Other Ambulatory Visit (HOSPITAL_BASED_OUTPATIENT_CLINIC_OR_DEPARTMENT_OTHER): Payer: Self-pay

## 2023-06-24 MED ORDER — BUPROPION HCL ER (XL) 150 MG PO TB24
150.0000 mg | ORAL_TABLET | Freq: Every day | ORAL | 1 refills | Status: DC
  Filled 2023-06-24: qty 30, 30d supply, fill #0
  Filled 2023-07-23: qty 30, 30d supply, fill #1

## 2023-06-24 MED ORDER — PANTOPRAZOLE SODIUM 40 MG PO TBEC
40.0000 mg | DELAYED_RELEASE_TABLET | Freq: Every day | ORAL | 3 refills | Status: AC
  Filled 2023-06-24 – 2023-07-04 (×2): qty 90, 90d supply, fill #0
  Filled 2024-03-18: qty 90, 90d supply, fill #1
  Filled 2024-06-20: qty 90, 90d supply, fill #2

## 2023-07-04 ENCOUNTER — Other Ambulatory Visit (HOSPITAL_BASED_OUTPATIENT_CLINIC_OR_DEPARTMENT_OTHER): Payer: Self-pay

## 2023-07-05 ENCOUNTER — Other Ambulatory Visit: Payer: Self-pay

## 2023-07-09 ENCOUNTER — Ambulatory Visit (HOSPITAL_COMMUNITY): Admission: RE | Admit: 2023-07-09 | Payer: Commercial Managed Care - PPO | Source: Ambulatory Visit

## 2023-07-12 ENCOUNTER — Other Ambulatory Visit (HOSPITAL_BASED_OUTPATIENT_CLINIC_OR_DEPARTMENT_OTHER): Payer: Self-pay

## 2023-07-12 ENCOUNTER — Other Ambulatory Visit: Payer: Self-pay

## 2023-07-12 MED ORDER — ROPINIROLE HCL 0.25 MG PO TABS
0.2500 mg | ORAL_TABLET | Freq: Three times a day (TID) | ORAL | 2 refills | Status: AC
  Filled 2023-07-12: qty 90, 30d supply, fill #0
  Filled 2024-02-06: qty 90, 30d supply, fill #1

## 2023-07-12 NOTE — Progress Notes (Unsigned)
[name] [address] [place] [address] Phone: [phone] Subjective:   Bruce Donath, am serving as a scribe for Dr. Antoine Primas.  I'm seeing this patient by the request  of:  Marthenia Rolling, MD  CC: Back and neck pain  [id number]  05/09/2023 Recurrent neck spasms associated with headache and having difficulty for quite some time.  No longer responding to medications, home exercises, formal physical therapy, trigger point injections, over-the-counter medications and icing regimen.  I do believe at this point that further evaluation is necessary.  Would like an MRI of the cervical spine to further evaluate to see if there is any nerve impingement, syrinx development that could be potentially contributing to her aches and pains.  Follow-up with me after the imaging to discuss further.  Valium sent in, patient does get other medications from primary care provider in the same family and the patient does know not to mix them.  She will only use the Valium before the imaging.   Updated 07/16/2023 KEILEN DYKE is a 43 y.o. female coming in with complaint of neck pain patient was scheduled to get an MRI of the neck.  Never did completely get that done Not having neck pain at this. No longer having shoulder pain. Pain is in between scapula. R>L. Here for injections. Feels there is a big knot below inferior angle of scapula.   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      Past Medical History:  Diagnosis Date   Back pain    Costochondritis 09/13/2010   GERD (gastroesophageal reflux disease)    Heart palpitations    long history of    Heartburn    History of chest pain    History of fatigue    History of kidney stones    History of migraine headaches    History of tilt table evaluation    IMPRESSION: Positive tilt table study with significant cardioinhibitory  response.   Hyperlipidemia    Hypertension    Hypothyroidism    Kidney  stones    history of   migraine    Neurocardiogenic syncope    Palpitations    Pericarditis 09/13/2010   Presence of permanent cardiac pacemaker    PSVT (paroxysmal supraventricular tachycardia) (HCC)    Right ureteral calculus    Sleeping difficulties    Stress    Stroke (HCC) 06/15/2019   no residual deficits, tpa given at time of stroke   Stroke [facility])    tia jan 2022   SVD (spontaneous vaginal delivery)    x 1   Syncope and collapse    neurocardiogenic syncopy started age 74   Wears glasses    Past Surgical History:  Procedure Laterality Date   ANTERIOR AND POSTERIOR REPAIR N/A 11/14/2017   Procedure: ANTERIOR (CYSTOCELE) AND POSTERIOR REPAIR (RECTOCELE);  Surgeon: Silverio Lay, MD;  Location: [facility];  Service: Gynecology;  Laterality: N/A;   BILATERAL SALPINGECTOMY Bilateral 11/14/2017   Procedure: BILATERAL SALPINGECTOMY;  Surgeon: Silverio Lay, MD;  Location: [facility];  Service: Gynecology;  Laterality: Bilateral;   BLADDER SUSPENSION Bilateral 11/14/2017   Procedure: TRANSVAGINAL TAPE (TVT) PROCEDURE;  Surgeon: Osborn Coho, MD;  Location: [facility];  Service: Gynecology;  Laterality: Bilateral;   CHOLECYSTECTOMY  11/2007   Cholecystitis - Laparoscopic cholecystectomy with intraoperative  cholangiogram.   CYSTOSCOPY N/A 11/14/2017   Procedure: CYSTOSCOPY;  Surgeon: Osborn Coho, MD;  Location: [facility];  Service: Gynecology;  Laterality: N/A;  CYSTOSCOPY WITH RETROGRADE PYELOGRAM, URETEROSCOPY AND STENT PLACEMENT Right 06/25/2019   Procedure: CYSTOSCOPY WITH RETROGRADE PYELOGRAM, AND RIGHT  STENT PLACEMENT;  Surgeon: Noel Christmas, MD;  Location: [facility];  Service: Urology;  Laterality: Right;   CYSTOSCOPY WITH RETROGRADE PYELOGRAM, URETEROSCOPY AND STENT PLACEMENT Right 07/07/2019   Procedure: CYSTOSCOPY , URETEROSCOPY AND STENT REPLACEMENT;  Surgeon: Noel Christmas, MD;  [facility];  Service: Urology;  Laterality: Right;   CYSTOSCOPY  WITH RETROGRADE PYELOGRAM, URETEROSCOPY AND STENT PLACEMENT Right 03/23/2021   Procedure: CYSTOSCOPY WITH RETROGRADE PYELOGRAM, URETEROSCOPY AND STENT PLACEMENT;  Surgeon: Marcine Matar, MD;  Location: [facility];  Service: Urology;  Laterality: Right;   CYSTOSCOPY/URETEROSCOPY/HOLMIUM LASER/STENT PLACEMENT Left 07/05/2022   Procedure: CYSTOSCOPY LEFT RETROGRADE PYELOGRAM URETEROSCOPY/HOLMIUM LASER/STENT PLACEMENT;  Surgeon: Jerilee Field, MD;  Location: [facility];  Service: Urology;  Laterality: Left;   HOLMIUM LASER APPLICATION Right 07/07/2019   Procedure: HOLMIUM LASER APPLICATION;  Surgeon: Noel Christmas, MD;  Location: [facility];  Service: Urology;  Laterality: Right;   HOLMIUM LASER APPLICATION Right 03/23/2021   Procedure: HOLMIUM LASER APPLICATION;  Surgeon: Marcine Matar, MD;  Location: [facility];  Service: Urology;  Laterality: Right;   PACEMAKER INSERTION  2016   Tilt Table Study  07/17/2005   Positive tilt table study with significant cardioinhibitory  response.   VAGINAL HYSTERECTOMY N/A 11/14/2017   Procedure: HYSTERECTOMY VAGINAL;  Surgeon: Silverio Lay, MD;  Location: [facility];  Service: Gynecology;  Laterality: N/A;   WISDOM TOOTH EXTRACTION     age 74   Social History   Socioeconomic History   Marital status: Married    Spouse name: Not on file   Number of children: Not on file   Years of education: Not on file   Highest education level: Not on file  Occupational History   Occupation: mammography tech  Tobacco Use   Smoking status: Never   Smokeless tobacco: Never  Vaping Use   Vaping status: Never Used  Substance and Sexual Activity   Alcohol use: Yes    Comment: very rare   Drug use: Never   Sexual activity: Not on file  Other Topics Concern   Not on file  Social History Narrative   Not on file   Social Drivers of Health   Financial Resource Strain: Low Risk  (01/15/2023)   Received from  [facility]   Overall Financial Resource Strain (CARDIA)    Difficulty of Paying Living Expenses: Not very hard  Food Insecurity: No Food Insecurity (01/15/2023)   Received from [facility]   Hunger Vital Sign    Worried About Running Out of Food in the Last Year: Never true    Ran Out of Food in the Last Year: Never true  Transportation Needs: No Transportation Needs (01/15/2023)   Received from [facility] - Transportation    Lack of Transportation (Medical): No    Lack of Transportation (Non-Medical): No  Physical Activity: Insufficiently Active (01/15/2023)   Received from [facility]   Exercise Vital Sign    Days of Exercise per Week: 2 days    Minutes of Exercise per Session: 40 min  Stress: No Stress Concern Present (01/15/2023)   Received from [facility] - Occupational Stress Questionnaire    Feeling of Stress : Only a little  Social Connections: Somewhat Isolated (01/15/2023)   Received from [facility]   Social Network  How would you rate your social network (family, work, friends)?: Restricted participation with some degree of social isolation   Allergies  Allergen Reactions   Tape Hives    Adhesive tape - tegaderm/paper tape ok   Prochlorperazine Other (See Comments)    Akathisia with compazine in ED on 05/17/19.  Resolved with repeat dose of benadryl.      Quinolones Nausea Only   Reglan [Metoclopramide] Other (See Comments)    flinging legs and "not feeling right".    Family History  Problem Relation Age of Onset   Hypertension Mother    Heart disease Mother    Cancer Mother        breast   Breast cancer Mother 71   Diabetes Mother    Hyperlipidemia Mother    Thyroid disease Mother    Depression Mother    Anxiety disorder Mother    Bipolar disorder Mother    Liver disease Mother    Alcoholism Mother    Obesity Mother    Heart attack Father    Hyperlipidemia Father    Hepatitis C Father     Diabetes Father    Hypertension Father    Heart disease Father    Sudden Cardiac Death Father    Depression Father    Anxiety disorder Father    Liver disease Father    Alcoholism Father    Drug abuse Father    Obesity Father    Cancer Maternal Aunt        Breast   Breast cancer Maternal Aunt 75   Breast cancer Maternal Grandmother 30    Current Outpatient Medications (Endocrine & Metabolic):    estradiol (VIVELLE-DOT) 0.025 MG/24HR, Place 1 patch onto the skin 2 (two) times a week.   levothyroxine (SYNTHROID) 137 MCG tablet, Take 1 tablet (137 mcg total) by mouth daily before breakfast.   metFORMIN (GLUCOPHAGE-XR) 500 MG 24 hr tablet, Take 1 tablet (500 mg total) by mouth every morning with a meal.  Current Outpatient Medications (Cardiovascular):    metoprolol succinate (TOPROL-XL) 100 MG 24 hr tablet, Take one tablet (100 mg dose) by mouth daily. (Patient taking differently: Take 100 mg by mouth at bedtime.)   metoprolol succinate (TOPROL-XL) 50 MG 24 hr tablet, Take 1 tablet (50 mg total) by mouth every morning AND 2 tablets (100 mg total) Nightly.   metoprolol succinate (TOPROL-XL) 50 MG 24 hr tablet, Take 2 tablets (100 mg total) by mouth in the morning AND 1 tablet (50 mg total) every evening.   metoprolol tartrate (LOPRESSOR) 25 MG tablet, Take 1 tablet (25 mg total) by mouth 2 (two) times daily.  Current Outpatient Medications (Respiratory):    promethazine (PHENERGAN) 25 MG tablet, Take 1 tablet (25 mg total) by mouth every 6 (six) hours as needed for nausea or vomiting.  Current Outpatient Medications (Analgesics):    aspirin EC 81 MG tablet, Take 81 mg by mouth daily. Swallow whole.   meloxicam (MOBIC) 15 MG tablet, Take 1 tablet (15 mg total) by mouth daily.   Rimegepant Sulfate (NURTEC) 75 MG TBDP, Take 1 tablet (75 mg total) by mouth daily as needed.   SUMAtriptan (IMITREX) 100 MG tablet, Take 1 tablet by mouth once daily as needed for migraine. May repeat ONCE in  2 hours.   Current Outpatient Medications (Other):    buPROPion (WELLBUTRIN XL) 150 MG 24 hr tablet, Take 1 tablet (150 mg total) by mouth in the morning.   buPROPion (WELLBUTRIN XL) 150 MG 24  hr tablet, Take 1 tablet (150 mg total) by mouth daily.   buPROPion (WELLBUTRIN XL) 300 MG 24 hr tablet, Take 1 tablet (300 mg total) by mouth every morning.   busPIRone (BUSPAR) 10 MG tablet, Take 2 tablets (20 mg total) by mouth at bedtime.   busPIRone (BUSPAR) 10 MG tablet, Take two tablets (20 mg dose) by mouth 2 (two) times daily.   cephALEXin (KEFLEX) 500 MG capsule, Take 1 capsule (500 mg total) by mouth 2 (two) times daily.   ciprofloxacin (CIPRO) 500 MG tablet, Take 1 tablet (500 mg total) by mouth 2 (two) times daily.   clonazePAM (KLONOPIN) 0.5 MG tablet, Take 1 tablet (0.5 mg total) by mouth 2 (two) times daily. Max 2 tablets daily.   clotrimazole-betamethasone (LOTRISONE) cream, APPLY TO THE AFFECTED AND SURROUNDING AREAS OF SKIN BY TOPICAL ROUTE 2 TIMES PER DAY IN THE MORNING AND EVENING FOR 2 WEEKS   cyclobenzaprine (FLEXERIL) 5 MG tablet, Take 1 tablet (5 mg total) by mouth at bedtime as needed for muscle spasms.   desvenlafaxine (PRISTIQ) 50 MG 24 hr tablet, Take 1 tablet (50 mg total) by mouth daily.   diazepam (VALIUM) 5 MG tablet, One tab by mouth, 2 hours before procedure.   estradiol (ESTRACE) 0.1 MG/GM vaginal cream, Place 1 g vaginally 3 (three) times a week at bedtime.   eszopiclone (LUNESTA) 2 MG TABS tablet, Take 1 tablet (2 mg total) by mouth at bedtime.   gabapentin (NEURONTIN) 100 MG capsule, Take 2 capsules (200 mg total) by mouth at bedtime.   hydrOXYzine (ATARAX) 25 MG tablet, Take 1 tablet (25 mg total) by mouth daily.   nitrofurantoin (MACRODANTIN) 100 MG capsule, Take 1 capsule (100 mg total) by mouth at bedtime.   nitrofurantoin, macrocrystal-monohydrate, (MACROBID) 100 MG capsule, Take 1 capsule (100 mg total) by mouth 2 (two) times daily.   pantoprazole (PROTONIX) 40  MG tablet, Take 1 tablet (40 mg total) by mouth daily.   pantoprazole (PROTONIX) 40 MG tablet, Take 1 tablet (40 mg total) by mouth daily.   Phentermine HCl (LOMAIRA) 8 MG TABS, Take 1 tablet by mouth 30 minutes before breakfast once a day   QUEtiapine (SEROQUEL) 25 MG tablet, Take 1 tablet (25 mg total) by mouth at bedtime.   rOPINIRole (REQUIP) 0.25 MG tablet, Take 1 tablet (0.25 mg total) by mouth 3 (three) times daily.   Semaglutide-Weight Management (WEGOVY) 0.25 MG/0.5ML SOAJ, Inject 0.25 mg into the skin once a week.   Semaglutide-Weight Management (WEGOVY) 0.5 MG/0.5ML SOAJ, Inject 0.5 mg into the skin once a week.   Semaglutide-Weight Management (WEGOVY) 0.5 MG/0.5ML SOAJ, Inject 0.5 mg into the skin once a week.   Semaglutide-Weight Management (WEGOVY) 1 MG/0.5ML SOAJ, Inject 1 mg into the skin once a week.   Semaglutide-Weight Management (WEGOVY) 1.7 MG/0.75ML SOAJ, Inject 1.7 mg into the skin once a week.   Semaglutide-Weight Management (WEGOVY) 1.7 MG/0.75ML SOAJ, Inject 1.7 mg into the skin once a week.   tirzepatide (ZEPBOUND) 10 MG/0.5ML Pen, Inject 10 mg into the skin once a week.   tirzepatide (ZEPBOUND) 10 MG/0.5ML Pen, Inject 10mg  under the skin once a week 30 days   tirzepatide (ZEPBOUND) 2.5 MG/0.5ML Pen, Inject 2.5 mg into the skin once a week.   tirzepatide (ZEPBOUND) 5 MG/0.5ML Pen, Inject 5 mg into the skin once a week.   URIBEL 81.6 MG TABS, Take 1 tablet (81.6 mg total) by mouth 3 (three) times daily.   Vitamin D, Ergocalciferol, (DRISDOL) 1.25  MG (50000 UNIT) CAPS capsule, Take 1 capsule (50,000 Units total) by mouth every 7 (seven) days.   Reviewed prior external information including notes and imaging from  primary care provider As well as notes that were available from care everywhere and other healthcare systems.  Past medical history, social, surgical and family history all reviewed in electronic medical record.  No pertanent information unless stated  regarding to the chief complaint.   Review of Systems:  No headache, visual changes, nausea, vomiting, diarrhea, constipation, dizziness, abdominal pain, skin rash, fevers, chills, night sweats, weight loss, swollen lymph nodes, body aches, joint swelling, chest pain, shortness of breath, mood changes. POSITIVE muscle aches, worsening leg pain  Objective  Blood pressure 132/86, pulse 72, height 5\' 2"  (1.575 m), SpO2 99%.   General: No apparent distress alert and oriented x3 mood and affect normal, dressed appropriately.  HEENT: Pupils equal, extraocular movements intact  Respiratory: Patient's speak in full sentences and does not appear short of breath  Cardiovascular: No lower extremity edema, non tender, no erythema  Shoulder exam shows tightness noted in the parascapular area bilaterally.  Does have some in the rhomboids, trapezius, as well as the levator scapula bilaterally.  After verbal consent patient was prepped with alcohol swab and with a 25-gauge half inch needle injected in 6 distinct trigger points on the right and left shoulder region.  Total of 2 cc of 0.5% Marcaine and 2 cc of Kenalog 40 mg/mL used.  Neck exam shows some limited range of motion in different planes.   Impression and Recommendations:    The above documentation has been reviewed and is accurate and complete [name], DO

## 2023-07-16 ENCOUNTER — Ambulatory Visit: Payer: Commercial Managed Care - PPO | Admitting: Family Medicine

## 2023-07-16 ENCOUNTER — Encounter: Payer: Self-pay | Admitting: Family Medicine

## 2023-07-16 VITALS — BP 132/86 | HR 72 | Ht 62.0 in

## 2023-07-16 DIAGNOSIS — R2990 Unspecified symptoms and signs involving the nervous system: Secondary | ICD-10-CM

## 2023-07-16 DIAGNOSIS — M25512 Pain in left shoulder: Secondary | ICD-10-CM

## 2023-07-16 DIAGNOSIS — M25511 Pain in right shoulder: Secondary | ICD-10-CM

## 2023-07-16 DIAGNOSIS — M2550 Pain in unspecified joint: Secondary | ICD-10-CM

## 2023-07-16 NOTE — Assessment & Plan Note (Signed)
Repeat injections given again today.  Tolerated the procedure well.  Has had this chronically and more exacerbation again.  We did discuss the possibility of a cervical MRI which patient still wants to hold on at this time.  Will continue to monitor.  Follow-up again in 6 to 8 weeks.

## 2023-07-16 NOTE — Assessment & Plan Note (Signed)
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  Patient's mom recently did have a very large stroke 2 weeks ago.  Is on estrogen replacement.  Having bilateral leg pain.  D-dimer ordered today.

## 2023-07-16 NOTE — Patient Instructions (Addendum)
Trigger point injections today Labs today Yoga wheel See me in 8 weeks

## 2023-07-17 ENCOUNTER — Encounter: Payer: Self-pay | Admitting: Family Medicine

## 2023-07-17 LAB — D-DIMER, QUANTITATIVE: D-Dimer, Quant: 0.28 ug{FEU}/mL (ref <0.50)

## 2023-07-24 ENCOUNTER — Other Ambulatory Visit: Payer: Self-pay

## 2023-07-25 ENCOUNTER — Encounter: Payer: Self-pay | Admitting: Family Medicine

## 2023-07-25 DIAGNOSIS — M62838 Other muscle spasm: Secondary | ICD-10-CM

## 2023-07-25 DIAGNOSIS — M2550 Pain in unspecified joint: Secondary | ICD-10-CM

## 2023-07-26 ENCOUNTER — Other Ambulatory Visit (HOSPITAL_BASED_OUTPATIENT_CLINIC_OR_DEPARTMENT_OTHER): Payer: Self-pay

## 2023-07-26 MED ORDER — PREDNISONE 50 MG PO TABS
50.0000 mg | ORAL_TABLET | Freq: Every day | ORAL | 0 refills | Status: DC
  Filled 2023-07-26: qty 5, 5d supply, fill #0

## 2023-07-26 NOTE — Telephone Encounter (Signed)
Patient called and stated that she is even worse today. last night she had her husband use the massage gun and massage to try and help the knots and it did not help. 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   Patient really wants to know if there is something she should be avoiding or doing to makes ure this doesn't get even worse.   Informed patient that Dr. Katrinka Blazing is not here today but may still respond through mychart. I told patient I would send to one of his assistants incase another provider had any suggestions.

## 2023-07-29 ENCOUNTER — Other Ambulatory Visit (HOSPITAL_BASED_OUTPATIENT_CLINIC_OR_DEPARTMENT_OTHER): Payer: Self-pay

## 2023-07-29 DIAGNOSIS — Z6831 Body mass index (BMI) 31.0-31.9, adult: Secondary | ICD-10-CM | POA: Diagnosis not present

## 2023-07-29 DIAGNOSIS — E782 Mixed hyperlipidemia: Secondary | ICD-10-CM | POA: Diagnosis not present

## 2023-07-29 DIAGNOSIS — E559 Vitamin D deficiency, unspecified: Secondary | ICD-10-CM | POA: Diagnosis not present

## 2023-07-29 DIAGNOSIS — E669 Obesity, unspecified: Secondary | ICD-10-CM | POA: Diagnosis not present

## 2023-07-29 DIAGNOSIS — I10 Essential (primary) hypertension: Secondary | ICD-10-CM | POA: Diagnosis not present

## 2023-07-29 MED ORDER — OSELTAMIVIR PHOSPHATE 75 MG PO CAPS
75.0000 mg | ORAL_CAPSULE | Freq: Two times a day (BID) | ORAL | 0 refills | Status: DC
  Filled 2023-07-29: qty 10, 5d supply, fill #0

## 2023-07-29 MED ORDER — WEGOVY 2.4 MG/0.75ML ~~LOC~~ SOAJ
2.4000 mg | SUBCUTANEOUS | 1 refills | Status: DC
  Filled 2023-07-29 – 2023-08-19 (×4): qty 3, 28d supply, fill #0
  Filled 2023-09-19: qty 3, 28d supply, fill #1

## 2023-07-29 MED ORDER — METHOCARBAMOL 500 MG PO TABS
500.0000 mg | ORAL_TABLET | Freq: Three times a day (TID) | ORAL | 1 refills | Status: DC | PRN
  Filled 2023-07-29: qty 60, 20d supply, fill #0

## 2023-07-29 NOTE — Addendum Note (Signed)
Addended by: [name] on: 07/29/2023 11:48 AM   Modules accepted: Orders

## 2023-07-31 ENCOUNTER — Other Ambulatory Visit (HOSPITAL_BASED_OUTPATIENT_CLINIC_OR_DEPARTMENT_OTHER): Payer: Self-pay

## 2023-08-07 ENCOUNTER — Other Ambulatory Visit (HOSPITAL_BASED_OUTPATIENT_CLINIC_OR_DEPARTMENT_OTHER): Payer: Self-pay

## 2023-08-07 ENCOUNTER — Other Ambulatory Visit: Payer: Self-pay

## 2023-08-08 ENCOUNTER — Other Ambulatory Visit (HOSPITAL_BASED_OUTPATIENT_CLINIC_OR_DEPARTMENT_OTHER): Payer: Self-pay

## 2023-08-15 ENCOUNTER — Other Ambulatory Visit (HOSPITAL_BASED_OUTPATIENT_CLINIC_OR_DEPARTMENT_OTHER): Payer: Self-pay

## 2023-08-19 ENCOUNTER — Other Ambulatory Visit: Payer: Self-pay

## 2023-08-19 ENCOUNTER — Other Ambulatory Visit (HOSPITAL_BASED_OUTPATIENT_CLINIC_OR_DEPARTMENT_OTHER): Payer: Self-pay

## 2023-08-19 MED ORDER — ESTRADIOL 0.025 MG/24HR TD PTTW
1.0000 | MEDICATED_PATCH | TRANSDERMAL | 3 refills | Status: DC
  Filled 2023-08-19: qty 24, 84d supply, fill #0

## 2023-08-27 ENCOUNTER — Other Ambulatory Visit (HOSPITAL_BASED_OUTPATIENT_CLINIC_OR_DEPARTMENT_OTHER): Payer: Self-pay

## 2023-08-27 DIAGNOSIS — E782 Mixed hyperlipidemia: Secondary | ICD-10-CM | POA: Diagnosis not present

## 2023-08-27 DIAGNOSIS — I10 Essential (primary) hypertension: Secondary | ICD-10-CM | POA: Diagnosis not present

## 2023-08-27 DIAGNOSIS — E669 Obesity, unspecified: Secondary | ICD-10-CM | POA: Diagnosis not present

## 2023-08-27 DIAGNOSIS — E559 Vitamin D deficiency, unspecified: Secondary | ICD-10-CM | POA: Diagnosis not present

## 2023-08-27 DIAGNOSIS — Z6832 Body mass index (BMI) 32.0-32.9, adult: Secondary | ICD-10-CM | POA: Diagnosis not present

## 2023-08-27 MED ORDER — HYDROXYZINE HCL 25 MG PO TABS
25.0000 mg | ORAL_TABLET | Freq: Every day | ORAL | 0 refills | Status: DC
  Filled 2023-08-27: qty 90, 90d supply, fill #0

## 2023-09-02 ENCOUNTER — Other Ambulatory Visit (HOSPITAL_BASED_OUTPATIENT_CLINIC_OR_DEPARTMENT_OTHER): Payer: Self-pay

## 2023-09-02 DIAGNOSIS — I10 Essential (primary) hypertension: Secondary | ICD-10-CM | POA: Diagnosis not present

## 2023-09-02 DIAGNOSIS — E039 Hypothyroidism, unspecified: Secondary | ICD-10-CM | POA: Diagnosis not present

## 2023-09-02 DIAGNOSIS — Z0000 Encounter for general adult medical examination without abnormal findings: Secondary | ICD-10-CM | POA: Diagnosis not present

## 2023-09-02 DIAGNOSIS — L738 Other specified follicular disorders: Secondary | ICD-10-CM | POA: Diagnosis not present

## 2023-09-02 DIAGNOSIS — E785 Hyperlipidemia, unspecified: Secondary | ICD-10-CM | POA: Diagnosis not present

## 2023-09-02 DIAGNOSIS — F418 Other specified anxiety disorders: Secondary | ICD-10-CM | POA: Diagnosis not present

## 2023-09-02 DIAGNOSIS — E559 Vitamin D deficiency, unspecified: Secondary | ICD-10-CM | POA: Diagnosis not present

## 2023-09-02 DIAGNOSIS — Z1331 Encounter for screening for depression: Secondary | ICD-10-CM | POA: Diagnosis not present

## 2023-09-02 MED ORDER — TRETINOIN 0.05 % EX CREA
1.0000 | TOPICAL_CREAM | Freq: Every day | CUTANEOUS | 0 refills | Status: DC
  Filled 2023-09-02: qty 45, 30d supply, fill #0

## 2023-09-03 ENCOUNTER — Other Ambulatory Visit (HOSPITAL_BASED_OUTPATIENT_CLINIC_OR_DEPARTMENT_OTHER): Payer: Self-pay

## 2023-09-03 MED ORDER — CLONAZEPAM 0.5 MG PO TABS
0.5000 mg | ORAL_TABLET | Freq: Two times a day (BID) | ORAL | 1 refills | Status: DC
  Filled 2023-09-03: qty 45, 23d supply, fill #0
  Filled 2023-10-02: qty 45, 23d supply, fill #1

## 2023-09-03 MED ORDER — BUPROPION HCL ER (XL) 150 MG PO TB24
150.0000 mg | ORAL_TABLET | Freq: Every day | ORAL | 1 refills | Status: DC
  Filled 2023-09-03: qty 30, 30d supply, fill #0
  Filled 2023-10-17: qty 30, 30d supply, fill #1

## 2023-09-04 ENCOUNTER — Other Ambulatory Visit (HOSPITAL_BASED_OUTPATIENT_CLINIC_OR_DEPARTMENT_OTHER): Payer: Self-pay

## 2023-09-04 ENCOUNTER — Other Ambulatory Visit: Payer: Self-pay

## 2023-09-10 ENCOUNTER — Ambulatory Visit: Admitting: Dermatology

## 2023-09-10 ENCOUNTER — Other Ambulatory Visit (HOSPITAL_BASED_OUTPATIENT_CLINIC_OR_DEPARTMENT_OTHER): Payer: Self-pay

## 2023-09-10 MED ORDER — AIMOVIG 140 MG/ML ~~LOC~~ SOAJ
140.0000 mg | SUBCUTANEOUS | 11 refills | Status: AC
  Filled 2023-09-10: qty 1, 30d supply, fill #0
  Filled 2023-10-17: qty 1, 30d supply, fill #1
  Filled 2023-11-17: qty 1, 30d supply, fill #2
  Filled 2024-01-02: qty 1, 30d supply, fill #3
  Filled 2024-02-06: qty 1, 30d supply, fill #4
  Filled 2024-03-01: qty 1, 30d supply, fill #5
  Filled 2024-06-20: qty 1, 30d supply, fill #6

## 2023-09-11 ENCOUNTER — Other Ambulatory Visit (HOSPITAL_BASED_OUTPATIENT_CLINIC_OR_DEPARTMENT_OTHER): Payer: Self-pay

## 2023-09-11 ENCOUNTER — Ambulatory Visit: Admitting: Dermatology

## 2023-09-11 ENCOUNTER — Other Ambulatory Visit: Payer: Self-pay

## 2023-09-12 ENCOUNTER — Other Ambulatory Visit (HOSPITAL_BASED_OUTPATIENT_CLINIC_OR_DEPARTMENT_OTHER): Payer: Self-pay

## 2023-09-13 ENCOUNTER — Other Ambulatory Visit (HOSPITAL_BASED_OUTPATIENT_CLINIC_OR_DEPARTMENT_OTHER): Payer: Self-pay

## 2023-09-16 ENCOUNTER — Other Ambulatory Visit (HOSPITAL_BASED_OUTPATIENT_CLINIC_OR_DEPARTMENT_OTHER): Payer: Self-pay

## 2023-09-16 NOTE — Progress Notes (Unsigned)
 [name] [address] [place] [address] Phone: [phone] Subjective:   Carmen [name], am serving as a scribe for Dr. Antoine Primas.  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  Patient's mom recently did have a very large stroke 2 weeks ago.  Is on estrogen replacement.  Having bilateral leg pain.  D-dimer ordered today.     Repeat injections given again today.  Tolerated the procedure well.  Has had this chronically and more exacerbation again.  We did discuss the possibility of a cervical MRI which patient still wants to hold on at this time.  Will continue to monitor.  Follow-up again in 6 to 8 weeks.     Updated 09/18/2023 Carmen [name] is a 43 y.o. female coming in with complaint of L shoulder pain. Patient states that she had relief from trigger point injections for 1 week. Pain in L>R. Now working full time when she was part time.   Feels like she is losing muscle mass and gaining weight while on Wegovy. Feels very fatigued at end of the day. Wearing compression socks today due to pain.        Past Medical History:  Diagnosis Date   Back pain    Costochondritis 09/13/2010   GERD (gastroesophageal reflux disease)    Heart palpitations    long history of    Heartburn    History of chest pain    History of fatigue    History of kidney stones    History of migraine headaches    History of tilt table evaluation    IMPRESSION: Positive tilt table study with significant cardioinhibitory  response.   Hyperlipidemia    Hypertension    Hypothyroidism    Kidney stones    history of   migraine    Neurocardiogenic syncope    Palpitations    Pericarditis 09/13/2010   Presence of permanent cardiac pacemaker    PSVT (paroxysmal supraventricular tachycardia) (HCC)    Right ureteral calculus    Sleeping difficulties     Stress    Stroke (HCC) 06/15/2019   no residual deficits, tpa given at time of stroke   Stroke [facility])    tia jan 2022   SVD (spontaneous vaginal delivery)    x 1   Syncope and collapse    neurocardiogenic syncopy started age 82   Wears glasses    Past Surgical History:  Procedure Laterality Date   ANTERIOR AND POSTERIOR REPAIR N/A 11/14/2017   Procedure: ANTERIOR (CYSTOCELE) AND POSTERIOR REPAIR (RECTOCELE);  Surgeon: Silverio Lay, MD;  Location: [facility];  Service: Gynecology;  Laterality: N/A;   BILATERAL SALPINGECTOMY Bilateral 11/14/2017   Procedure: BILATERAL SALPINGECTOMY;  Surgeon: Silverio Lay, MD;  Location: [facility];  Service: Gynecology;  Laterality: Bilateral;   BLADDER SUSPENSION Bilateral 11/14/2017   Procedure: TRANSVAGINAL TAPE (TVT) PROCEDURE;  Surgeon: Osborn Coho, MD;  Location: [facility];  Service: Gynecology;  Laterality: Bilateral;   CHOLECYSTECTOMY  11/2007   Cholecystitis - Laparoscopic cholecystectomy with intraoperative  cholangiogram.   CYSTOSCOPY N/A 11/14/2017   Procedure: CYSTOSCOPY;  Surgeon: Osborn Coho, MD;  Location: [facility];  Service: Gynecology;  Laterality: N/A;   CYSTOSCOPY WITH RETROGRADE PYELOGRAM, URETEROSCOPY AND STENT PLACEMENT Right 06/25/2019   Procedure: CYSTOSCOPY WITH RETROGRADE PYELOGRAM, AND RIGHT  STENT PLACEMENT;  Surgeon: Noel Christmas, MD;  Location:  [facility];  Service: Urology;  Laterality: Right;   CYSTOSCOPY WITH RETROGRADE PYELOGRAM, URETEROSCOPY AND STENT PLACEMENT Right 07/07/2019   Procedure: CYSTOSCOPY , URETEROSCOPY AND STENT REPLACEMENT;  Surgeon: Noel Christmas, MD;  Location: [facility];  Service: Urology;  Laterality: Right;   CYSTOSCOPY WITH RETROGRADE PYELOGRAM, URETEROSCOPY AND STENT PLACEMENT Right 03/23/2021   Procedure: CYSTOSCOPY WITH RETROGRADE PYELOGRAM, URETEROSCOPY AND STENT PLACEMENT;  Surgeon: Marcine Matar, MD;  Location: [facility];  Service: Urology;  Laterality:  Right;   CYSTOSCOPY/URETEROSCOPY/HOLMIUM LASER/STENT PLACEMENT Left 07/05/2022   Procedure: CYSTOSCOPY LEFT RETROGRADE PYELOGRAM URETEROSCOPY/HOLMIUM LASER/STENT PLACEMENT;  Surgeon: Jerilee Field, MD;  Location: [facility];  Service: Urology;  Laterality: Left;   HOLMIUM LASER APPLICATION Right 07/07/2019   Procedure: HOLMIUM LASER APPLICATION;  Surgeon: Noel Christmas, MD;  Location: [facility];  Service: Urology;  Laterality: Right;   HOLMIUM LASER APPLICATION Right 03/23/2021   Procedure: HOLMIUM LASER APPLICATION;  Surgeon: Marcine Matar, MD;  Location: [facility];  Service: Urology;  Laterality: Right;   PACEMAKER INSERTION  2016   Tilt Table Study  07/17/2005   Positive tilt table study with significant cardioinhibitory  response.   VAGINAL HYSTERECTOMY N/A 11/14/2017   Procedure: HYSTERECTOMY VAGINAL;  Surgeon: Silverio Lay, MD;  Location: [facility];  Service: Gynecology;  Laterality: N/A;   WISDOM TOOTH EXTRACTION     age 65   Social History   Socioeconomic History   Marital status: Married    Spouse name: Not on file   Number of children: Not on file   Years of education: Not on file   Highest education level: Not on file  Occupational History   Occupation: mammography tech  Tobacco Use   Smoking status: Never   Smokeless tobacco: Never  Vaping Use   Vaping status: Never Used  Substance and Sexual Activity   Alcohol use: Yes    Comment: very rare   Drug use: Never   Sexual activity: Not on file  Other Topics Concern   Not on file  Social History Narrative   Not on file   Social Drivers of Health   Financial Resource Strain: Low Risk  (09/01/2023)   Received from [facility]   Overall Financial Resource Strain (CARDIA)    Difficulty of Paying Living Expenses: Not hard at all  Food Insecurity: No Food Insecurity (09/01/2023)   Received from [facility]   Hunger Vital Sign    Worried About Running Out of Food in the Last  Year: Never true    Ran Out of Food in the Last Year: Never true  Transportation Needs: No Transportation Needs (09/01/2023)   Received from [facility] - Transportation    Lack of Transportation (Medical): No    Lack of Transportation (Non-Medical): No  Physical Activity: Insufficiently Active (09/01/2023)   Received from [facility]   Exercise Vital Sign    Days of Exercise per Week: 3 days    Minutes of Exercise per Session: 40 min  Stress: Stress Concern Present (09/01/2023)   Received from [facility] - Occupational Stress Questionnaire    Feeling of Stress : To some extent  Social Connections: Somewhat Isolated (09/01/2023)   Received from [facility]   Social Network    How would you rate your social network (family, work, friends)?: Restricted participation with some degree of social isolation   Allergies  Allergen Reactions   Tape  Hives    Adhesive tape - tegaderm/paper tape ok   Prochlorperazine Other (See Comments)    Akathisia with compazine in ED on 05/17/19.  Resolved with repeat dose of benadryl.      Quinolones Nausea Only   Reglan [Metoclopramide] Other (See Comments)    flinging legs and "not feeling right".    Family History  Problem Relation Age of Onset   Hypertension Mother    Heart disease Mother    Cancer Mother        breast   Breast cancer Mother 35   Diabetes Mother    Hyperlipidemia Mother    Thyroid disease Mother    Depression Mother    Anxiety disorder Mother    Bipolar disorder Mother    Liver disease Mother    Alcoholism Mother    Obesity Mother    Heart attack Father    Hyperlipidemia Father    Hepatitis C Father    Diabetes Father    Hypertension Father    Heart disease Father    Sudden Cardiac Death Father    Depression Father    Anxiety disorder Father    Liver disease Father    Alcoholism Father    Drug abuse Father    Obesity Father    Cancer Maternal Aunt         Breast   Breast cancer Maternal Aunt 29   Breast cancer Maternal Grandmother 71    Current Outpatient Medications (Endocrine & Metabolic):    estradiol (VIVELLE-DOT) 0.025 MG/24HR, Place 1 patch onto the skin 2 (two) times a week.   levothyroxine (SYNTHROID) 137 MCG tablet, Take 1 tablet (137 mcg total) by mouth daily before breakfast.   metFORMIN (GLUCOPHAGE-XR) 500 MG 24 hr tablet, Take 1 tablet (500 mg total) by mouth every morning with a meal.   predniSONE (DELTASONE) 50 MG tablet, Take 1 tablet (50 mg total) by mouth daily.  Current Outpatient Medications (Cardiovascular):    metoprolol succinate (TOPROL-XL) 100 MG 24 hr tablet, Take one tablet (100 mg dose) by mouth daily. (Patient taking differently: Take 100 mg by mouth at bedtime.)   metoprolol succinate (TOPROL-XL) 50 MG 24 hr tablet, Take 2 tablets (100 mg total) by mouth in the morning AND 1 tablet (50 mg total) every evening.   metoprolol tartrate (LOPRESSOR) 25 MG tablet, Take 1 tablet (25 mg total) by mouth 2 (two) times daily.  Current Outpatient Medications (Respiratory):    promethazine (PHENERGAN) 25 MG tablet, Take 1 tablet (25 mg total) by mouth every 6 (six) hours as needed for nausea or vomiting.  Current Outpatient Medications (Analgesics):    aspirin EC 81 MG tablet, Take 81 mg by mouth daily. Swallow whole.   Erenumab-aooe (AIMOVIG) 140 MG/ML SOAJ, Inject 140 mg into the skin every 30 (thirty) days.   meloxicam (MOBIC) 15 MG tablet, Take 1 tablet (15 mg total) by mouth daily.   Rimegepant Sulfate (NURTEC) 75 MG TBDP, Take 1 tablet (75 mg total) by mouth daily as needed.   SUMAtriptan (IMITREX) 100 MG tablet, Take 1 tablet by mouth once daily as needed for migraine. May repeat ONCE in 2 hours.   Suzetrigine 50 MG TABS, Take 1 tablet by mouth 2 (two) times daily.   Current Outpatient Medications (Other):    buPROPion (WELLBUTRIN XL) 150 MG 24 hr tablet, Take 1 tablet (150 mg total) by mouth in the morning.    buPROPion (WELLBUTRIN XL) 150 MG 24 hr tablet, Take 1 tablet (150  mg total) by mouth daily.   busPIRone (BUSPAR) 10 MG tablet, Take 2 tablets (20 mg total) by mouth at bedtime.   clonazePAM (KLONOPIN) 0.5 MG tablet, Take 1 tablet (0.5 mg total) by mouth 2 (two) times daily. Max 2 tablets daily.   clotrimazole-betamethasone (LOTRISONE) cream, APPLY TO THE AFFECTED AND SURROUNDING AREAS OF SKIN BY TOPICAL ROUTE 2 TIMES PER DAY IN THE MORNING AND EVENING FOR 2 WEEKS   eszopiclone (LUNESTA) 2 MG TABS tablet, Take 1 tablet (2 mg total) by mouth at bedtime.   gabapentin (NEURONTIN) 100 MG capsule, Take 2 capsules (200 mg total) by mouth at bedtime.   hydrOXYzine (ATARAX) 25 MG tablet, Take 1 tablet (25 mg total) by mouth daily.   methocarbamol (ROBAXIN) 500 MG tablet, Take 1 tablet (500 mg total) by mouth every 8 (eight) hours as needed for muscle spasms.   oseltamivir (TAMIFLU) 75 MG capsule, Take one capsule (75 mg dose) by mouth 2 (two) times daily for 5 days.   pantoprazole (PROTONIX) 40 MG tablet, Take 1 tablet (40 mg total) by mouth daily.   rOPINIRole (REQUIP) 0.25 MG tablet, Take 1 tablet (0.25 mg total) by mouth 3 (three) times daily.   Semaglutide-Weight Management (WEGOVY) 1.7 MG/0.75ML SOAJ, Inject 1.7 mg into the skin once a week.   Semaglutide-Weight Management (WEGOVY) 2.4 MG/0.75ML SOAJ, Inject 2.4 mg into the skin once a week.   tretinoin (RETIN-A) 0.05 % cream, Apply 1 Application topically at bedtime.   Reviewed prior external information including notes and imaging from  primary care provider As well as notes that were available from care everywhere and other healthcare systems.  Past medical history, social, surgical and family history all reviewed in electronic medical record.  No pertanent information unless stated regarding to the chief complaint.   Review of Systems:  No headache, visual changes, nausea, vomiting, diarrhea, constipation, dizziness, abdominal pain, skin rash,  fevers, chills, night sweats, weight loss, swollen lymph nodes, body aches, joint swelling, chest pain, shortness of breath, mood changes. POSITIVE muscle aches  Objective  Blood pressure (!) 128/92, pulse 76, height 5\' 2"  (1.575 m), weight 188 lb (85.3 kg), SpO2 98%.   General: No apparent distress alert and oriented x3 mood and affect normal, dressed appropriately.  HEENT: Pupils equal, extraocular movements intact  Respiratory: Patient's speak in full sentences and does not appear short of breath  Cardiovascular: No lower extremity edema, non tender, no erythema   The low back does have some loss lordosis noted.  Some tenderness to palpation noted.  Seems to be more over the right sacroiliac joint.  Negative straight leg test noted.  Osteopathic findings   L2 flexed rotated and side bent right L5 flexed rotated and side bent left Sacrum right on right    Impression and Recommendations:  Neck muscle spasm He does have normal muscle spasm noted.  Noted now seems to be more of the lower.  I do think that a advanced imaging with a MRI of the neck would be beneficial.  He does have a pacemaker in this and we do need to monitor.  Discussed icing regimen and home exercises, follow-up again in 6 to 8 weeks  Low back pain Low back pain noted.  New sodium channel needed after medication given to see what she thinks.  1 to avoid any type of narcotics which I agree with.  Did discuss with her that she does have a history of an arrhythmia but does have a pacemaker.  We discussed sodium channels for the heart as well as a potential side effect of muscle spasms being the biggest ones.  Discussed with patient about icing regimen and home exercises otherwise.  Follow-up again in 6 6 to 8 weeks.  Patient knows if any side effects to the medication to discontinue immediately.  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    Decision today to  treat with OMT was based on Physical Exam  After verbal consent patient was treated with HVLA, ME, FPR techniques in  lumbar and sacral areas, all areas are chronic   Patient tolerated the procedure well with improvement in symptoms  Patient given exercises, stretches and lifestyle modifications  See medications in patient instructions if given  Patient will follow up in 4-8 weeks  The above documentation has been reviewed and is accurate and complete [name], DO

## 2023-09-17 ENCOUNTER — Other Ambulatory Visit (HOSPITAL_BASED_OUTPATIENT_CLINIC_OR_DEPARTMENT_OTHER): Payer: Self-pay

## 2023-09-17 ENCOUNTER — Other Ambulatory Visit: Payer: Self-pay

## 2023-09-18 ENCOUNTER — Ambulatory Visit (INDEPENDENT_AMBULATORY_CARE_PROVIDER_SITE_OTHER): Payer: Commercial Managed Care - PPO | Admitting: Family Medicine

## 2023-09-18 ENCOUNTER — Other Ambulatory Visit (HOSPITAL_BASED_OUTPATIENT_CLINIC_OR_DEPARTMENT_OTHER): Payer: Self-pay

## 2023-09-18 ENCOUNTER — Encounter: Payer: Self-pay | Admitting: Family Medicine

## 2023-09-18 VITALS — BP 128/92 | HR 76 | Ht 62.0 in | Wt 188.0 lb

## 2023-09-18 DIAGNOSIS — M62838 Other muscle spasm: Secondary | ICD-10-CM

## 2023-09-18 DIAGNOSIS — G8929 Other chronic pain: Secondary | ICD-10-CM

## 2023-09-18 DIAGNOSIS — M9904 Segmental and somatic dysfunction of sacral region: Secondary | ICD-10-CM

## 2023-09-18 DIAGNOSIS — M545 Low back pain, unspecified: Secondary | ICD-10-CM | POA: Diagnosis not present

## 2023-09-18 DIAGNOSIS — M9903 Segmental and somatic dysfunction of lumbar region: Secondary | ICD-10-CM | POA: Diagnosis not present

## 2023-09-18 MED ORDER — SUZETRIGINE 50 MG PO TABS
1.0000 | ORAL_TABLET | Freq: Two times a day (BID) | ORAL | 1 refills | Status: DC
  Filled 2023-09-18: qty 60, 30d supply, fill #0
  Filled 2023-10-17: qty 60, 30d supply, fill #1

## 2023-09-18 NOTE — Assessment & Plan Note (Signed)
 Low back pain noted.  New sodium channel needed after medication given to see what she thinks.  1 to avoid any type of narcotics which I agree with.  Did discuss with her that she does have a history of an arrhythmia but does have a pacemaker.  We discussed sodium channels for the heart as well as a potential side effect of muscle spasms being the biggest ones.  Discussed with patient about icing regimen and home exercises otherwise.  Follow-up again in 6 6 to 8 weeks.  Patient knows if any side effects to the medication to discontinue immediately.  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

## 2023-09-18 NOTE — Assessment & Plan Note (Signed)
 He does have normal muscle spasm noted.  Noted now seems to be more of the lower.  I do think that a advanced imaging with a MRI of the neck would be beneficial.  He does have a pacemaker in this and we do need to monitor.  Discussed icing regimen and home exercises, follow-up again in 6 to 8 weeks

## 2023-09-18 NOTE — Patient Instructions (Addendum)
 Call to get MRI Rx filled- Suzetrigine  Send message in one month Have appt in 2 months

## 2023-09-19 ENCOUNTER — Other Ambulatory Visit (HOSPITAL_BASED_OUTPATIENT_CLINIC_OR_DEPARTMENT_OTHER): Payer: Self-pay

## 2023-09-20 ENCOUNTER — Other Ambulatory Visit (HOSPITAL_BASED_OUTPATIENT_CLINIC_OR_DEPARTMENT_OTHER): Payer: Self-pay

## 2023-09-20 ENCOUNTER — Other Ambulatory Visit: Payer: Self-pay

## 2023-09-23 DIAGNOSIS — Z45018 Encounter for adjustment and management of other part of cardiac pacemaker: Secondary | ICD-10-CM | POA: Diagnosis not present

## 2023-09-23 DIAGNOSIS — G90A Postural orthostatic tachycardia syndrome (POTS): Secondary | ICD-10-CM | POA: Diagnosis not present

## 2023-09-24 ENCOUNTER — Other Ambulatory Visit (HOSPITAL_BASED_OUTPATIENT_CLINIC_OR_DEPARTMENT_OTHER): Payer: Self-pay

## 2023-09-24 MED ORDER — ZEPBOUND 7.5 MG/0.5ML ~~LOC~~ SOAJ
7.5000 mg | SUBCUTANEOUS | 0 refills | Status: DC
Start: 2023-09-24 — End: 2023-10-17
  Filled 2023-09-24: qty 2, 28d supply, fill #0

## 2023-09-27 ENCOUNTER — Encounter: Payer: Self-pay | Admitting: Family Medicine

## 2023-10-03 ENCOUNTER — Other Ambulatory Visit: Payer: Self-pay

## 2023-10-04 DIAGNOSIS — G90A Postural orthostatic tachycardia syndrome (POTS): Secondary | ICD-10-CM | POA: Diagnosis not present

## 2023-10-04 DIAGNOSIS — Z45018 Encounter for adjustment and management of other part of cardiac pacemaker: Secondary | ICD-10-CM | POA: Diagnosis not present

## 2023-10-10 ENCOUNTER — Encounter: Payer: Self-pay | Admitting: Family Medicine

## 2023-10-13 ENCOUNTER — Other Ambulatory Visit (HOSPITAL_BASED_OUTPATIENT_CLINIC_OR_DEPARTMENT_OTHER): Payer: Self-pay

## 2023-10-14 ENCOUNTER — Other Ambulatory Visit (HOSPITAL_BASED_OUTPATIENT_CLINIC_OR_DEPARTMENT_OTHER): Payer: Self-pay

## 2023-10-14 ENCOUNTER — Other Ambulatory Visit: Payer: Self-pay

## 2023-10-14 MED ORDER — LEVOTHYROXINE SODIUM 150 MCG PO TABS
150.0000 ug | ORAL_TABLET | Freq: Every day | ORAL | 0 refills | Status: DC
  Filled 2023-10-14: qty 30, 30d supply, fill #0

## 2023-10-15 ENCOUNTER — Other Ambulatory Visit (HOSPITAL_BASED_OUTPATIENT_CLINIC_OR_DEPARTMENT_OTHER): Payer: Self-pay

## 2023-10-15 MED ORDER — ONDANSETRON 4 MG PO TBDP
4.0000 mg | ORAL_TABLET | Freq: Three times a day (TID) | ORAL | 0 refills | Status: DC | PRN
  Filled 2023-10-15: qty 20, 7d supply, fill #0

## 2023-10-17 ENCOUNTER — Other Ambulatory Visit (HOSPITAL_BASED_OUTPATIENT_CLINIC_OR_DEPARTMENT_OTHER): Payer: Self-pay

## 2023-10-17 ENCOUNTER — Other Ambulatory Visit: Payer: Self-pay

## 2023-10-17 DIAGNOSIS — E559 Vitamin D deficiency, unspecified: Secondary | ICD-10-CM | POA: Diagnosis not present

## 2023-10-17 DIAGNOSIS — E782 Mixed hyperlipidemia: Secondary | ICD-10-CM | POA: Diagnosis not present

## 2023-10-17 DIAGNOSIS — R32 Unspecified urinary incontinence: Secondary | ICD-10-CM | POA: Diagnosis not present

## 2023-10-17 DIAGNOSIS — Z6832 Body mass index (BMI) 32.0-32.9, adult: Secondary | ICD-10-CM | POA: Diagnosis not present

## 2023-10-17 DIAGNOSIS — I10 Essential (primary) hypertension: Secondary | ICD-10-CM | POA: Diagnosis not present

## 2023-10-17 DIAGNOSIS — R232 Flushing: Secondary | ICD-10-CM | POA: Diagnosis not present

## 2023-10-17 DIAGNOSIS — N952 Postmenopausal atrophic vaginitis: Secondary | ICD-10-CM | POA: Diagnosis not present

## 2023-10-17 DIAGNOSIS — E669 Obesity, unspecified: Secondary | ICD-10-CM | POA: Diagnosis not present

## 2023-10-17 DIAGNOSIS — N760 Acute vaginitis: Secondary | ICD-10-CM | POA: Diagnosis not present

## 2023-10-17 MED ORDER — ZEPBOUND 7.5 MG/0.5ML ~~LOC~~ SOAJ
7.5000 mg | SUBCUTANEOUS | 0 refills | Status: DC
  Filled 2023-10-17: qty 2, 28d supply, fill #0

## 2023-10-18 ENCOUNTER — Other Ambulatory Visit (HOSPITAL_BASED_OUTPATIENT_CLINIC_OR_DEPARTMENT_OTHER): Payer: Self-pay

## 2023-10-21 ENCOUNTER — Other Ambulatory Visit (HOSPITAL_BASED_OUTPATIENT_CLINIC_OR_DEPARTMENT_OTHER): Payer: Self-pay

## 2023-10-21 MED ORDER — ESTRADIOL 0.05 MG/24HR TD PTTW
1.0000 | MEDICATED_PATCH | TRANSDERMAL | 1 refills | Status: AC
  Filled 2023-10-21 – 2023-11-13 (×2): qty 8, 28d supply, fill #0
  Filled 2023-12-09: qty 8, 28d supply, fill #1

## 2023-10-29 ENCOUNTER — Other Ambulatory Visit (HOSPITAL_BASED_OUTPATIENT_CLINIC_OR_DEPARTMENT_OTHER): Payer: Self-pay

## 2023-10-29 MED ORDER — ZEPBOUND 10 MG/0.5ML ~~LOC~~ SOAJ
10.0000 mg | SUBCUTANEOUS | 1 refills | Status: DC
  Filled 2023-10-29 – 2023-11-13 (×2): qty 2, 28d supply, fill #0
  Filled 2023-12-09 – 2024-03-01 (×2): qty 2, 28d supply, fill #1

## 2023-10-30 ENCOUNTER — Other Ambulatory Visit (HOSPITAL_BASED_OUTPATIENT_CLINIC_OR_DEPARTMENT_OTHER): Payer: Self-pay

## 2023-10-31 ENCOUNTER — Other Ambulatory Visit (HOSPITAL_BASED_OUTPATIENT_CLINIC_OR_DEPARTMENT_OTHER): Payer: Self-pay

## 2023-11-04 ENCOUNTER — Ambulatory Visit (HOSPITAL_COMMUNITY)
Admission: RE | Admit: 2023-11-04 | Discharge: 2023-11-04 | Disposition: A | Discharge status: Home / Self Care | Source: Ambulatory Visit | Attending: Family Medicine | Admitting: Family Medicine

## 2023-11-04 DIAGNOSIS — M4802 Spinal stenosis, cervical region: Secondary | ICD-10-CM | POA: Diagnosis not present

## 2023-11-04 DIAGNOSIS — M542 Cervicalgia: Secondary | ICD-10-CM | POA: Diagnosis not present

## 2023-11-04 DIAGNOSIS — M50221 Other cervical disc displacement at C4-C5 level: Secondary | ICD-10-CM | POA: Diagnosis not present

## 2023-11-04 DIAGNOSIS — M50222 Other cervical disc displacement at C5-C6 level: Secondary | ICD-10-CM | POA: Diagnosis not present

## 2023-11-04 DIAGNOSIS — M5021 Other cervical disc displacement,  high cervical region: Secondary | ICD-10-CM | POA: Diagnosis not present

## 2023-11-11 ENCOUNTER — Encounter: Payer: Self-pay | Admitting: Family Medicine

## 2023-11-13 ENCOUNTER — Other Ambulatory Visit (HOSPITAL_BASED_OUTPATIENT_CLINIC_OR_DEPARTMENT_OTHER): Payer: Self-pay

## 2023-11-14 ENCOUNTER — Other Ambulatory Visit (HOSPITAL_BASED_OUTPATIENT_CLINIC_OR_DEPARTMENT_OTHER): Payer: Self-pay

## 2023-11-14 ENCOUNTER — Other Ambulatory Visit: Payer: Self-pay

## 2023-11-15 ENCOUNTER — Ambulatory Visit: Payer: Self-pay | Admitting: Family Medicine

## 2023-11-17 ENCOUNTER — Other Ambulatory Visit (HOSPITAL_BASED_OUTPATIENT_CLINIC_OR_DEPARTMENT_OTHER): Payer: Self-pay

## 2023-11-17 ENCOUNTER — Other Ambulatory Visit: Payer: Self-pay | Admitting: Family Medicine

## 2023-11-18 ENCOUNTER — Other Ambulatory Visit (HOSPITAL_BASED_OUTPATIENT_CLINIC_OR_DEPARTMENT_OTHER): Payer: Self-pay

## 2023-11-18 MED ORDER — HYDROXYZINE HCL 25 MG PO TABS
25.0000 mg | ORAL_TABLET | Freq: Every day | ORAL | 0 refills | Status: DC
  Filled 2023-11-18: qty 90, 90d supply, fill #0

## 2023-11-18 MED ORDER — BUPROPION HCL ER (XL) 150 MG PO TB24
150.0000 mg | ORAL_TABLET | Freq: Every day | ORAL | 1 refills | Status: DC
  Filled 2023-11-18: qty 30, 30d supply, fill #0
  Filled 2023-12-09 – 2023-12-12 (×2): qty 30, 30d supply, fill #1

## 2023-11-18 MED ORDER — CLONAZEPAM 0.5 MG PO TABS
0.5000 mg | ORAL_TABLET | Freq: Two times a day (BID) | ORAL | 1 refills | Status: DC
Start: 2023-11-18 — End: 2024-03-02
  Filled 2023-11-18: qty 45, 23d supply, fill #0
  Filled 2023-12-09: qty 45, 23d supply, fill #1

## 2023-11-19 ENCOUNTER — Other Ambulatory Visit: Payer: Self-pay

## 2023-11-19 ENCOUNTER — Other Ambulatory Visit (HOSPITAL_BASED_OUTPATIENT_CLINIC_OR_DEPARTMENT_OTHER): Payer: Self-pay

## 2023-11-19 MED ORDER — JOURNAVX 50 MG PO TABS
1.0000 | ORAL_TABLET | Freq: Two times a day (BID) | ORAL | 1 refills | Status: DC
  Filled 2023-11-19: qty 60, 30d supply, fill #0
  Filled 2024-01-02: qty 60, 30d supply, fill #1

## 2023-11-19 MED ORDER — ESZOPICLONE 2 MG PO TABS
2.0000 mg | ORAL_TABLET | Freq: Every day | ORAL | 2 refills | Status: DC
  Filled 2023-11-19: qty 30, 30d supply, fill #0
  Filled 2024-01-02: qty 30, 30d supply, fill #1
  Filled 2024-02-06: qty 30, 30d supply, fill #2

## 2023-11-19 NOTE — Telephone Encounter (Signed)
Sent patient message to see if she was still using this medication.

## 2023-11-20 NOTE — Progress Notes (Unsigned)
 [facility] [address] [place] [address] Phone: [phone] Subjective:   Carmen [name], am serving as a scribe for Dr. Ronnell Coins.  I'm seeing this patient by the request  of:  Shaaron Dar, MD  CC: back and neck pain follow up   [id number]  Carmen [name] is a 43 y.o. female coming in with complaint of back and neck pain. OMT on 09/18/2023. Patient states that she continues to have sensitivity in R forearm and R lower leg over the tibia. Leg sensitivity is new. Dog licked leg the other day and it was painful. Symptoms worse after working.   Medications patient has been prescribed: journavx synthroid   Taking:         Reviewed prior external information including notes and imaging from previsou exam, outside providers and external EMR if available.   As well as notes that were available from care everywhere and other healthcare systems.  Past medical history, social, surgical and family history all reviewed in electronic medical record.  No pertanent information unless stated regarding to the chief complaint.   Past Medical History:  Diagnosis Date   Back pain    Costochondritis 09/13/2010   GERD (gastroesophageal reflux disease)    Heart palpitations    long history of    Heartburn    History of chest pain    History of fatigue    History of kidney stones    History of migraine headaches    History of tilt table evaluation    IMPRESSION: Positive tilt table study with significant cardioinhibitory  response.   Hyperlipidemia    Hypertension    Hypothyroidism    Kidney stones    history of   migraine    Neurocardiogenic syncope    Palpitations    Pericarditis 09/13/2010   Presence of permanent cardiac pacemaker    PSVT (paroxysmal supraventricular tachycardia) (HCC)    Right ureteral calculus    Sleeping difficulties    Stress    Stroke (HCC) 06/15/2019   no residual deficits, tpa given at time of stroke    Stroke [facility])    tia jan 2022   SVD (spontaneous vaginal delivery)    x 1   Syncope and collapse    neurocardiogenic syncopy started age 56   Wears glasses     Allergies  Allergen Reactions   Tape Hives    Adhesive tape - tegaderm/paper tape ok   Prochlorperazine Other (See Comments)    Akathisia with compazine in ED on 05/17/19.  Resolved with repeat dose of benadryl .      Quinolones Nausea Only   Reglan  [Metoclopramide ] Other (See Comments)    flinging legs and "not feeling right".      Review of Systems:  No headache, visual changes, nausea, vomiting, diarrhea, constipation, dizziness, abdominal pain, skin rash, fevers, chills, night sweats, weight loss, swollen lymph nodes, body aches, joint swelling, chest pain, shortness of breath, mood changes. POSITIVE muscle aches  Objective  Blood pressure 118/84, pulse (!) 101, height 5\' 2"  (1.575 m), weight 182 lb (82.6 kg), SpO2 99%.   General: No apparent distress alert and oriented x3 mood and affect normal, dressed appropriately.  HEENT: Pupils equal, extraocular movements intact  Respiratory: Patient's speak in full sentences and does not appear short of breath  Cardiovascular: No lower extremity edema, non tender, no erythema  Gait MSK:  Back doing relatively well overall.  Patient's neck is seem to be looser.  Assessment and Plan:  No problem-specific Assessment & Plan notes found for this encounter.                Note: This dictation was prepared with Dragon dictation along with smaller phrase technology. Any transcriptional errors that result from this process are unintentional.

## 2023-11-22 ENCOUNTER — Ambulatory Visit: Admitting: Family Medicine

## 2023-11-22 VITALS — BP 118/84 | HR 101 | Ht 62.0 in | Wt 182.0 lb

## 2023-11-22 DIAGNOSIS — E538 Deficiency of other specified B group vitamins: Secondary | ICD-10-CM | POA: Diagnosis not present

## 2023-11-22 DIAGNOSIS — M62838 Other muscle spasm: Secondary | ICD-10-CM | POA: Diagnosis not present

## 2023-11-22 DIAGNOSIS — M2550 Pain in unspecified joint: Secondary | ICD-10-CM | POA: Diagnosis not present

## 2023-11-22 LAB — VITAMIN B12: Vitamin B-12: 410 pg/mL (ref 211–911)

## 2023-11-22 LAB — IBC PANEL
Iron: 72 ug/dL (ref 42–145)
Saturation Ratios: 21.4 % (ref 20.0–50.0)
TIBC: 336 ug/dL (ref 250.0–450.0)
Transferrin: 240 mg/dL (ref 212.0–360.0)

## 2023-11-22 LAB — T3, FREE: T3, Free: 3.3 pg/mL (ref 2.3–4.2)

## 2023-11-22 LAB — VITAMIN D 25 HYDROXY (VIT D DEFICIENCY, FRACTURES): VITD: 33.56 ng/mL (ref 30.00–100.00)

## 2023-11-22 LAB — TSH: TSH: 11.86 u[IU]/mL — ABNORMAL HIGH (ref 0.35–5.50)

## 2023-11-22 LAB — FERRITIN: Ferritin: 44.8 ng/mL (ref 10.0–291.0)

## 2023-11-22 LAB — T4, FREE: Free T4: 0.86 ng/dL (ref 0.60–1.60)

## 2023-11-22 MED ORDER — CYANOCOBALAMIN 1000 MCG/ML IJ SOLN
1000.0000 ug | Freq: Once | INTRAMUSCULAR | Status: AC
  Administered 2023-11-22: 1000 ug via INTRAMUSCULAR

## 2023-11-22 NOTE — Patient Instructions (Addendum)
 [place] [facility] sandals Avoid being barefoot Labs today B12 injection See you again in 2 months

## 2023-11-22 NOTE — Assessment & Plan Note (Signed)
 Patient has responded extremely well to the peripheral sodium channel blocker.  Discussed icing regimen of home exercise.  Medications will not make any other significant changes at this time.  I do feel we should check labs again with patient having such difficulty with the thyroid  previously and could also be still contributing some.  Follow-up with me again 2 to 3 months

## 2023-11-24 ENCOUNTER — Encounter: Payer: Self-pay | Admitting: Family Medicine

## 2023-11-25 ENCOUNTER — Other Ambulatory Visit: Payer: Self-pay | Admitting: Family Medicine

## 2023-11-25 ENCOUNTER — Ambulatory Visit: Payer: Self-pay | Admitting: Family Medicine

## 2023-11-26 ENCOUNTER — Other Ambulatory Visit: Payer: Self-pay

## 2023-11-26 ENCOUNTER — Other Ambulatory Visit (HOSPITAL_BASED_OUTPATIENT_CLINIC_OR_DEPARTMENT_OTHER): Payer: Self-pay

## 2023-11-26 MED ORDER — THYROID 15 MG PO TABS
15.0000 mg | ORAL_TABLET | Freq: Every day | ORAL | 1 refills | Status: DC
  Filled 2023-11-26: qty 30, 30d supply, fill #0
  Filled 2024-01-02: qty 30, 30d supply, fill #1

## 2023-11-26 MED ORDER — LEVOTHYROXINE SODIUM 150 MCG PO TABS
150.0000 ug | ORAL_TABLET | Freq: Every day | ORAL | 0 refills | Status: DC
Start: 2023-11-26 — End: 2024-02-07
  Filled 2023-11-26: qty 30, 30d supply, fill #0

## 2023-11-27 ENCOUNTER — Other Ambulatory Visit: Payer: Self-pay

## 2023-11-27 ENCOUNTER — Other Ambulatory Visit (HOSPITAL_BASED_OUTPATIENT_CLINIC_OR_DEPARTMENT_OTHER): Payer: Self-pay

## 2023-11-27 MED ORDER — VITAMIN D (ERGOCALCIFEROL) 1.25 MG (50000 UNIT) PO CAPS
50000.0000 [IU] | ORAL_CAPSULE | ORAL | 0 refills | Status: DC
Start: 2023-11-27 — End: 2024-03-18
  Filled 2023-11-27 – 2023-12-09 (×2): qty 12, 84d supply, fill #0

## 2023-11-28 ENCOUNTER — Other Ambulatory Visit (HOSPITAL_BASED_OUTPATIENT_CLINIC_OR_DEPARTMENT_OTHER): Payer: Self-pay

## 2023-11-28 DIAGNOSIS — I10 Essential (primary) hypertension: Secondary | ICD-10-CM | POA: Diagnosis not present

## 2023-11-28 DIAGNOSIS — Z6831 Body mass index (BMI) 31.0-31.9, adult: Secondary | ICD-10-CM | POA: Diagnosis not present

## 2023-11-28 DIAGNOSIS — E559 Vitamin D deficiency, unspecified: Secondary | ICD-10-CM | POA: Diagnosis not present

## 2023-11-28 DIAGNOSIS — E669 Obesity, unspecified: Secondary | ICD-10-CM | POA: Diagnosis not present

## 2023-11-28 DIAGNOSIS — E782 Mixed hyperlipidemia: Secondary | ICD-10-CM | POA: Diagnosis not present

## 2023-11-28 MED ORDER — ZEPBOUND 10 MG/0.5ML ~~LOC~~ SOAJ
10.0000 mg | SUBCUTANEOUS | 1 refills | Status: DC
  Filled 2023-12-09: qty 2, 28d supply, fill #0
  Filled 2024-01-02 – 2024-02-05 (×5): qty 2, 28d supply, fill #1

## 2023-12-09 ENCOUNTER — Other Ambulatory Visit (HOSPITAL_BASED_OUTPATIENT_CLINIC_OR_DEPARTMENT_OTHER): Payer: Self-pay

## 2023-12-10 ENCOUNTER — Other Ambulatory Visit: Payer: Self-pay

## 2023-12-10 ENCOUNTER — Encounter: Payer: Self-pay | Admitting: Family Medicine

## 2023-12-10 ENCOUNTER — Other Ambulatory Visit (HOSPITAL_BASED_OUTPATIENT_CLINIC_OR_DEPARTMENT_OTHER): Payer: Self-pay

## 2023-12-12 ENCOUNTER — Other Ambulatory Visit (HOSPITAL_BASED_OUTPATIENT_CLINIC_OR_DEPARTMENT_OTHER): Payer: Self-pay

## 2023-12-12 MED ORDER — BUPROPION HCL ER (XL) 300 MG PO TB24
300.0000 mg | ORAL_TABLET | Freq: Every day | ORAL | 1 refills | Status: DC
  Filled 2023-12-12: qty 30, 30d supply, fill #0

## 2023-12-12 MED ORDER — ESTRADIOL 0.05 MG/24HR TD PTTW
1.0000 | MEDICATED_PATCH | TRANSDERMAL | 1 refills | Status: DC
  Filled 2023-12-12 – 2024-01-02 (×2): qty 24, 84d supply, fill #0
  Filled 2024-03-18: qty 24, 84d supply, fill #1

## 2023-12-13 ENCOUNTER — Other Ambulatory Visit (HOSPITAL_BASED_OUTPATIENT_CLINIC_OR_DEPARTMENT_OTHER): Payer: Self-pay

## 2024-01-01 DIAGNOSIS — M25562 Pain in left knee: Secondary | ICD-10-CM | POA: Diagnosis not present

## 2024-01-01 DIAGNOSIS — M25561 Pain in right knee: Secondary | ICD-10-CM | POA: Diagnosis not present

## 2024-01-02 ENCOUNTER — Ambulatory Visit: Admitting: Sports Medicine

## 2024-01-03 ENCOUNTER — Other Ambulatory Visit: Payer: Self-pay

## 2024-01-03 ENCOUNTER — Other Ambulatory Visit (HOSPITAL_BASED_OUTPATIENT_CLINIC_OR_DEPARTMENT_OTHER): Payer: Self-pay

## 2024-01-06 ENCOUNTER — Other Ambulatory Visit (HOSPITAL_BASED_OUTPATIENT_CLINIC_OR_DEPARTMENT_OTHER): Payer: Self-pay

## 2024-01-07 ENCOUNTER — Other Ambulatory Visit (HOSPITAL_BASED_OUTPATIENT_CLINIC_OR_DEPARTMENT_OTHER): Payer: Self-pay

## 2024-01-08 ENCOUNTER — Ambulatory Visit: Admitting: Sports Medicine

## 2024-01-08 VITALS — BP 124/82 | HR 82 | Ht 62.0 in | Wt 183.0 lb

## 2024-01-08 DIAGNOSIS — M25562 Pain in left knee: Secondary | ICD-10-CM | POA: Diagnosis not present

## 2024-01-08 DIAGNOSIS — Z950 Presence of cardiac pacemaker: Secondary | ICD-10-CM | POA: Diagnosis not present

## 2024-01-08 NOTE — Progress Notes (Signed)
 [name] [address] [place] [address] Phone: [phone]   Assessment and Plan:     1. 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  Consistent with either irritation of medial meniscus versus medial patellofemoral compartment.  No swelling or laxity felt on physical exam so no additional imaging at today's visit - Patient had imaging performed at [facility] and reports that it was unremarkable.  We will try to obtain these reports - Start HEP for knee stabilization - Start meloxicam  15 mg daily x2 weeks.  If still having pain after 2 weeks, complete 3rd-week of NSAID. May use remaining NSAID as needed once daily for pain control.  Do not to use additional over-the-counter NSAIDs (ibuprofen , naproxen , Advil , Aleve , etc.) while taking prescription NSAIDs.  May use Tylenol  [phone] mg 2 to 3 times a day for breakthrough pain. -Recommend avoiding physical activity that worsens pain.  Allowing rest breaks as needed if pain flares.  Work note provided that covers the next 4 weeks or until reevaluated  15 additional minutes spent for educating Therapeutic Home Exercise Program.  This included exercises focusing on stretching, strengthening, with focus on eccentric aspects.   Long term goals include an improvement in range of motion, strength, endurance as well as avoiding reinjury. Patient's frequency would include in 1-2 times a day, 3-5 times a week for a duration of 6-12 weeks. Proper technique shown and discussed handout in great detail with ATC.  All questions were discussed and answered.    Pertinent previous records reviewed include none  Follow Up: Patient has follow-up scheduled in 2 weeks with Dr. Claudene.  Recommend keeping this appointment.  Could consider ultrasound versus advanced imaging versus CSI versus  physical therapy if no improvement or worsening of symptoms   Subjective:   I, Carmen [name], am serving as a Neurosurgeon for Doctor Morene Mace  Chief Complaint: L knee pain   HPI:   01/08/24 Patient is a 43 year old female with left knee pain. Patient state her knee gave out a couple of weeks ago. Went to [facility] had xrays done. Ibu and gernvax for the pain doesn't help. No radiating pain. Medial knee pain. TTP to one spot. Decreased ROM    Relevant Historical Information: None pertinent  Additional pertinent review of systems negative.   Current Outpatient Medications:    thyroid  (ARMOUR THYROID ) 15 MG tablet, Take 1 tablet (15 mg total) by mouth daily., Disp: 30 tablet, Rfl: 1   aspirin  EC 81 MG tablet, Take 81 mg by mouth daily. Swallow whole., Disp: , Rfl:    buPROPion  (WELLBUTRIN  XL) 150 MG 24 hr tablet, Take 1 tablet (150 mg total) by mouth in the morning., Disp: 30 tablet, Rfl: 2   buPROPion  (WELLBUTRIN  XL) 150 MG 24 hr tablet, Take 1 tablet (150 mg total) by mouth daily., Disp: 30 tablet, Rfl: 1   buPROPion  (WELLBUTRIN  XL) 300 MG 24 hr tablet, Take 1 tablet (300 mg total) by mouth daily., Disp: 30 tablet, Rfl: 1   clonazePAM  (KLONOPIN ) 0.5 MG tablet, Take 1 tablet (0.5 mg total) by mouth 2 (two) times daily. Max 2 tablets daily., Disp: 45 tablet, Rfl: 1   clotrimazole -betamethasone  (LOTRISONE ) cream, APPLY TO THE AFFECTED AND SURROUNDING AREAS OF SKIN BY TOPICAL ROUTE 2 TIMES PER DAY IN THE MORNING AND  EVENING FOR 2 WEEKS, Disp: 15 g, Rfl: 6   Erenumab -aooe (AIMOVIG ) 140 MG/ML SOAJ, Inject 140 mg into the skin every 30 (thirty) days., Disp: 1 mL, Rfl: 11   estradiol  (VIVELLE -DOT) 0.025 MG/24HR, Place 1 patch onto the skin 2 (two) times a week., Disp: 24 patch, Rfl: 3   estradiol  (VIVELLE -DOT) 0.05 MG/24HR patch, Place 1 patch (0.05 mg total) onto the skin 2 (two) times a week., Disp: 8 patch, Rfl: 1   estradiol  (VIVELLE -DOT) 0.05 MG/24HR patch, Place 1 patch (0.05 mg  total) onto the skin 2 (two) times a week., Disp: 24 patch, Rfl: 1   eszopiclone  (LUNESTA ) 2 MG TABS tablet, Take 1 tablet (2 mg total) by mouth at bedtime., Disp: 30 tablet, Rfl: 2   hydrOXYzine  (ATARAX ) 25 MG tablet, Take 1 tablet (25 mg total) by mouth daily., Disp: 90 tablet, Rfl: 0   levothyroxine  (SYNTHROID ) 150 MCG tablet, Take 1 tablet (150 mcg total) by mouth daily before breakfast., Disp: 30 tablet, Rfl: 0   metFORMIN  (GLUCOPHAGE -XR) 500 MG 24 hr tablet, Take 1 tablet (500 mg total) by mouth every morning with a meal., Disp: 30 tablet, Rfl: 2   metoprolol  succinate (TOPROL -XL) 50 MG 24 hr tablet, Take 2 tablets (100 mg total) by mouth in the morning AND 1 tablet (50 mg total) every evening., Disp: 135 tablet, Rfl: 3   metoprolol  tartrate (LOPRESSOR ) 25 MG tablet, Take 1 tablet (25 mg total) by mouth 2 (two) times daily., Disp: 180 tablet, Rfl: 3   ondansetron  (ZOFRAN -ODT) 4 MG disintegrating tablet, Take one tablet (4 mg dose) by mouth every 8 (eight) hours as needed for Nausea for up to 7 days., Disp: 20 tablet, Rfl: 0   pantoprazole  (PROTONIX ) 40 MG tablet, Take 1 tablet (40 mg total) by mouth daily., Disp: 90 tablet, Rfl: 3   predniSONE  (DELTASONE ) 50 MG tablet, Take 1 tablet (50 mg total) by mouth daily., Disp: 5 tablet, Rfl: 0   Rimegepant Sulfate  (NURTEC) 75 MG TBDP, Take 1 tablet (75 mg total) by mouth daily as needed., Disp: 8 tablet, Rfl: 3   rOPINIRole  (REQUIP ) 0.25 MG tablet, Take 1 tablet (0.25 mg total) by mouth 3 (three) times daily., Disp: 90 tablet, Rfl: 2   SUMAtriptan  (IMITREX ) 100 MG tablet, Take 1 tablet by mouth once daily as needed for migraine. May repeat ONCE in 2 hours., Disp: 9 tablet, Rfl: 2   Suzetrigine  (JOURNAVX ) 50 MG TABS, Take 1 tablet by mouth 2 (two) times daily., Disp: 60 tablet, Rfl: 1   tirzepatide  (ZEPBOUND ) 10 MG/0.5ML Pen, Inject 10 mg into the skin once a week., Disp: 2 mL, Rfl: 1   tirzepatide  (ZEPBOUND ) 10 MG/0.5ML Pen, Inject 10 mg into the skin  once a week., Disp: 2 mL, Rfl: 1   tretinoin  (RETIN-A ) 0.05 % cream, Apply 1 Application topically at bedtime., Disp: 45 g, Rfl: 0   Vitamin D , Ergocalciferol , (DRISDOL ) 1.25 MG (50000 UNIT) CAPS capsule, Take 1 capsule (50,000 Units total) by mouth every 7 (seven) days., Disp: 12 capsule, Rfl: 0   Objective:     Vitals:   01/08/24 1255  BP: 124/82  Pulse: 82  SpO2: 100%  Weight: 183 lb (83 kg)  Height: 5' 2 (1.575 m)      Body mass index is 33.47 kg/m.    Physical Exam:    General:  awake, alert oriented, no acute distress nontoxic Skin: no suspicious lesions or rashes Neuro:sensation intact and strength 5/5 with no deficits, no atrophy,  normal muscle tone Psych: No signs of anxiety, depression or other mood disorder  Left knee: Crepitus present.  Not present on right No swelling No deformity Neg fluid wave, joint milking ROM Flex 110, Ext 0 TTP medial joint line NTTP over the quad tendon, medial fem condyle, lat fem condyle, patella, plica, patella tendon, tibial tuberostiy, fibular head, posterior fossa, pes anserine bursa, gerdy's tubercle,   lateral jt line Neg anterior and posterior drawer Neg lachman Neg sag sign Negative varus stress Negative valgus stress Negative McMurray Positive Thessaly  Gait normal    Electronically signed by:  Odis Mace [name] 2:13 PM 01/08/24

## 2024-01-08 NOTE — Patient Instructions (Addendum)
-   Start meloxicam  15 mg daily x2 weeks.  If still having pain after 2 weeks, complete 3rd-week of NSAID. May use remaining NSAID as needed once daily for pain control.  Do not to use additional over-the-counter NSAIDs (ibuprofen , naproxen , Advil , Aleve , etc.) while taking prescription NSAIDs.  May use Tylenol  [phone] mg 2 to 3 times a day for breakthrough pain.  Knee HEP   Recommend avoiding physical activity that worsens pain.  Allowing rest breaks if pain flares.  Work note to cover 4 weeks or until reevaluated.  Work note provided   Keep appointment with Dr. Claudene

## 2024-01-09 ENCOUNTER — Other Ambulatory Visit (HOSPITAL_BASED_OUTPATIENT_CLINIC_OR_DEPARTMENT_OTHER): Payer: Self-pay

## 2024-01-10 ENCOUNTER — Other Ambulatory Visit (HOSPITAL_BASED_OUTPATIENT_CLINIC_OR_DEPARTMENT_OTHER): Payer: Self-pay

## 2024-01-11 ENCOUNTER — Other Ambulatory Visit (HOSPITAL_BASED_OUTPATIENT_CLINIC_OR_DEPARTMENT_OTHER): Payer: Self-pay

## 2024-01-13 ENCOUNTER — Other Ambulatory Visit (HOSPITAL_BASED_OUTPATIENT_CLINIC_OR_DEPARTMENT_OTHER): Payer: Self-pay

## 2024-01-14 ENCOUNTER — Other Ambulatory Visit (HOSPITAL_BASED_OUTPATIENT_CLINIC_OR_DEPARTMENT_OTHER): Payer: Self-pay

## 2024-01-14 ENCOUNTER — Encounter: Payer: Self-pay | Admitting: Sports Medicine

## 2024-01-15 ENCOUNTER — Other Ambulatory Visit (HOSPITAL_BASED_OUTPATIENT_CLINIC_OR_DEPARTMENT_OTHER): Payer: Self-pay

## 2024-01-15 ENCOUNTER — Other Ambulatory Visit: Payer: Self-pay | Admitting: Sports Medicine

## 2024-01-15 MED ORDER — MELOXICAM 15 MG PO TABS
15.0000 mg | ORAL_TABLET | Freq: Every day | ORAL | 0 refills | Status: DC
  Filled 2024-01-15: qty 30, 30d supply, fill #0

## 2024-01-16 ENCOUNTER — Other Ambulatory Visit (HOSPITAL_BASED_OUTPATIENT_CLINIC_OR_DEPARTMENT_OTHER): Payer: Self-pay

## 2024-01-17 ENCOUNTER — Other Ambulatory Visit (HOSPITAL_BASED_OUTPATIENT_CLINIC_OR_DEPARTMENT_OTHER): Payer: Self-pay

## 2024-01-21 NOTE — Progress Notes (Unsigned)
 [facility] [address] [place] [address] Phone: [phone] Subjective:   ISusannah [name], am serving as a scribe for Dr. Arthea Claudene.  I'm seeing this patient by the request  of:  Joeann Clap, MD  CC: Low back and knee pain  YEP:Dlagzrupcz  11/22/2023 Patient has responded extremely well to the peripheral sodium channel blocker.  Discussed icing regimen of home exercise.  Medications will not make any other significant changes at this time.  I do feel we should check labs again with patient having such difficulty with the thyroid  previously and could also be still contributing some.  Follow-up with me again 2 to 3 months     Updated 01/23/2024 Carmen [name] is a 43 y.o. female coming in with complaint of polyarthralgia, was doing much better with the Journavx . Having pain while at work. Tenderness when feeling it and its medial. Instability early July which is why she saw Dr. Leonce. Meloxicam  didn't help, and was told not to wear a brace.  Patient was seen by another provider for an acute left knee injury.  Given meloxicam  initially.       Past Medical History:  Diagnosis Date   Back pain    Costochondritis 09/13/2010   GERD (gastroesophageal reflux disease)    Heart palpitations    long history of    Heartburn    History of chest pain    History of fatigue    History of kidney stones    History of migraine headaches    History of tilt table evaluation    IMPRESSION: Positive tilt table study with significant cardioinhibitory  response.   Hyperlipidemia    Hypertension    Hypothyroidism    Kidney stones    history of   migraine    Neurocardiogenic syncope    Palpitations    Pericarditis 09/13/2010   Presence of permanent cardiac pacemaker    PSVT (paroxysmal supraventricular tachycardia) (HCC)    Right ureteral calculus    Sleeping difficulties    Stress    Stroke (HCC) 06/15/2019   no residual deficits, tpa given  at time of stroke   Stroke [facility])    tia jan 2022   SVD (spontaneous vaginal delivery)    x 1   Syncope and collapse    neurocardiogenic syncopy started age 56   Wears glasses    Past Surgical History:  Procedure Laterality Date   ANTERIOR AND POSTERIOR REPAIR N/A 11/14/2017   Procedure: ANTERIOR (CYSTOCELE) AND POSTERIOR REPAIR (RECTOCELE);  Surgeon: Darcel Pool, MD;  Location: [facility];  Service: Gynecology;  Laterality: N/A;   BILATERAL SALPINGECTOMY Bilateral 11/14/2017   Procedure: BILATERAL SALPINGECTOMY;  Surgeon: Darcel Pool, MD;  Location: [facility];  Service: Gynecology;  Laterality: Bilateral;   BLADDER SUSPENSION Bilateral 11/14/2017   Procedure: TRANSVAGINAL TAPE (TVT) PROCEDURE;  Surgeon: Henry Slough, MD;  Location: [facility];  Service: Gynecology;  Laterality: Bilateral;   CHOLECYSTECTOMY  11/2007   Cholecystitis - Laparoscopic cholecystectomy with intraoperative  cholangiogram.   CYSTOSCOPY N/A 11/14/2017   Procedure: CYSTOSCOPY;  Surgeon: Henry Slough, MD;  Location: [facility];  Service: Gynecology;  Laterality: N/A;   CYSTOSCOPY WITH RETROGRADE PYELOGRAM, URETEROSCOPY AND STENT PLACEMENT Right 06/25/2019   Procedure: CYSTOSCOPY WITH RETROGRADE PYELOGRAM, AND RIGHT  STENT PLACEMENT;  Surgeon: Elisabeth Valli BIRCH, MD;  Location: [facility];  Service: Urology;  Laterality: Right;   CYSTOSCOPY WITH RETROGRADE PYELOGRAM, URETEROSCOPY AND STENT PLACEMENT Right 07/07/2019   Procedure:  CYSTOSCOPY , URETEROSCOPY AND STENT REPLACEMENT;  Surgeon: Elisabeth Valli BIRCH, MD;  Location: [facility];  Service: Urology;  Laterality: Right;   CYSTOSCOPY WITH RETROGRADE PYELOGRAM, URETEROSCOPY AND STENT PLACEMENT Right 03/23/2021   Procedure: CYSTOSCOPY WITH RETROGRADE PYELOGRAM, URETEROSCOPY AND STENT PLACEMENT;  Surgeon: Matilda Senior, MD;  Location: [facility];  Service: Urology;  Laterality: Right;   CYSTOSCOPY/URETEROSCOPY/HOLMIUM LASER/STENT PLACEMENT Left  07/05/2022   Procedure: CYSTOSCOPY LEFT RETROGRADE PYELOGRAM URETEROSCOPY/HOLMIUM LASER/STENT PLACEMENT;  Surgeon: Nieves Cough, MD;  Location: [facility];  Service: Urology;  Laterality: Left;   HOLMIUM LASER APPLICATION Right 07/07/2019   Procedure: HOLMIUM LASER APPLICATION;  Surgeon: Elisabeth Valli BIRCH, MD;  Location: [facility];  Service: Urology;  Laterality: Right;   HOLMIUM LASER APPLICATION Right 03/23/2021   Procedure: HOLMIUM LASER APPLICATION;  Surgeon: Matilda Senior, MD;  Location: [facility];  Service: Urology;  Laterality: Right;   PACEMAKER INSERTION  2016   Tilt Table Study  07/17/2005   Positive tilt table study with significant cardioinhibitory  response.   VAGINAL HYSTERECTOMY N/A 11/14/2017   Procedure: HYSTERECTOMY VAGINAL;  Surgeon: Darcel Pool, MD;  Location: [facility];  Service: Gynecology;  Laterality: N/A;   WISDOM TOOTH EXTRACTION     age 71   Social History   Socioeconomic History   Marital status: Married    Spouse name: Not on file   Number of children: Not on file   Years of education: Not on file   Highest education level: Not on file  Occupational History   Occupation: mammography tech  Tobacco Use   Smoking status: Never   Smokeless tobacco: Never  Vaping Use   Vaping status: Never Used  Substance and Sexual Activity   Alcohol use: Yes    Comment: very rare   Drug use: Never   Sexual activity: Not on file  Other Topics Concern   Not on file  Social History Narrative   Not on file   Social Drivers of Health   Financial Resource Strain: Low Risk  (09/01/2023)   Received from [facility]   Overall Financial Resource Strain (CARDIA)    Difficulty of Paying Living Expenses: Not hard at all  Food Insecurity: No Food Insecurity (09/01/2023)   Received [facility]   Hunger Vital Sign    Within the past 12 months, you worried that your food would run out before you got the money to buy more.: Never true     Within the past 12 months, the food you bought just didn't last and you didn't have money to get more.: Never true  Transportation Needs: No Transportation Needs (09/01/2023)   Received from [facility] - Transportation    Lack of Transportation (Medical): No    Lack of Transportation (Non-Medical): No  Physical Activity: Insufficiently Active (09/01/2023)   Received from [facility]   Exercise Vital Sign    On average, how many days per week do you engage in moderate to strenuous exercise (like a brisk walk)?: 3 days    On average, how many minutes do you engage in exercise at this level?: 40 min  Stress: Stress Concern Present (09/01/2023)   Received from [facility] - Occupational Stress Questionnaire    Feeling of Stress : To some extent  Social Connections: Somewhat Isolated (09/01/2023)   Received from [facility]   Social Network    How would you rate your  social network (family, work, friends)?: Restricted participation with some degree of social isolation   Allergies  Allergen Reactions   Tape Hives    Adhesive tape - tegaderm/paper tape ok   Prochlorperazine Other (See Comments)    Akathisia with compazine in ED on 05/17/19.  Resolved with repeat dose of benadryl .      Quinolones Nausea Only   Reglan  [Metoclopramide ] Other (See Comments)    flinging legs and not feeling right.    Family History  Problem Relation Age of Onset   Hypertension Mother    Heart disease Mother    Cancer Mother        breast   Breast cancer Mother 24   Diabetes Mother    Hyperlipidemia Mother    Thyroid  disease Mother    Depression Mother    Anxiety disorder Mother    Bipolar disorder Mother    Liver disease Mother    Alcoholism Mother    Obesity Mother    Heart attack Father    Hyperlipidemia Father    Hepatitis C Father    Diabetes Father    Hypertension Father    Heart disease Father    Sudden Cardiac Death Father     Depression Father    Anxiety disorder Father    Liver disease Father    Alcoholism Father    Drug abuse Father    Obesity Father    Cancer Maternal Aunt        Breast   Breast cancer Maternal Aunt 47   Breast cancer Maternal Grandmother 30    Current Outpatient Medications (Endocrine & Metabolic):    thyroid  (ARMOUR THYROID ) 15 MG tablet, Take 1 tablet (15 mg total) by mouth daily.   estradiol  (VIVELLE -DOT) 0.025 MG/24HR, Place 1 patch onto the skin 2 (two) times a week.   estradiol  (VIVELLE -DOT) 0.05 MG/24HR patch, Place 1 patch (0.05 mg total) onto the skin 2 (two) times a week.   estradiol  (VIVELLE -DOT) 0.05 MG/24HR patch, Place 1 patch (0.05 mg total) onto the skin 2 (two) times a week.   levothyroxine  (SYNTHROID ) 150 MCG tablet, Take 1 tablet (150 mcg total) by mouth daily before breakfast.   metFORMIN  (GLUCOPHAGE -XR) 500 MG 24 hr tablet, Take 1 tablet (500 mg total) by mouth every morning with a meal.   predniSONE  (DELTASONE ) 50 MG tablet, Take 1 tablet (50 mg total) by mouth daily.  Current Outpatient Medications (Cardiovascular):    metoprolol  succinate (TOPROL -XL) 50 MG 24 hr tablet, Take 2 tablets (100 mg total) by mouth in the morning AND 1 tablet (50 mg total) every evening.   metoprolol  tartrate (LOPRESSOR ) 25 MG tablet, Take 1 tablet (25 mg total) by mouth 2 (two) times daily.   Current Outpatient Medications (Analgesics):    aspirin  EC 81 MG tablet, Take 81 mg by mouth daily. Swallow whole.   Erenumab -aooe (AIMOVIG ) 140 MG/ML SOAJ, Inject 140 mg into the skin every 30 (thirty) days.   meloxicam  (MOBIC ) 15 MG tablet, Take 1 tablet (15 mg total) by mouth daily.   Rimegepant Sulfate  (NURTEC) 75 MG TBDP, Take 1 tablet (75 mg total) by mouth daily as needed.   SUMAtriptan  (IMITREX ) 100 MG tablet, Take 1 tablet by mouth once daily as needed for migraine. May repeat ONCE in 2 hours.   Suzetrigine  (JOURNAVX ) 50 MG TABS, Take 1 tablet by mouth 2 (two) times daily.   Current  Outpatient Medications (Other):    buPROPion  (WELLBUTRIN  XL) 150 MG 24 hr tablet, Take 1  tablet (150 mg total) by mouth in the morning.   buPROPion  (WELLBUTRIN  XL) 150 MG 24 hr tablet, Take 1 tablet (150 mg total) by mouth daily.   buPROPion  (WELLBUTRIN  XL) 300 MG 24 hr tablet, Take 1 tablet (300 mg total) by mouth daily.   clonazePAM  (KLONOPIN ) 0.5 MG tablet, Take 1 tablet (0.5 mg total) by mouth 2 (two) times daily. Max 2 tablets daily.   clotrimazole -betamethasone  (LOTRISONE ) cream, APPLY TO THE AFFECTED AND SURROUNDING AREAS OF SKIN BY TOPICAL ROUTE 2 TIMES PER DAY IN THE MORNING AND EVENING FOR 2 WEEKS   eszopiclone  (LUNESTA ) 2 MG TABS tablet, Take 1 tablet (2 mg total) by mouth at bedtime.   hydrOXYzine  (ATARAX ) 25 MG tablet, Take 1 tablet (25 mg total) by mouth daily.   ondansetron  (ZOFRAN -ODT) 4 MG disintegrating tablet, Take one tablet (4 mg dose) by mouth every 8 (eight) hours as needed for Nausea for up to 7 days.   pantoprazole  (PROTONIX ) 40 MG tablet, Take 1 tablet (40 mg total) by mouth daily.   rOPINIRole  (REQUIP ) 0.25 MG tablet, Take 1 tablet (0.25 mg total) by mouth 3 (three) times daily.   tirzepatide  (ZEPBOUND ) 10 MG/0.5ML Pen, Inject 10 mg into the skin once a week.   tirzepatide  (ZEPBOUND ) 10 MG/0.5ML Pen, Inject 10 mg into the skin once a week.   tretinoin  (RETIN-A ) 0.05 % cream, Apply 1 Application topically at bedtime.   Vitamin D , Ergocalciferol , (DRISDOL ) 1.25 MG (50000 UNIT) CAPS capsule, Take 1 capsule (50,000 Units total) by mouth every 7 (seven) days.   Reviewed prior external information including notes and imaging from  primary care provider As well as notes that were available from care everywhere and other healthcare systems.  Past medical history, social, surgical and family history all reviewed in electronic medical record.  No pertanent information unless stated regarding to the chief complaint.   Review of Systems:  No headache, visual changes, nausea,  vomiting, diarrhea, constipation, dizziness, abdominal pain, skin rash, fevers, chills, night sweats, weight loss, swollen lymph nodes, body aches, joint swelling, chest pain, shortness of breath, mood changes. POSITIVE muscle aches  Objective  Blood pressure 124/88, pulse 89, height 5' 2 (1.575 m), weight 186 lb (84.4 kg), SpO2 98%.   General: No apparent distress alert and oriented x3 mood and affect normal, dressed appropriately.  HEENT: Pupils equal, extraocular movements intact  Respiratory: Patient's speak in full sentences and does not appear short of breath  Cardiovascular: No lower extremity edema, non tender, no erythema  Knee exam shows mild tenderness over the medial joint line.  Mild discomfort with meniscus testing with McMurray's.  Full range of motion    Limited muscular skeletal ultrasound was performed and interpreted by CLAUDENE HUSSAR, M  Medial meniscus show a hypoechoic change within the meniscus but no true tear appreciated.  No dislocation noted noted. Impression: Possible medial meniscal injury   Impression and Recommendations:    The above documentation has been reviewed and is accurate and complete Dock Baccam M Rigby Leonhardt, DO

## 2024-01-23 ENCOUNTER — Ambulatory Visit: Payer: Self-pay | Admitting: Family Medicine

## 2024-01-23 ENCOUNTER — Ambulatory Visit: Admitting: Family Medicine

## 2024-01-23 ENCOUNTER — Other Ambulatory Visit: Payer: Self-pay

## 2024-01-23 VITALS — BP 124/88 | HR 89 | Ht 62.0 in | Wt 186.0 lb

## 2024-01-23 DIAGNOSIS — E039 Hypothyroidism, unspecified: Secondary | ICD-10-CM

## 2024-01-23 DIAGNOSIS — M25562 Pain in left knee: Secondary | ICD-10-CM

## 2024-01-23 DIAGNOSIS — M2550 Pain in unspecified joint: Secondary | ICD-10-CM

## 2024-01-23 DIAGNOSIS — I10 Essential (primary) hypertension: Secondary | ICD-10-CM | POA: Diagnosis not present

## 2024-01-23 DIAGNOSIS — Z6832 Body mass index (BMI) 32.0-32.9, adult: Secondary | ICD-10-CM | POA: Diagnosis not present

## 2024-01-23 DIAGNOSIS — E782 Mixed hyperlipidemia: Secondary | ICD-10-CM | POA: Diagnosis not present

## 2024-01-23 DIAGNOSIS — E669 Obesity, unspecified: Secondary | ICD-10-CM | POA: Diagnosis not present

## 2024-01-23 DIAGNOSIS — E559 Vitamin D deficiency, unspecified: Secondary | ICD-10-CM | POA: Diagnosis not present

## 2024-01-23 LAB — TSH: TSH: 0.38 u[IU]/mL (ref 0.35–5.50)

## 2024-01-23 LAB — T3, FREE: T3, Free: 3.1 pg/mL (ref 2.3–4.2)

## 2024-01-23 LAB — T4, FREE: Free T4: 1.04 ng/dL (ref 0.60–1.60)

## 2024-01-23 NOTE — Assessment & Plan Note (Signed)
 Rechecking thyroid  to see if medication is appropriately dosed at

## 2024-01-23 NOTE — Assessment & Plan Note (Signed)
 Seems to be a potential medial meniscal injury but no locking or giving out on her at this time.  Mechanism is likely consistent with this.  Discussed with patient about planting and twisting motions and trying to avoid it if possible.  Topical anti-inflammatories and resting, increase activity slowly.  Discussed icing regimen.  Follow-up again in 6 to 8 weeks.

## 2024-01-23 NOTE — Patient Instructions (Addendum)
 Work note Arnica topically Do prescribed exercises at least 3x a week start in 10 days Labs today See you again in 2-3 months

## 2024-02-05 ENCOUNTER — Other Ambulatory Visit (HOSPITAL_BASED_OUTPATIENT_CLINIC_OR_DEPARTMENT_OTHER): Payer: Self-pay

## 2024-02-05 ENCOUNTER — Other Ambulatory Visit: Payer: Self-pay

## 2024-02-06 ENCOUNTER — Encounter: Payer: Self-pay | Admitting: Family Medicine

## 2024-02-06 ENCOUNTER — Other Ambulatory Visit: Payer: Self-pay

## 2024-02-06 ENCOUNTER — Other Ambulatory Visit (HOSPITAL_BASED_OUTPATIENT_CLINIC_OR_DEPARTMENT_OTHER): Payer: Self-pay

## 2024-02-06 MED ORDER — NURTEC 75 MG PO TBDP
75.0000 mg | ORAL_TABLET | Freq: Every day | ORAL | 3 refills | Status: AC | PRN
  Filled 2024-03-01: qty 8, 16d supply, fill #0
  Filled 2024-03-18: qty 8, 16d supply, fill #1
  Filled 2024-05-03: qty 8, 16d supply, fill #2
  Filled 2024-06-07: qty 8, 16d supply, fill #3

## 2024-02-06 MED ORDER — HYDROXYZINE HCL 25 MG PO TABS
25.0000 mg | ORAL_TABLET | Freq: Every day | ORAL | 0 refills | Status: DC
  Filled 2024-03-01: qty 90, 90d supply, fill #0

## 2024-02-07 ENCOUNTER — Other Ambulatory Visit (HOSPITAL_BASED_OUTPATIENT_CLINIC_OR_DEPARTMENT_OTHER): Payer: Self-pay

## 2024-02-07 ENCOUNTER — Other Ambulatory Visit: Payer: Self-pay

## 2024-02-07 MED ORDER — LEVOTHYROXINE SODIUM 150 MCG PO TABS
150.0000 ug | ORAL_TABLET | Freq: Every day | ORAL | 0 refills | Status: DC
  Filled 2024-02-07: qty 90, 90d supply, fill #0

## 2024-02-07 MED ORDER — THYROID 15 MG PO TABS
15.0000 mg | ORAL_TABLET | Freq: Every day | ORAL | 0 refills | Status: DC
  Filled 2024-02-07: qty 90, 90d supply, fill #0

## 2024-02-07 MED ORDER — JOURNAVX 50 MG PO TABS
1.0000 | ORAL_TABLET | Freq: Two times a day (BID) | ORAL | 0 refills | Status: DC
  Filled 2024-02-07: qty 180, 90d supply, fill #0

## 2024-02-19 DIAGNOSIS — N6489 Other specified disorders of breast: Secondary | ICD-10-CM | POA: Diagnosis not present

## 2024-02-19 DIAGNOSIS — R928 Other abnormal and inconclusive findings on diagnostic imaging of breast: Secondary | ICD-10-CM | POA: Diagnosis not present

## 2024-02-20 ENCOUNTER — Other Ambulatory Visit (HOSPITAL_BASED_OUTPATIENT_CLINIC_OR_DEPARTMENT_OTHER): Payer: Self-pay

## 2024-02-20 DIAGNOSIS — R350 Frequency of micturition: Secondary | ICD-10-CM | POA: Diagnosis not present

## 2024-02-20 DIAGNOSIS — R351 Nocturia: Secondary | ICD-10-CM | POA: Diagnosis not present

## 2024-02-20 DIAGNOSIS — N3941 Urge incontinence: Secondary | ICD-10-CM | POA: Diagnosis not present

## 2024-02-20 MED ORDER — MIRABEGRON ER 50 MG PO TB24
50.0000 mg | ORAL_TABLET | Freq: Every day | ORAL | 5 refills | Status: AC
  Filled 2024-02-20: qty 30, 30d supply, fill #0
  Filled 2024-03-18: qty 30, 30d supply, fill #1
  Filled 2024-05-03: qty 30, 30d supply, fill #2
  Filled 2024-06-07: qty 30, 30d supply, fill #3
  Filled 2024-07-19: qty 30, 30d supply, fill #4

## 2024-03-01 ENCOUNTER — Other Ambulatory Visit (HOSPITAL_BASED_OUTPATIENT_CLINIC_OR_DEPARTMENT_OTHER): Payer: Self-pay

## 2024-03-02 ENCOUNTER — Other Ambulatory Visit (HOSPITAL_BASED_OUTPATIENT_CLINIC_OR_DEPARTMENT_OTHER): Payer: Self-pay

## 2024-03-02 ENCOUNTER — Other Ambulatory Visit: Payer: Self-pay

## 2024-03-02 MED ORDER — ESZOPICLONE 2 MG PO TABS
2.0000 mg | ORAL_TABLET | Freq: Every day | ORAL | 1 refills | Status: AC
  Filled 2024-03-02: qty 30, 30d supply, fill #0
  Filled 2024-03-05 – 2024-03-18 (×2): qty 90, 90d supply, fill #0
  Filled 2024-06-20: qty 90, 90d supply, fill #1

## 2024-03-02 MED ORDER — CLONAZEPAM 0.5 MG PO TABS
0.5000 mg | ORAL_TABLET | Freq: Two times a day (BID) | ORAL | 1 refills | Status: AC
  Filled 2024-03-02: qty 60, 30d supply, fill #0
  Filled 2024-03-23 – 2024-05-03 (×2): qty 60, 30d supply, fill #1
  Filled 2024-06-07: qty 60, 30d supply, fill #2
  Filled 2024-07-23: qty 60, 30d supply, fill #3

## 2024-03-03 ENCOUNTER — Other Ambulatory Visit: Payer: Self-pay

## 2024-03-05 ENCOUNTER — Other Ambulatory Visit (HOSPITAL_BASED_OUTPATIENT_CLINIC_OR_DEPARTMENT_OTHER): Payer: Self-pay

## 2024-03-05 ENCOUNTER — Other Ambulatory Visit: Payer: Self-pay

## 2024-03-16 NOTE — Progress Notes (Unsigned)
 [facility] [address] [place] [address] Phone: [phone] Subjective:   LILLETTE Berwyn Posey, am serving as a scribe for Dr. Arthea Claudene.  I'm seeing this patient by the request  of:  Joeann Clap, MD  CC: left knee pain  YEP:Dlagzrupcz  01/23/2024 Rechecking thyroid  to see if medication is appropriately dosed at     Seems to be a potential medial meniscal injury but no locking or giving out on her at this time.  Mechanism is likely consistent with this.  Discussed with patient about planting and twisting motions and trying to avoid it if possible.  Topical anti-inflammatories and resting, increase activity slowly.  Discussed icing regimen.  Follow-up again in 6 to 8 weeks     Update 03/17/2204 SARAIYAH HEMMINGER is a 43 y.o. female coming in with complaint of L knee pain. Patient states that she did have increase in upper back pain recently but that has subsided.   Pain in L knee was present when she was at beach. Less pain while at work. Sits criss cross a lot and this causes pain in her L knee.   Also c/o pain in L thumb. 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        Past Medical History:  Diagnosis Date   Back pain    Costochondritis 09/13/2010   GERD (gastroesophageal reflux disease)    Heart palpitations    long history of    Heartburn    History of chest pain    History of fatigue    History of kidney stones    History of migraine headaches    History of tilt table evaluation    IMPRESSION: Positive tilt table study with significant cardioinhibitory  response.   Hyperlipidemia    Hypertension    Hypothyroidism    Kidney stones    history of   migraine    Neurocardiogenic syncope    Palpitations    Pericarditis 09/13/2010   Presence of permanent cardiac pacemaker    PSVT (paroxysmal supraventricular tachycardia)    Right ureteral calculus    Sleeping difficulties    Stress    Stroke (HCC) 06/15/2019   no  residual deficits, tpa given at time of stroke   Stroke [facility])    tia jan 2022   SVD (spontaneous vaginal delivery)    x 1   Syncope and collapse    neurocardiogenic syncopy started age 26   Wears glasses    Past Surgical History:  Procedure Laterality Date   ANTERIOR AND POSTERIOR REPAIR N/A 11/14/2017   Procedure: ANTERIOR (CYSTOCELE) AND POSTERIOR REPAIR (RECTOCELE);  Surgeon: Darcel Pool, MD;  Location: [facility];  Service: Gynecology;  Laterality: N/A;   BILATERAL SALPINGECTOMY Bilateral 11/14/2017   Procedure: BILATERAL SALPINGECTOMY;  Surgeon: Darcel Pool, MD;  Location: [facility];  Service: Gynecology;  Laterality: Bilateral;   BLADDER SUSPENSION Bilateral 11/14/2017   Procedure: TRANSVAGINAL TAPE (TVT) PROCEDURE;  Surgeon: Henry Slough, MD;  Location: [facility];  Service: Gynecology;  Laterality: Bilateral;   CHOLECYSTECTOMY  11/2007   Cholecystitis - Laparoscopic cholecystectomy with intraoperative  cholangiogram.   CYSTOSCOPY N/A 11/14/2017   Procedure: CYSTOSCOPY;  Surgeon: Henry Slough, MD;  Location: [facility];  Service: Gynecology;  Laterality: N/A;   CYSTOSCOPY WITH RETROGRADE PYELOGRAM, URETEROSCOPY AND STENT PLACEMENT Right 06/25/2019   Procedure: CYSTOSCOPY WITH RETROGRADE PYELOGRAM, AND RIGHT  STENT PLACEMENT;  Surgeon: Elisabeth Valli BIRCH, MD;  Location: [facility];  Service: Urology;  Laterality: Right;   CYSTOSCOPY WITH RETROGRADE PYELOGRAM, URETEROSCOPY AND STENT PLACEMENT Right 07/07/2019   Procedure: CYSTOSCOPY , URETEROSCOPY AND STENT REPLACEMENT;  Surgeon: Elisabeth Valli BIRCH, MD;  Location: [facility];  Service: Urology;  Laterality: Right;   CYSTOSCOPY WITH RETROGRADE PYELOGRAM, URETEROSCOPY AND STENT PLACEMENT Right 03/23/2021   Procedure: CYSTOSCOPY WITH RETROGRADE PYELOGRAM, URETEROSCOPY AND STENT PLACEMENT;  Surgeon: Matilda Senior, MD;  Location[facility];  Service: Urology;  Laterality: Right;   CYSTOSCOPY/URETEROSCOPY/HOLMIUM  LASER/STENT PLACEMENT Left 07/05/2022   Procedure: CYSTOSCOPY LEFT RETROGRADE PYELOGRAM URETEROSCOPY/HOLMIUM LASER/STENT PLACEMENT;  Surgeon: Nieves Cough, MD;  Location: [facility];  Service: Urology;  Laterality: Left;   HOLMIUM LASER APPLICATION Right 07/07/2019   Procedure: HOLMIUM LASER APPLICATION;  Surgeon: Elisabeth Valli BIRCH, MD;  Location[facility];  Service: Urology;  Laterality: Right;   HOLMIUM LASER APPLICATION Right 03/23/2021   Procedure: HOLMIUM LASER APPLICATION;  Surgeon: Matilda Senior, MD;  Location: [facility];  Service: Urology;  Laterality: Right;   PACEMAKER INSERTION  2016   Tilt Table Study  07/17/2005   Positive tilt table study with significant cardioinhibitory  response.   VAGINAL HYSTERECTOMY N/A 11/14/2017   Procedure: HYSTERECTOMY VAGINAL;  Surgeon: Darcel Pool, MD;  Location: [facility];  Service: Gynecology;  Laterality: N/A;   WISDOM TOOTH EXTRACTION     age 3   Social History   Socioeconomic History   Marital status: Married    Spouse name: Not on file   Number of children: Not on file   Years of education: Not on file   Highest education level: Not on file  Occupational History   Occupation: mammography tech  Tobacco Use   Smoking status: Never   Smokeless tobacco: Never  Vaping Use   Vaping status: Never Used  Substance and Sexual Activity   Alcohol use: Yes    Comment: very rare   Drug use: Never   Sexual activity: Not on file  Other Topics Concern   Not on file  Social History Narrative   Not on file   Social Drivers of Health   Financial Resource Strain: Low Risk  (09/01/2023)   Received from [facility]   Overall Financial Resource Strain (CARDIA)    Difficulty of Paying Living Expenses: Not hard at all  Food Insecurity: Low Risk  (02/20/2024)   Received from [facility]   Hunger Vital Sign    Within the past 12 months, you worried that your food would run out before you got money to buy  more: Never true    Within the past 12 months, the food you bought just didn't last and you didn't have money to get more. : Never true  Transportation Needs: No Transportation Needs (02/20/2024)   Received from [facility]    In the past 12 months, has lack of reliable transportation kept you from medical appointments, meetings, work or from getting things needed for daily living? : No  Physical Activity: Insufficiently Active (09/01/2023)   Received from [facility]   Exercise Vital Sign    On average, how many days per week do you engage in moderate to strenuous exercise (like a brisk walk)?: 3 days    On average, how many minutes do you engage in exercise at this level?: 40 min  Stress: Stress Concern Present (09/01/2023)   Received from [facility] - Occupational Stress Questionnaire  Feeling of Stress : To some extent  Social Connections: Somewhat Isolated (09/01/2023)   Received from [facility]   Social Network    How would you rate your social network (family, work, friends)?: Restricted participation with some degree of social isolation   Allergies  Allergen Reactions   Tape Hives    Adhesive tape - tegaderm/paper tape ok   Prochlorperazine Other (See Comments)    Akathisia with compazine in ED on 05/17/19.  Resolved with repeat dose of benadryl .      Quinolones Nausea Only   Reglan  [Metoclopramide ] Other (See Comments)    flinging legs and not feeling right.    Family History  Problem Relation Age of Onset   Hypertension Mother    Heart disease Mother    Cancer Mother        breast   Breast cancer Mother 80   Diabetes Mother    Hyperlipidemia Mother    Thyroid  disease Mother    Depression Mother    Anxiety disorder Mother    Bipolar disorder Mother    Liver disease Mother    Alcoholism Mother    Obesity Mother    Heart attack Father    Hyperlipidemia Father    Hepatitis C Father    Diabetes  Father    Hypertension Father    Heart disease Father    Sudden Cardiac Death Father    Depression Father    Anxiety disorder Father    Liver disease Father    Alcoholism Father    Drug abuse Father    Obesity Father    Cancer Maternal Aunt        Breast   Breast cancer Maternal Aunt 47   Breast cancer Maternal Grandmother 69    Current Outpatient Medications (Endocrine & Metabolic):    estradiol  (VIVELLE -DOT) 0.025 MG/24HR, Place 1 patch onto the skin 2 (two) times a week.   estradiol  (VIVELLE -DOT) 0.05 MG/24HR patch, Place 1 patch (0.05 mg total) onto the skin 2 (two) times a week.   estradiol  (VIVELLE -DOT) 0.05 MG/24HR patch, Place 1 patch (0.05 mg total) onto the skin 2 (two) times a week.   levothyroxine  (SYNTHROID ) 150 MCG tablet, Take 1 tablet (150 mcg total) by mouth daily before breakfast.   metFORMIN  (GLUCOPHAGE -XR) 500 MG 24 hr tablet, Take 1 tablet (500 mg total) by mouth every morning with a meal.   predniSONE  (DELTASONE ) 50 MG tablet, Take 1 tablet (50 mg total) by mouth daily.   thyroid  (ARMOUR THYROID ) 15 MG tablet, Take 1 tablet (15 mg total) by mouth daily.  Current Outpatient Medications (Cardiovascular):    metoprolol  succinate (TOPROL -XL) 50 MG 24 hr tablet, Take 2 tablets (100 mg total) by mouth in the morning AND 1 tablet (50 mg total) every evening.   metoprolol  tartrate (LOPRESSOR ) 25 MG tablet, Take 1 tablet (25 mg total) by mouth 2 (two) times daily.   Current Outpatient Medications (Analgesics):    aspirin  EC 81 MG tablet, Take 81 mg by mouth daily. Swallow whole.   Erenumab -aooe (AIMOVIG ) 140 MG/ML SOAJ, Inject 140 mg into the skin every 30 (thirty) days.   meloxicam  (MOBIC ) 15 MG tablet, Take 1 tablet (15 mg total) by mouth daily.   Rimegepant Sulfate  (NURTEC) 75 MG TBDP, Take 1 tablet (75 mg total) by mouth daily as needed.   SUMAtriptan  (IMITREX ) 100 MG tablet, Take 1 tablet by mouth once daily as needed for migraine. May repeat ONCE in 2 hours.    Suzetrigine  (JOURNAVX )  50 MG TABS, Take 1 tablet by mouth 2 (two) times daily.   Current Outpatient Medications (Other):    buPROPion  (WELLBUTRIN  XL) 150 MG 24 hr tablet, Take 1 tablet (150 mg total) by mouth in the morning.   buPROPion  (WELLBUTRIN  XL) 150 MG 24 hr tablet, Take 1 tablet (150 mg total) by mouth daily.   buPROPion  (WELLBUTRIN  XL) 300 MG 24 hr tablet, Take 1 tablet (300 mg total) by mouth daily.   clonazePAM  (KLONOPIN ) 0.5 MG tablet, Take 1 tablet (0.5 mg total) by mouth 2 (two) times daily.Max 2 tablets daily   clotrimazole -betamethasone  (LOTRISONE ) cream, APPLY TO THE AFFECTED AND SURROUNDING AREAS OF SKIN BY TOPICAL ROUTE 2 TIMES PER DAY IN THE MORNING AND EVENING FOR 2 WEEKS   eszopiclone  (LUNESTA ) 2 MG TABS tablet, Take 1 tablet (2 mg total) by mouth at bedtime.   hydrOXYzine  (ATARAX ) 25 MG tablet, Take 1 tablet (25 mg total) by mouth daily.   mirabegron  ER (MYRBETRIQ ) 50 MG TB24 tablet, Take 1 tablet (50 mg total) by mouth daily.   ondansetron  (ZOFRAN -ODT) 4 MG disintegrating tablet, Take one tablet (4 mg dose) by mouth every 8 (eight) hours as needed for Nausea for up to 7 days.   pantoprazole  (PROTONIX ) 40 MG tablet, Take 1 tablet (40 mg total) by mouth daily.   rOPINIRole  (REQUIP ) 0.25 MG tablet, Take 1 tablet (0.25 mg total) by mouth 3 (three) times daily.   tirzepatide  (ZEPBOUND ) 10 MG/0.5ML Pen, Inject 10 mg into the skin once a week.   tirzepatide  (ZEPBOUND ) 10 MG/0.5ML Pen, Inject 10 mg into the skin once a week.   tretinoin  (RETIN-A ) 0.05 % cream, Apply 1 Application topically at bedtime.   Vitamin D , Ergocalciferol , (DRISDOL ) 1.25 MG (50000 UNIT) CAPS capsule, Take 1 capsule (50,000 Units total) by mouth every 7 (seven) days.   Reviewed prior external information including notes and imaging from  primary care provider As well as notes that were available from care everywhere and other healthcare systems.  Past medical history, social, surgical and family history  all reviewed in electronic medical record.  No pertanent information unless stated regarding to the chief complaint.   Review of Systems:  No headache, visual changes, nausea, vomiting, diarrhea, constipation, dizziness, abdominal pain, skin rash, fevers, chills, night sweats, weight loss, swollen lymph nodes, body aches, joint swelling, chest pain, shortness of breath, mood changes. POSITIVE muscle aches  Objective  Blood pressure 106/72, pulse 92, height 5' 2 (1.575 m), weight 170 lb (77.1 kg), SpO2 98%.   General: No apparent distress alert and oriented x3 mood and affect normal, dressed appropriately.  HEENT: Pupils equal, extraocular movements intact  Respiratory: Patient's speak in full sentences and does not appear short of breath  Cardiovascular: No lower extremity edema, non tender, no erythema  Left knee exam shows patient does have good range of motion noted.  Patient does have some mild tenderness still noted over the meniscus itself.  Negative McMurray's though noted today.  No effusion noted.  Limited muscular skeletal ultrasound was performed and interpreted by CLAUDENE HUSSAR, M   Limited ultrasound shows mild hypoechoic changes consistent with a very small parameniscal cyst noted.  Patient does not have any significant displacement noted.  Mild narrowing of the patellofemoral joint.   Impression and Recommendations:      The above documentation has been reviewed and is accurate and complete Mckaylah Bettendorf M Terrace Chiem, DO

## 2024-03-17 ENCOUNTER — Encounter: Payer: Self-pay | Admitting: Family Medicine

## 2024-03-17 ENCOUNTER — Other Ambulatory Visit: Payer: Self-pay

## 2024-03-17 ENCOUNTER — Ambulatory Visit (INDEPENDENT_AMBULATORY_CARE_PROVIDER_SITE_OTHER): Admitting: Family Medicine

## 2024-03-17 VITALS — BP 106/72 | HR 92 | Ht 62.0 in | Wt 170.0 lb

## 2024-03-17 DIAGNOSIS — M79645 Pain in left finger(s): Secondary | ICD-10-CM | POA: Diagnosis not present

## 2024-03-17 NOTE — Patient Instructions (Signed)
 Good to see you! The knee looks good Thumb no more Barbie hands Lets see what happens with the meds See you again in 3 months

## 2024-03-18 ENCOUNTER — Other Ambulatory Visit: Payer: Self-pay | Admitting: Family Medicine

## 2024-03-18 ENCOUNTER — Other Ambulatory Visit: Payer: Self-pay

## 2024-03-18 ENCOUNTER — Other Ambulatory Visit (HOSPITAL_BASED_OUTPATIENT_CLINIC_OR_DEPARTMENT_OTHER): Payer: Self-pay

## 2024-03-18 DIAGNOSIS — Z6831 Body mass index (BMI) 31.0-31.9, adult: Secondary | ICD-10-CM | POA: Diagnosis not present

## 2024-03-18 DIAGNOSIS — E782 Mixed hyperlipidemia: Secondary | ICD-10-CM | POA: Diagnosis not present

## 2024-03-18 DIAGNOSIS — E669 Obesity, unspecified: Secondary | ICD-10-CM | POA: Diagnosis not present

## 2024-03-18 DIAGNOSIS — I10 Essential (primary) hypertension: Secondary | ICD-10-CM | POA: Diagnosis not present

## 2024-03-18 DIAGNOSIS — E559 Vitamin D deficiency, unspecified: Secondary | ICD-10-CM | POA: Diagnosis not present

## 2024-03-18 MED ORDER — BUPROPION HCL ER (XL) 300 MG PO TB24
300.0000 mg | ORAL_TABLET | Freq: Every day | ORAL | 1 refills | Status: DC
  Filled 2024-03-18: qty 90, 90d supply, fill #0

## 2024-03-18 MED ORDER — SUMATRIPTAN SUCCINATE 100 MG PO TABS
100.0000 mg | ORAL_TABLET | Freq: Every day | ORAL | 3 refills | Status: AC
  Filled 2024-03-18: qty 9, 5d supply, fill #0
  Filled 2024-03-18: qty 18, 22d supply, fill #0
  Filled 2024-03-18: qty 18, 30d supply, fill #0
  Filled 2024-06-07: qty 18, 30d supply, fill #1

## 2024-03-18 MED ORDER — TRETINOIN 0.05 % EX CREA
TOPICAL_CREAM | CUTANEOUS | 1 refills | Status: AC
  Filled 2024-03-18 – 2024-06-07 (×2): qty 45, 30d supply, fill #0

## 2024-03-18 MED ORDER — HYDROXYZINE HCL 25 MG PO TABS
25.0000 mg | ORAL_TABLET | Freq: Every day | ORAL | 1 refills | Status: AC
  Filled 2024-06-07: qty 90, 90d supply, fill #0

## 2024-03-18 MED ORDER — VITAMIN D (ERGOCALCIFEROL) 1.25 MG (50000 UNIT) PO CAPS
50000.0000 [IU] | ORAL_CAPSULE | ORAL | 0 refills | Status: DC
  Filled 2024-03-18: qty 12, 84d supply, fill #0

## 2024-03-18 MED ORDER — ONDANSETRON 4 MG PO TBDP
4.0000 mg | ORAL_TABLET | Freq: Three times a day (TID) | ORAL | 0 refills | Status: AC
  Filled 2024-03-18 – 2024-03-31 (×3): qty 20, 7d supply, fill #0

## 2024-03-18 MED ORDER — ROPINIROLE HCL 0.25 MG PO TABS
0.2500 mg | ORAL_TABLET | Freq: Three times a day (TID) | ORAL | 2 refills | Status: DC
  Filled 2024-03-18 – 2024-06-07 (×2): qty 270, 90d supply, fill #0

## 2024-03-19 ENCOUNTER — Other Ambulatory Visit: Payer: Self-pay

## 2024-03-23 ENCOUNTER — Other Ambulatory Visit (HOSPITAL_BASED_OUTPATIENT_CLINIC_OR_DEPARTMENT_OTHER): Payer: Self-pay

## 2024-03-23 MED ORDER — AIMOVIG 140 MG/ML ~~LOC~~ SOAJ
140.0000 mg | SUBCUTANEOUS | 3 refills | Status: AC
  Filled 2024-03-23 – 2024-03-26 (×3): qty 3, 90d supply, fill #0
  Filled 2024-07-19: qty 3, 90d supply, fill #1

## 2024-03-26 ENCOUNTER — Other Ambulatory Visit: Payer: Self-pay

## 2024-03-26 ENCOUNTER — Other Ambulatory Visit (HOSPITAL_BASED_OUTPATIENT_CLINIC_OR_DEPARTMENT_OTHER): Payer: Self-pay

## 2024-03-30 ENCOUNTER — Other Ambulatory Visit (HOSPITAL_COMMUNITY): Payer: Self-pay

## 2024-03-30 ENCOUNTER — Other Ambulatory Visit (HOSPITAL_BASED_OUTPATIENT_CLINIC_OR_DEPARTMENT_OTHER): Payer: Self-pay

## 2024-03-31 ENCOUNTER — Other Ambulatory Visit: Payer: Self-pay

## 2024-03-31 ENCOUNTER — Other Ambulatory Visit (HOSPITAL_BASED_OUTPATIENT_CLINIC_OR_DEPARTMENT_OTHER): Payer: Self-pay

## 2024-03-31 MED ORDER — ZEPBOUND 10 MG/0.5ML ~~LOC~~ SOAJ
10.0000 mg | SUBCUTANEOUS | 0 refills | Status: DC
  Filled 2024-03-31: qty 2, 28d supply, fill #0

## 2024-04-01 ENCOUNTER — Other Ambulatory Visit (HOSPITAL_BASED_OUTPATIENT_CLINIC_OR_DEPARTMENT_OTHER): Payer: Self-pay

## 2024-04-01 ENCOUNTER — Ambulatory Visit: Admitting: Family Medicine

## 2024-04-02 ENCOUNTER — Other Ambulatory Visit (HOSPITAL_BASED_OUTPATIENT_CLINIC_OR_DEPARTMENT_OTHER): Payer: Self-pay

## 2024-04-02 DIAGNOSIS — H524 Presbyopia: Secondary | ICD-10-CM | POA: Diagnosis not present

## 2024-04-03 ENCOUNTER — Other Ambulatory Visit (HOSPITAL_BASED_OUTPATIENT_CLINIC_OR_DEPARTMENT_OTHER): Payer: Self-pay

## 2024-04-06 ENCOUNTER — Other Ambulatory Visit (HOSPITAL_BASED_OUTPATIENT_CLINIC_OR_DEPARTMENT_OTHER): Payer: Self-pay

## 2024-04-21 DIAGNOSIS — Z83719 Family history of colon polyps, unspecified: Secondary | ICD-10-CM | POA: Diagnosis not present

## 2024-04-21 DIAGNOSIS — Z950 Presence of cardiac pacemaker: Secondary | ICD-10-CM | POA: Diagnosis not present

## 2024-04-21 DIAGNOSIS — Z1211 Encounter for screening for malignant neoplasm of colon: Secondary | ICD-10-CM | POA: Diagnosis not present

## 2024-04-21 DIAGNOSIS — K5900 Constipation, unspecified: Secondary | ICD-10-CM | POA: Diagnosis not present

## 2024-04-28 DIAGNOSIS — E782 Mixed hyperlipidemia: Secondary | ICD-10-CM | POA: Diagnosis not present

## 2024-04-28 DIAGNOSIS — I10 Essential (primary) hypertension: Secondary | ICD-10-CM | POA: Diagnosis not present

## 2024-04-28 DIAGNOSIS — E559 Vitamin D deficiency, unspecified: Secondary | ICD-10-CM | POA: Diagnosis not present

## 2024-04-28 DIAGNOSIS — Z6831 Body mass index (BMI) 31.0-31.9, adult: Secondary | ICD-10-CM | POA: Diagnosis not present

## 2024-04-28 DIAGNOSIS — E669 Obesity, unspecified: Secondary | ICD-10-CM | POA: Diagnosis not present

## 2024-05-03 ENCOUNTER — Other Ambulatory Visit (HOSPITAL_BASED_OUTPATIENT_CLINIC_OR_DEPARTMENT_OTHER): Payer: Self-pay

## 2024-05-03 ENCOUNTER — Other Ambulatory Visit: Payer: Self-pay | Admitting: Family Medicine

## 2024-05-04 ENCOUNTER — Other Ambulatory Visit (HOSPITAL_BASED_OUTPATIENT_CLINIC_OR_DEPARTMENT_OTHER): Payer: Self-pay

## 2024-05-04 ENCOUNTER — Other Ambulatory Visit: Payer: Self-pay

## 2024-05-04 MED ORDER — METOPROLOL SUCCINATE ER 50 MG PO TB24
ORAL_TABLET | ORAL | 0 refills | Status: DC
  Filled 2024-05-04: qty 135, 45d supply, fill #0

## 2024-05-05 ENCOUNTER — Other Ambulatory Visit (HOSPITAL_BASED_OUTPATIENT_CLINIC_OR_DEPARTMENT_OTHER): Payer: Self-pay

## 2024-05-05 ENCOUNTER — Other Ambulatory Visit: Payer: Self-pay

## 2024-05-05 MED ORDER — JOURNAVX 50 MG PO TABS
1.0000 | ORAL_TABLET | Freq: Two times a day (BID) | ORAL | 0 refills | Status: AC
  Filled 2024-05-05: qty 180, 90d supply, fill #0

## 2024-05-06 ENCOUNTER — Other Ambulatory Visit (HOSPITAL_BASED_OUTPATIENT_CLINIC_OR_DEPARTMENT_OTHER): Payer: Self-pay

## 2024-05-08 ENCOUNTER — Ambulatory Visit
Admission: RE | Admit: 2024-05-08 | Discharge: 2024-05-08 | Disposition: A | Discharge status: Home / Self Care | Source: Ambulatory Visit | Attending: Family Medicine | Admitting: Family Medicine

## 2024-05-08 ENCOUNTER — Other Ambulatory Visit: Payer: Self-pay

## 2024-05-08 ENCOUNTER — Emergency Department (HOSPITAL_BASED_OUTPATIENT_CLINIC_OR_DEPARTMENT_OTHER)
Admission: EM | Admit: 2024-05-08 | Discharge: 2024-05-08 | Disposition: A | Discharge status: Home / Self Care | Attending: Emergency Medicine | Admitting: Emergency Medicine

## 2024-05-08 ENCOUNTER — Encounter (HOSPITAL_BASED_OUTPATIENT_CLINIC_OR_DEPARTMENT_OTHER): Payer: Self-pay | Admitting: Emergency Medicine

## 2024-05-08 ENCOUNTER — Emergency Department (HOSPITAL_BASED_OUTPATIENT_CLINIC_OR_DEPARTMENT_OTHER)

## 2024-05-08 VITALS — BP 156/93 | HR 88 | Temp 98.5°F | Resp 18

## 2024-05-08 DIAGNOSIS — K5900 Constipation, unspecified: Secondary | ICD-10-CM | POA: Insufficient documentation

## 2024-05-08 DIAGNOSIS — Z7982 Long term (current) use of aspirin: Secondary | ICD-10-CM | POA: Diagnosis not present

## 2024-05-08 DIAGNOSIS — R1030 Lower abdominal pain, unspecified: Secondary | ICD-10-CM | POA: Diagnosis not present

## 2024-05-08 DIAGNOSIS — R1085 Abdominal pain of multiple sites: Secondary | ICD-10-CM | POA: Diagnosis not present

## 2024-05-08 DIAGNOSIS — E039 Hypothyroidism, unspecified: Secondary | ICD-10-CM | POA: Insufficient documentation

## 2024-05-08 DIAGNOSIS — Z87442 Personal history of urinary calculi: Secondary | ICD-10-CM | POA: Diagnosis not present

## 2024-05-08 DIAGNOSIS — N200 Calculus of kidney: Secondary | ICD-10-CM | POA: Diagnosis not present

## 2024-05-08 DIAGNOSIS — N3000 Acute cystitis without hematuria: Secondary | ICD-10-CM

## 2024-05-08 DIAGNOSIS — R7401 Elevation of levels of liver transaminase levels: Secondary | ICD-10-CM | POA: Insufficient documentation

## 2024-05-08 DIAGNOSIS — Z9049 Acquired absence of other specified parts of digestive tract: Secondary | ICD-10-CM | POA: Diagnosis not present

## 2024-05-08 DIAGNOSIS — Z8673 Personal history of transient ischemic attack (TIA), and cerebral infarction without residual deficits: Secondary | ICD-10-CM | POA: Diagnosis not present

## 2024-05-08 DIAGNOSIS — K56609 Unspecified intestinal obstruction, unspecified as to partial versus complete obstruction: Secondary | ICD-10-CM | POA: Diagnosis not present

## 2024-05-08 DIAGNOSIS — I10 Essential (primary) hypertension: Secondary | ICD-10-CM | POA: Diagnosis not present

## 2024-05-08 LAB — COMPREHENSIVE METABOLIC PANEL WITH GFR
ALT: 104 U/L — ABNORMAL HIGH (ref 0–44)
AST: 54 U/L — ABNORMAL HIGH (ref 15–41)
Albumin: 4.3 g/dL (ref 3.5–5.0)
Alkaline Phosphatase: 57 U/L (ref 38–126)
Anion gap: 9 (ref 5–15)
BUN: 9 mg/dL (ref 6–20)
CO2: 26 mmol/L (ref 22–32)
Calcium: 9.2 mg/dL (ref 8.9–10.3)
Chloride: 106 mmol/L (ref 98–111)
Creatinine, Ser: 0.71 mg/dL (ref 0.44–1.00)
GFR, Estimated: >60 mL/min (ref >60)
Glucose, Bld: 74 mg/dL (ref 70–99)
Potassium: 3.8 mmol/L (ref 3.5–5.1)
Sodium: 140 mmol/L (ref 135–145)
Total Bilirubin: 0.4 mg/dL (ref 0.0–1.2)
Total Protein: 6.8 g/dL (ref 6.5–8.1)

## 2024-05-08 LAB — POCT URINE DIPSTICK
Bilirubin, UA: NEGATIVE
Blood, UA: NEGATIVE
Glucose, UA: NEGATIVE mg/dL
Ketones, POC UA: NEGATIVE mg/dL
Nitrite, UA: NEGATIVE
POC PROTEIN,UA: NEGATIVE
Spec Grav, UA: <=1.005 — AB (ref 1.010–1.025)
Urobilinogen, UA: 0.2 U/dL
pH, UA: 6 (ref 5.0–8.0)

## 2024-05-08 LAB — CBC WITH DIFFERENTIAL/PLATELET
Abs Immature Granulocytes: 0.02 K/uL (ref 0.00–0.07)
Basophils Absolute: 0 K/uL (ref 0.0–0.1)
Basophils Relative: 0 %
Eosinophils Absolute: 0.2 K/uL (ref 0.0–0.5)
Eosinophils Relative: 3 %
HCT: 38.1 % (ref 36.0–46.0)
Hemoglobin: 13.4 g/dL (ref 12.0–15.0)
Immature Granulocytes: 0 %
Lymphocytes Relative: 36 %
Lymphs Abs: 2.1 K/uL (ref 0.7–4.0)
MCH: 30.2 pg (ref 26.0–34.0)
MCHC: 35.2 g/dL (ref 30.0–36.0)
MCV: 85.8 fL (ref 80.0–100.0)
Monocytes Absolute: 0.7 K/uL (ref 0.1–1.0)
Monocytes Relative: 12 %
Neutro Abs: 2.9 K/uL (ref 1.7–7.7)
Neutrophils Relative %: 49 %
Platelets: 204 K/uL (ref 150–400)
RBC: 4.44 MIL/uL (ref 3.87–5.11)
RDW: 12.3 % (ref 11.5–15.5)
WBC: 5.9 K/uL (ref 4.0–10.5)
nRBC: 0 % (ref 0.0–0.2)

## 2024-05-08 LAB — LIPASE, BLOOD: Lipase: 67 U/L — ABNORMAL HIGH (ref 11–51)

## 2024-05-08 MED ORDER — KETOROLAC TROMETHAMINE 15 MG/ML IJ SOLN
15.0000 mg | Freq: Once | INTRAMUSCULAR | Status: AC
  Administered 2024-05-08: 15 mg via INTRAVENOUS
  Filled 2024-05-08: qty 1

## 2024-05-08 MED ORDER — DICYCLOMINE HCL 20 MG PO TABS: 20.0000 mg | ORAL_TABLET | Freq: Two times a day (BID) | ORAL | 0 refills | Status: DC

## 2024-05-08 MED ORDER — SODIUM CHLORIDE 0.9 % IV BOLUS
500.0000 mL | Freq: Once | INTRAVENOUS | Status: AC
  Administered 2024-05-08: 500 mL via INTRAVENOUS

## 2024-05-08 MED ORDER — IOHEXOL 300 MG/ML  SOLN: 100.0000 mL | Freq: Once | INTRAMUSCULAR | Status: DC | PRN

## 2024-05-08 MED ORDER — CEFDINIR 300 MG PO CAPS: 300.0000 mg | ORAL_CAPSULE | Freq: Two times a day (BID) | ORAL | 0 refills | Status: AC

## 2024-05-08 MED ORDER — IOHEXOL 300 MG/ML  SOLN
100.0000 mL | Freq: Once | INTRAMUSCULAR | Status: AC | PRN
  Administered 2024-05-08: 100 mL via INTRAVENOUS

## 2024-05-08 MED ORDER — DICYCLOMINE HCL 10 MG PO CAPS
10.0000 mg | ORAL_CAPSULE | Freq: Once | ORAL | Status: AC
  Administered 2024-05-08: 10 mg via ORAL
  Filled 2024-05-08: qty 1

## 2024-05-08 MED ORDER — ONDANSETRON HCL 4 MG/2ML IJ SOLN
4.0000 mg | Freq: Once | INTRAMUSCULAR | Status: AC
  Administered 2024-05-08: 4 mg via INTRAVENOUS
  Filled 2024-05-08: qty 2

## 2024-05-08 NOTE — Discharge Instructions (Addendum)
 Start taking MiraLAX.  Make sure that you are drinking lots of water .  I will give you a prescription for dicyclomine  for the cramping.  Your liver enzymes were mildly elevated.  These need to be rechecked by your primary care doctor.  Follow-up with your doctor within the next few days.  Return to the emergency room for any worsening symptoms.

## 2024-05-08 NOTE — Discharge Instructions (Addendum)
 Advised patient take medication as directed with food to completion.  Encouraged to increase daily water  intake to 64 ounces per day while taking this medication.  Advised if symptoms worsen and/or unresolved please go to [facility] ED for further evaluation.  Advised we will follow-up with urine culture results once received.

## 2024-05-08 NOTE — ED Notes (Signed)
 Pt requesting nausea and non-narcotic pain medications. Provider notified. Pending new orders.

## 2024-05-08 NOTE — ED Provider Notes (Signed)
 [name]    CSN: [id number] Arrival date & time: 05/08/24  1820      History   Chief Complaint Chief Complaint  Patient presents with   Abdominal Pain    Entered by patient    HPI Carmen [name] is a 43 y.o. female.   HPI 43 year old female presents with abdominal pain onset 2 weeks.  Patient is unsure if it is gas and has been taking simethicone  for 10 days.  Also patient reports having abdominal cramps and right sided flank pain.  Reports pain is not letting up.  PMH significant for obesity, migraines, and TIA.  Past Medical History:  Diagnosis Date   Back pain    Costochondritis 09/13/2010   GERD (gastroesophageal reflux disease)    Heart palpitations    long history of    Heartburn    History of chest pain    History of fatigue    History of kidney stones    History of migraine headaches    History of tilt table evaluation    IMPRESSION: Positive tilt table study with significant cardioinhibitory  response.   Hyperlipidemia    Hypertension    Hypothyroidism    Kidney stones    history of   migraine    Neurocardiogenic syncope    Palpitations    Pericarditis 09/13/2010   Presence of permanent cardiac pacemaker    PSVT (paroxysmal supraventricular tachycardia)    Right ureteral calculus    Sleeping difficulties    Stress    Stroke (HCC) 06/15/2019   no residual deficits, tpa given at time of stroke   Stroke [facility])    tia jan 2022   SVD (spontaneous vaginal delivery)    x 1   Syncope and collapse    neurocardiogenic syncopy started age 41   Wears glasses     Patient Active Problem List   Diagnosis Date Noted   Left knee pain 01/23/2024   Low back pain 09/18/2023   Obesity 08/03/2022   Trigger point of left shoulder region 05/31/2022   Neck muscle spasm 04/12/2022   Somatic dysfunction of spine, sacral 08/24/2021   AC joint pain 07/12/2021   Bilateral shoulder pain 06/07/2021   Acute right-sided weakness 07/25/2020   Depression  with anxiety 07/25/2020   Hypokalemia 07/25/2020   Mood disorder 07/07/2020   Other fatigue 07/07/2020   SOBOE (shortness of breath on exertion) 07/07/2020   Essential hypertension 07/07/2020   Hypothyroidism 07/07/2020   Migraine 07/07/2020   Vitamin D  deficiency 07/07/2020   At risk for impaired metabolic function 07/07/2020   Hyperlipidemia 06/18/2019   Stroke-like episode s/p tPA 06/15/2019   Pelvic prolapse 11/17/2017   Incontinence of urine in female 11/14/2017   PSVT (paroxysmal supraventricular tachycardia)    Heart palpitations    Syncope and collapse    Sleeping difficulties    Pericarditis 09/13/2010   Syncope 09/13/2010   Costochondritis 09/13/2010    Past Surgical History:  Procedure Laterality Date   ANTERIOR AND POSTERIOR REPAIR N/A 11/14/2017   Procedure: ANTERIOR (CYSTOCELE) AND POSTERIOR REPAIR (RECTOCELE);  Surgeon: Darcel Pool, MD;  Location: [facility];  Service: Gynecology;  Laterality: N/A;   BILATERAL SALPINGECTOMY Bilateral 11/14/2017   Procedure: BILATERAL SALPINGECTOMY;  Surgeon: Darcel Pool, MD;  Location: [facility];  Service: Gynecology;  Laterality: Bilateral;   BLADDER SUSPENSION Bilateral 11/14/2017   Procedure: TRANSVAGINAL TAPE (TVT) PROCEDURE;  Surgeon: Henry Slough, MD;  Location: [facility];  Service: Gynecology;  Laterality: Bilateral;  CHOLECYSTECTOMY  11/2007   Cholecystitis - Laparoscopic cholecystectomy with intraoperative  cholangiogram.   CYSTOSCOPY N/A 11/14/2017   Procedure: CYSTOSCOPY;  Surgeon: Henry Slough, MD;  Location: [facility];  Service: Gynecology;  Laterality: N/A;   CYSTOSCOPY WITH RETROGRADE PYELOGRAM, URETEROSCOPY AND STENT PLACEMENT Right 06/25/2019   Procedure: CYSTOSCOPY WITH RETROGRADE PYELOGRAM, AND RIGHT  STENT PLACEMENT;  Surgeon: Elisabeth Valli BIRCH, MD;  Location: [facility];  Service: Urology;  Laterality: Right;   CYSTOSCOPY WITH RETROGRADE PYELOGRAM, URETEROSCOPY AND STENT PLACEMENT Right 07/07/2019   Procedure:  CYSTOSCOPY , URETEROSCOPY AND STENT REPLACEMENT;  Surgeon: Elisabeth Valli BIRCH, MD;  Location: [facility];  Service: Urology;  Laterality: Right;   CYSTOSCOPY WITH RETROGRADE PYELOGRAM, URETEROSCOPY AND STENT PLACEMENT Right 03/23/2021   Procedure: CYSTOSCOPY WITH RETROGRADE PYELOGRAM, URETEROSCOPY AND STENT PLACEMENT;  Surgeon: Matilda Senior, MD;  Location: [facility];  Service: Urology;  Laterality: Right;   CYSTOSCOPY/URETEROSCOPY/HOLMIUM LASER/STENT PLACEMENT Left 07/05/2022   Procedure: CYSTOSCOPY LEFT RETROGRADE PYELOGRAM URETEROSCOPY/HOLMIUM LASER/STENT PLACEMENT;  Surgeon: Nieves Cough, MD;  Location: [facility];  Service: Urology;  Laterality: Left;   HOLMIUM LASER APPLICATION Right 07/07/2019   Procedure: HOLMIUM LASER APPLICATION;  Surgeon: Elisabeth Valli BIRCH, MD;  Location: [facility];  Service: Urology;  Laterality: Right;   HOLMIUM LASER APPLICATION Right 03/23/2021   Procedure: HOLMIUM LASER APPLICATION;  Surgeon: Matilda Senior, MD;  Location: [facility];  Service: Urology;  Laterality: Right;   PACEMAKER INSERTION  2016   Tilt Table Study  07/17/2005   Positive tilt table study with significant cardioinhibitory  response.   VAGINAL HYSTERECTOMY N/A 11/14/2017   Procedure: HYSTERECTOMY VAGINAL;  Surgeon: Darcel Pool, MD;  Location: [facility];  Service: Gynecology;  Laterality: N/A;   WISDOM TOOTH EXTRACTION     age 59    OB History     Gravida  2   Para  1   Term  1   Preterm      AB      Living  1      SAB      IAB      Ectopic      Multiple      Live Births               Home Medications    Prior to Admission medications   Medication Sig Start Date End Date Taking? Authorizing Provider  aspirin  EC 81 MG tablet Take 81 mg by mouth daily. Swallow whole.   Yes [provider]  cefdinir (OMNICEF) 300 MG capsule Take 1 capsule (300 mg total) by mouth 2 (two) times daily for  7 days. 05/08/24 05/15/24 Yes Teddy Sharper, FNP  clonazePAM  (KLONOPIN ) 0.5 MG tablet Take 1 tablet (0.5 mg total) by mouth 2 (two) times daily.Max 2 tablets daily 03/02/24  Yes   Erenumab -aooe (AIMOVIG ) 140 MG/ML SOAJ Inject 140 mg into the skin every 30 (thirty) days. 09/10/23  Yes   Erenumab -aooe (AIMOVIG ) 140 MG/ML SOAJ Inject 140 mg into the skin every 30 (thirty) days. 03/23/24  Yes   estradiol  (VIVELLE -DOT) 0.025 MG/24HR Place 1 patch onto the skin 2 (two) times a week. 08/19/23  Yes   estradiol  (VIVELLE -DOT) 0.05 MG/24HR patch Place 1 patch (0.05 mg total) onto the skin 2 (two) times a week. 10/21/23  Yes   estradiol  (VIVELLE -DOT) 0.05 MG/24HR patch Place 1 patch (0.05 mg total) onto the skin 2 (two) times a week. 12/12/23  Yes   eszopiclone  (LUNESTA ) 2  MG TABS tablet Take 1 tablet (2 mg total) by mouth at bedtime. 03/02/24  Yes   hydrOXYzine  (ATARAX ) 25 MG tablet Take 1 tablet (25 mg total) by mouth daily. 03/18/24  Yes   levothyroxine  (SYNTHROID ) 150 MCG tablet Take 1 tablet (150 mcg total) by mouth daily before breakfast. 02/07/24  Yes Smith, Zachary M, DO  metoprolol  succinate (TOPROL -XL) 50 MG 24 hr tablet Take 2 tablets (100 mg total) by mouth in the morning AND 1 tablet (50 mg total) every evening. 05/04/24  Yes   mirabegron  ER (MYRBETRIQ ) 50 MG TB24 tablet Take 1 tablet (50 mg total) by mouth daily. 02/20/24  Yes   ondansetron  (ZOFRAN -ODT) 4 MG disintegrating tablet Take 1 tablet (4 mg total) by mouth every 8 (eight) hours as needed for nausea for up to 7 days. 03/18/24  Yes   pantoprazole  (PROTONIX ) 40 MG tablet Take 1 tablet (40 mg total) by mouth daily. 06/24/23  Yes   Rimegepant Sulfate  (NURTEC) 75 MG TBDP Take 1 tablet (75 mg total) by mouth daily as needed. 02/06/24  Yes   SUMAtriptan  (IMITREX ) 100 MG tablet Take 1 tablet by mouth once daily as needed for migraine. May repeat ONCE in 2 hours. 04/25/23  Yes   Suzetrigine  (JOURNAVX ) 50 MG TABS Take 1 tablet by mouth 2 (two) times daily.  05/05/24  Yes Claudene Arthea HERO, DO  thyroid  (ARMOUR THYROID ) 15 MG tablet Take 1 tablet (15 mg total) by mouth daily. 02/07/24  Yes Claudene Arthea M, DO  tretinoin  (RETIN-A ) 0.05 % cream Apply 1 Application topically at bedtime. 09/02/23  Yes   tretinoin  (RETIN-A ) 0.05 % cream Apply topically at bedtime. 03/18/24  Yes   Vitamin D , Ergocalciferol , (DRISDOL ) 1.25 MG (50000 UNIT) CAPS capsule Take 1 capsule (50,000 Units total) by mouth every 7 (seven) days. 03/18/24  Yes Claudene Arthea HERO, DO  buPROPion  (WELLBUTRIN  XL) 150 MG 24 hr tablet Take 1 tablet (150 mg total) by mouth in the morning. 08/03/22     buPROPion  (WELLBUTRIN  XL) 150 MG 24 hr tablet Take 1 tablet (150 mg total) by mouth daily. 11/18/23     buPROPion  (WELLBUTRIN  XL) 300 MG 24 hr tablet Take 1 tablet (300 mg total) by mouth daily. 12/12/23     clotrimazole -betamethasone  (LOTRISONE ) cream APPLY TO THE AFFECTED AND SURROUNDING AREAS OF SKIN BY TOPICAL ROUTE 2 TIMES PER DAY IN THE MORNING AND EVENING FOR 2 WEEKS 04/08/23     meloxicam  (MOBIC ) 15 MG tablet Take 1 tablet (15 mg total) by mouth daily. 01/15/24   Leonce Katz, DO  metFORMIN  (GLUCOPHAGE -XR) 500 MG 24 hr tablet Take 1 tablet (500 mg total) by mouth every morning with a meal. 03/25/23     metoprolol  tartrate (LOPRESSOR ) 25 MG tablet Take 1 tablet (25 mg total) by mouth 2 (two) times daily. 12/26/22     predniSONE  (DELTASONE ) 50 MG tablet Take 1 tablet (50 mg total) by mouth daily. 07/26/23   Claudene Arthea HERO, DO  rOPINIRole  (REQUIP ) 0.25 MG tablet Take 1 tablet (0.25 mg total) by mouth 3 (three) times daily. 07/12/23     rOPINIRole  (REQUIP ) 0.25 MG tablet Take 1 tablet (0.25 mg total) by mouth 3 (three) times daily. 03/18/24     SUMAtriptan  (IMITREX ) 100 MG tablet Take 1 tablet by mouth once daily as needed for migraine. May repeat ONCE in 2 hours. Max 2 pills in 24 hours 03/18/24     tirzepatide  (ZEPBOUND ) 10 MG/0.5ML Pen Inject 10 mg into the skin once  a week. 11/28/23     tirzepatide   (ZEPBOUND ) 10 MG/0.5ML Pen Inject 10mg  Subcutaneous weekly 03/31/24     hydrochlorothiazide  (HYDRODIURIL ) 25 MG tablet Take 1 tablet (25 mg total) by mouth in the morning. 10/29/22 01/15/23      Family History Family History  Problem Relation Age of Onset   Hypertension Mother    Heart disease Mother    Cancer Mother        breast   Breast cancer Mother 77   Diabetes Mother    Hyperlipidemia Mother    Thyroid  disease Mother    Depression Mother    Anxiety disorder Mother    Bipolar disorder Mother    Liver disease Mother    Alcoholism Mother    Obesity Mother    Heart attack Father    Hyperlipidemia Father    Hepatitis C Father    Diabetes Father    Hypertension Father    Heart disease Father    Sudden Cardiac Death Father    Depression Father    Anxiety disorder Father    Liver disease Father    Alcoholism Father    Drug abuse Father    Obesity Father    Cancer Maternal Aunt        Breast   Breast cancer Maternal Aunt 48   Breast cancer Maternal Grandmother 74    Social History Social History   Tobacco Use   Smoking status: Never   Smokeless tobacco: Never  Vaping Use   Vaping status: Never Used  Substance Use Topics   Alcohol use: Yes    Comment: very rare   Drug use: Never     Allergies   Tape, Prochlorperazine, Quinolones, and Reglan  [metoclopramide ]   Review of Systems Review of Systems  Gastrointestinal:  Positive for abdominal pain.  All other systems reviewed and are negative.    Physical Exam Triage Vital Signs ED Triage Vitals  Encounter Vitals Group     BP      Girls Systolic BP Percentile      Girls Diastolic BP Percentile      Boys Systolic BP Percentile      Boys Diastolic BP Percentile      Pulse      Resp      Temp      Temp src      SpO2      Weight      Height      Head Circumference      Peak Flow      Pain Score      Pain Loc      Pain Education      Exclude from Growth Chart    No data found.  Updated Vital  Signs BP (!) 156/93 (BP Location: Right Arm)   Pulse 88   Temp 98.5 F (36.9 C) (Oral)   Resp 18   LMP  (LMP Unknown)   SpO2 99%    Physical Exam Vitals and nursing note reviewed.  Constitutional:      General: She is not in acute distress.    Appearance: She is well-developed. She is obese. She is not ill-appearing.  HENT:     Head: Normocephalic and atraumatic.     Mouth/Throat:     Mouth: Mucous membranes are moist.     Pharynx: Oropharynx is clear.  Eyes:     Extraocular Movements: Extraocular movements intact.     Pupils: Pupils are equal, round, and reactive to light.  Cardiovascular:     Rate and Rhythm: Normal rate and regular rhythm.     Heart sounds: Normal heart sounds. No murmur heard. Pulmonary:     Effort: Pulmonary effort is normal.     Breath sounds: Normal breath sounds. No wheezing, rhonchi or rales.  Abdominal:     General: Bowel sounds are absent.     Palpations: Abdomen is soft. There is no shifting dullness, fluid wave, hepatomegaly, splenomegaly, mass or pulsatile mass.     Tenderness: There is abdominal tenderness in the left lower quadrant. There is right CVA tenderness. There is no left CVA tenderness, guarding or rebound. Negative signs include Murphy's sign and McBurney's sign.     Hernia: No hernia is present.  Skin:    General: Skin is warm and dry.  Neurological:     General: No focal deficit present.     Mental Status: She is alert and oriented to person, place, and time.      UC Treatments / Results  Labs (all labs ordered are listed, but only abnormal results are displayed) Labs Reviewed  POCT URINE DIPSTICK - Abnormal; Notable for the following components:      Result Value   Spec Grav, UA <=1.005 (*)    Leukocytes, UA Trace (*)    All other components within normal limits  URINE CULTURE    EKG   Radiology No results found.  Procedures Procedures (including critical care time)  Medications Ordered in UC Medications -  No data to display  Initial Impression / Assessment and Plan / UC Course  I have reviewed the triage vital signs and the nursing notes.  Pertinent labs & imaging results that were available during my care of the patient were reviewed by me and considered in my medical decision making (see chart for details).     MDM: 1.  Acute cystitis without hematuria-UA revealed above, urine culture ordered Rx'd cefdinir 300 mg capsule: Take 1 capsule twice daily x 7 days; 2.  Lower abdominal pain-Advised patient take medication as directed with food to completion.  Encouraged to increase daily water  intake to 64 ounces per day while taking this medication.  Advised if symptoms worsen and/or unresolved please go to [facility] ED for further evaluation.  Advised we will follow-up with urine culture results once received.  Patient discharged home/ED, hemodynamically stable. Final Clinical Impressions(s) / UC Diagnoses   Final diagnoses:  Acute cystitis without hematuria  Lower abdominal pain     Discharge Instructions      Advised patient take medication as directed with food to completion.  Encouraged to increase daily water  intake to 64 ounces per day while taking this medication.  Advised if symptoms worsen and/or unresolved please go to [facility] ED for further evaluation.  Advised we will follow-up with urine culture results once received.     ED Prescriptions     Medication Sig Dispense Auth. Provider   cefdinir (OMNICEF) 300 MG capsule Take 1 capsule (300 mg total) by mouth 2 (two) times daily for 7 days. 14 capsule Almira Phetteplace, FNP      PDMP not reviewed this encounter.   Teddy Sharper, FNP 05/08/24 1902

## 2024-05-08 NOTE — ED Provider Notes (Signed)
 [place] [facility] AT [facility] Provider Note   CSN: [id number] Arrival date & time: 05/08/24  1910     Patient presents with: Abdominal Pain   Carmen [name] is a 43 y.o. female.   Patient is a 43 year old female who presents with abdominal pain.  She said it started about 2 weeks ago.  It has been fairly constant but waxes and wanes in intensity.  She has had a history of kidney stones and ovarian cysts.  Her pain is mostly across her lower abdomen that radiates around to her right flank.  She denies any change in her stools.  She is having normal bowel movements.  No fevers.  She has had a little bit of nausea but no vomiting.  No vaginal bleeding or discharge.  She is status post hysterectomy and cholecystectomy.  She went to urgent care today and they were concerned that she had a UTI and started her on cefdinir.  They did send a urine culture which is pending.       Prior to Admission medications   Medication Sig Start Date End Date Taking? Authorizing Provider  dicyclomine  (BENTYL ) 20 MG tablet Take 1 tablet (20 mg total) by mouth 2 (two) times daily. 05/08/24  Yes Lenor Hollering, MD  aspirin  EC 81 MG tablet Take 81 mg by mouth daily. Swallow whole.    [provider]  buPROPion  (WELLBUTRIN  XL) 150 MG 24 hr tablet Take 1 tablet (150 mg total) by mouth in the morning. 08/03/22     buPROPion  (WELLBUTRIN  XL) 150 MG 24 hr tablet Take 1 tablet (150 mg total) by mouth daily. 11/18/23     buPROPion  (WELLBUTRIN  XL) 300 MG 24 hr tablet Take 1 tablet (300 mg total) by mouth daily. 12/12/23     cefdinir (OMNICEF) 300 MG capsule Take 1 capsule (300 mg total) by mouth 2 (two) times daily for 7 days. 05/08/24 05/15/24  Teddy Sharper, FNP  clonazePAM  (KLONOPIN ) 0.5 MG tablet Take 1 tablet (0.5 mg total) by mouth 2 (two) times daily.Max 2 tablets daily 03/02/24     clotrimazole -betamethasone  (LOTRISONE ) cream APPLY TO THE AFFECTED AND SURROUNDING AREAS OF SKIN  BY TOPICAL ROUTE 2 TIMES PER DAY IN THE MORNING AND EVENING FOR 2 WEEKS 04/08/23     Erenumab -aooe (AIMOVIG ) 140 MG/ML SOAJ Inject 140 mg into the skin every 30 (thirty) days. 09/10/23     Erenumab -aooe (AIMOVIG ) 140 MG/ML SOAJ Inject 140 mg into the skin every 30 (thirty) days. 03/23/24     estradiol  (VIVELLE -DOT) 0.025 MG/24HR Place 1 patch onto the skin 2 (two) times a week. 08/19/23     estradiol  (VIVELLE -DOT) 0.05 MG/24HR patch Place 1 patch (0.05 mg total) onto the skin 2 (two) times a week. 10/21/23     estradiol  (VIVELLE -DOT) 0.05 MG/24HR patch Place 1 patch (0.05 mg total) onto the skin 2 (two) times a week. 12/12/23     eszopiclone  (LUNESTA ) 2 MG TABS tablet Take 1 tablet (2 mg total) by mouth at bedtime. 03/02/24     hydrOXYzine  (ATARAX ) 25 MG tablet Take 1 tablet (25 mg total) by mouth daily. 03/18/24     levothyroxine  (SYNTHROID ) 150 MCG tablet Take 1 tablet (150 mcg total) by mouth daily before breakfast. 02/07/24   Claudene Arthea HERO, DO  meloxicam  (MOBIC ) 15 MG tablet Take 1 tablet (15 mg total) by mouth daily. 01/15/24   Leonce Katz, DO  metFORMIN  (GLUCOPHAGE -XR) 500 MG 24 hr tablet Take 1 tablet (500  mg total) by mouth every morning with a meal. 03/25/23     metoprolol  succinate (TOPROL -XL) 50 MG 24 hr tablet Take 2 tablets (100 mg total) by mouth in the morning AND 1 tablet (50 mg total) every evening. 05/04/24     metoprolol  tartrate (LOPRESSOR ) 25 MG tablet Take 1 tablet (25 mg total) by mouth 2 (two) times daily. 12/26/22     mirabegron  ER (MYRBETRIQ ) 50 MG TB24 tablet Take 1 tablet (50 mg total) by mouth daily. 02/20/24     ondansetron  (ZOFRAN -ODT) 4 MG disintegrating tablet Take 1 tablet (4 mg total) by mouth every 8 (eight) hours as needed for nausea for up to 7 days. 03/18/24     pantoprazole  (PROTONIX ) 40 MG tablet Take 1 tablet (40 mg total) by mouth daily. 06/24/23     predniSONE  (DELTASONE ) 50 MG tablet Take 1 tablet (50 mg total) by mouth daily. 07/26/23   Claudene Arthea HERO, DO   Rimegepant Sulfate  (NURTEC) 75 MG TBDP Take 1 tablet (75 mg total) by mouth daily as needed. 02/06/24     rOPINIRole  (REQUIP ) 0.25 MG tablet Take 1 tablet (0.25 mg total) by mouth 3 (three) times daily. 07/12/23     rOPINIRole  (REQUIP ) 0.25 MG tablet Take 1 tablet (0.25 mg total) by mouth 3 (three) times daily. 03/18/24     SUMAtriptan  (IMITREX ) 100 MG tablet Take 1 tablet by mouth once daily as needed for migraine. May repeat ONCE in 2 hours. 04/25/23     SUMAtriptan  (IMITREX ) 100 MG tablet Take 1 tablet by mouth once daily as needed for migraine. May repeat ONCE in 2 hours. Max 2 pills in 24 hours 03/18/24     Suzetrigine  (JOURNAVX ) 50 MG TABS Take 1 tablet by mouth 2 (two) times daily. 05/05/24   Claudene Arthea HERO, DO  thyroid  (ARMOUR THYROID ) 15 MG tablet Take 1 tablet (15 mg total) by mouth daily. 02/07/24   Smith, Zachary M, DO  tirzepatide  (ZEPBOUND ) 10 MG/0.5ML Pen Inject 10 mg into the skin once a week. 11/28/23     tirzepatide  (ZEPBOUND ) 10 MG/0.5ML Pen Inject 10mg  Subcutaneous weekly 03/31/24     tretinoin  (RETIN-A ) 0.05 % cream Apply 1 Application topically at bedtime. 09/02/23     tretinoin  (RETIN-A ) 0.05 % cream Apply topically at bedtime. 03/18/24     Vitamin D , Ergocalciferol , (DRISDOL ) 1.25 MG (50000 UNIT) CAPS capsule Take 1 capsule (50,000 Units total) by mouth every 7 (seven) days. 03/18/24   Claudene Arthea HERO, DO  hydrochlorothiazide  (HYDRODIURIL ) 25 MG tablet Take 1 tablet (25 mg total) by mouth in the morning. 10/29/22 01/15/23      Allergies: Tape, Prochlorperazine, Quinolones, and Reglan  [metoclopramide ]    Review of Systems  Constitutional:  Negative for chills, diaphoresis, fatigue and fever.  HENT:  Negative for congestion, rhinorrhea and sneezing.   Eyes: Negative.   Respiratory:  Negative for cough, chest tightness and shortness of breath.   Cardiovascular:  Negative for chest pain and leg swelling.  Gastrointestinal:  Positive for abdominal pain and nausea. Negative for  diarrhea and vomiting.  Genitourinary:  Negative for difficulty urinating, flank pain and frequency.  Musculoskeletal:  Negative for arthralgias and back pain.  Skin:  Negative for rash.  Neurological:  Negative for dizziness, speech difficulty, weakness, numbness and headaches.    Updated Vital Signs BP 132/88 (BP Location: Right Arm)   Pulse 72   Temp 98.2 F (36.8 C) (Oral)   Resp 16   Ht 5' 2 (1.575 m)  Wt 81.6 kg   LMP  (LMP Unknown)   SpO2 100%   BMI 32.92 kg/m   Physical Exam Constitutional:      Appearance: She is well-developed.  HENT:     Head: Normocephalic and atraumatic.  Eyes:     Pupils: Pupils are equal, round, and reactive to light.  Cardiovascular:     Rate and Rhythm: Normal rate and regular rhythm.     Heart sounds: Normal heart sounds.  Pulmonary:     Effort: Pulmonary effort is normal. No respiratory distress.     Breath sounds: Normal breath sounds. No wheezing or rales.  Chest:     Chest wall: No tenderness.  Abdominal:     General: Bowel sounds are normal.     Palpations: Abdomen is soft.     Tenderness: There is abdominal tenderness in the right lower quadrant, suprapubic area and left lower quadrant. There is no guarding or rebound.  Musculoskeletal:        General: Normal range of motion.     Cervical back: Normal range of motion and neck supple.  Lymphadenopathy:     Cervical: No cervical adenopathy.  Skin:    General: Skin is warm and dry.     Findings: No rash.  Neurological:     Mental Status: She is alert and oriented to person, place, and time.     (all labs ordered are listed, but only abnormal results are displayed) Labs Reviewed  COMPREHENSIVE METABOLIC PANEL WITH GFR - Abnormal; Notable for the following components:      Result Value   AST 54 (*)    ALT 104 (*)    All other components within normal limits  LIPASE, BLOOD - Abnormal; Notable for the following components:   Lipase 67 (*)    All other components within  normal limits  CBC WITH DIFFERENTIAL/PLATELET    EKG: None  Radiology: CT ABDOMEN PELVIS W CONTRAST Result Date: 05/08/2024 EXAM: CT ABDOMEN AND PELVIS WITH CONTRAST 05/08/2024 08:50:00 PM TECHNIQUE: CT of the abdomen and pelvis was performed with the administration of 100 mL of iohexol  (OMNIPAQUE ) 300 MG/ML solution. Multiplanar reformatted images are provided for review. Automated exposure control, iterative reconstruction, and/or weight-based adjustment of the mA/kV was utilized to reduce the radiation dose to as low as reasonably achievable. COMPARISON: 12/03/2022 CLINICAL HISTORY: LLQ abdominal pain; RLQ abdominal pain FINDINGS: LOWER CHEST: Mid pacemaker leads again noted within the upper right heart. No acute abnormality within the visualized chest. LIVER: The liver is unremarkable. GALLBLADDER AND BILE DUCTS: Status post cholecystectomy. No biliary ductal dilatation. SPLEEN: No acute abnormality. PANCREAS: No acute abnormality. ADRENAL GLANDS: No acute abnormality. KIDNEYS, URETERS AND BLADDER: Small bilateral nonobstructing nephrolithiasis again noted with multiple nonobstructing calculi noted within the lower pole of the kidneys bilaterally measuring up to 5 mm in greatest dimension, minimally increased since prior examination. No hydronephrosis. No perinephric or periureteral stranding. Urinary bladder is unremarkable. GI AND BOWEL: Moderate colonic stool burden with evidence of obstruction. The stomach, small bowel, and large bowel are otherwise unremarkable. PERITONEUM AND RETROPERITONEUM: No ascites. No free air. VASCULATURE: Aorta is normal in caliber. LYMPH NODES: No lymphadenopathy. REPRODUCTIVE ORGANS: Status post hysterectomy. No adnexal mass. Residual ovaries are unremarkable. BONES AND SOFT TISSUES: Osseous structures are age appropriate. No acute bone abnormality. No lytic or blastic bone lesion. No focal soft tissue abnormality. IMPRESSION: 1. Moderate colonic stool burden without  evidence of bowel obstruction 2. Small bilateral nonobstructing nephrolithiasis, mildly increased since prior  exam. No hydronephrosis. No urolithiasis. Electronically signed by: Dorethia Molt MD 05/08/2024 09:01 PM EST RP Workstation: [id number]     Procedures   Medications Ordered in the ED  sodium chloride  0.9 % bolus 500 mL (0 mLs Intravenous Stopped 05/08/24 2045)  ondansetron  (ZOFRAN ) injection 4 mg (4 mg Intravenous Given 05/08/24 2017)  iohexol  (OMNIPAQUE ) 300 MG/ML solution 100 mL (100 mLs Intravenous Contrast Given 05/08/24 2054)  ketorolac  (TORADOL ) 15 MG/ML injection 15 mg (15 mg Intravenous Given 05/08/24 2116)  dicyclomine  (BENTYL ) capsule 10 mg (10 mg Oral Given 05/08/24 2136)                                    Medical Decision Making Amount and/or Complexity of Data Reviewed Labs: ordered. Radiology: ordered.  Risk Prescription drug management.   This patient presents to the ED for concern of abdominal pain, this involves an extensive number of treatment options, and is a complaint that carries with it a high risk of complications and morbidity.  I considered the following differential and admission for this acute, potentially life threatening condition.  The differential diagnosis includes kidney stone, pyelonephritis, bowel obstruction, pelvic etiology of the pain, ovarian torsion, colitis, diverticulitis, appendicitis, pancreatitis  MDM:    Patient is a 43 year old female who presents with abdominal pain.  Has been going on about 2 weeks.  She says she is having normal bowel movements.  No fever or vomiting.  Her abdomen is mildly tender across the lower abdomen.  Labs are reassuring.  Her white count is normal.  Her LFTs are mildly elevated.  Her lipase is mildly elevated.  She reports that her lipase is chronically elevated and she previously saw a gastroenterologist who did a complete evaluation of the pancreas and advised her that it was not concerning.  Her LFTs  are not typically elevated.  She does not have pain in the right upper quadrant.  CT scan was performed which shows constipation but no other acute abnormality.  She is status postcholecystectomy.  There is no evident dilatation of the common bile duct on the CT scan.  I reviewed urinalysis from the urgent care.  There are some mild concerns for infection.  It was sent for culture.  She is overall well-appearing.  Was given dose of Bentyl  here in the ED.  Encouraged her to use MiraLAX over the weekend to see if her symptoms improved.  She was also given a prescription for Bentyl .  She will follow-up with her PCP if her symptoms are not improving.  Return precautions were given.  I did advise her that her LFTs are going to need to be rechecked by her PCP.  (Labs, imaging, consults)  Labs: I Ordered, and personally interpreted labs.  The pertinent results include: Mild transaminitis, elevated lipase  Imaging Studies ordered: I ordered imaging studies including CT abdomen pelvis I independently visualized and interpreted imaging. I agree with the radiologist interpretation  Additional history obtained from chart review.  External records from outside source obtained and reviewed including prior notes  Cardiac Monitoring: The patient was not maintained on a cardiac monitor.  If on the cardiac monitor, I personally viewed and interpreted the cardiac monitored which showed an underlying rhythm of:    Reevaluation: After the interventions noted above, I reevaluated the patient and found that they have :improved  Social Determinants of Health:    Disposition: Discharged to home  Co  morbidities that complicate the patient evaluation  Past Medical History:  Diagnosis Date   Back pain    Costochondritis 09/13/2010   GERD (gastroesophageal reflux disease)    Heart palpitations    long history of    Heartburn    History of chest pain    History of fatigue    History of kidney stones     History of migraine headaches    History of tilt table evaluation    IMPRESSION: Positive tilt table study with significant cardioinhibitory  response.   Hyperlipidemia    Hypertension    Hypothyroidism    Kidney stones    history of   migraine    Neurocardiogenic syncope    Palpitations    Pericarditis 09/13/2010   Presence of permanent cardiac pacemaker    PSVT (paroxysmal supraventricular tachycardia)    Right ureteral calculus    Sleeping difficulties    Stress    Stroke (HCC) 06/15/2019   no residual deficits, tpa given at time of stroke   Stroke [facility])    tia jan 2022   SVD (spontaneous vaginal delivery)    x 1   Syncope and collapse    neurocardiogenic syncopy started age 45   Wears glasses      Medicines Meds ordered this encounter  Medications   sodium chloride  0.9 % bolus 500 mL   ondansetron  (ZOFRAN ) injection 4 mg   DISCONTD: iohexol  (OMNIPAQUE ) 300 MG/ML solution 100 mL   iohexol  (OMNIPAQUE ) 300 MG/ML solution 100 mL   ketorolac  (TORADOL ) 15 MG/ML injection 15 mg   dicyclomine  (BENTYL ) capsule 10 mg   dicyclomine  (BENTYL ) 20 MG tablet    Sig: Take 1 tablet (20 mg total) by mouth 2 (two) times daily.    Dispense:  20 tablet    Refill:  0    I have reviewed the patients home medicines and have made adjustments as needed  Problem List / ED Course: Problem List Items Addressed This Visit   None Visit Diagnoses       Lower abdominal pain    -  Primary     Constipation, unspecified constipation type         Transaminitis                    Final diagnoses:  Lower abdominal pain  Constipation, unspecified constipation type  Transaminitis    ED Discharge Orders          Ordered    dicyclomine  (BENTYL ) 20 MG tablet  2 times daily        05/08/24 2132               Lenor Hollering, MD 05/08/24 2228

## 2024-05-08 NOTE — ED Triage Notes (Signed)
 Pt c/o lower abd pain x 2 weeks. Also reports R flank pain, x 2 weeks.  Pt feels bloated. LBM- today, normal. Has been off GLP-1 x 1 month.   Denies n/v/d.  Hx of kidney stones, ovarian cyst.

## 2024-05-08 NOTE — ED Triage Notes (Signed)
 Pt reports lower ABD pain x 2 weeks. She ? if it's gas. Has been taking simethicone  x 10 days. States she is having lower abdominal cramps. Also right side flank pain. She states she was on a GLP1 but stopped approx 1 month ago. Pain is not letting up. Last BM was today, normal BM per pt.

## 2024-05-08 NOTE — ED Notes (Signed)
Dr. Belfi at bedside 

## 2024-05-08 NOTE — ED Notes (Signed)
 Report received from [place], [place]. Assuming pt care at this time.

## 2024-05-11 LAB — URINE CULTURE: Culture: NO GROWTH

## 2024-05-12 ENCOUNTER — Other Ambulatory Visit: Payer: Self-pay

## 2024-05-12 ENCOUNTER — Other Ambulatory Visit (HOSPITAL_BASED_OUTPATIENT_CLINIC_OR_DEPARTMENT_OTHER): Payer: Self-pay

## 2024-05-12 MED ORDER — DICYCLOMINE HCL 10 MG PO CAPS
10.0000 mg | ORAL_CAPSULE | Freq: Four times a day (QID) | ORAL | 0 refills | Status: DC
  Filled 2024-05-12 (×2): qty 56, 14d supply, fill #0

## 2024-05-13 ENCOUNTER — Other Ambulatory Visit: Payer: Self-pay

## 2024-05-13 ENCOUNTER — Other Ambulatory Visit (HOSPITAL_BASED_OUTPATIENT_CLINIC_OR_DEPARTMENT_OTHER): Payer: Self-pay

## 2024-05-13 MED ORDER — HYOSCYAMINE SULFATE 0.125 MG PO TABS
0.1250 mg | ORAL_TABLET | ORAL | 0 refills | Status: DC | PRN
  Filled 2024-05-13: qty 60, 10d supply, fill #0

## 2024-05-14 ENCOUNTER — Other Ambulatory Visit (HOSPITAL_BASED_OUTPATIENT_CLINIC_OR_DEPARTMENT_OTHER): Payer: Self-pay

## 2024-05-14 DIAGNOSIS — K5900 Constipation, unspecified: Secondary | ICD-10-CM | POA: Diagnosis not present

## 2024-05-14 DIAGNOSIS — R140 Abdominal distension (gaseous): Secondary | ICD-10-CM | POA: Diagnosis not present

## 2024-05-14 DIAGNOSIS — R748 Abnormal levels of other serum enzymes: Secondary | ICD-10-CM | POA: Diagnosis not present

## 2024-05-14 DIAGNOSIS — R109 Unspecified abdominal pain: Secondary | ICD-10-CM | POA: Diagnosis not present

## 2024-05-19 ENCOUNTER — Other Ambulatory Visit (HOSPITAL_BASED_OUTPATIENT_CLINIC_OR_DEPARTMENT_OTHER): Payer: Self-pay

## 2024-05-19 DIAGNOSIS — K5900 Constipation, unspecified: Secondary | ICD-10-CM | POA: Diagnosis not present

## 2024-05-19 DIAGNOSIS — Z950 Presence of cardiac pacemaker: Secondary | ICD-10-CM | POA: Diagnosis not present

## 2024-05-19 DIAGNOSIS — N200 Calculus of kidney: Secondary | ICD-10-CM | POA: Diagnosis not present

## 2024-05-19 MED ORDER — BISACODYL 10 MG RE SUPP
10.0000 mg | Freq: Every day | RECTAL | 3 refills | Status: DC | PRN
  Filled 2024-05-19: qty 12, 12d supply, fill #0

## 2024-05-20 ENCOUNTER — Other Ambulatory Visit (HOSPITAL_BASED_OUTPATIENT_CLINIC_OR_DEPARTMENT_OTHER): Payer: Self-pay

## 2024-05-20 MED ORDER — SUTAB 1479-225-188 MG PO TABS
ORAL_TABLET | ORAL | 0 refills | Status: DC
  Filled 2024-05-20: qty 24, 2d supply, fill #0

## 2024-05-27 ENCOUNTER — Other Ambulatory Visit (HOSPITAL_BASED_OUTPATIENT_CLINIC_OR_DEPARTMENT_OTHER): Payer: Self-pay

## 2024-06-02 DIAGNOSIS — K6289 Other specified diseases of anus and rectum: Secondary | ICD-10-CM | POA: Diagnosis not present

## 2024-06-02 DIAGNOSIS — D125 Benign neoplasm of sigmoid colon: Secondary | ICD-10-CM | POA: Diagnosis not present

## 2024-06-02 DIAGNOSIS — K644 Residual hemorrhoidal skin tags: Secondary | ICD-10-CM | POA: Diagnosis not present

## 2024-06-02 DIAGNOSIS — R1084 Generalized abdominal pain: Secondary | ICD-10-CM | POA: Diagnosis not present

## 2024-06-02 DIAGNOSIS — R194 Change in bowel habit: Secondary | ICD-10-CM | POA: Diagnosis not present

## 2024-06-07 ENCOUNTER — Other Ambulatory Visit: Payer: Self-pay | Admitting: Family Medicine

## 2024-06-08 ENCOUNTER — Other Ambulatory Visit: Payer: Self-pay

## 2024-06-08 ENCOUNTER — Other Ambulatory Visit (HOSPITAL_BASED_OUTPATIENT_CLINIC_OR_DEPARTMENT_OTHER): Payer: Self-pay

## 2024-06-08 MED ORDER — THYROID 15 MG PO TABS
15.0000 mg | ORAL_TABLET | Freq: Every day | ORAL | 0 refills | Status: AC
  Filled 2024-06-08: qty 90, 90d supply, fill #0

## 2024-06-08 MED ORDER — LEVOTHYROXINE SODIUM 150 MCG PO TABS
150.0000 ug | ORAL_TABLET | Freq: Every day | ORAL | 0 refills | Status: AC
  Filled 2024-06-08: qty 90, 90d supply, fill #0

## 2024-06-09 DIAGNOSIS — R748 Abnormal levels of other serum enzymes: Secondary | ICD-10-CM | POA: Diagnosis not present

## 2024-06-12 DIAGNOSIS — R7401 Elevation of levels of liver transaminase levels: Secondary | ICD-10-CM | POA: Diagnosis not present

## 2024-06-15 DIAGNOSIS — R399 Unspecified symptoms and signs involving the genitourinary system: Secondary | ICD-10-CM | POA: Diagnosis not present

## 2024-06-16 ENCOUNTER — Other Ambulatory Visit (HOSPITAL_BASED_OUTPATIENT_CLINIC_OR_DEPARTMENT_OTHER): Payer: Self-pay

## 2024-06-16 ENCOUNTER — Ambulatory Visit: Payer: Self-pay

## 2024-06-16 MED ORDER — WEGOVY 0.25 MG/0.5ML ~~LOC~~ SOAJ
0.2500 mg | SUBCUTANEOUS | 0 refills | Status: DC
  Filled 2024-06-16: qty 2, 28d supply, fill #0

## 2024-06-17 ENCOUNTER — Other Ambulatory Visit (HOSPITAL_BASED_OUTPATIENT_CLINIC_OR_DEPARTMENT_OTHER): Payer: Self-pay

## 2024-06-20 ENCOUNTER — Other Ambulatory Visit (HOSPITAL_BASED_OUTPATIENT_CLINIC_OR_DEPARTMENT_OTHER): Payer: Self-pay

## 2024-06-21 ENCOUNTER — Encounter (HOSPITAL_BASED_OUTPATIENT_CLINIC_OR_DEPARTMENT_OTHER): Payer: Self-pay | Admitting: Emergency Medicine

## 2024-06-21 ENCOUNTER — Emergency Department (HOSPITAL_BASED_OUTPATIENT_CLINIC_OR_DEPARTMENT_OTHER)

## 2024-06-21 ENCOUNTER — Emergency Department (HOSPITAL_BASED_OUTPATIENT_CLINIC_OR_DEPARTMENT_OTHER)
Admission: EM | Admit: 2024-06-21 | Discharge: 2024-06-21 | Disposition: A | Discharge status: Home / Self Care | Attending: Emergency Medicine | Admitting: Emergency Medicine

## 2024-06-21 DIAGNOSIS — N202 Calculus of kidney with calculus of ureter: Secondary | ICD-10-CM | POA: Insufficient documentation

## 2024-06-21 DIAGNOSIS — N23 Unspecified renal colic: Secondary | ICD-10-CM

## 2024-06-21 DIAGNOSIS — N210 Calculus in bladder: Secondary | ICD-10-CM | POA: Insufficient documentation

## 2024-06-21 DIAGNOSIS — R10A2 Flank pain, left side: Secondary | ICD-10-CM | POA: Diagnosis present

## 2024-06-21 LAB — CBC WITH DIFFERENTIAL/PLATELET
Abs Immature Granulocytes: 0.04 K/uL (ref 0.00–0.07)
Basophils Absolute: 0 K/uL (ref 0.0–0.1)
Basophils Relative: 0 %
Eosinophils Absolute: 0.2 K/uL (ref 0.0–0.5)
Eosinophils Relative: 2 %
HCT: 42.5 % (ref 36.0–46.0)
Hemoglobin: 14.9 g/dL (ref 12.0–15.0)
Immature Granulocytes: 0 %
Lymphocytes Relative: 20 %
Lymphs Abs: 2.2 K/uL (ref 0.7–4.0)
MCH: 30.4 pg (ref 26.0–34.0)
MCHC: 35.1 g/dL (ref 30.0–36.0)
MCV: 86.7 fL (ref 80.0–100.0)
Monocytes Absolute: 0.7 K/uL (ref 0.1–1.0)
Monocytes Relative: 6 %
Neutro Abs: 7.7 K/uL (ref 1.7–7.7)
Neutrophils Relative %: 72 %
Platelets: 217 K/uL (ref 150–400)
RBC: 4.9 MIL/uL (ref 3.87–5.11)
RDW: 12.6 % (ref 11.5–15.5)
WBC: 10.9 K/uL — ABNORMAL HIGH (ref 4.0–10.5)
nRBC: 0 % (ref 0.0–0.2)

## 2024-06-21 LAB — COMPREHENSIVE METABOLIC PANEL WITH GFR
ALT: 92 U/L — ABNORMAL HIGH (ref 0–44)
AST: 46 U/L — ABNORMAL HIGH (ref 15–41)
Albumin: 4.5 g/dL (ref 3.5–5.0)
Alkaline Phosphatase: 63 U/L (ref 38–126)
Anion gap: 12 (ref 5–15)
BUN: 13 mg/dL (ref 6–20)
CO2: 21 mmol/L — ABNORMAL LOW (ref 22–32)
Calcium: 9.2 mg/dL (ref 8.9–10.3)
Chloride: 104 mmol/L (ref 98–111)
Creatinine, Ser: 0.73 mg/dL (ref 0.44–1.00)
GFR, Estimated: >60 mL/min
Glucose, Bld: 89 mg/dL (ref 70–99)
Potassium: 4.3 mmol/L (ref 3.5–5.1)
Sodium: 137 mmol/L (ref 135–145)
Total Bilirubin: 0.5 mg/dL (ref 0.0–1.2)
Total Protein: 7.5 g/dL (ref 6.5–8.1)

## 2024-06-21 LAB — URINALYSIS, W/ REFLEX TO CULTURE (INFECTION SUSPECTED): RBC / HPF: NONE SEEN RBC/hpf (ref 0–5)

## 2024-06-21 LAB — HCG, SERUM, QUALITATIVE: Preg, Serum: NEGATIVE

## 2024-06-21 MED ORDER — TAMSULOSIN HCL 0.4 MG PO CAPS: 0.4000 mg | ORAL_CAPSULE | Freq: Every day | ORAL | 0 refills | Status: AC

## 2024-06-21 MED ORDER — MORPHINE SULFATE (PF) 4 MG/ML IV SOLN
4.0000 mg | Freq: Once | INTRAVENOUS | Status: AC
  Administered 2024-06-21: 4 mg via INTRAVENOUS
  Filled 2024-06-21: qty 1

## 2024-06-21 MED ORDER — ONDANSETRON HCL 4 MG/2ML IJ SOLN
4.0000 mg | Freq: Once | INTRAMUSCULAR | Status: AC
  Administered 2024-06-21: 4 mg via INTRAVENOUS
  Filled 2024-06-21: qty 2

## 2024-06-21 MED ORDER — KETOROLAC TROMETHAMINE 30 MG/ML IJ SOLN
30.0000 mg | Freq: Once | INTRAMUSCULAR | Status: AC
  Administered 2024-06-21: 30 mg via INTRAVENOUS
  Filled 2024-06-21: qty 1

## 2024-06-21 MED ORDER — OXYCODONE-ACETAMINOPHEN 5-325 MG PO TABS: 1.0000 | ORAL_TABLET | Freq: Four times a day (QID) | ORAL | 0 refills | Status: AC | PRN

## 2024-06-21 MED ORDER — DIPHENHYDRAMINE HCL 50 MG/ML IJ SOLN
50.0000 mg | Freq: Once | INTRAMUSCULAR | Status: AC
  Administered 2024-06-21: 50 mg via INTRAVENOUS
  Filled 2024-06-21: qty 1

## 2024-06-21 NOTE — ED Triage Notes (Signed)
 Pt c/o LT flank pain and urethral pain; has been treated for UTIs; has known bilateral kidney stones; pain has increased today

## 2024-06-21 NOTE — ED Notes (Addendum)
 Walked in room to re-assess pain , pt reports chest tightness across her chest , sts feels like a tight bra. EKG done immediately and shown to ED provider ,  pt denies shortness of breath , continuous cardiac monitoring

## 2024-06-21 NOTE — ED Provider Notes (Signed)
 " [facility] Provider Note   CSN: [id number] Arrival date & time: 06/21/24  1333     Patient presents with: Flank Pain   Carmen [name] is a 43 y.o. female history of kidney stone here presenting with flank pain.  Patient states that she has been having flank pain for about 2 weeks now.  She states that it is worsening on the left side.  She does have some dysuria and finished a course of Macrobid .  She then saw her doctor several days ago and was prescribed Keflex  and had more pain so was prescribed Cipro  since yesterday.  Patient states that she is also on Pyridium and her urine is orange in color.  However she still has persistent urethral pain and also flank pain.  She is concerned that she may have a kidney stone.  Denies any fevers or vomiting   The history is provided by the patient.       Prior to Admission medications  Medication Sig Start Date End Date Taking? Authorizing Provider  aspirin  EC 81 MG tablet Take 81 mg by mouth daily. Swallow whole.    [provider]  bisacodyl  (DULCOLAX) 10 MG suppository Insert 1 suppository rectally once a day as needed. 05/19/24     buPROPion  (WELLBUTRIN  XL) 150 MG 24 hr tablet Take 1 tablet (150 mg total) by mouth in the morning. 08/03/22     buPROPion  (WELLBUTRIN  XL) 150 MG 24 hr tablet Take 1 tablet (150 mg total) by mouth daily. 11/18/23     buPROPion  (WELLBUTRIN  XL) 300 MG 24 hr tablet Take 1 tablet (300 mg total) by mouth daily. 12/12/23     clonazePAM  (KLONOPIN ) 0.5 MG tablet Take 1 tablet (0.5 mg total) by mouth 2 (two) times daily.Max 2 tablets daily 03/02/24     clotrimazole -betamethasone  (LOTRISONE ) cream APPLY TO THE AFFECTED AND SURROUNDING AREAS OF SKIN BY TOPICAL ROUTE 2 TIMES PER DAY IN THE MORNING AND EVENING FOR 2 WEEKS 04/08/23     dicyclomine  (BENTYL ) 10 MG capsule Take 10 mg (1 capsule) by mouth 4 (four) times daily. 05/12/24     dicyclomine  (BENTYL ) 20 MG tablet Take  1 tablet (20 mg total) by mouth 2 (two) times daily. 05/08/24   Lenor Hollering, MD  Erenumab -aooe (AIMOVIG ) 140 MG/ML SOAJ Inject 140 mg into the skin every 30 (thirty) days. 09/10/23     Erenumab -aooe (AIMOVIG ) 140 MG/ML SOAJ Inject 140 mg into the skin every 30 (thirty) days. 03/23/24     estradiol  (VIVELLE -DOT) 0.025 MG/24HR Place 1 patch onto the skin 2 (two) times a week. 08/19/23     estradiol  (VIVELLE -DOT) 0.05 MG/24HR patch Place 1 patch (0.05 mg total) onto the skin 2 (two) times a week. 10/21/23     estradiol  (VIVELLE -DOT) 0.05 MG/24HR patch Place 1 patch (0.05 mg total) onto the skin 2 (two) times a week. 12/12/23     eszopiclone  (LUNESTA ) 2 MG TABS tablet Take 1 tablet (2 mg total) by mouth at bedtime. 03/02/24     hydrOXYzine  (ATARAX ) 25 MG tablet Take 1 tablet (25 mg total) by mouth daily. 03/18/24     hyoscyamine  (LEVSIN ) 0.125 MG tablet Take 1 tablet (0.125 mg total) by mouth every 4 (four) hours as needed for cramping. 05/13/24     levothyroxine  (SYNTHROID ) 150 MCG tablet Take 1 tablet (150 mcg total) by mouth daily before breakfast. 06/08/24   Claudene Arthea HERO, DO  meloxicam  (MOBIC ) 15 MG tablet  Take 1 tablet (15 mg total) by mouth daily. 01/15/24   Leonce Katz, DO  metFORMIN  (GLUCOPHAGE -XR) 500 MG 24 hr tablet Take 1 tablet (500 mg total) by mouth every morning with a meal. 03/25/23     metoprolol  succinate (TOPROL -XL) 50 MG 24 hr tablet Take 2 tablets (100 mg total) by mouth in the morning AND 1 tablet (50 mg total) every evening. 05/04/24     metoprolol  tartrate (LOPRESSOR ) 25 MG tablet Take 1 tablet (25 mg total) by mouth 2 (two) times daily. 12/26/22     mirabegron  ER (MYRBETRIQ ) 50 MG TB24 tablet Take 1 tablet (50 mg total) by mouth daily. 02/20/24     ondansetron  (ZOFRAN -ODT) 4 MG disintegrating tablet Take 1 tablet (4 mg total) by mouth every 8 (eight) hours as needed for nausea for up to 7 days. 03/18/24     pantoprazole  (PROTONIX ) 40 MG tablet Take 1 tablet (40 mg total) by mouth  daily. 06/24/23     predniSONE  (DELTASONE ) 50 MG tablet Take 1 tablet (50 mg total) by mouth daily. 07/26/23   Claudene Arthea HERO, DO  Rimegepant Sulfate  (NURTEC) 75 MG TBDP Take 1 tablet (75 mg total) by mouth daily as needed. 02/06/24     rOPINIRole  (REQUIP ) 0.25 MG tablet Take 1 tablet (0.25 mg total) by mouth 3 (three) times daily. 07/12/23     semaglutide -weight management (WEGOVY ) 0.25 MG/0.5ML SOAJ SQ injection Inject 0.25 mg into the skin once a week. 06/16/24     Sodium Sulfate-Mag Sulfate-KCl (SUTAB ) [id number] MG TABS Use as directed. 05/20/24     Sodium Sulfate-Mag Sulfate-KCl (SUTAB ) 903-125-1359 MG TABS 12 tablets the first dose the evening before and second dose the morning of colonoscopy Orally as directed 05/20/24     SUMAtriptan  (IMITREX ) 100 MG tablet Take 1 tablet by mouth once daily as needed for migraine. May repeat ONCE in 2 hours. 04/25/23     SUMAtriptan  (IMITREX ) 100 MG tablet Take 1 tablet by mouth once daily as needed for migraine. May repeat ONCE in 2 hours. Max 2 pills in 24 hours 03/18/24     Suzetrigine  (JOURNAVX ) 50 MG TABS Take 1 tablet by mouth 2 (two) times daily. 05/05/24   Claudene Arthea HERO, DO  thyroid  (ARMOUR THYROID ) 15 MG tablet Take 1 tablet (15 mg total) by mouth daily. 06/08/24   Claudene Arthea HERO, DO  tirzepatide  (ZEPBOUND ) 10 MG/0.5ML Pen Inject 10 mg into the skin once a week. 11/28/23     tirzepatide  (ZEPBOUND ) 10 MG/0.5ML Pen Inject 10mg  Subcutaneous weekly 03/31/24     tretinoin  (RETIN-A ) 0.05 % cream Apply 1 Application topically at bedtime. 09/02/23     tretinoin  (RETIN-A ) 0.05 % cream Apply topically at bedtime. 03/18/24     Vitamin D , Ergocalciferol , (DRISDOL ) 1.25 MG (50000 UNIT) CAPS capsule Take 1 capsule (50,000 Units total) by mouth every 7 (seven) days. 03/18/24   Claudene Arthea HERO, DO  hydrochlorothiazide  (HYDRODIURIL ) 25 MG tablet Take 1 tablet (25 mg total) by mouth in the morning. 10/29/22 01/15/23      Allergies: Tape, Prochlorperazine, Quinolones,  and Reglan  [metoclopramide ]    Review of Systems  Genitourinary:  Positive for flank pain.  All other systems reviewed and are negative.   Updated Vital Signs BP (!) 157/108 (BP Location: Right Arm)   Pulse 93   Temp 98.3 F (36.8 C)   Resp 18   Ht 5' 2.5 (1.588 m)   Wt 81.6 kg   LMP  (LMP Unknown)   SpO2  98%   BMI 32.40 kg/m   Physical Exam Vitals and nursing note reviewed.  Constitutional:      Comments: Uncomfortable  HENT:     Head: Normocephalic.     Nose: Nose normal.     Mouth/Throat:     Mouth: Mucous membranes are moist.  Eyes:     Extraocular Movements: Extraocular movements intact.     Pupils: Pupils are equal, round, and reactive to light.  Cardiovascular:     Rate and Rhythm: Normal rate and regular rhythm.     Pulses: Normal pulses.     Heart sounds: Normal heart sounds.  Pulmonary:     Effort: Pulmonary effort is normal.     Breath sounds: Normal breath sounds.  Abdominal:     Comments: Mild left CVA tenderness  Musculoskeletal:        General: Normal range of motion.     Cervical back: Normal range of motion and neck supple.  Skin:    General: Skin is warm.     Capillary Refill: Capillary refill takes less than 2 seconds.  Neurological:     General: No focal deficit present.     Mental Status: She is oriented to person, place, and time.  Psychiatric:        Mood and Affect: Mood normal.        Behavior: Behavior normal.     (all labs ordered are listed, but only abnormal results are displayed) Labs Reviewed  URINALYSIS, W/ REFLEX TO CULTURE (INFECTION SUSPECTED) - Abnormal; Notable for the following components:      Result Value   Color, Urine ORANGE (*)    APPearance TURBID (*)    Glucose, UA   (*)    Value: TEST NOT REPORTED DUE TO COLOR INTERFERENCE OF URINE PIGMENT   Hgb urine dipstick   (*)    Value: TEST NOT REPORTED DUE TO COLOR INTERFERENCE OF URINE PIGMENT   Bilirubin Urine   (*)    Value: TEST NOT REPORTED DUE TO COLOR  INTERFERENCE OF URINE PIGMENT   Ketones, ur   (*)    Value: TEST NOT REPORTED DUE TO COLOR INTERFERENCE OF URINE PIGMENT   Protein, ur   (*)    Value: TEST NOT REPORTED DUE TO COLOR INTERFERENCE OF URINE PIGMENT   Nitrite   (*)    Value: TEST NOT REPORTED DUE TO COLOR INTERFERENCE OF URINE PIGMENT   Leukocytes,Ua   (*)    Value: TEST NOT REPORTED DUE TO COLOR INTERFERENCE OF URINE PIGMENT   Bacteria, UA FEW (*)    All other components within normal limits    EKG: None  Radiology: No results found.   Procedures   Medications Ordered in the ED - No data to display                                  Medical Decision Making Carmen [name] is a 43 y.o. female here presenting with left flank pain and urinary frequency.  Consider UTI versus pyelonephritis versus renal colic.  Plan to get CBC and CMP and pregnancy test and urinalysis and CT renal stone.  Will give Toradol  and pain meds and reassess  5:57 PM I reviewed patient's labs and CBC and CMP unremarkable.  UA showed interfering pigment likely from Pyridium use but no obvious UTI.  Patient's recent urine cultures are negative.  Patient CT scan showed 6 mm  stone inside the bladder.  Will start patient on Flomax  and pain medicine.  Patient has follow-up with Dr. Nieves from urology  Amount and/or Complexity of Data Reviewed Labs: ordered. Radiology: ordered.  Risk Prescription drug management.   Final diagnoses:  None    ED Discharge Orders     None          Patt Alm Macho, MD 06/21/24 1757  "

## 2024-06-21 NOTE — Discharge Instructions (Addendum)
 As we discussed you have a 6 mm stone inside your bladder  I have prescribed Flomax   I have also prescribed Percocet as needed for pain.  You received morphine  in the ED  See Dr. Nieves for follow-up  Return to ER if you have severe bladder pain or spasms or trouble urinating

## 2024-06-22 ENCOUNTER — Other Ambulatory Visit (HOSPITAL_BASED_OUTPATIENT_CLINIC_OR_DEPARTMENT_OTHER): Payer: Self-pay

## 2024-06-22 ENCOUNTER — Other Ambulatory Visit: Payer: Self-pay

## 2024-06-22 MED ORDER — ESTRADIOL 0.05 MG/24HR TD PTTW
1.0000 | MEDICATED_PATCH | TRANSDERMAL | 1 refills | Status: AC
  Filled 2024-06-22: qty 24, 84d supply, fill #0

## 2024-06-22 NOTE — Progress Notes (Unsigned)
 " [facility] [address] [place] [address] Phone: [phone] Subjective:   Carmen [name], am serving as a scribe for Dr. Arthea Claudene.  I'm seeing this patient by the request  of:  Joeann Clap, MD  CC: Back and neck pain follow-up  YEP:Dlagzrupcz  Carmen [name] is a 43 y.o. female coming in with complaint of back and neck pain. OMT September.  Patient states middle of passing a kidney stone right now. Restless leg has gotten more aggravating. TSH labs today?  Medications patient has been prescribed: Synthroid   Taking: Yes     Patient unfortunately had to go to the emergency department for a kidney stone 2 days ago.  Shown that patient did pass the stone into her bladder and was started on Flomax .  And was already being treated with antibiotics for an infection.    Reviewed prior external information including notes and imaging from previsou exam, outside providers and external EMR if available.   As well as notes that were available from care everywhere and other healthcare systems.  Past medical history, social, surgical and family history all reviewed in electronic medical record.  No pertanent information unless stated regarding to the chief complaint.   Past Medical History:  Diagnosis Date   Back pain    Costochondritis 09/13/2010   GERD (gastroesophageal reflux disease)    Heart palpitations    long history of    Heartburn    History of chest pain    History of fatigue    History of kidney stones    History of migraine headaches    History of tilt table evaluation    IMPRESSION: Positive tilt table study with significant cardioinhibitory  response.   Hyperlipidemia    Hypertension    Hypothyroidism    Kidney stones    history of   migraine    Neurocardiogenic syncope    Palpitations    Pericarditis 09/13/2010   Presence of permanent cardiac pacemaker    PSVT (paroxysmal supraventricular tachycardia)    Right  ureteral calculus    Sleeping difficulties    Stress    Stroke (HCC) 06/15/2019   no residual deficits, tpa given at time of stroke   Stroke [facility])    tia jan 2022   SVD (spontaneous vaginal delivery)    x 1   Syncope and collapse    neurocardiogenic syncopy started age 64   Wears glasses     Allergies[1]    Objective  Blood pressure 100/60, pulse 97, height 5' 2 (1.575 m), weight 186 lb (84.4 kg), SpO2 97%.   General: No apparent distress but appears ill, somewhat pale HEENT: Pupils equal, extraocular movements intact  Respiratory: Patient's speak in full sentences and does not appear short of breath  Cardiovascular: No lower extremity edema, non tender, no erythema  Gait MSK:  Back mild CVA tenderness but nothing severe.  Patient is more suprapubic pain than anywhere else at the moment.       Assessment and Plan:  Low back pain I believe most of it is secondary to the renal stone.  Now seems to be in the bladder.  Following up with urology today.  We discussed potential Toradol .  Patient is going to see the other provider.  Still had elevated white blood cell count and labs and would like to recheck.  Depending on findings may need to consider more broadening coverage with antibiotics.  Once again will defer to  urology at this time.  Syncope and collapse Patient does have history of syncopal episodes and is on Flomax , accompanied with husband and they will monitor.  Hypothyroidism Time to recheck thyroid  labs.  Adjust medications accordingly         The above documentation has been reviewed and is accurate and complete Carmen Azure M Demetre Monaco, DO         Note: This dictation was prepared with Dragon dictation along with smaller phrase technology. Any transcriptional errors that result from this process are unintentional.             [1]  Allergies Allergen Reactions   Tape Hives    Adhesive tape - tegaderm/paper tape ok   Prochlorperazine Other (See  Comments)    Akathisia with compazine in ED on 05/17/19.  Resolved with repeat dose of benadryl .      Quinolones Nausea Only   Reglan  [Metoclopramide ] Other (See Comments)    flinging legs and not feeling right.    "

## 2024-06-23 ENCOUNTER — Encounter: Payer: Self-pay | Admitting: Family Medicine

## 2024-06-23 ENCOUNTER — Ambulatory Visit (INDEPENDENT_AMBULATORY_CARE_PROVIDER_SITE_OTHER): Admitting: Family Medicine

## 2024-06-23 ENCOUNTER — Other Ambulatory Visit: Payer: Self-pay | Admitting: Urology

## 2024-06-23 ENCOUNTER — Other Ambulatory Visit

## 2024-06-23 ENCOUNTER — Ambulatory Visit: Payer: Self-pay | Admitting: Family Medicine

## 2024-06-23 ENCOUNTER — Other Ambulatory Visit (HOSPITAL_BASED_OUTPATIENT_CLINIC_OR_DEPARTMENT_OTHER): Payer: Self-pay

## 2024-06-23 ENCOUNTER — Encounter (HOSPITAL_BASED_OUTPATIENT_CLINIC_OR_DEPARTMENT_OTHER): Payer: Self-pay

## 2024-06-23 VITALS — BP 100/60 | HR 97 | Ht 62.0 in | Wt 186.0 lb

## 2024-06-23 DIAGNOSIS — R55 Syncope and collapse: Secondary | ICD-10-CM

## 2024-06-23 DIAGNOSIS — G8929 Other chronic pain: Secondary | ICD-10-CM

## 2024-06-23 DIAGNOSIS — E039 Hypothyroidism, unspecified: Secondary | ICD-10-CM

## 2024-06-23 DIAGNOSIS — M545 Low back pain, unspecified: Secondary | ICD-10-CM | POA: Diagnosis not present

## 2024-06-23 LAB — COMPREHENSIVE METABOLIC PANEL WITH GFR
ALT: 93 U/L — ABNORMAL HIGH (ref 3–35)
AST: 42 U/L — ABNORMAL HIGH (ref 5–37)
Albumin: 4.4 g/dL (ref 3.5–5.2)
Alkaline Phosphatase: 57 U/L (ref 39–117)
BUN: 11 mg/dL (ref 6–23)
CO2: 28 meq/L (ref 19–32)
Calcium: 9.6 mg/dL (ref 8.4–10.5)
Chloride: 104 meq/L (ref 96–112)
Creatinine, Ser: 0.71 mg/dL (ref 0.40–1.20)
GFR: 102.79 mL/min
Glucose, Bld: 87 mg/dL (ref 70–99)
Potassium: 4.7 meq/L (ref 3.5–5.1)
Sodium: 138 meq/L (ref 135–145)
Total Bilirubin: 0.3 mg/dL (ref 0.2–1.2)
Total Protein: 6.9 g/dL (ref 6.0–8.3)

## 2024-06-23 LAB — T3, FREE: T3, Free: 3.9 pg/mL (ref 2.3–4.2)

## 2024-06-23 LAB — TSH: TSH: 2.71 u[IU]/mL (ref 0.35–5.50)

## 2024-06-23 LAB — T4, FREE: Free T4: 1.09 ng/dL (ref 0.60–1.60)

## 2024-06-23 MED ORDER — METOPROLOL SUCCINATE ER 50 MG PO TB24
50.0000 mg | ORAL_TABLET | Freq: Two times a day (BID) | ORAL | 0 refills | Status: AC
  Filled 2024-06-23: qty 180, 90d supply, fill #0

## 2024-06-23 NOTE — Assessment & Plan Note (Signed)
 I believe most of it is secondary to the renal stone.  Now seems to be in the bladder.  Following up with urology today.  We discussed potential Toradol .  Patient is going to see the other provider.  Still had elevated white blood cell count and labs and would like to recheck.  Depending on findings may need to consider more broadening coverage with antibiotics.  Once again will defer to urology at this time.

## 2024-06-23 NOTE — Assessment & Plan Note (Signed)
 Patient does have history of syncopal episodes and is on Flomax , accompanied with husband and they will monitor.

## 2024-06-23 NOTE — Patient Instructions (Signed)
Labs today See you again in 2-3 months

## 2024-06-23 NOTE — Assessment & Plan Note (Signed)
 Time to recheck thyroid  labs.  Adjust medications accordingly

## 2024-06-24 ENCOUNTER — Ambulatory Visit (HOSPITAL_COMMUNITY)
Admission: RE | Admit: 2024-06-24 | Discharge: 2024-06-24 | Disposition: A | Discharge status: Home / Self Care | Attending: Urology | Admitting: Urology

## 2024-06-24 ENCOUNTER — Encounter (HOSPITAL_COMMUNITY)
Admission: RE | Disposition: A | Discharge status: Home / Self Care | Payer: Self-pay | Source: Home / Self Care | Attending: Urology

## 2024-06-24 ENCOUNTER — Other Ambulatory Visit (HOSPITAL_BASED_OUTPATIENT_CLINIC_OR_DEPARTMENT_OTHER): Payer: Self-pay

## 2024-06-24 ENCOUNTER — Encounter (HOSPITAL_COMMUNITY): Payer: Self-pay | Admitting: Urology

## 2024-06-24 ENCOUNTER — Ambulatory Visit (HOSPITAL_COMMUNITY): Admitting: Anesthesiology

## 2024-06-24 ENCOUNTER — Other Ambulatory Visit: Payer: Self-pay

## 2024-06-24 ENCOUNTER — Ambulatory Visit (HOSPITAL_COMMUNITY)

## 2024-06-24 DIAGNOSIS — K219 Gastro-esophageal reflux disease without esophagitis: Secondary | ICD-10-CM | POA: Insufficient documentation

## 2024-06-24 DIAGNOSIS — Z8673 Personal history of transient ischemic attack (TIA), and cerebral infarction without residual deficits: Secondary | ICD-10-CM | POA: Diagnosis not present

## 2024-06-24 DIAGNOSIS — Z79899 Other long term (current) drug therapy: Secondary | ICD-10-CM | POA: Insufficient documentation

## 2024-06-24 DIAGNOSIS — Z950 Presence of cardiac pacemaker: Secondary | ICD-10-CM | POA: Diagnosis not present

## 2024-06-24 DIAGNOSIS — E119 Type 2 diabetes mellitus without complications: Secondary | ICD-10-CM | POA: Insufficient documentation

## 2024-06-24 DIAGNOSIS — N219 Calculus of lower urinary tract, unspecified: Secondary | ICD-10-CM | POA: Diagnosis not present

## 2024-06-24 DIAGNOSIS — I10 Essential (primary) hypertension: Secondary | ICD-10-CM | POA: Insufficient documentation

## 2024-06-24 DIAGNOSIS — N201 Calculus of ureter: Secondary | ICD-10-CM | POA: Diagnosis present

## 2024-06-24 DIAGNOSIS — E039 Hypothyroidism, unspecified: Secondary | ICD-10-CM | POA: Diagnosis not present

## 2024-06-24 DIAGNOSIS — Z8249 Family history of ischemic heart disease and other diseases of the circulatory system: Secondary | ICD-10-CM | POA: Insufficient documentation

## 2024-06-24 DIAGNOSIS — G709 Myoneural disorder, unspecified: Secondary | ICD-10-CM | POA: Diagnosis not present

## 2024-06-24 HISTORY — PX: CYSTOSCOPY/URETEROSCOPY/HOLMIUM LASER/STENT PLACEMENT: SHX6546

## 2024-06-24 LAB — CBC WITH DIFFERENTIAL/PLATELET
Absolute Lymphocytes: 1925 {cells}/uL (ref 850–3900)
Absolute Monocytes: 497 {cells}/uL (ref 200–950)
Basophils Absolute: 28 {cells}/uL (ref 0–200)
Basophils Relative: 0.4 %
Eosinophils Absolute: 262 {cells}/uL (ref 15–500)
Eosinophils Relative: 3.8 %
HCT: 41.5 % (ref 35.9–46.0)
Hemoglobin: 14.5 g/dL (ref 11.7–15.5)
MCH: 32.2 pg (ref 27.0–33.0)
MCHC: 34.9 g/dL (ref 31.6–35.4)
MCV: 92.2 fL (ref 81.4–101.7)
MPV: 11.4 fL (ref 7.5–12.5)
Monocytes Relative: 7.2 %
Neutro Abs: 4188 {cells}/uL (ref 1500–7800)
Neutrophils Relative %: 60.7 %
Platelets: 235 Thousand/uL (ref 140–400)
RBC: 4.5 Million/uL (ref 3.80–5.10)
RDW: 13 % (ref 11.0–15.0)
Total Lymphocyte: 27.9 %
WBC: 6.9 Thousand/uL (ref 3.8–10.8)

## 2024-06-24 LAB — EXTRA SPECIMEN

## 2024-06-24 SURGERY — CYSTOSCOPY/URETEROSCOPY/HOLMIUM LASER/STENT PLACEMENT
Anesthesia: General | Laterality: Left

## 2024-06-24 MED ORDER — ACETAMINOPHEN 10 MG/ML IV SOLN
INTRAVENOUS | Status: AC
  Filled 2024-06-24: qty 100

## 2024-06-24 MED ORDER — FENTANYL CITRATE (PF) 250 MCG/5ML IJ SOLN
INTRAMUSCULAR | Status: DC | PRN
  Administered 2024-06-24: 25 ug via INTRAVENOUS
  Administered 2024-06-24: 50 ug via INTRAVENOUS
  Administered 2024-06-24: 25 ug via INTRAVENOUS

## 2024-06-24 MED ORDER — OXYCODONE HCL 5 MG/5ML PO SOLN: 5.0000 mg | Freq: Once | ORAL | Status: DC | PRN

## 2024-06-24 MED ORDER — ACETAMINOPHEN 10 MG/ML IV SOLN
1000.0000 mg | Freq: Once | INTRAVENOUS | Status: DC | PRN
  Administered 2024-06-24: 1000 mg via INTRAVENOUS

## 2024-06-24 MED ORDER — DROPERIDOL 2.5 MG/ML IJ SOLN: 0.6250 mg | Freq: Once | INTRAMUSCULAR | Status: DC | PRN

## 2024-06-24 MED ORDER — MIDAZOLAM HCL (PF) 2 MG/2ML IJ SOLN
INTRAMUSCULAR | Status: DC | PRN
  Administered 2024-06-24: 2 mg via INTRAVENOUS

## 2024-06-24 MED ORDER — FENTANYL CITRATE (PF) 50 MCG/ML IJ SOSY
PREFILLED_SYRINGE | INTRAMUSCULAR | Status: AC
  Filled 2024-06-24: qty 1

## 2024-06-24 MED ORDER — KETOROLAC TROMETHAMINE 10 MG PO TABS
10.0000 mg | ORAL_TABLET | Freq: Four times a day (QID) | ORAL | 0 refills | Status: DC | PRN
  Filled 2024-06-24: qty 20, 5d supply, fill #0

## 2024-06-24 MED ORDER — CEPHALEXIN 500 MG PO CAPS
500.0000 mg | ORAL_CAPSULE | Freq: Two times a day (BID) | ORAL | 0 refills | Status: AC
  Filled 2024-06-24: qty 6, 3d supply, fill #0

## 2024-06-24 MED ORDER — MIDAZOLAM HCL 2 MG/2ML IJ SOLN
INTRAMUSCULAR | Status: AC
  Filled 2024-06-24: qty 2

## 2024-06-24 MED ORDER — DEXAMETHASONE SOD PHOSPHATE PF 10 MG/ML IJ SOLN
INTRAMUSCULAR | Status: DC | PRN
  Administered 2024-06-24: 10 mg via INTRAVENOUS

## 2024-06-24 MED ORDER — PHENAZOPYRIDINE HCL 200 MG PO TABS
200.0000 mg | ORAL_TABLET | Freq: Three times a day (TID) | ORAL | 0 refills | Status: AC | PRN
  Filled 2024-06-24: qty 30, 10d supply, fill #0

## 2024-06-24 MED ORDER — CEFAZOLIN SODIUM-DEXTROSE 2-4 GM/100ML-% IV SOLN
2.0000 g | INTRAVENOUS | Status: AC
  Administered 2024-06-24: 2 g via INTRAVENOUS
  Filled 2024-06-24: qty 100

## 2024-06-24 MED ORDER — LACTATED RINGERS IV SOLN: INTRAVENOUS | Status: DC | PRN

## 2024-06-24 MED ORDER — PROPOFOL 10 MG/ML IV BOLUS
INTRAVENOUS | Status: DC | PRN
  Administered 2024-06-24: 170 mg via INTRAVENOUS

## 2024-06-24 MED ORDER — FENTANYL CITRATE (PF) 50 MCG/ML IJ SOSY
25.0000 ug | PREFILLED_SYRINGE | INTRAMUSCULAR | Status: DC | PRN
  Administered 2024-06-24 (×2): 50 ug via INTRAVENOUS

## 2024-06-24 MED ORDER — METOPROLOL SUCCINATE ER 25 MG PO TB24
50.0000 mg | ORAL_TABLET | Freq: Once | ORAL | Status: AC
  Administered 2024-06-24: 50 mg via ORAL
  Filled 2024-06-24: qty 2

## 2024-06-24 MED ORDER — PROPOFOL 10 MG/ML IV BOLUS
INTRAVENOUS | Status: AC
  Filled 2024-06-24: qty 20

## 2024-06-24 MED ORDER — OXYCODONE HCL 5 MG PO TABS: 5.0000 mg | ORAL_TABLET | Freq: Once | ORAL | Status: DC | PRN

## 2024-06-24 MED ORDER — FENTANYL CITRATE (PF) 100 MCG/2ML IJ SOLN
INTRAMUSCULAR | Status: AC
  Filled 2024-06-24: qty 2

## 2024-06-24 MED ORDER — ONDANSETRON HCL 4 MG/2ML IJ SOLN
INTRAMUSCULAR | Status: DC | PRN
  Administered 2024-06-24: 4 mg via INTRAVENOUS

## 2024-06-24 MED ORDER — LIDOCAINE 2% (20 MG/ML) 5 ML SYRINGE
INTRAMUSCULAR | Status: DC | PRN
  Administered 2024-06-24: 40 mg via INTRAVENOUS

## 2024-06-24 MED ORDER — SODIUM CHLORIDE 0.9 % IR SOLN
Status: DC | PRN
  Administered 2024-06-24: 3000 mL

## 2024-06-24 SURGICAL SUPPLY — 19 items
BAG URO CATCHER STRL LF (MISCELLANEOUS) ×1 IMPLANT
BASKET ZERO TIP NITINOL 2.4FR (BASKET) IMPLANT
CATH URETL OPEN 5X70 (CATHETERS) ×1 IMPLANT
CLOTH BEACON ORANGE TIMEOUT ST (SAFETY) ×1 IMPLANT
EXTRACTOR STONE NITINOL NGAGE (UROLOGICAL SUPPLIES) IMPLANT
FIBER LASER MOSES 200 DFL (Laser) IMPLANT
GLOVE SURG LX STRL 8.0 MICRO (GLOVE) ×1 IMPLANT
GOWN STRL SURGICAL XL XLNG (GOWN DISPOSABLE) ×1 IMPLANT
GUIDEWIRE STR DUAL SENSOR (WIRE) IMPLANT
GUIDEWIRE ZIPWRE .038 STRAIGHT (WIRE) ×1 IMPLANT
KIT TURNOVER KIT A (KITS) ×1 IMPLANT
MANIFOLD NEPTUNE II (INSTRUMENTS) ×1 IMPLANT
PACK CYSTO (CUSTOM PROCEDURE TRAY) ×1 IMPLANT
SHEATH NAVIGATOR HD 11/13X28 (SHEATH) IMPLANT
SHEATH NAVIGATOR HD 11/13X36 (SHEATH) IMPLANT
STENT URET 6FRX24 CONTOUR (STENTS) IMPLANT
STENT URET 6FRX26 CONTOUR (STENTS) IMPLANT
TUBING CONNECTING 10 (TUBING) ×1 IMPLANT
TUBING UROLOGY SET (TUBING) ×1 IMPLANT

## 2024-06-24 NOTE — Anesthesia Preprocedure Evaluation (Signed)
"                                    Anesthesia Evaluation  Patient identified by MRN, date of birth, ID band Patient awake    Reviewed: Allergy & Precautions, NPO status , Patient's Chart, lab work & pertinent test results  Airway Mallampati: I  TM Distance: >3 FB Neck ROM: Full    Dental  (+) Teeth Intact, Dental Advisory Given   Pulmonary    breath sounds clear to auscultation       Cardiovascular hypertension, Pt. on home beta blockers + pacemaker  Rhythm:Regular Rate:Normal     Neuro/Psych  Headaches PSYCHIATRIC DISORDERS Anxiety Depression     Neuromuscular disease CVA    GI/Hepatic Neg liver ROS,GERD  Medicated,,  Endo/Other  diabetes, Type 2Hypothyroidism    Renal/GU Renal disease     Musculoskeletal negative musculoskeletal ROS (+)    Abdominal   Peds  Hematology negative hematology ROS (+)   Anesthesia Other Findings   Reproductive/Obstetrics                              Anesthesia Physical Anesthesia Plan  ASA: 2  Anesthesia Plan: General   Post-op Pain Management: Tylenol  PO (pre-op)*   Induction: Intravenous  PONV Risk Score and Plan: 4 or greater and Ondansetron , Dexamethasone , Midazolam  and Scopolamine  patch - Pre-op  Airway Management Planned: LMA  Additional Equipment: None  Intra-op Plan:   Post-operative Plan: Extubation in OR  Informed Consent: I have reviewed the patients History and Physical, chart, labs and discussed the procedure including the risks, benefits and alternatives for the proposed anesthesia with the patient or authorized representative who has indicated his/her understanding and acceptance.     Dental advisory given  Plan Discussed with: CRNA  Anesthesia Plan Comments:         Anesthesia Quick Evaluation  "

## 2024-06-24 NOTE — H&P (Signed)
 Urology Preoperative H&P   Chief Complaint: Left ureteral stone  History of Present Illness: Carmen [name] is a 43 y.o. female with a long history of kidney stones who initially presented to the emergency department on 06/21/2024 due to acute left-sided flank pain.  She had a CT stone study at that time that revealed a left UVJ stone measuring approximately 5 mm with concomitant mild left-sided hydronephrosis.  Despite a trial of passage, the patient continues to have intermittent episodes of left lower quadrant/flank pain.  She denies interval fever/chills, hematuria or dysuria.    Past Medical History:  Diagnosis Date   Back pain    Costochondritis 09/13/2010   GERD (gastroesophageal reflux disease)    Heart palpitations    long history of    Heartburn    History of chest pain    History of fatigue    History of kidney stones    History of migraine headaches    History of tilt table evaluation    IMPRESSION: Positive tilt table study with significant cardioinhibitory  response.   Hyperlipidemia    Hypertension    Hypothyroidism    Kidney stones    history of   migraine    Neurocardiogenic syncope    Palpitations    Pericarditis 09/13/2010   Presence of permanent cardiac pacemaker    PSVT (paroxysmal supraventricular tachycardia)    Right ureteral calculus    Sleeping difficulties    Stress    Stroke (HCC) 06/15/2019   no residual deficits, tpa given at time of stroke   Stroke [facility])    tia jan 2022   SVD (spontaneous vaginal delivery)    x 1   Syncope and collapse    neurocardiogenic syncopy started age 20   Wears glasses     Past Surgical History:  Procedure Laterality Date   ANTERIOR AND POSTERIOR REPAIR N/A 11/14/2017   Procedure: ANTERIOR (CYSTOCELE) AND POSTERIOR REPAIR (RECTOCELE);  Surgeon: Darcel Pool, MD;  Location: [facility];  Service: Gynecology;  Laterality: N/A;   BILATERAL SALPINGECTOMY Bilateral 11/14/2017   Procedure: BILATERAL SALPINGECTOMY;   Surgeon: Darcel Pool, MD;  Location: [facility];  Service: Gynecology;  Laterality: Bilateral;   BLADDER SUSPENSION Bilateral 11/14/2017   Procedure: TRANSVAGINAL TAPE (TVT) PROCEDURE;  Surgeon: Henry Slough, MD;  Location: [facility];  Service: Gynecology;  Laterality: Bilateral;   CHOLECYSTECTOMY  11/2007   Cholecystitis - Laparoscopic cholecystectomy with intraoperative  cholangiogram.   CYSTOSCOPY N/A 11/14/2017   Procedure: CYSTOSCOPY;  Surgeon: Henry Slough, MD;  Location: [facility];  Service: Gynecology;  Laterality: N/A;   CYSTOSCOPY WITH RETROGRADE PYELOGRAM, URETEROSCOPY AND STENT PLACEMENT Right 06/25/2019   Procedure: CYSTOSCOPY WITH RETROGRADE PYELOGRAM, AND RIGHT  STENT PLACEMENT;  Surgeon: Elisabeth Valli BIRCH, MD;  Location: [facility];  Service: Urology;  Laterality: Right;   CYSTOSCOPY WITH RETROGRADE PYELOGRAM, URETEROSCOPY AND STENT PLACEMENT Right 07/07/2019   Procedure: CYSTOSCOPY , URETEROSCOPY AND STENT REPLACEMENT;  Surgeon: Elisabeth Valli BIRCH, MD;  Location: [facility];  Service: Urology;  Laterality: Right;   CYSTOSCOPY WITH RETROGRADE PYELOGRAM, URETEROSCOPY AND STENT PLACEMENT Right 03/23/2021   Procedure: CYSTOSCOPY WITH RETROGRADE PYELOGRAM, URETEROSCOPY AND STENT PLACEMENT;  Surgeon: Matilda Senior, MD;  Location: [facility];  Service: Urology;  Laterality: Right;   CYSTOSCOPY/URETEROSCOPY/HOLMIUM LASER/STENT PLACEMENT Left 07/05/2022   Procedure: CYSTOSCOPY LEFT RETROGRADE PYELOGRAM URETEROSCOPY/HOLMIUM LASER/STENT PLACEMENT;  Surgeon: Nieves Cough, MD;  Location: [facility];  Service: Urology;  Laterality: Left;   HOLMIUM LASER APPLICATION Right 07/07/2019  Procedure: HOLMIUM LASER APPLICATION;  Surgeon: Elisabeth Valli BIRCH, MD;  Location: [facility];  Service: Urology;  Laterality: Right;   HOLMIUM LASER APPLICATION Right 03/23/2021   Procedure: HOLMIUM LASER APPLICATION;  Surgeon: Matilda Senior, MD;  Location: [facility];  Service: Urology;  Laterality: Right;   PACEMAKER INSERTION  2016   Tilt Table Study  07/17/2005   Positive tilt table study with significant cardioinhibitory  response.   VAGINAL HYSTERECTOMY N/A 11/14/2017   Procedure: HYSTERECTOMY VAGINAL;  Surgeon: Darcel Pool, MD;  Location: [facility];  Service: Gynecology;  Laterality: N/A;   WISDOM TOOTH EXTRACTION     age 51    Allergies: Allergies[1]  Family History  Problem Relation Age of Onset   Hypertension Mother    Heart disease Mother    Cancer Mother        breast   Breast cancer Mother 18   Diabetes Mother    Hyperlipidemia Mother    Thyroid  disease Mother    Depression Mother    Anxiety disorder Mother    Bipolar disorder Mother    Liver disease Mother    Alcoholism Mother    Obesity Mother    Heart attack Father    Hyperlipidemia Father    Hepatitis C Father    Diabetes Father    Hypertension Father    Heart disease Father    Sudden Cardiac Death Father    Depression Father    Anxiety disorder Father    Liver disease Father    Alcoholism Father    Drug abuse Father    Obesity Father    Cancer Maternal Aunt        Breast   Breast cancer Maternal Aunt 41   Breast cancer Maternal Grandmother 70    Social History:  reports that she has never smoked. She has never used smokeless tobacco. She reports current alcohol use. She reports that she does not use drugs.  ROS: A complete review of systems was performed.  All systems are negative except for pertinent findings as noted.  Physical Exam:  Vital signs in last 24 hours: Temp:  [98 F (36.7 C)] 98 F (36.7 C) (12/31 1351) Pulse Rate:  [81] 81 (12/31 1432) Resp:  [18] 18 (12/31 1351) BP: (135)/(77) 135/77 (12/31 1432) SpO2:  [99 %] 99 % (12/31 1351) Weight:  [84.4 kg] 84.4 kg (12/31 1402) Constitutional:  Alert and oriented, No acute distress Cardiovascular: Regular rate and rhythm, No JVD Respiratory: Normal respiratory effort, Lungs  clear bilaterally GI: Abdomen is soft, nontender, nondistended, no abdominal masses GU: No CVA tenderness Lymphatic: No lymphadenopathy Neurologic: Grossly intact, no focal deficits Psychiatric: Normal mood and affect  Laboratory Data:  Recent Labs    06/21/24 1507 06/23/24 1601  WBC 10.9* 6.9  HGB 14.9 14.5  HCT 42.5 41.5  PLT 217 235    Recent Labs    06/21/24 1507 06/23/24 1148  NA 137 138  K 4.3 4.7  CL 104 104  GLUCOSE 89 87  BUN 13 11  CALCIUM  9.2 9.6  CREATININE 0.73 0.71     Results for orders placed or performed in visit on 06/23/24 (from the past 24 hours)  CBC with Differential     Status: None   Collection Time: 06/23/24  4:01 PM  Result Value Ref Range   WBC 6.9 3.8 - 10.8 Thousand/uL   RBC 4.50 3.80 - 5.10 Million/uL   Hemoglobin 14.5 11.7 - 15.5 g/dL  HCT 41.5 35.9 - 46.0 %   MCV 92.2 81.4 - 101.7 fL   MCH 32.2 27.0 - 33.0 pg   MCHC 34.9 31.6 - 35.4 g/dL   RDW 86.9 88.9 - 84.9 %   Platelets 235 140 - 400 Thousand/uL   MPV 11.4 7.5 - 12.5 fL   Neutro Abs 4,188 1,500 - 7,800 cells/uL   Absolute Lymphocytes 1,925 850 - 3,900 cells/uL   Absolute Monocytes 497 200 - 950 cells/uL   Eosinophils Absolute 262 15 - 500 cells/uL   Basophils Absolute 28 0 - 200 cells/uL   Neutrophils Relative % 60.7 %   Total Lymphocyte 27.9 %   Monocytes Relative 7.2 %   Eosinophils Relative 3.8 %   Basophils Relative 0.4 %  Extra Specimen     Status: None   Collection Time: 06/23/24  4:01 PM  Result Value Ref Range   Extra tube recieved     Specimen type recieved Required Documents    No results found for this or any previous visit (from the past 240 hours).  Renal Function: Recent Labs    06/21/24 1507 06/23/24 1148  CREATININE 0.73 0.71   Estimated Creatinine Clearance: 89.4 mL/min (by C-G formula based on SCr of 0.71 mg/dL).  Radiologic Imaging: No results found.  I independently reviewed the above imaging studies.  Assessment and Plan Carmen [name] is a 43 y.o. female with a 5 mm left UVJ stone  The risks, benefits and alternatives of cystoscopy with LEFT ureteroscopy, laser lithotripsy and ureteral stent placement was discussed the patient.  Risks included, but are not limited to: bleeding, urinary tract infection, ureteral injury/avulsion, ureteral stricture formation, retained stone fragments, the possibility that multiple surgeries may be required to treat the stone(s), MI, stroke, PE and the inherent risks of general anesthesia.  The patient voices understanding and wishes to proceed.      Lonni Han, MD 06/24/2024, 2:57 PM  [facility] Pager: [phone]     [1]  Allergies Allergen Reactions   Tape Hives    Adhesive tape - tegaderm/paper tape ok   Prochlorperazine Other (See Comments)    Akathisia with compazine in ED on 05/17/19.  Resolved with repeat dose of benadryl .      Reglan  [Metoclopramide ] Other (See Comments)    flinging legs and not feeling right.

## 2024-06-24 NOTE — Anesthesia Procedure Notes (Signed)
 Procedure Name: LMA Insertion Date/Time: 06/24/2024 3:08 PM  Performed by: Cena Epps, CRNAPre-anesthesia Checklist: Patient identified, Emergency Drugs available, Suction available and Patient being monitored Patient Re-evaluated:Patient Re-evaluated prior to induction Oxygen Delivery Method: Circle System Utilized Preoxygenation: Pre-oxygenation with 100% oxygen Induction Type: IV induction Ventilation: Mask ventilation without difficulty LMA: LMA inserted LMA Size: 4.0 Number of attempts: 1 Airway Equipment and Method: Bite block Placement Confirmation: positive ETCO2 Tube secured with: Tape Dental Injury: Teeth and Oropharynx as per pre-operative assessment

## 2024-06-24 NOTE — Transfer of Care (Signed)
 Immediate Anesthesia Transfer of Care Note  Patient: Carmen [name]  Procedure(s) Performed: CYSTOSCOPY/URETEROSCOPY/HOLMIUM LASER/STENT PLACEMENT (Left)  Patient Location: PACU  Anesthesia Type:General  Level of Consciousness: awake and patient cooperative  Airway & Oxygen Therapy: Patient Spontanous Breathing and Patient connected to face mask oxygen  Post-op Assessment: Report given to RN and Post -op Vital signs reviewed and stable  Post vital signs: Reviewed and stable  Last Vitals:  Vitals Value Taken Time  BP 113/70 06/24/24 15:55  Temp 36.6 C 06/24/24 15:55  Pulse 79 06/24/24 15:57  Resp 20 06/24/24 15:57  SpO2 98 % 06/24/24 15:57  Vitals shown include unfiled device data.  Last Pain:  Vitals:   06/24/24 1351  TempSrc: Oral         Complications: No notable events documented.

## 2024-06-24 NOTE — Op Note (Signed)
 Operative Note  Preoperative diagnosis:  1.  5 mm left UVJ stone  Postoperative diagnosis: 1.  5 mm left UVJ stone  Procedure(s): 1.  Cystoscopy with left ureteroscopy, holmium laser lithotripsy and left JJ stent placement  Surgeon: Lonni Han, MD  Assistants:  None  Anesthesia:  General  Complications:  None  EBL: Less than 5 mL  Specimens: 1.  Left ureteral stone fragments  Drains/Catheters: 1.  Left 6 French, 24 cm JJ stent with tether  Intraoperative findings:   Obstructing 5 mm left distal ureteral stone  No intravesical or urethral abnormalities were seen during cystoscopy  Indication:  Carmen [name] is a 43 y.o. female with a 5 mm left UVJ stone.  She has been consented for the above procedures, voices understanding and wishes to proceed.  Description of procedure:  After informed consent was obtained, the patient was brought to the operating room and general LMA anesthesia was administered. The patient was then placed in the dorsolithotomy position and prepped and draped in the usual sterile fashion. A timeout was performed. A 21 French rigid cystoscope was then inserted into the urethral meatus and advanced into the bladder under direct vision. A complete bladder survey revealed no intravesical pathology.  A Glidewire was then advanced up the left ureter to the left renal pelvis, and fluoroscopic guidance.  The rigid cystoscope was then removed and a semirigid ureteroscope was then inserted into the bladder and up the left ureter where her obstructing stone was identified.  A 200 m holmium laser was then used to fracture the stone into numerous smaller pieces.  A ZeroTip basket was then used to extract all stone fragments from the lumen of the left ureter.  The semirigid ureteroscope was then removed, leaving the Glidewire in place.  A 6 French, 24 cm JJ stent was then placed over the wire and into good position within the left collecting system, confirming  placement via fluoroscopy.  The tether the stent was left intact and tucked into the vaginal vault.  The patient's bladder was drained.  She tolerated the procedure well and was transferred to the postanesthesia in stable condition.  Plan: The patient has been instructed to remove her ureteral stent in 72 hours

## 2024-06-25 ENCOUNTER — Encounter (HOSPITAL_COMMUNITY): Payer: Self-pay | Admitting: Urology

## 2024-06-26 ENCOUNTER — Other Ambulatory Visit: Payer: Self-pay

## 2024-06-26 ENCOUNTER — Other Ambulatory Visit: Payer: Self-pay | Admitting: Urology

## 2024-06-26 ENCOUNTER — Other Ambulatory Visit (HOSPITAL_BASED_OUTPATIENT_CLINIC_OR_DEPARTMENT_OTHER): Payer: Self-pay

## 2024-06-26 MED ORDER — OXYBUTYNIN CHLORIDE 5 MG PO TABS
5.0000 mg | ORAL_TABLET | Freq: Three times a day (TID) | ORAL | 0 refills | Status: AC | PRN
  Filled 2024-06-26: qty 20, 7d supply, fill #0

## 2024-06-26 MED ORDER — OXYCODONE HCL 5 MG PO TABS
5.0000 mg | ORAL_TABLET | ORAL | 0 refills | Status: AC | PRN
  Filled 2024-06-26: qty 15, 3d supply, fill #0

## 2024-06-26 NOTE — Anesthesia Postprocedure Evaluation (Signed)
"   Anesthesia Post Note  Patient: Carmen [name]  Procedure(s) Performed: CYSTOSCOPY/URETEROSCOPY/HOLMIUM LASER/STENT PLACEMENT (Left)     Patient location during evaluation: PACU Anesthesia Type: General Level of consciousness: awake and alert Pain management: pain level controlled Vital Signs Assessment: post-procedure vital signs reviewed and stable Respiratory status: spontaneous breathing, nonlabored ventilation, respiratory function stable and patient connected to nasal cannula oxygen Cardiovascular status: blood pressure returned to baseline and stable Postop Assessment: no apparent nausea or vomiting Anesthetic complications: no   No notable events documented.  Last Vitals:  Vitals:   06/24/24 1712 06/24/24 1730  BP: (!) 119/91 (!) 130/98  Pulse: 81 65  Resp: 14 15  Temp:    SpO2: 100% 99%    Last Pain:  Vitals:   06/24/24 1730  TempSrc:   PainSc: 2    Pain Goal:                   Carmen [name]      "

## 2024-07-09 ENCOUNTER — Other Ambulatory Visit (HOSPITAL_BASED_OUTPATIENT_CLINIC_OR_DEPARTMENT_OTHER): Payer: Self-pay

## 2024-07-09 MED ORDER — ZEPBOUND 5 MG/0.5ML ~~LOC~~ SOAJ
5.0000 mg | SUBCUTANEOUS | 0 refills | Status: AC
  Filled 2024-07-09: qty 2, 28d supply, fill #0

## 2024-07-19 ENCOUNTER — Other Ambulatory Visit: Payer: Self-pay | Admitting: Family Medicine

## 2024-07-20 ENCOUNTER — Other Ambulatory Visit: Payer: Self-pay

## 2024-07-20 ENCOUNTER — Other Ambulatory Visit (HOSPITAL_BASED_OUTPATIENT_CLINIC_OR_DEPARTMENT_OTHER): Payer: Self-pay

## 2024-07-20 MED ORDER — VITAMIN D (ERGOCALCIFEROL) 1.25 MG (50000 UNIT) PO CAPS
50000.0000 [IU] | ORAL_CAPSULE | ORAL | 0 refills | Status: AC
  Filled 2024-07-20: qty 12, 84d supply, fill #0

## 2024-07-23 ENCOUNTER — Other Ambulatory Visit: Payer: Self-pay

## 2024-07-27 ENCOUNTER — Other Ambulatory Visit (HOSPITAL_BASED_OUTPATIENT_CLINIC_OR_DEPARTMENT_OTHER): Payer: Self-pay

## 2024-07-29 ENCOUNTER — Other Ambulatory Visit (HOSPITAL_BASED_OUTPATIENT_CLINIC_OR_DEPARTMENT_OTHER): Payer: Self-pay

## 2024-07-29 MED ORDER — NITROFURANTOIN MONOHYD MACRO 100 MG PO CAPS
100.0000 mg | ORAL_CAPSULE | Freq: Two times a day (BID) | ORAL | 0 refills | Status: AC
  Filled 2024-07-29: qty 10, 5d supply, fill #0

## 2024-07-31 ENCOUNTER — Other Ambulatory Visit (HOSPITAL_BASED_OUTPATIENT_CLINIC_OR_DEPARTMENT_OTHER): Payer: Self-pay

## 2024-07-31 MED ORDER — KETOROLAC TROMETHAMINE 10 MG PO TABS
10.0000 mg | ORAL_TABLET | Freq: Three times a day (TID) | ORAL | 1 refills | Status: AC | PRN
  Filled 2024-07-31: qty 15, 5d supply, fill #0

## 2024-07-31 MED ORDER — ESTRADIOL 0.01 % VA CREA
1.0000 | TOPICAL_CREAM | VAGINAL | 0 refills | Status: AC
  Filled 2024-07-31: qty 42.5, 30d supply, fill #0

## 2024-08-25 ENCOUNTER — Ambulatory Visit: Admitting: Family Medicine

## 2024-09-10 ENCOUNTER — Ambulatory Visit: Admitting: Family Medicine
